# Patient Record
Sex: Female | Born: 1960
Health system: Southern US, Community
[De-identification: ages and names within clinical notes are randomized; demographics above are authoritative.]

## PROBLEM LIST (undated history)

## (undated) DIAGNOSIS — N200 Calculus of kidney: Secondary | ICD-10-CM

## (undated) DIAGNOSIS — B019 Varicella without complication: Secondary | ICD-10-CM

## (undated) DIAGNOSIS — E119 Type 2 diabetes mellitus without complications: Secondary | ICD-10-CM

## (undated) DIAGNOSIS — I999 Unspecified disorder of circulatory system: Secondary | ICD-10-CM

## (undated) DIAGNOSIS — D649 Anemia, unspecified: Secondary | ICD-10-CM

## (undated) DIAGNOSIS — N920 Excessive and frequent menstruation with regular cycle: Secondary | ICD-10-CM

## (undated) DIAGNOSIS — E7800 Pure hypercholesterolemia, unspecified: Secondary | ICD-10-CM

## (undated) DIAGNOSIS — Z22322 Carrier or suspected carrier of Methicillin resistant Staphylococcus aureus: Secondary | ICD-10-CM

## (undated) DIAGNOSIS — E611 Iron deficiency: Secondary | ICD-10-CM

## (undated) DIAGNOSIS — G4730 Sleep apnea, unspecified: Secondary | ICD-10-CM

## (undated) DIAGNOSIS — M549 Dorsalgia, unspecified: Secondary | ICD-10-CM

## (undated) DIAGNOSIS — I10 Essential (primary) hypertension: Secondary | ICD-10-CM

## (undated) DIAGNOSIS — IMO0002 Reserved for concepts with insufficient information to code with codable children: Secondary | ICD-10-CM

## (undated) DIAGNOSIS — N289 Disorder of kidney and ureter, unspecified: Secondary | ICD-10-CM

## (undated) DIAGNOSIS — R110 Nausea: Secondary | ICD-10-CM

## (undated) DIAGNOSIS — J40 Bronchitis, not specified as acute or chronic: Secondary | ICD-10-CM

## (undated) DIAGNOSIS — R1013 Epigastric pain: Secondary | ICD-10-CM

## (undated) DIAGNOSIS — M25569 Pain in unspecified knee: Secondary | ICD-10-CM

## (undated) DIAGNOSIS — R9431 Abnormal electrocardiogram [ECG] [EKG]: Secondary | ICD-10-CM

## (undated) DIAGNOSIS — G5600 Carpal tunnel syndrome, unspecified upper limb: Secondary | ICD-10-CM

## (undated) HISTORY — DX: Unspecified disorder of circulatory system: I99.9

## (undated) HISTORY — DX: Epigastric pain: R10.13

## (undated) HISTORY — DX: Sleep apnea, unspecified: G47.30

## (undated) HISTORY — DX: Nausea: R11.0

## (undated) HISTORY — PX: BREAST BIOPSY: SHX20

## (undated) HISTORY — DX: Reserved for concepts with insufficient information to code with codable children: IMO0002

## (undated) HISTORY — PX: ABDOMINAL HYSTERECTOMY: SHX81

## (undated) HISTORY — DX: Varicella without complication: B01.9

## (undated) HISTORY — DX: Disorder of kidney and ureter, unspecified: N28.9

## (undated) HISTORY — DX: Carrier or suspected carrier of methicillin resistant Staphylococcus aureus: Z22.322

## (undated) HISTORY — DX: Bronchitis, not specified as acute or chronic: J40

## (undated) HISTORY — PX: OTHER SURGICAL HISTORY: SHX169

## (undated) HISTORY — DX: Dorsalgia, unspecified: M54.9

## (undated) HISTORY — DX: Excessive and frequent menstruation with regular cycle: N92.0

## (undated) HISTORY — DX: Abnormal electrocardiogram (ECG) (EKG): R94.31

## (undated) HISTORY — DX: Carpal tunnel syndrome, unspecified upper limb: G56.00

## (undated) HISTORY — DX: Pain in unspecified knee: M25.569

## (undated) HISTORY — DX: Iron deficiency: E61.1

## (undated) HISTORY — DX: Type 2 diabetes mellitus without complications: E11.9

## (undated) HISTORY — DX: Essential (primary) hypertension: I10

## (undated) HISTORY — DX: Calculus of kidney: N20.0

## (undated) HISTORY — DX: Anemia, unspecified: D64.9

## (undated) HISTORY — DX: Pure hypercholesterolemia, unspecified: E78.00

---

## 1995-05-14 HISTORY — PX: LITHOTRIPSY: SUR834

## 1995-05-14 HISTORY — PX: COLONOSCOPY: SHX174

## 2004-04-27 ENCOUNTER — Emergency Department: Payer: Self-pay | Admitting: Emergency Medicine

## 2004-08-14 ENCOUNTER — Emergency Department: Payer: Self-pay | Admitting: Emergency Medicine

## 2004-10-22 ENCOUNTER — Emergency Department: Payer: Self-pay | Admitting: Emergency Medicine

## 2004-10-24 ENCOUNTER — Emergency Department: Payer: Self-pay | Admitting: Emergency Medicine

## 2005-11-02 ENCOUNTER — Emergency Department: Payer: Self-pay | Admitting: General Practice

## 2006-06-22 ENCOUNTER — Emergency Department: Payer: Self-pay | Admitting: Emergency Medicine

## 2007-01-30 ENCOUNTER — Other Ambulatory Visit: Payer: Self-pay

## 2007-01-30 ENCOUNTER — Emergency Department: Payer: Self-pay | Admitting: Emergency Medicine

## 2007-05-14 HISTORY — PX: PARTIAL HYSTERECTOMY: SHX80

## 2007-05-26 DIAGNOSIS — N200 Calculus of kidney: Secondary | ICD-10-CM | POA: Insufficient documentation

## 2007-06-17 DIAGNOSIS — D509 Iron deficiency anemia, unspecified: Secondary | ICD-10-CM | POA: Insufficient documentation

## 2007-12-29 ENCOUNTER — Emergency Department: Payer: Self-pay | Admitting: Emergency Medicine

## 2008-01-15 ENCOUNTER — Ambulatory Visit: Payer: Self-pay | Admitting: Family Medicine

## 2008-03-28 ENCOUNTER — Ambulatory Visit: Payer: Self-pay | Admitting: Family Medicine

## 2008-03-28 DIAGNOSIS — M25569 Pain in unspecified knee: Secondary | ICD-10-CM | POA: Insufficient documentation

## 2008-07-12 DIAGNOSIS — I10 Essential (primary) hypertension: Secondary | ICD-10-CM | POA: Insufficient documentation

## 2008-08-02 ENCOUNTER — Ambulatory Visit: Payer: Self-pay | Admitting: Obstetrics and Gynecology

## 2008-08-10 DIAGNOSIS — N399 Disorder of urinary system, unspecified: Secondary | ICD-10-CM | POA: Insufficient documentation

## 2008-08-16 ENCOUNTER — Ambulatory Visit: Payer: Self-pay | Admitting: Obstetrics and Gynecology

## 2008-08-21 ENCOUNTER — Inpatient Hospital Stay: Payer: Self-pay | Admitting: Surgery

## 2008-09-15 DIAGNOSIS — T8149XA Infection following a procedure, other surgical site, initial encounter: Secondary | ICD-10-CM | POA: Insufficient documentation

## 2008-09-15 HISTORY — DX: Infection following a procedure, other surgical site, initial encounter: T81.49XA

## 2008-09-23 ENCOUNTER — Ambulatory Visit: Payer: Self-pay | Admitting: Urology

## 2008-11-12 ENCOUNTER — Inpatient Hospital Stay: Payer: Self-pay | Admitting: Internal Medicine

## 2008-11-30 ENCOUNTER — Ambulatory Visit: Payer: Self-pay | Admitting: Urology

## 2008-12-01 ENCOUNTER — Inpatient Hospital Stay: Payer: Self-pay | Admitting: Surgery

## 2010-05-13 HISTORY — PX: HERNIA REPAIR: SHX51

## 2010-05-13 HISTORY — PX: COLOSTOMY TAKEDOWN: SHX5783

## 2010-11-11 ENCOUNTER — Emergency Department (HOSPITAL_COMMUNITY)
Admission: EM | Admit: 2010-11-11 | Discharge: 2010-11-11 | Disposition: A | Discharge status: Home / Self Care | Payer: BC Managed Care – PPO | Attending: Emergency Medicine | Admitting: Emergency Medicine

## 2010-11-11 ENCOUNTER — Emergency Department (HOSPITAL_COMMUNITY): Payer: BC Managed Care – PPO

## 2010-11-11 DIAGNOSIS — M25469 Effusion, unspecified knee: Secondary | ICD-10-CM | POA: Insufficient documentation

## 2010-11-11 DIAGNOSIS — M25569 Pain in unspecified knee: Secondary | ICD-10-CM | POA: Insufficient documentation

## 2010-11-11 DIAGNOSIS — M1710 Unilateral primary osteoarthritis, unspecified knee: Secondary | ICD-10-CM | POA: Insufficient documentation

## 2011-03-14 ENCOUNTER — Ambulatory Visit (INDEPENDENT_AMBULATORY_CARE_PROVIDER_SITE_OTHER): Payer: BC Managed Care – PPO | Admitting: Cardiovascular Disease

## 2011-03-14 VITALS — BP 152/94 | HR 83 | Ht 69.0 in | Wt 234.0 lb

## 2011-03-14 DIAGNOSIS — I10 Essential (primary) hypertension: Secondary | ICD-10-CM | POA: Insufficient documentation

## 2011-03-14 DIAGNOSIS — R9431 Abnormal electrocardiogram [ECG] [EKG]: Secondary | ICD-10-CM

## 2011-03-14 DIAGNOSIS — E669 Obesity, unspecified: Secondary | ICD-10-CM | POA: Insufficient documentation

## 2011-03-14 DIAGNOSIS — R002 Palpitations: Secondary | ICD-10-CM | POA: Insufficient documentation

## 2011-03-14 MED ORDER — AMLODIPINE BESYLATE 10 MG PO TABS: 10.0000 mg | ORAL_TABLET | Freq: Every day | ORAL | Status: DC

## 2011-03-14 NOTE — Assessment & Plan Note (Signed)
Heart rate is mildly elevated during today's visit on clinical exam. I suggested she start the metoprolol that has already been called in. This can be titrated upwards as needed for palpitations. I suspect she is having ectopy. If she continues to have palpitations despite being on beta blockers, I suggested we could do a Holter monitor.   She denies any significant shortness of breath or chest pain with exertion, we will hold off on any stress testing.

## 2011-03-14 NOTE — Progress Notes (Signed)
Patient ID: Emily Stokes, female    DOB: 15-Apr-1961, 50 y.o.   MRN: [id number]  HPI Comments: [name] is a 50 year old woman with history of mild obesity, hypertension who presents by referral from Dr. Oswaldo Done for symptoms of hypertension, palpitations.  She reports that over the past several weeks, she has had symptoms of palpitations and fluttering that is more common with exertion. The symptoms started prior to her developing bronchitis. It seems to happen more when she is eating and after she has eaten it happens on a daily basis. She also reports having a sensation that food is sticking in her throat though this is not all the time. Periodically, the food will stick and she has to wash it down with something to drink. This seems to make her palpitations worse.  She also reports having pain in her legs bilaterally in the thigh region during the daytime as well as night. She does have tightness in the left side of her neck into her shoulder that seems to happen at night time only.  She denies any significant chest pain, no edema, no lightheadedness. She reports that her blood pressure is typically very elevated, commonly in the 150-160 range.  EKG shows normal sinus rhythm with rate 83 beats per minute with no significant ST or T wave changes Previous EKG done at [facility] yesterday does show T wave abnormality in leads V4 through V6   Outpatient Encounter Prescriptions as of 03/14/2011  Medication Sig Dispense Refill  . metoprolol tartrate (LOPRESSOR) 25 MG tablet Take 25 mg by mouth 2 (two) times daily.    HAS NOT STARTED         Review of Systems  Constitutional: Negative.   HENT: Negative.   Eyes: Negative.   Respiratory: Negative.   Cardiovascular: Positive for palpitations.  Gastrointestinal: Negative.   Musculoskeletal: Negative.        Bilateral leg pain and left posterior shoulder discomfort at night  Skin: Negative.   Neurological: Negative.     Hematological: Negative.   Psychiatric/Behavioral: Negative.   All other systems reviewed and are negative.    BP 152/94  Pulse 83  Ht 5\' 9"  (1.753 m)  Wt 234 lb (106.142 kg)  BMI 34.56 kg/m2  Physical Exam  Nursing note and vitals reviewed. Constitutional: She is oriented to person, place, and time. She appears well-developed and well-nourished.  HENT:  Head: Normocephalic.  Nose: Nose normal.  Mouth/Throat: Oropharynx is clear and moist.  Eyes: Conjunctivae are normal. Pupils are equal, round, and reactive to light.  Neck: Normal range of motion. Neck supple. No JVD present.  Cardiovascular: Normal rate, regular rhythm, S1 normal, S2 normal, normal heart sounds and intact distal pulses.  Exam reveals no gallop and no friction rub.   No murmur heard. Pulmonary/Chest: Effort normal and breath sounds normal. No respiratory distress. She has no wheezes. She has no rales. She exhibits no tenderness.  Abdominal: Soft. Bowel sounds are normal. She exhibits no distension. There is no tenderness.  Musculoskeletal: Normal range of motion. She exhibits no edema and no tenderness.  Lymphadenopathy:    She has no cervical adenopathy.  Neurological: She is alert and oriented to person, place, and time. Coordination normal.  Skin: Skin is warm and dry. No rash noted. No erythema.  Psychiatric: She has a normal mood and affect. Her behavior is normal. Judgment and thought content normal.         Assessment and Plan

## 2011-03-14 NOTE — Assessment & Plan Note (Signed)
She has not picked up her metoprolol tartrate 25 mg b.i.d.. We have suggested she start this for blood pressure as well as palpitations. This was called in by Dr. Katrinka Blazing.  I suspect her blood pressure will continue to be very elevated and we will start her on amlodipine 5 mg for the first week, titrating up to 10 mg in week #2 if her systolic pressure remains greater than 140  Her high blood pressure could be contributing to some of her symptoms of palpitations.

## 2011-03-14 NOTE — Assessment & Plan Note (Signed)
It is very interesting that her lateral T wave abnormality has resolved on today's EKG. This could be secondary to lead placement. I suggest we monitor her closely and have a low threshold for a treadmill study.

## 2011-03-14 NOTE — Patient Instructions (Signed)
You are doing well.  Please start metoprolol tartrate twice a day Please start amlodipine 1/2 pill once a day Monitor your blood pressure If the top blood pressure number is more than 140 next week, Go to a full pill of amlodipine (10 mg daily)  Please call us if you have new issues that need to be addressed before your next appt.  The office will contact you for a follow up Appt. In 2 months

## 2011-03-14 NOTE — Assessment & Plan Note (Signed)
We have encouraged continued exercise, careful diet management in an effort to lose weight. She does have a family history of diabetes.

## 2011-04-11 ENCOUNTER — Ambulatory Visit: Payer: Self-pay | Admitting: Family Medicine

## 2011-05-15 ENCOUNTER — Telehealth: Payer: Self-pay | Admitting: *Deleted

## 2011-05-15 ENCOUNTER — Ambulatory Visit: Payer: BC Managed Care – PPO | Admitting: Cardiovascular Disease

## 2011-05-15 MED ORDER — METOPROLOL TARTRATE 25 MG PO TABS: 25.0000 mg | ORAL_TABLET | Freq: Two times a day (BID) | ORAL | Status: DC

## 2011-05-15 NOTE — Telephone Encounter (Signed)
Refill sent for metoprolol tartrate.

## 2011-07-03 ENCOUNTER — Encounter: Payer: Self-pay | Admitting: *Deleted

## 2011-10-30 DIAGNOSIS — IMO0002 Reserved for concepts with insufficient information to code with codable children: Secondary | ICD-10-CM | POA: Insufficient documentation

## 2011-10-30 DIAGNOSIS — N9489 Other specified conditions associated with female genital organs and menstrual cycle: Secondary | ICD-10-CM | POA: Insufficient documentation

## 2011-12-05 DIAGNOSIS — M25519 Pain in unspecified shoulder: Secondary | ICD-10-CM | POA: Insufficient documentation

## 2011-12-05 HISTORY — DX: Pain in unspecified shoulder: M25.519

## 2012-02-24 DIAGNOSIS — K439 Ventral hernia without obstruction or gangrene: Secondary | ICD-10-CM | POA: Insufficient documentation

## 2012-03-14 ENCOUNTER — Other Ambulatory Visit: Payer: Self-pay | Admitting: Cardiovascular Disease

## 2012-03-16 ENCOUNTER — Other Ambulatory Visit: Payer: Self-pay | Admitting: *Deleted

## 2012-03-16 MED ORDER — AMLODIPINE BESYLATE 10 MG PO TABS: 10.0000 mg | ORAL_TABLET | Freq: Every day | ORAL | Status: DC

## 2012-03-16 NOTE — Telephone Encounter (Signed)
Lmtcb pt is due for 1 yr follow up needs to schedule appointment. Refill sent in for Amlodipine R-1.

## 2012-04-25 ENCOUNTER — Other Ambulatory Visit: Payer: Self-pay | Admitting: Cardiovascular Disease

## 2012-04-27 ENCOUNTER — Telehealth: Payer: Self-pay | Admitting: *Deleted

## 2012-04-27 ENCOUNTER — Other Ambulatory Visit: Payer: Self-pay | Admitting: *Deleted

## 2012-04-27 ENCOUNTER — Encounter: Payer: Self-pay | Admitting: *Deleted

## 2012-04-27 MED ORDER — METOPROLOL TARTRATE 25 MG PO TABS: 25.0000 mg | ORAL_TABLET | Freq: Two times a day (BID) | ORAL | Status: DC

## 2012-04-27 NOTE — Telephone Encounter (Signed)
Called pt to inform pt that she is overdue for a 2 month follow up. Patient has not been seen since 2012 it has been over a year. Number listed is no longer patient's number. Sent pt letter to contact office to schedule appointment in order to continue to receive refills.

## 2012-07-27 ENCOUNTER — Telehealth: Payer: Self-pay | Admitting: *Deleted

## 2012-07-27 NOTE — Telephone Encounter (Signed)
Pharmacy requesting refill for metoprolol tartrate pt has not been seen since 2012 pt is overdue for f/u appointment. Pt number listed is disconnected to schedule appointment unable to reach pt. I contacted pt husband his number is disconnected as well. Pt needs to schedule appointment.

## 2012-10-29 ENCOUNTER — Ambulatory Visit: Payer: Self-pay | Admitting: Family Medicine

## 2013-06-30 ENCOUNTER — Encounter (HOSPITAL_COMMUNITY): Payer: Self-pay | Admitting: Emergency Medicine

## 2013-06-30 DIAGNOSIS — Z79899 Other long term (current) drug therapy: Secondary | ICD-10-CM | POA: Insufficient documentation

## 2013-06-30 DIAGNOSIS — Z87448 Personal history of other diseases of urinary system: Secondary | ICD-10-CM | POA: Insufficient documentation

## 2013-06-30 DIAGNOSIS — Z87442 Personal history of urinary calculi: Secondary | ICD-10-CM | POA: Insufficient documentation

## 2013-06-30 DIAGNOSIS — M79609 Pain in unspecified limb: Secondary | ICD-10-CM | POA: Insufficient documentation

## 2013-06-30 DIAGNOSIS — Z862 Personal history of diseases of the blood and blood-forming organs and certain disorders involving the immune mechanism: Secondary | ICD-10-CM | POA: Insufficient documentation

## 2013-06-30 DIAGNOSIS — Z8742 Personal history of other diseases of the female genital tract: Secondary | ICD-10-CM | POA: Insufficient documentation

## 2013-06-30 DIAGNOSIS — Z8669 Personal history of other diseases of the nervous system and sense organs: Secondary | ICD-10-CM | POA: Insufficient documentation

## 2013-06-30 DIAGNOSIS — Z8739 Personal history of other diseases of the musculoskeletal system and connective tissue: Secondary | ICD-10-CM | POA: Insufficient documentation

## 2013-06-30 DIAGNOSIS — Z8619 Personal history of other infectious and parasitic diseases: Secondary | ICD-10-CM | POA: Insufficient documentation

## 2013-06-30 DIAGNOSIS — Z7982 Long term (current) use of aspirin: Secondary | ICD-10-CM | POA: Insufficient documentation

## 2013-06-30 DIAGNOSIS — Z8709 Personal history of other diseases of the respiratory system: Secondary | ICD-10-CM | POA: Insufficient documentation

## 2013-06-30 DIAGNOSIS — Z8639 Personal history of other endocrine, nutritional and metabolic disease: Secondary | ICD-10-CM | POA: Insufficient documentation

## 2013-06-30 DIAGNOSIS — I10 Essential (primary) hypertension: Secondary | ICD-10-CM | POA: Insufficient documentation

## 2013-06-30 NOTE — ED Notes (Signed)
Patient presents stating she has abd surgery and ever since then her legs have been giving her trouble, told she would have trouble with them feeling painful.  Now her arms are stating to bother her.

## 2013-07-01 ENCOUNTER — Emergency Department (HOSPITAL_COMMUNITY)
Admission: EM | Admit: 2013-07-01 | Discharge: 2013-07-01 | Disposition: A | Discharge status: Home / Self Care | Payer: BC Managed Care – PPO | Attending: Emergency Medicine | Admitting: Emergency Medicine

## 2013-07-01 DIAGNOSIS — R52 Pain, unspecified: Secondary | ICD-10-CM

## 2013-07-01 MED ORDER — KETOROLAC TROMETHAMINE 60 MG/2ML IM SOLN
60.0000 mg | Freq: Once | INTRAMUSCULAR | Status: AC
  Administered 2013-07-01: 60 mg via INTRAMUSCULAR
  Filled 2013-07-01: qty 2

## 2013-07-01 MED ORDER — MELOXICAM 7.5 MG PO TABS: 7.5000 mg | ORAL_TABLET | Freq: Every day | ORAL | Status: DC

## 2013-07-01 MED ORDER — LIDOCAINE 5 % EX PTCH: 1.0000 | MEDICATED_PATCH | CUTANEOUS | Status: DC

## 2013-07-01 MED ORDER — TRAMADOL HCL 50 MG PO TABS
100.0000 mg | ORAL_TABLET | Freq: Once | ORAL | Status: AC
  Administered 2013-07-01: 100 mg via ORAL
  Filled 2013-07-01: qty 2

## 2013-07-01 NOTE — ED Provider Notes (Addendum)
CSN: [id number]     Arrival date & time 06/30/13  1927 History   First MD Initiated Contact with Patient 07/01/13 0035     Chief Complaint  Patient presents with  . Leg Pain  . Arm Pain     (Consider location/radiation/quality/duration/timing/severity/associated sxs/prior Treatment) Patient is a 53 y.o. female presenting with leg pain and arm pain. The history is provided by the patient.  Leg Pain Lower extremity pain location: all 4 extremities. Injury: no   Pain details:    Quality:  Aching   Radiates to:  Does not radiate   Severity:  Moderate   Onset quality:  Gradual   Timing:  Constant   Progression:  Unchanged Chronicity:  Chronic Dislocation: no   Worsened by:  Nothing tried Ineffective treatments:  None tried Associated symptoms: no back pain, no muscle weakness and no swelling   Risk factors: no concern for non-accidental trauma   Arm Pain  Chronic pain and sees pain management.    Past Medical History  Diagnosis Date  . Bronchitis   . Hypercholesteremia   . Chicken pox   . Measles   . Excess, menstruation   . Chest pain   . Calculus, kidney   . Anemia   . Iron deficiency   . Abdominal pain, epigastric   . Circulatory disease   . Backache   . Pain in joint, lower leg   . Carpal tunnel syndrome   . Hypertension, essential, benign   . Nausea alone   . Malaise and fatigue   . Disorder of kidney and ureter   . Infected postoperative seroma   . Nonspecific abnormal electrocardiogram (ECG) (EKG)    Past Surgical History  Procedure Laterality Date  . Hernia repair    . Colonoscopy  1997  . Partial hysterectomy  2009     vaginal hysterectomy, has both ovaries  . Cesarean section    . Lithotripsy  2993    with complications, hospitalized due to these complications for 3 months   Family History  Problem Relation Age of Onset  . Hypertension Father   . Stroke Father   . Diabetes Mother   . Hypertension Mother   . Diabetes Other     sibling  .  Diabetes Other     sibling  . Diabetes Other     sibling  . Hypertension Other     sibling  . Hypertension Other     sibling  . Hypertension Other     sibling   History  Substance Use Topics  . Smoking status: Never Smoker   . Smokeless tobacco: Never Used  . Alcohol Use: No   OB History   Grav Para Term Preterm Abortions TAB SAB Ect Mult Living   5    1  1         Review of Systems  Musculoskeletal: Negative for back pain.  All other systems reviewed and are negative.      Allergies  Review of patient's allergies indicates no known allergies.  Home Medications   Current Outpatient Rx  Name  Route  Sig  Dispense  Refill  . amLODipine (NORVASC) 10 MG tablet   Oral   Take 10 mg by mouth daily.         Marland Kitchen aspirin 81 MG tablet   Oral   Take 81 mg by mouth daily.         . metoprolol tartrate (LOPRESSOR) 25 MG tablet   Oral  Take 1 tablet (25 mg total) by mouth 2 (two) times daily.   60 tablet   1   . nitroGLYCERIN (NITROSTAT) 0.4 MG SL tablet   Sublingual   Place 0.4 mg under the tongue every 5 (five) minutes as needed.            BP 160/101  Pulse 70  Temp(Src) 98.3 F (36.8 C) (Oral)  Resp 18  Ht 5\' 9"  (1.753 m)  Wt 210 lb (95.255 kg)  BMI 31.00 kg/m2  SpO2 91% Physical Exam  Constitutional: She is oriented to person, place, and time. She appears well-developed and well-nourished.  HENT:  Head: Normocephalic and atraumatic.  Mouth/Throat: Oropharynx is clear and moist.  Eyes: Conjunctivae are normal. Pupils are equal, round, and reactive to light.  Neck: Neck supple.  Cardiovascular: Normal rate, regular rhythm and intact distal pulses.   Pulmonary/Chest: Effort normal and breath sounds normal. She has no wheezes. She has no rales.  Abdominal: Soft. Bowel sounds are normal. There is no tenderness. There is no rebound and no guarding.  Musculoskeletal: Normal range of motion. She exhibits no edema and no tenderness.  Neurological: She is  alert and oriented to person, place, and time. She has normal reflexes.  Skin: Skin is warm and dry.  Psychiatric: She has a normal mood and affect.    ED Course  Procedures (including critical care time) Labs Review Labs Reviewed - No data to display Imaging Review No results found.  EKG Interpretation   None       MDM   Final diagnoses:  None   All pain is chronic and she is a pain management patient she will need to follow up for ongoing care   Jessejames Steelman K Caspar Favila-Rasch, MD 07/01/13 0307  Carlisle Beers, MD 07/01/13 3646

## 2013-12-01 ENCOUNTER — Emergency Department (HOSPITAL_COMMUNITY)
Admission: EM | Admit: 2013-12-01 | Discharge: 2013-12-01 | Disposition: A | Discharge status: Home / Self Care | Payer: BC Managed Care – PPO | Attending: Emergency Medicine | Admitting: Emergency Medicine

## 2013-12-01 ENCOUNTER — Encounter (HOSPITAL_COMMUNITY): Payer: Self-pay | Admitting: Emergency Medicine

## 2013-12-01 DIAGNOSIS — M25512 Pain in left shoulder: Secondary | ICD-10-CM

## 2013-12-01 DIAGNOSIS — Z87442 Personal history of urinary calculi: Secondary | ICD-10-CM | POA: Insufficient documentation

## 2013-12-01 DIAGNOSIS — I10 Essential (primary) hypertension: Secondary | ICD-10-CM | POA: Insufficient documentation

## 2013-12-01 DIAGNOSIS — M542 Cervicalgia: Secondary | ICD-10-CM | POA: Insufficient documentation

## 2013-12-01 DIAGNOSIS — Z8669 Personal history of other diseases of the nervous system and sense organs: Secondary | ICD-10-CM | POA: Insufficient documentation

## 2013-12-01 DIAGNOSIS — M79609 Pain in unspecified limb: Secondary | ICD-10-CM | POA: Insufficient documentation

## 2013-12-01 DIAGNOSIS — Z8709 Personal history of other diseases of the respiratory system: Secondary | ICD-10-CM | POA: Insufficient documentation

## 2013-12-01 DIAGNOSIS — M79605 Pain in left leg: Secondary | ICD-10-CM

## 2013-12-01 DIAGNOSIS — M25559 Pain in unspecified hip: Secondary | ICD-10-CM | POA: Insufficient documentation

## 2013-12-01 DIAGNOSIS — Z8619 Personal history of other infectious and parasitic diseases: Secondary | ICD-10-CM | POA: Insufficient documentation

## 2013-12-01 DIAGNOSIS — Z7982 Long term (current) use of aspirin: Secondary | ICD-10-CM | POA: Insufficient documentation

## 2013-12-01 DIAGNOSIS — Z8742 Personal history of other diseases of the female genital tract: Secondary | ICD-10-CM | POA: Insufficient documentation

## 2013-12-01 DIAGNOSIS — Z79899 Other long term (current) drug therapy: Secondary | ICD-10-CM | POA: Insufficient documentation

## 2013-12-01 DIAGNOSIS — G8929 Other chronic pain: Secondary | ICD-10-CM | POA: Insufficient documentation

## 2013-12-01 DIAGNOSIS — Z791 Long term (current) use of non-steroidal anti-inflammatories (NSAID): Secondary | ICD-10-CM | POA: Insufficient documentation

## 2013-12-01 DIAGNOSIS — M79602 Pain in left arm: Secondary | ICD-10-CM

## 2013-12-01 DIAGNOSIS — Z862 Personal history of diseases of the blood and blood-forming organs and certain disorders involving the immune mechanism: Secondary | ICD-10-CM | POA: Insufficient documentation

## 2013-12-01 DIAGNOSIS — M25519 Pain in unspecified shoulder: Secondary | ICD-10-CM | POA: Insufficient documentation

## 2013-12-01 MED ORDER — PREDNISONE 10 MG PO TABS: ORAL_TABLET | ORAL | Status: DC

## 2013-12-01 MED ORDER — OXYCODONE-ACETAMINOPHEN 5-325 MG PO TABS
1.0000 | ORAL_TABLET | Freq: Once | ORAL | Status: AC
  Administered 2013-12-01: 1 via ORAL
  Filled 2013-12-01: qty 1

## 2013-12-01 NOTE — ED Notes (Signed)
Courtney, PA-C, at the bedside. 

## 2013-12-01 NOTE — ED Notes (Signed)
The patient has been having stabbing pains to both legs and her left arm.  She describes the pain in her left leg as stabbing and her left arm as a toothache.  She also say her right leg hurts but it is not as bad as her left.  She said she has been in and out of the hospital since she had hysterectomy.  Ever since the surgery she has had multiple problems including bilateral leg pain.  This episode started in June and it has gotten worse.  She is here to be evaluated.

## 2013-12-01 NOTE — ED Provider Notes (Signed)
CSN: [id number]     Arrival date & time 12/01/13  1834 History   This chart was scribed for Starlyn Skeans, PA-C working with Orlie Dakin, MD by Randa Evens, ED Scribe. This patient was seen in room TR10C/TR10C and the patient's care was started at 8:54 PM.    Chief Complaint  Patient presents with  . Leg Pain    The patient has been having stabbing pains to both legs and her left arm.  She describes the pain in her left leg as stabbing and her left arm as a toothache.  She also say her right leg hurts but it is not as bad as her left.  . Arm Pain   Patient is a 53 y.o. female presenting with leg pain and arm pain. The history is provided by the patient. No language interpreter was used.  Leg Pain Associated symptoms: neck pain   Associated symptoms: no fever   Arm Pain Pertinent negatives include no chest pain and no shortness of breath.  Arm Pain Associated symptoms include arthralgias and neck pain. Pertinent negatives include no chest pain, chills, fever, joint swelling, nausea or vomiting.   HPI Comments: Emily Stokes is a 53 y.o. female who presents to the Emergency Department complaining of chronic left sided leg pain that recently worsened over the past 2 weeks. She state that she normally goes to the pain clinic at [facility]. She states that has been taking ibuprofen with no relief. She states that nothing improves the pain. She states she is also having arm pain and neck pain for "a while". She states that she sometimes feels numbness in her fingers. She describes her pain as aching in her arm.  She denies fever, chills, nausea, vomiting, chest pain, leg swelling, changes in bowel/bladder habits.   Past Medical History  Diagnosis Date  . Bronchitis   . Hypercholesteremia   . Chicken pox   . Measles   . Excess, menstruation   . Chest pain   . Calculus, kidney   . Anemia   . Iron deficiency   . Abdominal pain, epigastric   . Circulatory disease   . Backache   . Pain  in joint, lower leg   . Carpal tunnel syndrome   . Hypertension, essential, benign   . Nausea alone   . Malaise and fatigue   . Disorder of kidney and ureter   . Infected postoperative seroma   . Nonspecific abnormal electrocardiogram (ECG) (EKG)    Past Surgical History  Procedure Laterality Date  . Hernia repair    . Colonoscopy  1997  . Partial hysterectomy  2009     vaginal hysterectomy, has both ovaries  . Cesarean section    . Lithotripsy  5329    with complications, hospitalized due to these complications for 3 months   Family History  Problem Relation Age of Onset  . Hypertension Father   . Stroke Father   . Diabetes Mother   . Hypertension Mother   . Diabetes Other     sibling  . Diabetes Other     sibling  . Diabetes Other     sibling  . Hypertension Other     sibling  . Hypertension Other     sibling  . Hypertension Other     sibling   History  Substance Use Topics  . Smoking status: Never Smoker   . Smokeless tobacco: Never Used  . Alcohol Use: No   OB History   Grav  Para Term Preterm Abortions TAB SAB Ect Mult Living   5    1  1         Review of Systems  Constitutional: Negative for fever and chills.  Respiratory: Negative for chest tightness and shortness of breath.   Cardiovascular: Negative for chest pain and leg swelling.  Gastrointestinal: Negative for nausea and vomiting.  Genitourinary: Negative for dysuria, urgency, frequency, hematuria and difficulty urinating.  Musculoskeletal: Positive for arthralgias, gait problem and neck pain. Negative for joint swelling.  Skin: Negative for color change.  All other systems reviewed and are negative.  Allergies  Review of patient's allergies indicates no known allergies.  Home Medications   Prior to Admission medications   Medication Sig Start Date End Date Taking? Authorizing Provider  amLODipine (NORVASC) 10 MG tablet Take 10 mg by mouth daily.    Historical Provider, MD  aspirin 81 MG  tablet Take 81 mg by mouth daily.    Historical Provider, MD  lidocaine (LIDODERM) 5 % Place 1 patch onto the skin daily. Remove & Discard patch within 12 hours or as directed by MD 07/01/13   April K Palumbo-Rasch, MD  meloxicam (MOBIC) 7.5 MG tablet Take 1 tablet (7.5 mg total) by mouth daily. 07/01/13   April K Palumbo-Rasch, MD  metoprolol tartrate (LOPRESSOR) 25 MG tablet Take 1 tablet (25 mg total) by mouth 2 (two) times daily. 04/27/12   Minna Merritts, MD  nitroGLYCERIN (NITROSTAT) 0.4 MG SL tablet Place 0.4 mg under the tongue every 5 (five) minutes as needed.      Historical Provider, MD  predniSONE (DELTASONE) 10 MG tablet Day 1 take 6 pills Day 2 take 5 pills Day 3 take 4 pills Day 4 take 3 pills Day 5 take 2 pills Day 6 take 1 pill 12/01/13   Jhamal Plucinski A Forcucci, PA-C   Triage Vitals: BP 174/100  Pulse 85  Temp(Src) 98.1 F (36.7 C) (Oral)  Resp 17  Ht 5\' 9"  (1.753 m)  Wt 233 lb (105.688 kg)  BMI 34.39 kg/m2  SpO2 96%  Physical Exam  Nursing note and vitals reviewed. Constitutional: She is oriented to person, place, and time. She appears well-developed and well-nourished. No distress.  HENT:  Head: Normocephalic and atraumatic.  Eyes: Conjunctivae and EOM are normal. Pupils are equal, round, and reactive to light.  Neck: Normal range of motion. Neck supple. No tracheal deviation present. No thyromegaly present.  Cardiovascular: Normal rate, regular rhythm, normal heart sounds and intact distal pulses.  Exam reveals no gallop and no friction rub.   No murmur heard. Pulmonary/Chest: Effort normal and breath sounds normal. No respiratory distress. She has no wheezes. She has no rales.  Abdominal: Soft. Bowel sounds are normal.  Musculoskeletal:       Left shoulder: She exhibits decreased range of motion, tenderness and pain. She exhibits no bony tenderness, no swelling, no effusion, no crepitus, no deformity, no laceration, no spasm, normal pulse and normal strength.        Left hip: She exhibits decreased range of motion, decreased strength, tenderness and bony tenderness. She exhibits no swelling, no crepitus, no deformity and no laceration.  Lymphadenopathy:    She has no cervical adenopathy.  Neurological: She is alert and oriented to person, place, and time. She has normal strength. No cranial nerve deficit or sensory deficit. Coordination normal.  Skin: Skin is warm and dry.  Psychiatric: She has a normal mood and affect. Her behavior is normal.  ED Course  Procedures (including critical care time) DIAGNOSTIC STUDIES: Oxygen Saturation is 96% on RA, normal by my interpretation.    COORDINATION OF CARE:      Labs Review Labs Reviewed - No data to display  Imaging Review No results found.   EKG Interpretation None      MDM   Final diagnoses:  Left shoulder pain  Chronic leg pain, left   Patient is a 53 y.o. Chronic pain patient who presents with left hip pain which is chronic and new onset neck and shoulder pain.  There is no concern for cauda equina at this time.  Patient does have some rotator cuff weakness and given tingling and numbness think that this may be rotator cuff arthropathy vs. Cervical rediculopathy.  Will treat the patient here with percocet and told her given that she is a chronic pain patient that she cannot have pain medication to go home with.  Patient will be given 6 day prednisone taper for shoulder and neck pain.  Patient will be given referral to pain clinic at this time.  Patient is to follow-up with her PCP.    I personally performed the services described in this documentation, which was scribed in my presence. The recorded information has been reviewed and is accurate.      Cherylann Parr, PA-C 12/01/13 2123

## 2013-12-01 NOTE — Discharge Instructions (Signed)
Rotator Cuff Tendinitis  Rotator cuff tendinitis is inflammation of the tough, cord-like bands that connect muscle to bone (tendons) in your rotator cuff. Your rotator cuff is the collection of all the muscles and tendons that connect your arm to your shoulder. Your rotator cuff holds the head of your upper arm bone (humerus) in the cup (fossa) of your shoulder blade (scapula). CAUSES Rotator cuff tendinitis is usually caused by overusing the joint involved.  SIGNS AND SYMPTOMS  Deep ache in the shoulder also felt on the outside upper arm over the shoulder muscle.  Point tenderness over the area that is injured.  Pain comes on gradually and becomes worse with lifting the arm to the side (abduction) or turning it inward (internal rotation).  May lead to a chronic tear: When a rotator cuff tendon becomes inflamed, it runs the risk of losing its blood supply, causing some tendon fibers to die. This increases the risk that the tendon can fray and partially or completely tear. DIAGNOSIS Rotator cuff tendinitis is diagnosed by taking a medical history, performing a physical exam, and reviewing results of imaging exams. The medical history is useful to help determine the type of rotator cuff injury. The physical exam will include looking at the injured shoulder, feeling the injured area, and watching you do range-of-motion exercises. X-ray exams are typically done to rule out other causes of shoulder pain, such as fractures. MRI is the imaging exam usually used for significant shoulder injuries. Sometimes a dye study called CT arthrogram is done, but it is not as widely used as MRI. In some institutions, special ultrasound tests may also be used to aid in the diagnosis. TREATMENT  Less Severe Cases  Use of a sling to rest the shoulder for a short period of time. Prolonged use of the sling can cause stiffness, weakness, and loss of motion of the shoulder joint.  Anti-inflammatory medicines, such as  ibuprofen or naproxen sodium, may be prescribed. More Severe Cases  Physical therapy.  Use of steroid injections into the shoulder joint.  Surgery. HOME CARE INSTRUCTIONS   Use a sling or splint until the pain decreases. Prolonged use of the sling can cause stiffness, weakness, and loss of motion of the shoulder joint.  Apply ice to the injured area:  Put ice in a plastic bag.  Place a towel between your skin and the bag.  Leave the ice on for 20 minutes, 2-3 times a day.  Try to avoid use other than gentle range of motion while your shoulder is painful. Use the shoulder and exercise only as directed by your health care provider. Stop exercises or range of motion if pain or discomfort increases, unless directed otherwise by your health care provider.  Only take over-the-counter or prescription medicines for pain, discomfort, or fever as directed by your health care provider.  If you were given a shoulder sling and straps (immobilizer), do not remove it except as directed, or until you see a health care provider for a follow-up exam. If you need to remove it, move your arm as little as possible or as directed.  You may want to sleep on several pillows at night to lessen swelling and pain. SEEK IMMEDIATE MEDICAL CARE IF:   Your shoulder pain increases or new pain develops in your arm, hand, or fingers and is not relieved with medicines.  You have new, unexplained symptoms, especially increased numbness in the hands or loss of strength.  You develop any worsening of the problems  that brought you in for care.  Your arm, hand, or fingers are numb or tingling.  Your arm, hand, or fingers are swollen, painful, or turn white or blue. MAKE SURE YOU:  Understand these instructions.  Will watch your condition.  Will get help right away if you are not doing well or get worse. Document Released: 07/20/2003 Document Revised: 02/17/2013 Document Reviewed: 12/09/2012 [employer] Patient  Information 2015 [employer]. This information is not intended to replace advice given to you by your health care provider. Make sure you discuss any questions you have with your health care provider.  

## 2013-12-02 NOTE — ED Provider Notes (Signed)
Medical screening examination/treatment/procedure(s) were performed by non-physician practitioner and as supervising physician I was immediately available for consultation/collaboration.   EKG Interpretation None       Orlie Dakin, MD 12/02/13 [phone]

## 2014-03-14 ENCOUNTER — Encounter (HOSPITAL_COMMUNITY): Payer: Self-pay | Admitting: Emergency Medicine

## 2014-03-25 ENCOUNTER — Emergency Department: Payer: Self-pay | Admitting: Emergency Medicine

## 2014-03-25 LAB — URINALYSIS, COMPLETE
Bilirubin,UR: NEGATIVE
Glucose,UR: NEGATIVE mg/dL (ref 0–75)
Ketone: NEGATIVE
Leukocyte Esterase: NEGATIVE
Nitrite: NEGATIVE
Ph: 5 (ref 4.5–8.0)
Protein: NEGATIVE
Specific Gravity: 1.015 (ref 1.003–1.030)

## 2014-03-25 LAB — COMPREHENSIVE METABOLIC PANEL
Albumin: 4 g/dL (ref 3.4–5.0)
Alkaline Phosphatase: 142 U/L — ABNORMAL HIGH
Anion Gap: 7 (ref 7–16)
BUN: 16 mg/dL (ref 7–18)
Bilirubin,Total: 0.5 mg/dL (ref 0.2–1.0)
Calcium, Total: 9 mg/dL (ref 8.5–10.1)
Chloride: 107 mmol/L (ref 98–107)
Co2: 29 mmol/L (ref 21–32)
Creatinine: 1.16 mg/dL (ref 0.60–1.30)
EGFR (African American): >60
EGFR (Non-African Amer.): 52 — ABNORMAL LOW
Glucose: 112 mg/dL — ABNORMAL HIGH (ref 65–99)
Osmolality: 287 (ref 275–301)
Potassium: 3.8 mmol/L (ref 3.5–5.1)
SGOT(AST): 23 U/L (ref 15–37)
SGPT (ALT): 26 U/L
Sodium: 143 mmol/L (ref 136–145)
Total Protein: 8.7 g/dL — ABNORMAL HIGH (ref 6.4–8.2)

## 2014-03-25 LAB — CBC
HCT: 44.1 % (ref 35.0–47.0)
HGB: 14.5 g/dL (ref 12.0–16.0)
MCH: 32.1 pg (ref 26.0–34.0)
MCHC: 32.8 g/dL (ref 32.0–36.0)
MCV: 98 fL (ref 80–100)
Platelet: 290 10*3/uL (ref 150–440)
RBC: 4.5 10*6/uL (ref 3.80–5.20)
RDW: 13.3 % (ref 11.5–14.5)
WBC: 6.5 10*3/uL (ref 3.6–11.0)

## 2014-08-02 ENCOUNTER — Emergency Department: Payer: Self-pay | Admitting: Emergency Medicine

## 2015-02-02 ENCOUNTER — Other Ambulatory Visit: Payer: Self-pay

## 2015-02-02 ENCOUNTER — Encounter: Payer: Self-pay | Admitting: Physician Assistant

## 2015-02-02 ENCOUNTER — Telehealth: Payer: Self-pay

## 2015-02-02 ENCOUNTER — Ambulatory Visit (INDEPENDENT_AMBULATORY_CARE_PROVIDER_SITE_OTHER): Payer: Self-pay | Admitting: Physician Assistant

## 2015-02-02 VITALS — BP 150/90 | HR 80 | Temp 98.4°F | Resp 16 | Wt 252.6 lb

## 2015-02-02 DIAGNOSIS — M79602 Pain in left arm: Secondary | ICD-10-CM

## 2015-02-02 DIAGNOSIS — E7800 Pure hypercholesterolemia, unspecified: Secondary | ICD-10-CM | POA: Insufficient documentation

## 2015-02-02 DIAGNOSIS — Z8619 Personal history of other infectious and parasitic diseases: Secondary | ICD-10-CM | POA: Insufficient documentation

## 2015-02-02 DIAGNOSIS — M79606 Pain in leg, unspecified: Secondary | ICD-10-CM

## 2015-02-02 DIAGNOSIS — M79609 Pain in unspecified limb: Secondary | ICD-10-CM | POA: Insufficient documentation

## 2015-02-02 DIAGNOSIS — R9431 Abnormal electrocardiogram [ECG] [EKG]: Secondary | ICD-10-CM | POA: Insufficient documentation

## 2015-02-02 DIAGNOSIS — R0683 Snoring: Secondary | ICD-10-CM | POA: Insufficient documentation

## 2015-02-02 DIAGNOSIS — I10 Essential (primary) hypertension: Secondary | ICD-10-CM

## 2015-02-02 DIAGNOSIS — R002 Palpitations: Secondary | ICD-10-CM | POA: Insufficient documentation

## 2015-02-02 DIAGNOSIS — R739 Hyperglycemia, unspecified: Secondary | ICD-10-CM | POA: Insufficient documentation

## 2015-02-02 HISTORY — DX: Pain in leg, unspecified: M79.606

## 2015-02-02 MED ORDER — METOPROLOL TARTRATE 25 MG PO TABS: 25.0000 mg | ORAL_TABLET | Freq: Two times a day (BID) | ORAL | Status: DC

## 2015-02-02 MED ORDER — AMLODIPINE BESYLATE 10 MG PO TABS: 10.0000 mg | ORAL_TABLET | Freq: Every day | ORAL | Status: DC

## 2015-02-02 MED ORDER — LIDOCAINE 5 % EX PTCH: 1.0000 | MEDICATED_PATCH | CUTANEOUS | Status: DC

## 2015-02-02 NOTE — Progress Notes (Signed)
Patient: Emily Stokes Female    DOB: Nov 16, 1960   54 y.o.   MRN: [id number] Visit Date: 02/02/2015  Today's Provider: Mar Daring, PA-C   Chief Complaint  Patient presents with  . Hypertension  . Leg Pain  . Arm Pain   Subjective:    Hypertension This is a chronic problem. The current episode started more than 1 year ago. The problem has been gradually worsening since onset. The problem is uncontrolled (Doesn't have no more medicine). Associated symptoms include malaise/fatigue. Pertinent negatives include no anxiety, blurred vision, chest pain, headaches, neck pain, orthopnea, palpitations, peripheral edema, PND, shortness of breath or sweats.  Leg Pain  The incident occurred more than 1 week ago. Incident location: since surgery 2009. The pain is present in the left leg. The quality of the pain is described as aching, burning, cramping, shooting and stabbing. The pain is at a severity of 9/10 (Even with the pain medicine). The pain is moderate (moderate to severe). The pain has been constant since onset. Associated symptoms include an inability to bear weight, muscle weakness and numbness (left arm; not new). The symptoms are aggravated by movement and weight bearing. The treatment provided no relief (Patient used to get pain mangement through [facility], but got transfer here a few months ago but has not schedule any appointment. ).  Arm Pain  The pain is present in the left forearm. The quality of the pain is described as aching, burning, cramping, shooting and stabbing. The pain radiates to the back. The pain is at a severity of 9/10 (only at night when laying down). Associated symptoms include muscle weakness and numbness (left arm; not new). Pertinent negatives include no chest pain.  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  She has since been told she is also developing arthritis in her left hip but she has not  seen any specialty for this.  She also states she is starting to develop lumbar back pain as well secondary to compensation for the left leg as well as sedentary life.  The arm pain is not new either.  It has been present for approximately 2-3 years per patient.  She also has suprapubic, lower abdominal pain due to having a few hernias.  She states she has seen a Psychologist, sport and exercise but he did not want to perform surgery to repair them unless they become strangulated due to her being high risk surgical candidate.  She has previously been seen by pain clinics at [facility] and most recently [place].  She states she has not had success with them.  She states she was released by [facility] to come to a pain clinic in [facility], but she cannot recall the name of the clinic.  She never set up an appointment with the new clinic.  She states she frequently visits ERs for pain control.  She most recently went to [facility] ER and was given oxycodone 5mg  IR.  She states she still has some of these remaining.  She is out of her lidoderm patches.  These do not relief the pain, but allow her to be able to sleep.       No Known Allergies Previous Medications   AMLODIPINE (NORVASC) 10 MG TABLET    Take 10 mg by mouth daily.   ASPIRIN 81 MG TABLET    Take 81 mg by mouth daily.   GABAPENTIN (NEURONTIN) 300 MG  CAPSULE    Take by mouth.   IBUPROFEN (ADVIL,MOTRIN) 200 MG TABLET    Take by mouth.   LIDOCAINE (LIDODERM) 5 %    Place 1 patch onto the skin daily. Remove & Discard patch within 12 hours or as directed by MD   METOPROLOL TARTRATE (LOPRESSOR) 25 MG TABLET    Take 1 tablet (25 mg total) by mouth 2 (two) times daily.   OXYCODONE (OXY-IR) 5 MG CAPSULE    0.5-1 tablet by mouth every 6 hours as needed for pain   TAPENTADOL (NUCYNTA) 50 MG TABS TABLET    Take by mouth.    Review of Systems  Constitutional: Positive for malaise/fatigue.  HENT: Negative.   Eyes: Negative.  Negative for blurred vision.  Respiratory: Negative.   Negative for chest tightness and shortness of breath.   Cardiovascular: Positive for leg swelling (left leg; chronic from surgery in 2009). Negative for chest pain, palpitations, orthopnea and PND.  Gastrointestinal: Negative.   Endocrine: Negative.   Genitourinary: Negative.   Musculoskeletal: Positive for arthralgias (chronic). Negative for neck pain.  Skin: Negative.   Allergic/Immunologic: Negative.   Neurological: Positive for numbness (left arm; not new). Negative for headaches.  Hematological: Negative.   Psychiatric/Behavioral: Negative.     Social History  Substance Use Topics  . Smoking status: Never Smoker   . Smokeless tobacco: Never Used  . Alcohol Use: No   Objective:   BP 150/90 mmHg  Pulse 80  Temp(Src) 98.4 F (36.9 C) (Oral)  Resp 16  Wt 252 lb 9.6 oz (114.579 kg)  Physical Exam  Constitutional: She is oriented to person, place, and time. She appears well-developed and well-nourished. No distress.  Cardiovascular: Normal rate, regular rhythm and normal heart sounds.  Exam reveals no gallop and no friction rub.   No murmur heard. Pulmonary/Chest: Effort normal and breath sounds normal. No respiratory distress. She has no wheezes. She has no rales.  Musculoskeletal: She exhibits edema (L LE edema is no worse than her baseline per patient today).  Neurological: She is alert and oriented to person, place, and time.  Skin: She is not diaphoretic.  Psychiatric: She has a normal mood and affect. Her behavior is normal. Judgment and thought content normal.  Vitals reviewed.       Assessment & Plan:     1. Pain of lower extremity, unspecified laterality Chronic.  Mostly in left hip and LLQ.  I will refill her lidoderm patches once.  I did advise her to schedule a f/u in 2-4 weeks with [name], PA-C, her PCP for further evaluation.  I also advised her to bring the papers from [place] about the pain clinic they were wanting her to start going to.  I did advise  her of a recent pain clinic closure and wanted to make sure that was not where they were trying to send her, and if so we could refer her to another clinic.  I do feel she would most benefit from pain management. - lidocaine (LIDODERM) 5 %; Place 1 patch onto the skin daily. Remove & Discard patch within 12 hours or as directed by MD  Dispense: 30 patch; Refill: 0  2. Essential hypertension She has been out of her BP medications for a while now.  Upon looking at ER notes her BP has been as high as high160s over 100s.  The ER always felt this was most likely elevated secondary to her chronic pain.  Today her BP is still elevated  but not to those levels.  She is having no adverse effects due to her high BP.  I will refill her BP medications she was previously on as below.  She is to f/u with [name], PA-C in 4 weeks to check her BP. - metoprolol tartrate (LOPRESSOR) 25 MG tablet; Take 1 tablet (25 mg total) by mouth 2 (two) times daily.  Dispense: 60 tablet; Refill: 6 - amLODipine (NORVASC) 10 MG tablet; Take 1 tablet (10 mg total) by mouth daily.  Dispense: 30 tablet; Refill: 6  3. Pain of left upper extremity See above medical treatment plan for pain in lower extremity.       Mar Daring, PA-C  [facility]

## 2015-02-02 NOTE — Patient Instructions (Signed)
Hypertension Hypertension, commonly called high blood pressure, is when the force of blood pumping through your arteries is too strong. Your arteries are the blood vessels that carry blood from your heart throughout your body. A blood pressure reading consists of a higher number over a lower number, such as 110/72. The higher number (systolic) is the pressure inside your arteries when your heart pumps. The lower number (diastolic) is the pressure inside your arteries when your heart relaxes. Ideally you want your blood pressure below 120/80. Hypertension forces your heart to work harder to pump blood. Your arteries may become narrow or stiff. Having hypertension puts you at risk for heart disease, stroke, and other problems.  RISK FACTORS Some risk factors for high blood pressure are controllable. Others are not.  Risk factors you cannot control include:   Race. You may be at higher risk if you are African American.  Age. Risk increases with age.  Gender. Men are at higher risk than women before age 45 years. After age 65, women are at higher risk than men. Risk factors you can control include:  Not getting enough exercise or physical activity.  Being overweight.  Getting too much fat, sugar, calories, or salt in your diet.  Drinking too much alcohol. SIGNS AND SYMPTOMS Hypertension does not usually cause signs or symptoms. Extremely high blood pressure (hypertensive crisis) may cause headache, anxiety, shortness of breath, and nosebleed. DIAGNOSIS  To check if you have hypertension, your health care provider will measure your blood pressure while you are seated, with your arm held at the level of your heart. It should be measured at least twice using the same arm. Certain conditions can cause a difference in blood pressure between your right and left arms. A blood pressure reading that is higher than normal on one occasion does not mean that you need treatment. If one blood pressure reading  is high, ask your health care provider about having it checked again. TREATMENT  Treating high blood pressure includes making lifestyle changes and possibly taking medicine. Living a healthy lifestyle can help lower high blood pressure. You may need to change some of your habits. Lifestyle changes may include:  Following the DASH diet. This diet is high in fruits, vegetables, and whole grains. It is low in salt, red meat, and added sugars.  Getting at least 2 hours of brisk physical activity every week.  Losing weight if necessary.  Not smoking.  Limiting alcoholic beverages.  Learning ways to reduce stress. If lifestyle changes are not enough to get your blood pressure under control, your health care provider may prescribe medicine. You may need to take more than one. Work closely with your health care provider to understand the risks and benefits. HOME CARE INSTRUCTIONS  Have your blood pressure rechecked as directed by your health care provider.   Take medicines only as directed by your health care provider. Follow the directions carefully. Blood pressure medicines must be taken as prescribed. The medicine does not work as well when you skip doses. Skipping doses also puts you at risk for problems.   Do not smoke.   Monitor your blood pressure at home as directed by your health care provider. SEEK MEDICAL CARE IF:   You think you are having a reaction to medicines taken.  You have recurrent headaches or feel dizzy.  You have swelling in your ankles.  You have trouble with your vision. SEEK IMMEDIATE MEDICAL CARE IF:  You develop a severe headache or confusion.    You have unusual weakness, numbness, or feel faint.  You have severe chest or abdominal pain.  You vomit repeatedly.  You have trouble breathing. MAKE SURE YOU:   Understand these instructions.  Will watch your condition.  Will get help right away if you are not doing well or get worse. Document  Released: 04/29/2005 Document Revised: 09/13/2013 Document Reviewed: 02/19/2013 [facility] Patient Information 2015 [place], [place]. This information is not intended to replace advice given to you by your health care provider. Make sure you discuss any questions you have with your health care provider. Ventral Hernia A ventral hernia (also called an incisional hernia) is a hernia that occurs at the site of a previous surgical cut (incision) in the abdomen. The abdominal wall spans from your lower chest down to your pelvis. If the abdominal wall is weakened from a surgical incision, a hernia can occur. A hernia is a bulge of bowel or muscle tissue pushing out on the weakened part of the abdominal wall. Ventral hernias can get bigger from straining or lifting. Obese and older people are at higher risk for a ventral hernia. People who develop infections after surgery or require repeat incisions at the same site on the abdomen are also at increased risk. CAUSES  A ventral hernia occurs because of weakness in the abdominal wall at an incision site.  SYMPTOMS  Common symptoms include:  A visible bulge or lump on the abdominal wall.  Pain or tenderness around the lump.  Increased discomfort if you cough or make a sudden movement. If the hernia has blocked part of the intestine, a serious complication can occur (incarcerated or strangulated hernia). This can become a problem that requires emergency surgery because the blood flow to the blocked intestine may be cut off. Symptoms may include:  Feeling sick to your stomach (nauseous).  Throwing up (vomiting).  Stomach swelling (distention) or bloating.  Fever.  Rapid heartbeat. DIAGNOSIS  Your health care provider will take a medical history and perform a physical exam. Various tests may be ordered, such as:  Blood tests.  Urine tests.  Ultrasonography.  X-rays.  Computed tomography (CT). TREATMENT  Watchful waiting may be all that is  needed for a smaller hernia that does not cause symptoms. Your health care provider may recommend the use of a supportive belt (truss) that helps to keep the abdominal wall intact. For larger hernias or those that cause pain, surgery to repair the hernia is usually recommended. If a hernia becomes strangulated, emergency surgery needs to be done right away. HOME CARE INSTRUCTIONS  Avoid putting pressure or strain on the abdominal area.  Avoid heavy lifting.  Use good body positioning for physical tasks. Ask your health care provider about proper body positioning.  Use a supportive belt as directed by your health care provider.  Maintain a healthy weight.  Eat foods that are high in fiber, such as whole grains, fruits, and vegetables. Fiber helps prevent difficult bowel movements (constipation).  Drink enough fluids to keep your urine clear or pale yellow.  Follow up with your health care provider as directed. SEEK MEDICAL CARE IF:   Your hernia seems to be getting larger or more painful. SEEK IMMEDIATE MEDICAL CARE IF:   You have abdominal pain that is sudden and sharp.  Your pain becomes severe.  You have repeated vomiting.  You are sweating a lot.  You notice a rapid heartbeat.  You develop a fever. MAKE SURE YOU:   Understand these instructions.  Will  watch your condition.  Will get help right away if you are not doing well or get worse. Document Released: 04/15/2012 Document Revised: 09/13/2013 Document Reviewed: 04/15/2012 [facility] Patient Information 2015 [place], [place]. This information is not intended to replace advice given to you by your health care provider. Make sure you discuss any questions you have with your health care provider. Chronic Pain Chronic pain can be defined as pain that is off and on and lasts for 3-6 months or longer. Many things cause chronic pain, which can make it difficult to make a diagnosis. There are many treatment options available for  chronic pain. However, finding a treatment that works well for you may require trying various approaches until the right one is found. Many people benefit from a combination of two or more types of treatment to control their pain. SYMPTOMS  Chronic pain can occur anywhere in the body and can range from mild to very severe. Some types of chronic pain include:  Headache.  Low back pain.  Cancer pain.  Arthritis pain.  Neurogenic pain. This is pain resulting from damage to nerves. People with chronic pain may also have other symptoms such as:  Depression.  Anger.  Insomnia.  Anxiety. DIAGNOSIS  Your health care provider will help diagnose your condition over time. In many cases, the initial focus will be on excluding possible conditions that could be causing the pain. Depending on your symptoms, your health care provider may order tests to diagnose your condition. Some of these tests may include:   Blood tests.   CT scan.   MRI.   X-rays.   Ultrasounds.   Nerve conduction studies.  You may need to see a specialist.  TREATMENT  Finding treatment that works well may take time. You may be referred to a pain specialist. He or she may prescribe medicine or therapies, such as:   Mindful meditation or yoga.  Shots (injections) of numbing or pain-relieving medicines into the spine or area of pain.  Local electrical stimulation.  Acupuncture.   Massage therapy.   Aroma, color, light, or sound therapy.   Biofeedback.   Working with a physical therapist to keep from getting stiff.   Regular, gentle exercise.   Cognitive or behavioral therapy.   Group support.  Sometimes, surgery may be recommended.  HOME CARE INSTRUCTIONS   Take all medicines as directed by your health care provider.   Lessen stress in your life by relaxing and doing things such as listening to calming music.   Exercise or be active as directed by your health care provider.    Eat a healthy diet and include things such as vegetables, fruits, fish, and lean meats in your diet.   Keep all follow-up appointments with your health care provider.   Attend a support group with others suffering from chronic pain. SEEK MEDICAL CARE IF:   Your pain gets worse.   You develop a new pain that was not there before.   You cannot tolerate medicines given to you by your health care provider.   You have new symptoms since your last visit with your health care provider.  SEEK IMMEDIATE MEDICAL CARE IF:   You feel weak.   You have decreased sensation or numbness.   You lose control of bowel or bladder function.   Your pain suddenly gets much worse.   You develop shaking.  You develop chills.  You develop confusion.  You develop chest pain.  You develop shortness of breath.  MAKE SURE YOU:  Understand these instructions.  Will watch your condition.  Will get help right away if you are not doing well or get worse. Document Released: 01/19/2002 Document Revised: 12/30/2012 Document Reviewed: 10/23/2012 [facility] Patient Information 2015 [place], [place]. This information is not intended to replace advice given to you by your health care provider. Make sure you discuss any questions you have with your health care provider.

## 2015-02-02 NOTE — Telephone Encounter (Signed)
Try to get her worked into someone's schedule this afternoon if leg worsening and BP elevated. Ask if she is still seeing the pain management doctor at [facility] for the leg pain caused by pelvic surgery.

## 2015-02-02 NOTE — Telephone Encounter (Signed)
Patient Emily Stokes is calling that went to the ED in [facility] due to leg pain and swelling. Patient is calling to schedule a follow up appointment and also states that leg is getting worst. Doesn't have blood pressure medicine and feels that her blood pressure is high, has not check. Patient is scheduled to see Simona Huh tomorrow at 8:30. Can patient wait until tomorrow? Informed patient that if needed to be seen today depending on the advise I get we can work her in with another PA.   Please Advise.  Thanks,  -Joseline

## 2015-02-02 NOTE — Telephone Encounter (Signed)
Patient is going to be worked in with [employer] this afternoon. Per patient she is still getting managed by the pain management doctor at [facility] for the leg pain. States the pain medicines are not helping.  Thanks,  -Joseline

## 2015-02-03 ENCOUNTER — Encounter: Payer: Self-pay | Admitting: Physician Assistant

## 2015-02-03 ENCOUNTER — Inpatient Hospital Stay: Payer: Self-pay | Admitting: Family Medicine

## 2015-02-03 ENCOUNTER — Telehealth: Payer: Self-pay | Admitting: Physician Assistant

## 2015-02-03 NOTE — Telephone Encounter (Signed)
Patient advised as directed below.  Thanks,  -Joseline 

## 2015-02-03 NOTE — Telephone Encounter (Signed)
Patient states letter that was written yesterday says for her to return to work immediately. Per patient cannot return to work. If please can the letter state that and with the restrictions. Per patient cannot stand for a long time.  Thanks,  -Emily Stokes

## 2015-02-03 NOTE — Telephone Encounter (Signed)
Pt stated she needs to speak to a nurse about the note that was written yesterday. Thanks TNP

## 2015-02-03 NOTE — Telephone Encounter (Signed)
New letter printed and placed up front for pick up unless patient wants it mailed to her.  Thanks.

## 2015-02-21 ENCOUNTER — Telehealth: Payer: Self-pay | Admitting: Family Medicine

## 2015-02-21 NOTE — Telephone Encounter (Signed)
Please advise 

## 2015-02-21 NOTE — Telephone Encounter (Signed)
Advise patient the application for handicapped placard ready for pick up and she will need to remember to complete the applicant section when taking it to [facility] tag office.

## 2015-02-21 NOTE — Telephone Encounter (Signed)
Pt states she is having problems with her legs.  Pt is requesting a handicap tag form completed.  LT#[phone]/[id number]

## 2015-03-01 NOTE — Telephone Encounter (Signed)
Pt called regarding handicap tag.  I told her she could come by and pick up the application.  Thanks, Con Memos

## 2015-06-23 ENCOUNTER — Ambulatory Visit (INDEPENDENT_AMBULATORY_CARE_PROVIDER_SITE_OTHER): Payer: Medicare Other | Admitting: Family Medicine

## 2015-06-23 ENCOUNTER — Encounter: Payer: Self-pay | Admitting: Family Medicine

## 2015-06-23 VITALS — BP 138/84 | HR 76 | Temp 98.5°F | Resp 16 | Wt 259.8 lb

## 2015-06-23 DIAGNOSIS — M5126 Other intervertebral disc displacement, lumbar region: Secondary | ICD-10-CM | POA: Diagnosis not present

## 2015-06-23 DIAGNOSIS — G894 Chronic pain syndrome: Secondary | ICD-10-CM

## 2015-06-23 DIAGNOSIS — I10 Essential (primary) hypertension: Secondary | ICD-10-CM | POA: Diagnosis not present

## 2015-06-23 DIAGNOSIS — M79606 Pain in leg, unspecified: Secondary | ICD-10-CM | POA: Diagnosis not present

## 2015-06-23 DIAGNOSIS — M5442 Lumbago with sciatica, left side: Secondary | ICD-10-CM | POA: Diagnosis not present

## 2015-06-23 DIAGNOSIS — M5136 Other intervertebral disc degeneration, lumbar region: Secondary | ICD-10-CM

## 2015-06-23 MED ORDER — LIDOCAINE 5 % EX PTCH: 1.0000 | MEDICATED_PATCH | CUTANEOUS | Status: DC

## 2015-06-23 NOTE — Progress Notes (Signed)
Patient ID: Emily Stokes, female   DOB: 1960/09/24, 55 y.o.   MRN: [id number]   Patient: Emily Stokes Female    DOB: 08/15/1960   55 y.o.   MRN: [id number] Visit Date: 06/23/2015  Today's Provider: [name], PA   Chief Complaint  Patient presents with  . Referral   Subjective:    HPI Patient is requesting a referral for pain management due to leg pain, arm pain, and back pain. Patient reports she was referred to pain management previously but due to insurance she could not proceed with appointment. Per patient pain management told patient she would need a new referral to be seen now.   Patient reports she has had thigh/abdomen/ leg pain for a few years and it has gotten worse with time. Patient reports nothing has helped with it thus far. Has a history of right knee cartilage damage requiring arthroscopic surgery in 2013. No association with thigh pains. Has had hernia repair in 2012 and hysterectomy [id number] with complications of right ureter transection and colonperforation found on exploratory laparotomy on 08/21/08 resulting in colostomy for a year (reversed in 2011) . Surgeon at [facility] (Dr. Sharen Counter) felt the chronic pain in her legs/thighs was due to possible nerve damage from the multiple abdominal surgeries she has had. States she had been evaluated at a [facility] and was given Gabapentin. She is due for follow up now that insurance issues have changed and feels she is having pain in her lower back radiating down the left leg to the posterior calf.  Patient Active Problem List   Diagnosis Date Noted  . History of chicken pox 02/02/2015  . Leg pain 02/02/2015  . Hypercholesteremia 02/02/2015  . Blood glucose elevated 02/02/2015  . Pain in soft tissues of limb 02/02/2015  . Nonspecific ST-T changes 02/02/2015  . Awareness of heartbeats 02/02/2015  . Pain in extremity at multiple sites 02/02/2015  . Snores 02/02/2015  . Hernia of anterior abdominal wall 02/24/2012  . Pain in  shoulder 12/05/2011  . Adnexal mass 10/30/2011  . Palpitations 03/14/2011  . HTN (hypertension) 03/14/2011  . Abnormal finding on EKG 03/14/2011  . Obesity 03/14/2011  . Infected surgical wound 09/15/2008  . Urinary system disease 08/10/2008  . Benign essential HTN 07/12/2008  . Arthralgia of lower leg 03/28/2008  . Anemia, iron deficiency 06/17/2007  . Calculus of kidney 05/26/2007    Previous Medications   AMLODIPINE (NORVASC) 10 MG TABLET    Take 1 tablet (10 mg total) by mouth daily.   ASPIRIN 81 MG TABLET    Take 81 mg by mouth daily.   GABAPENTIN (NEURONTIN) 300 MG CAPSULE    Take by mouth.   IBUPROFEN (ADVIL,MOTRIN) 200 MG TABLET    Take by mouth.   LIDOCAINE (LIDODERM) 5 %    Place 1 patch onto the skin daily. Remove & Discard patch within 12 hours or as directed by MD   METOPROLOL TARTRATE (LOPRESSOR) 25 MG TABLET    Take 1 tablet (25 mg total) by mouth 2 (two) times daily.   TAPENTADOL (NUCYNTA) 50 MG TABS TABLET    Take by mouth. Reported on 06/23/2015   No Known Allergies  Review of Systems  HENT: Negative.   Eyes: Negative.   Respiratory: Negative.   Cardiovascular: Negative.   Gastrointestinal: Negative.   Endocrine: Negative.   Genitourinary: Positive for urgency and frequency.  Musculoskeletal: Positive for back pain and arthralgias.  Skin: Negative.   Allergic/Immunologic: Negative.  Neurological: Positive for weakness and numbness.       Left thigh/leg.  Hematological: Negative.   Psychiatric/Behavioral: Negative.     Social History  Substance Use Topics  . Smoking status: Never Smoker   . Smokeless tobacco: Never Used  . Alcohol Use: No   Objective:   BP 138/84 mmHg  Pulse 76  Temp(Src) 98.5 F (36.9 C) (Oral)  Resp 16  Wt 259 lb 12.8 oz (117.845 kg)  Physical Exam  Constitutional: She is oriented to person, place, and time. She appears well-developed and well-nourished. No distress.  HENT:  Head: Normocephalic and atraumatic.  Right Ear:  Hearing normal.  Left Ear: Hearing normal.  Nose: Nose normal.  Eyes: Conjunctivae, EOM and lids are normal. Right eye exhibits no discharge. Left eye exhibits no discharge. No scleral icterus.  Neck: Normal range of motion. Neck supple.  Cardiovascular: Normal rate, regular rhythm and normal heart sounds.   Pulmonary/Chest: Effort normal and breath sounds normal. No respiratory distress.  Abdominal: Bowel sounds are normal. There is tenderness.  Across lower abdomen.  Musculoskeletal: She exhibits tenderness.  Left lower back pain to palpate with radiation to the left thigh. Weakness to test left thigh and foot strength (Poor knee extension and flexion. Poor dorsiflexion and plantar flexion). Good strength in right leg. Pain worse to try to test SLR to 70 degrees on the left.   Neurological: She is alert and oriented to person, place, and time.  DTR's 2+ both biceps, 1+ right knee and 0 left knee.   Skin: Skin is intact. No lesion and no rash noted.  Psychiatric: She has a normal mood and affect. Her speech is normal and behavior is normal. Thought content normal.      Assessment & Plan:     1. Chronic pain syndrome Onset after total hysterectomy [id number] with complications of right ureter transection and colon perforation requiring colostomy for a year (reversed in 2011). Lower abdominal pain with radiation to both anterior thighs has persisted and was being treated at [facility]. Requests referral back to pain clinic now that insurance issues have been corrected.  2. Pain of lower extremity, unspecified laterality Chronic pain in thighs has responded some to use of Lidoderm. Tramadol and Nucynta has cause GI upset. Will refill patches and work on pain management referral. - lidocaine (LIDODERM) 5 %; Place 1 patch onto the skin daily. Remove & Discard patch within 12 hours or as directed by MD  Dispense: 30 patch; Refill: 0  3. Benign essential HTN Well controlled with  use of Metoprolol 25 mg BID and Amlodipine 10 mg qd.   4. Left-sided low back pain with left-sided sciatica Having sharp pain from the left lower back to the left lateral thigh down to the calf with paresthesia of the left anterior thigh. L-S spine films at [facility] ER on 12-13-14 showed severe posterior facet arthropathy at L4-S1 area. Exam today shows weakness in the left leg muscles. Recommend MRI scan L-S spine.

## 2015-07-02 ENCOUNTER — Telehealth: Payer: Self-pay | Admitting: Physician Assistant

## 2015-07-02 DIAGNOSIS — M545 Low back pain, unspecified: Secondary | ICD-10-CM

## 2015-07-03 NOTE — Telephone Encounter (Signed)
Called in Rx into pharmacy 

## 2015-07-03 NOTE — Telephone Encounter (Signed)
Can we please call in her lidoderm 5% patches as refilled above. Thanks.

## 2015-07-13 ENCOUNTER — Ambulatory Visit
Admission: RE | Admit: 2015-07-13 | Discharge: 2015-07-13 | Disposition: A | Discharge status: Home / Self Care | Payer: Medicare Other | Source: Ambulatory Visit | Attending: Family Medicine | Admitting: Family Medicine

## 2015-07-13 DIAGNOSIS — M6281 Muscle weakness (generalized): Secondary | ICD-10-CM | POA: Diagnosis not present

## 2015-07-13 DIAGNOSIS — M5127 Other intervertebral disc displacement, lumbosacral region: Secondary | ICD-10-CM | POA: Diagnosis not present

## 2015-07-13 DIAGNOSIS — M79605 Pain in left leg: Secondary | ICD-10-CM | POA: Insufficient documentation

## 2015-07-13 DIAGNOSIS — M545 Low back pain: Secondary | ICD-10-CM | POA: Diagnosis present

## 2015-07-13 DIAGNOSIS — M4690 Unspecified inflammatory spondylopathy, site unspecified: Secondary | ICD-10-CM | POA: Diagnosis not present

## 2015-07-13 DIAGNOSIS — M5126 Other intervertebral disc displacement, lumbar region: Secondary | ICD-10-CM | POA: Diagnosis not present

## 2015-07-14 NOTE — Addendum Note (Signed)
Addended by: Arnette Norris on: 07/14/2015 09:06 AM   Modules accepted: Orders

## 2015-08-08 DIAGNOSIS — M5416 Radiculopathy, lumbar region: Secondary | ICD-10-CM | POA: Diagnosis not present

## 2015-08-08 DIAGNOSIS — M545 Low back pain: Secondary | ICD-10-CM | POA: Diagnosis not present

## 2015-08-22 ENCOUNTER — Ambulatory Visit: Payer: Medicare Other | Attending: Neurosurgery

## 2015-08-22 DIAGNOSIS — M6281 Muscle weakness (generalized): Secondary | ICD-10-CM

## 2015-08-22 DIAGNOSIS — R262 Difficulty in walking, not elsewhere classified: Secondary | ICD-10-CM | POA: Insufficient documentation

## 2015-08-22 DIAGNOSIS — M545 Low back pain: Secondary | ICD-10-CM | POA: Diagnosis not present

## 2015-08-22 DIAGNOSIS — M79602 Pain in left arm: Secondary | ICD-10-CM | POA: Diagnosis not present

## 2015-08-22 NOTE — Therapy (Signed)
[facility] [address] [place], [address] Phone: [phone]   Fax:  [phone]  Physical Therapy Evaluation  Patient Details  Name: Emily Stokes MRN: [id number] Date of Birth: [birth year] Referring Provider: Newman Pies, MD  Encounter Date: 08/22/2015      PT End of Session - 08/22/15 1023    Visit Number 1   Number of Visits 13   Date for PT Re-Evaluation 10/05/15   Authorization Type 1   Authorization Time Period of 10   PT Start Time 1023  pt filling out paperwork   PT Stop Time 1116   PT Time Calculation (min) 53 min   Activity Tolerance Patient tolerated treatment well   Behavior During Therapy [facility] for tasks assessed/performed      Past Medical History  Diagnosis Date  . Bronchitis   . Hypercholesteremia   . Chicken pox   . Measles   . Excess, menstruation   . Chest pain   . Calculus, kidney   . Anemia   . Iron deficiency   . Abdominal pain, epigastric   . Circulatory disease   . Backache   . Pain in joint, lower leg   . Carpal tunnel syndrome   . Hypertension, essential, benign   . Nausea alone   . Malaise and fatigue   . Disorder of kidney and ureter   . Infected postoperative seroma   . Nonspecific abnormal electrocardiogram (ECG) (EKG)     Past Surgical History  Procedure Laterality Date  . Hernia repair    . Colonoscopy  1997  . Partial hysterectomy  2009     vaginal hysterectomy, has both ovaries  . Cesarean section    . Lithotripsy  0000000    with complications, hospitalized due to these complications for 3 months    There were no vitals filed for this visit.       Subjective Assessment - 08/22/15 1029    Subjective low back pain: 8/10 currently, 6/10 at best (usually sitting), 10/10 at worst (by the end of the day); L hip 8/10 currently (pt sitting), 6/10 at best (usually when pt is sitting), 10/10 at worst. L arm 8/10 currently (pt sitting), 10/10 at  worst (at night when pt is laying down on her R side).   Pertinent History Chronic low back pain. L LE pain started initially, followed by her L hip, then her low back since her hysterectomy in 2010. Her ureter and intestines were cut in half. Had to wear a cholostomy bag for about a year on her L side. Pain has worsened since 2010 and pain is constant. Had and MRI for her low back which revealed arthritis, and a pinched nerve (sciatic nerve ?). MD also told pt that she had degenerative arthritis in her L hip, and arthritis in L leg with damaged nerves. Took gabapentin which did not help. Has an appointment with a pain doctor 08/30/2015.   L arm pain began since her surgery in 2010.  Has not had imaging for L arm  or neck.    Patient Stated Goals "I wish I could get all of it back to normal." No pain.    Currently in Pain? Yes   Pain Score 8    Pain Location Back   Pain Orientation Lower   Pain Descriptors / Indicators Burning;Stabbing   Pain Type Chronic pain   Pain Radiating Towards L hip   Pain Onset More than a  month ago   Pain Frequency Constant   Aggravating Factors  standing and washing dishes, walking, standing for a couple of minutes, beding over. getting ready to get up from sitting; L arm gets worse at night when she lays down on her R side.    Pain Relieving Factors sitting   Multiple Pain Sites Yes  low back, L hip, L thigh, L arm      Objectives:  There-ex  Directed patient with standing R lateral shift correction 5x5 second holds for 3 sets Seated naval ins 10x5 second Reviewed HEP   Improved exercise technique, movement at target joints, use of target muscles after mod verbal, visual, tactile cues.    Decreased L LE symptoms after R lateral shift correction.      [facility] PT Assessment - 08/22/15 1026    Assessment   Medical Diagnosis Chronic low back pain   Referring Provider Newman Pies, MD   Onset Date/Surgical Date 08/08/15  Date referral signed. Pain is  chronic   Prior Therapy No known physical therapy for current condition.    Precautions   Precaution Comments No known precautions   Restrictions   Other Position/Activity Restrictions No known restrictions   Balance Screen   Has the patient fallen in the past 6 months No   Has the patient had a decrease in activity level because of a fear of falling?  No   Is the patient reluctant to leave their home because of a fear of falling?  No   Prior Function   Vocation On disability   Vocation Requirements PLOF: better able to stand, wash dishes, walk, bend over   Observation/Other Assessments   Observations SLUMP test (-) for R LE, increased symptoms with cervical extension for L LE.    Posture/Postural Control   Posture Comments R hip in ER bilateral foot pronation, slight R lateral shift, R iliac crest slightly higher, movement preference thoracolumbar junction,slight R lumbar side bend   AROM   Lumbar Flexion WFL with L lateral hip and thigh pain (R weight shifting)   Lumbar Extension limited with central low back pain, slight L lateral hip and thigh discomfort   Lumbar - Right Side Bend limited with most reproduction of L lateral hip and thigh pain   Lumbar - Left Side Bend limited with central low back pain   Lumbar - Right Rotation WFL with L lateral leg pain   Lumbar - Left Rotation WFL, no pain   Strength   Right Hip Flexion 4-/5   Right Hip Extension 4-/5   Left Hip Flexion 3-/5  with L lateral thigh pain   Left Hip Extension 3-/5   Left Hip ABduction 3-/5  with pain   Right Knee Flexion 4+/5   Right Knee Extension 5/5   Left Knee Flexion 4-/5  with L lateral thigh pain   Left Knee Extension 5/5   Ambulation/Gait   Gait Comments Antalgic, decreased stance L LE                           PT Education - 08/22/15 1947    Education provided Yes   Education Details ther-ex, HEP, plan of care   Person(s) Educated Patient   Methods  Explanation;Demonstration;Tactile cues;Verbal cues;Handout   Comprehension Verbalized understanding;Returned demonstration             PT Long Term Goals - 08/22/15 1956    PT LONG TERM GOAL #1  Title Patient will have a decrease in low back pain to 7/10 or less at worst to promote ability to stand and wash dishes and walk.   Baseline 10/10 low back pain at worst   Time 6   Period Weeks   Status New   PT LONG TERM GOAL #2   Title Patient will have a decrease in L hip pain to 7/10 or less at worst to promote ability to stand and wash dishes and walk.   Baseline 10/10 L hip pain at worst   Time 6   Period Weeks   Status New   PT LONG TERM GOAL #3   Title Patient will have a decrease in L arm pain to 5/10 or less at worst to promote ability to lay on her side at night   Baseline 10/10 L arm pain at worst   Time 6   Period Weeks   Status New   PT LONG TERM GOAL #4   Title Patient will improve bilateral hip strength by at least 1/2 MMT grade to promote ability to perform standing tasks.    Time 6   Period Weeks   Status New               Plan - 08/22/15 1948    Clinical Impression Statement Patient is a 55 year old female who came to physical therapy secondary to low back, L hip, L thigh, and L arm pain. She also presents with reproduction of symptoms with lumbar AROM, bilateral LE weakness, altered gait pattern and posture, and difficulty performing functional tasks such as standing to wash dishes, and walking. Patient will benefit from skilled physical therapy services to address the aforementioned deficits.    Rehab Potential Fair   Clinical Impairments Affecting Rehab Potential Chronicity of condition, pain   PT Frequency 2x / week   PT Duration 6 weeks   PT Treatment/Interventions Therapeutic exercise;Therapeutic activities;Manual techniques;Aquatic Therapy;Electrical Stimulation;Iontophoresis 4mg /ml Dexamethasone;Neuromuscular re-education;Patient/family education    PT Next Visit Plan posture, strengthening, lumbopelvic stability    Consulted and Agree with Plan of Care Patient      Patient will benefit from skilled therapeutic intervention in order to improve the following deficits and impairments:  Pain, Decreased strength, Improper body mechanics, Difficulty walking, Postural dysfunction  Visit Diagnosis: Low back pain, unspecified back pain laterality, with sciatica presence unspecified - Plan: PT plan of care cert/re-cert  Muscle weakness (generalized) - Plan: PT plan of care cert/re-cert  Difficulty in walking, not elsewhere classified - Plan: PT plan of care cert/re-cert  Pain In Left Arm - Plan: PT plan of care cert/re-cert      G-Codes - [id number] 2006    Functional Assessment Tool Used clinical presentation, patient interview   Functional Limitation Mobility: Walking and moving around   Mobility: Walking and Moving Around Current Status [phone]) At least 60 percent but less than 80 percent impaired, limited or restricted   Mobility: Walking and Moving Around Goal Status [phone]) At least 40 percent but less than 60 percent impaired, limited or restricted       Problem List Patient Active Problem List   Diagnosis Date Noted  . History of chicken pox 02/02/2015  . Leg pain 02/02/2015  . Hypercholesteremia 02/02/2015  . Blood glucose elevated 02/02/2015  . Pain in soft tissues of limb 02/02/2015  . Nonspecific ST-T changes 02/02/2015  . Awareness of heartbeats 02/02/2015  . Pain in extremity at multiple sites 02/02/2015  . Snores 02/02/2015  . Hernia  of anterior abdominal wall 02/24/2012  . Pain in shoulder 12/05/2011  . Adnexal mass 10/30/2011  . Palpitations 03/14/2011  . HTN (hypertension) 03/14/2011  . Abnormal finding on EKG 03/14/2011  . Obesity 03/14/2011  . Infected surgical wound 09/15/2008  . Urinary system disease 08/10/2008  . Benign essential HTN 07/12/2008  . Arthralgia of lower leg 03/28/2008  . Anemia,  iron deficiency 06/17/2007  . Calculus of kidney 05/26/2007    Thank you for your referral.  Joneen Boers PT, DPT   08/22/2015, 8:15 PM  [facility] [address] [place], [address] Phone: [phone]   Fax:  [phone]  Name: Emily Stokes MRN: [id number] Date of Birth: 05/23/1960

## 2015-08-22 NOTE — Patient Instructions (Signed)
Standing with your right side against the wall, gently shift your right pelvis towards the wall.  Hold for 5 seconds, repeat 5 times. Perform 3 sets daily.    Gently pull your belly button in.  Hold for 5 seconds Perform throughout the day.

## 2015-08-24 ENCOUNTER — Ambulatory Visit: Payer: Medicare Other

## 2015-08-28 ENCOUNTER — Other Ambulatory Visit: Payer: Self-pay | Admitting: Pain Medicine

## 2015-08-29 ENCOUNTER — Ambulatory Visit: Payer: Medicare Other | Admitting: Pain Medicine

## 2015-08-29 ENCOUNTER — Telehealth: Payer: Self-pay | Admitting: Pain Medicine

## 2015-08-29 ENCOUNTER — Ambulatory Visit: Payer: Medicare Other

## 2015-08-29 NOTE — Telephone Encounter (Signed)
Patient left msg on 08-29-15 stating she had to be at hospital with daughter and granddaughter who is sick/ she will call to resched her appt

## 2015-08-29 NOTE — Telephone Encounter (Signed)
Thank you's

## 2015-09-05 ENCOUNTER — Telehealth: Payer: Self-pay | Admitting: Family Medicine

## 2015-09-05 NOTE — Telephone Encounter (Signed)
Left message to call back  

## 2015-09-05 NOTE — Telephone Encounter (Signed)
Patient advised as directed below. Patient verbalized understanding.  

## 2015-09-05 NOTE — Telephone Encounter (Signed)
If truly constipation, may use Citrate of Magnesia (liquid) and a Glycerine suppository. Should schedule appointment if any bleeding from the bowels or no relief.

## 2015-09-05 NOTE — Telephone Encounter (Signed)
Pt would like to speak with a nurse. Pt stated she has been having stomach pain and lower back pain for 2 days. Pt stated that she feels like she needs to have a BM but when she trys she is unable to go. Pt wanted to see what she should try. Please advise. Thanks TNP

## 2015-09-05 NOTE — Telephone Encounter (Signed)
Emily Stokes, before I call patient, should she come in to be seen? Or is there anything that you can recommend for symptoms? Please advise. Thanks!

## 2015-09-14 ENCOUNTER — Encounter: Payer: Self-pay | Admitting: Pain Medicine

## 2015-09-14 ENCOUNTER — Ambulatory Visit: Payer: Medicare Other | Attending: Pain Medicine | Admitting: Pain Medicine

## 2015-09-14 ENCOUNTER — Encounter (INDEPENDENT_AMBULATORY_CARE_PROVIDER_SITE_OTHER): Payer: Self-pay

## 2015-09-14 VITALS — BP 143/83 | HR 87 | Temp 98.4°F | Resp 18 | Ht 69.0 in | Wt 235.0 lb

## 2015-09-14 DIAGNOSIS — R200 Anesthesia of skin: Secondary | ICD-10-CM | POA: Insufficient documentation

## 2015-09-14 DIAGNOSIS — M5116 Intervertebral disc disorders with radiculopathy, lumbar region: Secondary | ICD-10-CM | POA: Insufficient documentation

## 2015-09-14 DIAGNOSIS — M5126 Other intervertebral disc displacement, lumbar region: Secondary | ICD-10-CM | POA: Insufficient documentation

## 2015-09-14 DIAGNOSIS — M791 Myalgia: Secondary | ICD-10-CM | POA: Diagnosis not present

## 2015-09-14 DIAGNOSIS — M533 Sacrococcygeal disorders, not elsewhere classified: Secondary | ICD-10-CM | POA: Insufficient documentation

## 2015-09-14 DIAGNOSIS — M5416 Radiculopathy, lumbar region: Secondary | ICD-10-CM | POA: Insufficient documentation

## 2015-09-14 DIAGNOSIS — Z87442 Personal history of urinary calculi: Secondary | ICD-10-CM | POA: Diagnosis not present

## 2015-09-14 DIAGNOSIS — M1288 Other specific arthropathies, not elsewhere classified, other specified site: Secondary | ICD-10-CM | POA: Insufficient documentation

## 2015-09-14 DIAGNOSIS — M5136 Other intervertebral disc degeneration, lumbar region: Secondary | ICD-10-CM | POA: Insufficient documentation

## 2015-09-14 DIAGNOSIS — M1990 Unspecified osteoarthritis, unspecified site: Secondary | ICD-10-CM | POA: Insufficient documentation

## 2015-09-14 DIAGNOSIS — K449 Diaphragmatic hernia without obstruction or gangrene: Secondary | ICD-10-CM | POA: Diagnosis not present

## 2015-09-14 DIAGNOSIS — M179 Osteoarthritis of knee, unspecified: Secondary | ICD-10-CM | POA: Diagnosis not present

## 2015-09-14 DIAGNOSIS — I10 Essential (primary) hypertension: Secondary | ICD-10-CM | POA: Diagnosis not present

## 2015-09-14 DIAGNOSIS — M47817 Spondylosis without myelopathy or radiculopathy, lumbosacral region: Secondary | ICD-10-CM | POA: Diagnosis not present

## 2015-09-14 DIAGNOSIS — M4726 Other spondylosis with radiculopathy, lumbar region: Secondary | ICD-10-CM | POA: Insufficient documentation

## 2015-09-14 DIAGNOSIS — R600 Localized edema: Secondary | ICD-10-CM | POA: Insufficient documentation

## 2015-09-14 DIAGNOSIS — M545 Low back pain: Secondary | ICD-10-CM | POA: Diagnosis present

## 2015-09-14 DIAGNOSIS — M47816 Spondylosis without myelopathy or radiculopathy, lumbar region: Secondary | ICD-10-CM | POA: Insufficient documentation

## 2015-09-14 NOTE — Progress Notes (Signed)
Subjective:    Patient ID: Emily Stokes, female    DOB: [birth year], 55 y.o.   MRN: [id number]  HPI  The patient is a 55 year old female who comes to pain management for further evaluation at the request of Simona Huh Chrismon . The patient states that she has had significant pain for significant period of time and states that the pain involves the lower back region and radiates to the lower extremity. The patient also admits to some pain involving the upper extremity to a lesser degree and stated that her predominant pain involves the lower back and lower extremity with pain radiating to the left lower extremity. The patient denied prior interventional treatment or surgery for treatment of her pain of the lower back and lower extremity region. The patient described her pain as aching nagging sharp stabbing pain that awakened patient from sleep and was associated with tingling and numbness as well. The patient stated the pain increased with bending climbing kneeling lifting sitting standing squatting stooping walking. The patient was without identified factors that alleviate the pain. We discussed patient's condition and informed patient that her lumbar MRI was with evidence of significant facet arthritis and there was some concern regarding nerve impingement especially the S1 nerve root. We informed patient that we would schedule patient for interventional treatment at time return appointment since consisting of lumbar facet, medial branch nerve blocks. We also discussed further evaluation including neurosurgical evaluation and PNCV EMG studies. At the present time decision was made to proceed with neurosurgical evaluation and to schedule patient for lumbar facet, medial branch nerve blocks to be performed at time return appointment. All agreed to suggested treatment plan.  Review of Systems    Cardiovascular: High blood  pressure  Pulmonary: Unremarkable  Neurological: Unremarkable  Psychological: Unremarkable  Gastrointestinal: Hiatal hernia  Genitourinary: Kidney stones  Hematologic: Unremarkable  Endocrine: Unremarkable  Rheumatological: Osteoarthritis  Musculoskeletal: Unremarkable  Other significant: Unremarkable      Objective:   Physical Exam  There was tenderness to palpation of the splenius capitis and occipitalis region a mild to moderate degree with tenderness of the cervical facet cervical paraspinal musculature region and thoracic facet thoracic paraspinal musculature regions a moderate degree. Palpation of the acromioclavicular and glenohumeral joint regions reproduced pain of mild to moderate degree and patient was with unremarkable Spurling's maneuver. Patient appeared to be with bilaterally equal grip strength and Tinel and Phalen's maneuver were without increase of pain of significant degree. Palpation of the lumbar paraspinal musculatures and lumbar facet region as well as the lower thoracic paraspinal musculature regions reproduce moderate discomfort to pain. There was moderate muscle spasms noted in the lower thoracic paraspinal musculature region in the lumbar paraspinal musculature region. Extension and palpation of the lumbar facets reproduced rather severe discomfort. Tenderness to palpation was noted in the region of the PSIS and PII S regions a moderate degree. Mild tenderness of the greater trochanteric region iliotibial band region was noted there was tends to palpation of the gluteal and piriformis muscles of mild to moderate degree. The patient was with mild difficulty attending to stand on tiptoes and heels. EHL strength appeared to be decreased. No definite sensory deficit or dermatomal distribution detected. Predominant portion of patient's pain reproduced with lateral bending rotation extension and palpation of the lumbar facets. There was negative clonus  negative Homans. Abdomen nontender with no costovertebral tenderness noted      Assessment & Plan:    Degenerative disc disease lumbar spine  T11-12:  Negative.  T12-L1: Minimal disc bulge. The central canal and foramina are widely patent.  L1-2: Mild facet degenerative disease. Otherwise negative.  L2-3: Mild facet degenerative change. Otherwise negative.  L3-4: Moderate facet arthropathy is present and there is a shallow disc bulge. There is mild marrow edema in the L3 and L4 pedicles consistent with stress change related to facet arthropathy. The central spinal canal and neural foramina remain widely patent.  L4-5: Bilateral facet degenerative change is present. The disc is uncovered without bulging. The central canal and foramina remain widely patent.  L5-S1: The patient has a disc bulge eccentric to the left and there is some facet degenerative disease. There is subarticular zone narrowing on the left which could impact the descending left S1 root. The thecal sac and foramina are widely patent.  IMPRESSION: Disc bulge to the left and facet arthropathy at L5-S1 result in subarticular narrowing which could impact the descending left S1 root. Mild disc bulging at additional levels of the lumbar spine as described above without central canal stenosis.  Facet arthropathy appears worst at L3-4 where there is some marrow edema in the pedicles bilaterally at each level consistent with stress change related to facet degeneration   Lumbar facet syndrome  Lumbar radiculopathy  Sacroiliac joint dysfunction       PLAN  Continue present medication  Lumbar epidural steroid injection to be performed at time return appointment  F/U PCP Simona Huh Chrissmon for evaliation of  BP and general medical  condition  F/U surgical evaluation. Please schedule neurosurgical evaluation of lower back and lower extremity pain and paresthesias Asked the secretary and nurses  the date of your neurosurgical evaluation  F/U neurological evaluation. May consider pending follow-up evaluations. We may consider obtaining PNCV/EMG studies to evaluate pain going down the left lower extremity as discussed. We will proceed with neurosurgical evaluation at this time  May consider radiofrequency rhizolysis or intraspinal procedures pending response to present treatment and F/U evaluation   Patient to call Pain Management Center should patient have concerns prior to scheduled return appointment.

## 2015-09-14 NOTE — Patient Instructions (Addendum)
PLAN  Continue present medication  Lumbar epidural steroid injection to be performed at time return appointment  F/U PCP Gordy Councilman for evaliation of  BP and general medical  condition  F/U surgical evaluation. Please schedule neurosurgical evaluation of lower back and lower extremity pain and paresthesias Asked the secretary and nurses the date of your neurosurgical evaluation  F/U neurological evaluation. May consider pending follow-up evaluations. We may consider obtaining PNCV/EMG studies to evaluate pain going down the left lower extremity as discussed. We will proceed with neurosurgical evaluation at this time  May consider radiofrequency rhizolysis or intraspinal procedures pending response to present treatment and F/U evaluation   Patient to call Pain Management Center should patient have concerns prior to scheduled return appointment. Pain Management Discharge Instructions  General Discharge Instructions :  If you need to reach your doctor call: Monday-Friday 8:00 am - 4:00 pm at [phone] or toll free [phone].  After clinic hours [phone] to have operator reach doctor.  Bring all of your medication bottles to all your appointments in the pain clinic.  To cancel or reschedule your appointment with Pain Management please remember to call 24 hours in advance to avoid a fee.  Refer to the educational materials which you have been given on: General Risks, I had my Procedure. Discharge Instructions, Post Sedation.  Post Procedure Instructions:  The drugs you were given will stay in your system until tomorrow, so for the next 24 hours you should not drive, make any legal decisions or drink any alcoholic beverages.  You may eat anything you prefer, but it is better to start with liquids then soups and crackers, and gradually work up to solid foods.  Please notify your doctor immediately if you have any unusual bleeding, trouble breathing or pain that is not  related to your normal pain.  Depending on the type of procedure that was done, some parts of your body may feel week and/or numb.  This usually clears up by tonight or the next day.  Walk with the use of an assistive device or accompanied by an adult for the 24 hours.  You may use ice on the affected area for the first 24 hours.  Put ice in a Ziploc bag and cover with a towel and place against area 15 minutes on 15 minutes off.  You may switch to heat after 24 hours.GENERAL RISKS AND COMPLICATIONS  What are the risk, side effects and possible complications? Generally speaking, most procedures are safe.  However, with any procedure there are risks, side effects, and the possibility of complications.  The risks and complications are dependent upon the sites that are lesioned, or the type of nerve block to be performed.  The closer the procedure is to the spine, the more serious the risks are.  Great care is taken when placing the radio frequency needles, block needles or lesioning probes, but sometimes complications can occur. 1. Infection: Any time there is an injection through the skin, there is a risk of infection.  This is why sterile conditions are used for these blocks.  There are four possible types of infection. 1. Localized skin infection. 2. Central Nervous System Infection-This can be in the form of Meningitis, which can be deadly. 3. Epidural Infections-This can be in the form of an epidural abscess, which can cause pressure inside of the spine, causing compression of the spinal cord with subsequent paralysis. This would require an emergency surgery to decompress, and there are no guarantees that the  patient would recover from the paralysis. 4. Discitis-This is an infection of the intervertebral discs.  It occurs in about 1% of discography procedures.  It is difficult to treat and it may lead to surgery.        2. Pain: the needles have to go through skin and soft tissues, will cause  soreness.       3. Damage to internal structures:  The nerves to be lesioned may be near blood vessels or    other nerves which can be potentially damaged.       4. Bleeding: Bleeding is more common if the patient is taking blood thinners such as  aspirin, Coumadin, Ticiid, Plavix, etc., or if he/she have some genetic predisposition  such as hemophilia. Bleeding into the spinal canal can cause compression of the spinal  cord with subsequent paralysis.  This would require an emergency surgery to  decompress and there are no guarantees that the patient would recover from the  paralysis.       5. Pneumothorax:  Puncturing of a lung is a possibility, every time a needle is introduced in  the area of the chest or upper back.  Pneumothorax refers to free air around the  collapsed lung(s), inside of the thoracic cavity (chest cavity).  Another two possible  complications related to a similar event would include: Hemothorax and Chylothorax.   These are variations of the Pneumothorax, where instead of air around the collapsed  lung(s), you may have blood or chyle, respectively.       6. Spinal headaches: They may occur with any procedures in the area of the spine.       7. Persistent CSF (Cerebro-Spinal Fluid) leakage: This is a rare problem, but may occur  with prolonged intrathecal or epidural catheters either due to the formation of a fistulous  track or a dural tear.       8. Nerve damage: By working so close to the spinal cord, there is always a possibility of  nerve damage, which could be as serious as a permanent spinal cord injury with  paralysis.       9. Death:  Although rare, severe deadly allergic reactions known as "Anaphylactic  reaction" can occur to any of the medications used.      10. Worsening of the symptoms:  We can always make thing worse.  What are the chances of something like this happening? Chances of any of this occuring are extremely low.  By statistics, you have more of a chance of  getting killed in a motor vehicle accident: while driving to the hospital than any of the above occurring .  Nevertheless, you should be aware that they are possibilities.  In general, it is similar to taking a shower.  Everybody knows that you can slip, hit your head and get killed.  Does that mean that you should not shower again?  Nevertheless always keep in mind that statistics do not mean anything if you happen to be on the wrong side of them.  Even if a procedure has a 1 (one) in a 1,000,000 (million) chance of going wrong, it you happen to be that one..Also, keep in mind that by statistics, you have more of a chance of having something go wrong when taking medications.  Who should not have this procedure? If you are on a blood thinning medication (e.g. Coumadin, Plavix, see list of "Blood Thinners"), or if you have an active infection going on, you should  not have the procedure.  If you are taking any blood thinners, please inform your physician.  How should I prepare for this procedure?  Do not eat or drink anything at least six hours prior to the procedure.  Bring a driver with you .  It cannot be a taxi.  Come accompanied by an adult that can drive you back, and that is strong enough to help you if your legs get weak or numb from the local anesthetic.  Take all of your medicines the morning of the procedure with just enough water to swallow them.  If you have diabetes, make sure that you are scheduled to have your procedure done first thing in the morning, whenever possible.  If you have diabetes, take only half of your insulin dose and notify our nurse that you have done so as soon as you arrive at the clinic.  If you are diabetic, but only take blood sugar pills (oral hypoglycemic), then do not take them on the morning of your procedure.  You may take them after you have had the procedure.  Do not take aspirin or any aspirin-containing medications, at least eleven (11) days prior to  the procedure.  They may prolong bleeding.  Wear loose fitting clothing that may be easy to take off and that you would not mind if it got stained with Betadine or blood.  Do not wear any jewelry or perfume  Remove any nail coloring.  It will interfere with some of our monitoring equipment.  NOTE: Remember that this is not meant to be interpreted as a complete list of all possible complications.  Unforeseen problems may occur.  BLOOD THINNERS The following drugs contain aspirin or other products, which can cause increased bleeding during surgery and should not be taken for 2 weeks prior to and 1 week after surgery.  If you should need take something for relief of minor pain, you may take acetaminophen which is found in Tylenol,m Datril, Anacin-3 and Panadol. It is not blood thinner. The products listed below are.  Do not take any of the products listed below in addition to any listed on your instruction sheet.  A.P.C or A.P.C with Codeine Codeine Phosphate Capsules #3 Ibuprofen Ridaura  ABC compound Congesprin Imuran rimadil  Advil Cope Indocin Robaxisal  Alka-Seltzer Effervescent Pain Reliever and Antacid Coricidin or Coricidin-D  Indomethacin Rufen  Alka-Seltzer plus Cold Medicine Cosprin Ketoprofen S-A-C Tablets  Anacin Analgesic Tablets or Capsules Coumadin Korlgesic Salflex  Anacin Extra Strength Analgesic tablets or capsules CP-2 Tablets Lanoril Salicylate  Anaprox Cuprimine Capsules Levenox Salocol  Anexsia-D Dalteparin Magan Salsalate  Anodynos Darvon compound Magnesium Salicylate Sine-off  Ansaid Dasin Capsules Magsal Sodium Salicylate  Anturane Depen Capsules Marnal Soma  APF Arthritis pain formula Dewitt's Pills Measurin Stanback  Argesic Dia-Gesic Meclofenamic Sulfinpyrazone  Arthritis Bayer Timed Release Aspirin Diclofenac Meclomen Sulindac  Arthritis pain formula Anacin Dicumarol Medipren Supac  Analgesic (Safety coated) Arthralgen Diffunasal Mefanamic Suprofen  Arthritis  Strength Bufferin Dihydrocodeine Mepro Compound Suprol  Arthropan liquid Dopirydamole Methcarbomol with Aspirin Synalgos  ASA tablets/Enseals Disalcid Micrainin Tagament  Ascriptin Doan's Midol Talwin  Ascriptin A/D Dolene Mobidin Tanderil  Ascriptin Extra Strength Dolobid Moblgesic Ticlid  Ascriptin with Codeine Doloprin or Doloprin with Codeine Momentum Tolectin  Asperbuf Duoprin Mono-gesic Trendar  Aspergum Duradyne Motrin or Motrin IB Triminicin  Aspirin plain, buffered or enteric coated Durasal Myochrisine Trigesic  Aspirin Suppositories Easprin Nalfon Trillsate  Aspirin with Codeine Ecotrin Regular or Extra Strength Naprosyn Uracel  Atromid-S Efficin Naproxen Ursinus  Auranofin Capsules Elmiron Neocylate Vanquish  Axotal Emagrin Norgesic Verin  Azathioprine Empirin or Empirin with Codeine Normiflo Vitamin E  Azolid Emprazil Nuprin Voltaren  Bayer Aspirin plain, buffered or children's or timed BC Tablets or powders Encaprin Orgaran Warfarin Sodium  Buff-a-Comp Enoxaparin Orudis Zorpin  Buff-a-Comp with Codeine Equegesic Os-Cal-Gesic   Buffaprin Excedrin plain, buffered or Extra Strength Oxalid   Bufferin Arthritis Strength Feldene Oxphenbutazone   Bufferin plain or Extra Strength Feldene Capsules Oxycodone with Aspirin   Bufferin with Codeine Fenoprofen Fenoprofen Pabalate or Pabalate-SF   Buffets II Flogesic Panagesic   Buffinol plain or Extra Strength Florinal or Florinal with Codeine Panwarfarin   Buf-Tabs Flurbiprofen Penicillamine   Butalbital Compound Four-way cold tablets Penicillin   Butazolidin Fragmin Pepto-Bismol   Carbenicillin Geminisyn Percodan   Carna Arthritis Reliever Geopen Persantine   Carprofen Gold's salt Persistin   Chloramphenicol Goody's Phenylbutazone   Chloromycetin Haltrain Piroxlcam   Clmetidine heparin Plaquenil   Cllnoril Hyco-pap Ponstel   Clofibrate Hydroxy chloroquine Propoxyphen         Before stopping any of these medications, be sure to  consult the physician who ordered them.  Some, such as Coumadin (Warfarin) are ordered to prevent or treat serious conditions such as "deep thrombosis", "pumonary embolisms", and other heart problems.  The amount of time that you may need off of the medication may also vary with the medication and the reason for which you were taking it.  If you are taking any of these medications, please make sure you notify your pain physician before you undergo any procedures.         Epidural Steroid Injection An epidural steroid injection is given to relieve pain in your neck, back, or legs that is caused by the irritation or swelling of a nerve root. This procedure involves injecting a steroid and numbing medicine (anesthetic) into the epidural space. The epidural space is the space between the outer covering of your spinal cord and the bones that form your backbone (vertebra).  LET [facility] CARE PROVIDER KNOW ABOUT:  2. Any allergies you have. 3. All medicines you are taking, including vitamins, herbs, eye drops, creams, and over-the-counter medicines such as aspirin. 4. Previous problems you or members of your family have had with the use of anesthetics. 5. Any blood disorders or blood clotting disorders you have. 6. Previous surgeries you have had. 7. Medical conditions you have. RISKS AND COMPLICATIONS Generally, this is a safe procedure. However, as with any procedure, complications can occur. Possible complications of epidural steroid injection include:  Headache.  Bleeding.  Infection.  Allergic reaction to the medicines.  Damage to your nerves. The response to this procedure depends on the underlying cause of the pain and its duration. People who have long-term (chronic) pain are less likely to benefit from epidural steroids than are those people whose pain comes on strong and suddenly. BEFORE THE PROCEDURE   Ask your health care provider about changing or stopping your regular  medicines. You may be advised to stop taking blood-thinning medicines a few days before the procedure.  You may be given medicines to reduce anxiety.  Arrange for someone to take you home after the procedure. PROCEDURE   You will remain awake during the procedure. You may receive medicine to make you relaxed.  You will be asked to lie on your stomach.  The injection site will be cleaned.  The injection site will be numbed with a medicine (local  anesthetic).  A needle will be injected through your skin into the epidural space.  Your health care provider will use an X-ray machine to ensure that the steroid is delivered closest to the affected nerve. You may have minimal discomfort at this time.  Once the needle is in the right position, the local anesthetic and the steroid will be injected into the epidural space.  The needle will then be removed and a bandage will be applied to the injection site. AFTER THE PROCEDURE  12. You may be monitored for a short time before you go home. 13. You may feel weakness or numbness in your arm or leg, which disappears within hours. 14. You may be allowed to eat, drink, and take your regular medicine. 15. You may have soreness at the site of the injection.   This information is not intended to replace advice given to you by your health care provider. Make sure you discuss any questions you have with your health care provider.   Document Released: 08/06/2007 Document Revised: 12/30/2012 Document Reviewed: 10/16/2012 Elsevier Interactive Patient Education [employer]. . At the present time decision was made to schedule neurosurgical evaluation of patient and to proceed with lumbar facet, medial branch nerve blocks at time return appointment. The patient was with understanding and agreement suggested treatment plan

## 2015-09-19 ENCOUNTER — Ambulatory Visit: Payer: Medicare Other | Attending: Neurosurgery

## 2015-09-19 DIAGNOSIS — M6281 Muscle weakness (generalized): Secondary | ICD-10-CM | POA: Insufficient documentation

## 2015-09-19 DIAGNOSIS — R262 Difficulty in walking, not elsewhere classified: Secondary | ICD-10-CM

## 2015-09-19 DIAGNOSIS — M545 Low back pain: Secondary | ICD-10-CM | POA: Diagnosis not present

## 2015-09-19 NOTE — Patient Instructions (Addendum)
Gave supine transversus abdominis and pelvic floor contraction 10x3 with 5 second holds 3x/day 5 days/week as part of her HEP. Pt demonstrated and verbalized understanding. Handout provided.

## 2015-09-19 NOTE — Therapy (Signed)
[facility] [address] [place], [address] Phone: [phone]   Fax:  [phone]  Physical Therapy Treatment  Patient Details  Name: Emily Stokes MRN: [id number] Date of Birth: 02-16-1961 Referring Provider: Newman Pies, MD  Encounter Date: 09/19/2015      PT End of Session - 09/19/15 1035    Visit Number 2   Number of Visits 13   Date for PT Re-Evaluation 10/05/15   Authorization Type 2   Authorization Time Period of 10   PT Start Time 1035   PT Stop Time 1119   PT Time Calculation (min) 44 min   Activity Tolerance Patient tolerated treatment well   Behavior During Therapy [facility] for tasks assessed/performed      Past Medical History  Diagnosis Date  . Bronchitis   . Hypercholesteremia   . Chicken pox   . Measles   . Excess, menstruation   . Chest pain   . Calculus, kidney   . Anemia   . Iron deficiency   . Abdominal pain, epigastric   . Circulatory disease   . Backache   . Pain in joint, lower leg   . Carpal tunnel syndrome   . Hypertension, essential, benign   . Nausea alone   . Malaise and fatigue   . Disorder of kidney and ureter   . Infected postoperative seroma   . Nonspecific abnormal electrocardiogram (ECG) (EKG)     Past Surgical History  Procedure Laterality Date  . Hernia repair    . Colonoscopy  1997  . Partial hysterectomy  2009     vaginal hysterectomy, has both ovaries  . Cesarean section    . Lithotripsy  0000000    with complications, hospitalized due to these complications for 3 months    There were no vitals filed for this visit.      Subjective Assessment - 09/19/15 1037    Subjective Still having problems with her low back and L LE. 7/10 low back pain currently, 8/10 L LE currently (lateral thigh). Pt adds that she has a procedure to be done on her back 09/25/15 from her MD which is right before her next PT session.    Pertinent History Chronic low back  pain. L LE pain started initially, followed by her L hip, then her low back since her hysterectomy in 2010. Her ureter and intestines were cut in half. Had to wear a cholostomy bag for about a year on her L side. Pain has worsened since 2010 and pain is constant. Had and MRI for her low back which revealed arthritis, and a pinched nerve (sciatic nerve ?). MD also told pt that she had degenerative arthritis in her L hip, and arthritis in L leg with damaged nerves. Took gabapentin which did not help. Has an appointment with a pain doctor 08/30/2015.   L arm pain began since her surgery in 2010.  Has not had imaging for L arm  or neck.    Patient Stated Goals "I wish I could get all of it back to normal." No pain.    Currently in Pain? Yes   Pain Score 8    Pain Onset More than a month ago    Pt also adds having an MRI for her low back last week which revealed arthritis and that the sciatic nerve on L LE is affected. Being sent to a neurologist.      Objectives:  There-ex  Directed  patient with standing L shoulder adduction resisting red band 10x3 with 5 second holds  Standing bilateral shoulder extension resisting red band 10x2 with 5 second holds Seated L hip extension isometrics (secondary to L anterior pelvic tilt in standing palpated) 10x3 with 5 second holds  Supine transversus abdominis contraction 10x5 seconds  Then with pelvic floor contractions 10x5 seconds for 2 sets  Then with hip fall outs 5x3 each LE Supine bilateral shoulder extension isometrics in hook lying position with glute max squeeze 10x5 seconds for 3 sets Log rolling to go from supine to sit 1x   Improved exercise technique, movement at target joints, use of target muscles after mod verbal, visual, tactile cues.     Some difficulty and discomfort with hip fall outs L LE which improved after increased repetition. Difficulty with lumbopelvic control and use of transversus abdominis muscle. Pt states back and L LE feels  better after session. Pt will benefit from continued skilled physical therapy services to continue promoting lumbopelvic stability, control, strength, bilateral LE strength, decrease back and LE pain and improve function.                              PT Education - 09/19/15 1041    Education provided Yes   Education Details ther-ex   [employer]) Educated Patient   Methods Explanation;Demonstration;Tactile cues;Verbal cues;Handout   Comprehension Verbalized understanding;Returned demonstration             PT Long Term Goals - 08/22/15 1956    PT LONG TERM GOAL #1   Title Patient will have a decrease in low back pain to 7/10 or less at worst to promote ability to stand and wash dishes and walk.   Baseline 10/10 low back pain at worst   Time 6   Period Weeks   Status New   PT LONG TERM GOAL #2   Title Patient will have a decrease in L hip pain to 7/10 or less at worst to promote ability to stand and wash dishes and walk.   Baseline 10/10 L hip pain at worst   Time 6   Period Weeks   Status New   PT LONG TERM GOAL #3   Title Patient will have a decrease in L arm pain to 5/10 or less at worst to promote ability to lay on her side at night   Baseline 10/10 L arm pain at worst   Time 6   Period Weeks   Status New   PT LONG TERM GOAL #4   Title Patient will improve bilateral hip strength by at least 1/2 MMT grade to promote ability to perform standing tasks.    Time 6   Period Weeks   Status New               Plan - 09/19/15 1042    Clinical Impression Statement Some difficulty and discomfort with hip fall outs L LE which improved after increased repetition. Difficulty with lumbopelvic control and use of transversus abdominis muscle. Pt states back and L LE feels better after session. Pt will benefit from continued skilled physical therapy services to continue promoting lumbopelvic stability, control, strength, bilateral LE strength, decrease back and  LE pain and improve function.     Rehab Potential Fair   Clinical Impairments Affecting Rehab Potential Chronicity of condition, pain   PT Frequency 2x / week   PT Duration 6 weeks   PT Treatment/Interventions  Therapeutic exercise;Therapeutic activities;Manual techniques;Aquatic Therapy;Electrical Stimulation;Iontophoresis 4mg /ml Dexamethasone;Neuromuscular re-education;Patient/family education   PT Next Visit Plan posture, strengthening, lumbopelvic stability    Consulted and Agree with Plan of Care Patient      Patient will benefit from skilled therapeutic intervention in order to improve the following deficits and impairments:  Pain, Decreased strength, Improper body mechanics, Difficulty walking, Postural dysfunction  Visit Diagnosis: Low back pain, unspecified back pain laterality, with sciatica presence unspecified  Muscle weakness (generalized)  Difficulty in walking, not elsewhere classified     Problem List Patient Active Problem List   Diagnosis Date Noted  . DDD (degenerative disc disease), lumbar 09/14/2015  . Facet syndrome, lumbar 09/14/2015  . Lumbar radiculopathy 09/14/2015  . Sacroiliac joint dysfunction 09/14/2015  . History of chicken pox 02/02/2015  . Leg pain 02/02/2015  . Hypercholesteremia 02/02/2015  . Blood glucose elevated 02/02/2015  . Pain in soft tissues of limb 02/02/2015  . Nonspecific ST-T changes 02/02/2015  . Awareness of heartbeats 02/02/2015  . Pain in extremity at multiple sites 02/02/2015  . Snores 02/02/2015  . Hernia of anterior abdominal wall 02/24/2012  . Pain in shoulder 12/05/2011  . Adnexal mass 10/30/2011  . Palpitations 03/14/2011  . HTN (hypertension) 03/14/2011  . Abnormal finding on EKG 03/14/2011  . Obesity 03/14/2011  . Infected surgical wound 09/15/2008  . Urinary system disease 08/10/2008  . Benign essential HTN 07/12/2008  . Arthralgia of lower leg 03/28/2008  . Anemia, iron deficiency 06/17/2007  . Calculus of  kidney 05/26/2007    Joneen Boers PT, DPT   09/19/2015, 12:43 PM  [facility] [address] [place], [address] Phone: [phone]   Fax:  [phone]  Name: Emily Stokes MRN: [id number] Date of Birth: 02/22/1961

## 2015-09-21 LAB — TOXASSURE SELECT 13 (MW), URINE

## 2015-09-21 NOTE — Progress Notes (Signed)
Quick Note:  Reviewed. ______ 

## 2015-09-25 ENCOUNTER — Ambulatory Visit: Payer: Medicare Other

## 2015-09-25 ENCOUNTER — Encounter: Payer: Self-pay | Admitting: Pain Medicine

## 2015-09-25 ENCOUNTER — Ambulatory Visit: Payer: Medicare Other | Attending: Pain Medicine | Admitting: Pain Medicine

## 2015-09-25 VITALS — BP 137/91 | HR 76 | Temp 96.7°F | Resp 14 | Ht 69.0 in | Wt 238.0 lb

## 2015-09-25 DIAGNOSIS — M5127 Other intervertebral disc displacement, lumbosacral region: Secondary | ICD-10-CM | POA: Insufficient documentation

## 2015-09-25 DIAGNOSIS — M5124 Other intervertebral disc displacement, thoracic region: Secondary | ICD-10-CM | POA: Diagnosis not present

## 2015-09-25 DIAGNOSIS — M5126 Other intervertebral disc displacement, lumbar region: Secondary | ICD-10-CM | POA: Insufficient documentation

## 2015-09-25 DIAGNOSIS — M1288 Other specific arthropathies, not elsewhere classified, other specified site: Secondary | ICD-10-CM | POA: Diagnosis not present

## 2015-09-25 DIAGNOSIS — M5136 Other intervertebral disc degeneration, lumbar region: Secondary | ICD-10-CM

## 2015-09-25 DIAGNOSIS — M545 Low back pain: Secondary | ICD-10-CM | POA: Diagnosis not present

## 2015-09-25 DIAGNOSIS — M5416 Radiculopathy, lumbar region: Secondary | ICD-10-CM

## 2015-09-25 DIAGNOSIS — M533 Sacrococcygeal disorders, not elsewhere classified: Secondary | ICD-10-CM

## 2015-09-25 DIAGNOSIS — M47816 Spondylosis without myelopathy or radiculopathy, lumbar region: Secondary | ICD-10-CM

## 2015-09-25 DIAGNOSIS — M79606 Pain in leg, unspecified: Secondary | ICD-10-CM | POA: Diagnosis present

## 2015-09-25 MED ORDER — SODIUM CHLORIDE 0.9% FLUSH: 20.0000 mL | Freq: Once | INTRAVENOUS | Status: DC

## 2015-09-25 MED ORDER — LACTATED RINGERS IV SOLN: 1000.0000 mL | INTRAVENOUS | Status: DC

## 2015-09-25 MED ORDER — MIDAZOLAM HCL 5 MG/5ML IJ SOLN
5.0000 mg | Freq: Once | INTRAMUSCULAR | Status: AC
  Administered 2015-09-25: 4 mg via INTRAVENOUS
  Filled 2015-09-25: qty 5

## 2015-09-25 MED ORDER — LIDOCAINE HCL (PF) 1 % IJ SOLN
10.0000 mL | Freq: Once | INTRAMUSCULAR | Status: DC
Start: 2015-09-25 — End: 2017-12-23
  Filled 2015-09-25: qty 10

## 2015-09-25 MED ORDER — TRIAMCINOLONE ACETONIDE 40 MG/ML IJ SUSP
40.0000 mg | Freq: Once | INTRAMUSCULAR | Status: DC
  Filled 2015-09-25: qty 1

## 2015-09-25 MED ORDER — ORPHENADRINE CITRATE 30 MG/ML IJ SOLN
60.0000 mg | Freq: Once | INTRAMUSCULAR | Status: AC
  Administered 2015-09-25: 60 mg via INTRAMUSCULAR
  Filled 2015-09-25: qty 2

## 2015-09-25 MED ORDER — BUPIVACAINE HCL (PF) 0.25 % IJ SOLN
30.0000 mL | Freq: Once | INTRAMUSCULAR | Status: DC
  Filled 2015-09-25: qty 30

## 2015-09-25 MED ORDER — CEFUROXIME AXETIL 250 MG PO TABS: 250.0000 mg | ORAL_TABLET | Freq: Two times a day (BID) | ORAL | Status: DC

## 2015-09-25 MED ORDER — SODIUM CHLORIDE 0.9 % IJ SOLN
INTRAMUSCULAR | Status: AC
  Administered 2015-09-25: 12:00:00
  Filled 2015-09-25: qty 20

## 2015-09-25 MED ORDER — FENTANYL CITRATE (PF) 100 MCG/2ML IJ SOLN
100.0000 ug | Freq: Once | INTRAMUSCULAR | Status: AC
  Administered 2015-09-25: 50 ug via INTRAVENOUS
  Filled 2015-09-25: qty 2

## 2015-09-25 MED ORDER — CEFAZOLIN SODIUM 1-5 GM-% IV SOLN
1.0000 g | Freq: Once | INTRAVENOUS | Status: AC
  Administered 2015-09-25: 1 g via INTRAVENOUS

## 2015-09-25 NOTE — Patient Instructions (Addendum)
PLAN  Continue present medication area please obtain Ceftin antibiotic and begin taking Ceftin antibiotic today as prescribed  F/U PCP Emily Stokes for evaliation of  BP and general medical  condition  F/U surgical evaluation. Please schedule neurosurgical evaluation of lower back and lower extremity pain and paresthesias Asked the secretary and nurses the date of your neurosurgical evaluation  F/U neurological evaluation. May consider pending follow-up evaluations. We may consider obtaining PNCV/EMG studies to evaluate pain going down the left lower extremity as discussed. We will proceed with neurosurgical evaluation at this time  May consider radiofrequency rhizolysis or intraspinal procedures pending response to present treatment and F/U evaluation   Patient to call [facility] Management Center should patient have concerns prior to scheduled return appointment.GENERAL RISKS AND COMPLICATIONS  What are the risk, side effects and possible complications? Generally speaking, most procedures are safe.  However, with any procedure there are risks, side effects, and the possibility of complications.  The risks and complications are dependent upon the sites that are lesioned, or the type of nerve block to be performed.  The closer the procedure is to the spine, the more serious the risks are.  Great care is taken when placing the radio frequency needles, block needles or lesioning probes, but sometimes complications can occur. 1. Infection: Any time there is an injection through the skin, there is a risk of infection.  This is why sterile conditions are used for these blocks.  There are four possible types of infection. 1. Localized skin infection. 2. Central Nervous System Infection-This can be in the form of Meningitis, which can be deadly. 3. Epidural Infections-This can be in the form of an epidural abscess, which can cause pressure inside of the spine, causing compression of the spinal cord with  subsequent paralysis. This would require an emergency surgery to decompress, and there are no guarantees that the patient would recover from the paralysis. 4. Discitis-This is an infection of the intervertebral discs.  It occurs in about 1% of discography procedures.  It is difficult to treat and it may lead to surgery.        2. Pain: the needles have to go through skin and soft tissues, will cause soreness.       3. Damage to internal structures:  The nerves to be lesioned may be near blood vessels or    other nerves which can be potentially damaged.       4. Bleeding: Bleeding is more common if the patient is taking blood thinners such as  aspirin, Coumadin, Ticiid, Plavix, etc., or if he/she have some genetic predisposition  such as hemophilia. Bleeding into the spinal canal can cause compression of the spinal  cord with subsequent paralysis.  This would require an emergency surgery to  decompress and there are no guarantees that the patient would recover from the  paralysis.       5. Pneumothorax:  Puncturing of a lung is a possibility, every time a needle is introduced in  the area of the chest or upper back.  Pneumothorax refers to free air around the  collapsed lung(s), inside of the thoracic cavity (chest cavity).  Another two possible  complications related to a similar event would include: Hemothorax and Chylothorax.   These are variations of the Pneumothorax, where instead of air around the collapsed  lung(s), you may have blood or chyle, respectively.       6. Spinal headaches: They may occur with any procedures in the area of  the spine.       7. Persistent CSF (Cerebro-Spinal Fluid) leakage: This is a rare problem, but may occur  with prolonged intrathecal or epidural catheters either due to the formation of a fistulous  track or a dural tear.       8. Nerve damage: By working so close to the spinal cord, there is always a possibility of  nerve damage, which could be as serious as a permanent  spinal cord injury with  paralysis.       9. Death:  Although rare, severe deadly allergic reactions known as "Anaphylactic  reaction" can occur to any of the medications used.      10. Worsening of the symptoms:  We can always make thing worse.  What are the chances of something like this happening? Chances of any of this occuring are extremely low.  By statistics, you have more of a chance of getting killed in a motor vehicle accident: while driving to the hospital than any of the above occurring .  Nevertheless, you should be aware that they are possibilities.  In general, it is similar to taking a shower.  Everybody knows that you can slip, hit your head and get killed.  Does that mean that you should not shower again?  Nevertheless always keep in mind that statistics do not mean anything if you happen to be on the wrong side of them.  Even if a procedure has a 1 (one) in a 1,000,000 (million) chance of going wrong, it you happen to be that one..Also, keep in mind that by statistics, you have more of a chance of having something go wrong when taking medications.  Who should not have this procedure? If you are on a blood thinning medication (e.g. Coumadin, Plavix, see list of "Blood Thinners"), or if you have an active infection going on, you should not have the procedure.  If you are taking any blood thinners, please inform your physician.  How should I prepare for this procedure?  Do not eat or drink anything at least six hours prior to the procedure.  Bring a driver with you .  It cannot be a taxi.  Come accompanied by an adult that can drive you back, and that is strong enough to help you if your legs get weak or numb from the local anesthetic.  Take all of your medicines the morning of the procedure with just enough water to swallow them.  If you have diabetes, make sure that you are scheduled to have your procedure done first thing in the morning, whenever possible.  If you have  diabetes, take only half of your insulin dose and notify our nurse that you have done so as soon as you arrive at the clinic.  If you are diabetic, but only take blood sugar pills (oral hypoglycemic), then do not take them on the morning of your procedure.  You may take them after you have had the procedure.  Do not take aspirin or any aspirin-containing medications, at least eleven (11) days prior to the procedure.  They may prolong bleeding.  Wear loose fitting clothing that may be easy to take off and that you would not mind if it got stained with Betadine or blood.  Do not wear any jewelry or perfume  Remove any nail coloring.  It will interfere with some of our monitoring equipment.  NOTE: Remember that this is not meant to be interpreted as a complete list of all possible complications.  Unforeseen problems may occur.  BLOOD THINNERS The following drugs contain aspirin or other products, which can cause increased bleeding during surgery and should not be taken for 2 weeks prior to and 1 week after surgery.  If you should need take something for relief of minor pain, you may take acetaminophen which is found in Tylenol,m Datril, Anacin-3 and Panadol. It is not blood thinner. The products listed below are.  Do not take any of the products listed below in addition to any listed on your instruction sheet.  A.P.C or A.P.C with Codeine Codeine Phosphate Capsules #3 Ibuprofen Ridaura  ABC compound Congesprin Imuran rimadil  Advil Cope Indocin Robaxisal  Alka-Seltzer Effervescent Pain Reliever and Antacid Coricidin or Coricidin-D  Indomethacin Rufen  Alka-Seltzer plus Cold Medicine Cosprin Ketoprofen S-A-C Tablets  Anacin Analgesic Tablets or Capsules Coumadin Korlgesic Salflex  Anacin Extra Strength Analgesic tablets or capsules CP-2 Tablets Lanoril Salicylate  Anaprox Cuprimine Capsules Levenox Salocol  Anexsia-D Dalteparin Magan Salsalate  Anodynos Darvon compound Magnesium Salicylate  Sine-off  Ansaid Dasin Capsules Magsal Sodium Salicylate  Anturane Depen Capsules Marnal Soma  APF Arthritis pain formula Dewitt's Pills Measurin Stanback  Argesic Dia-Gesic Meclofenamic Sulfinpyrazone  Arthritis Bayer Timed Release Aspirin Diclofenac Meclomen Sulindac  Arthritis pain formula Anacin Dicumarol Medipren Supac  Analgesic (Safety coated) Arthralgen Diffunasal Mefanamic Suprofen  Arthritis Strength Bufferin Dihydrocodeine Mepro Compound Suprol  Arthropan liquid Dopirydamole Methcarbomol with Aspirin Synalgos  ASA tablets/Enseals Disalcid Micrainin Tagament  Ascriptin Doan's Midol Talwin  Ascriptin A/D Dolene Mobidin Tanderil  Ascriptin Extra Strength Dolobid Moblgesic Ticlid  Ascriptin with Codeine Doloprin or Doloprin with Codeine Momentum Tolectin  Asperbuf Duoprin Mono-gesic Trendar  Aspergum Duradyne Motrin or Motrin IB Triminicin  Aspirin plain, buffered or enteric coated Durasal Myochrisine Trigesic  Aspirin Suppositories Easprin Nalfon Trillsate  Aspirin with Codeine Ecotrin Regular or Extra Strength Naprosyn Uracel  Atromid-S Efficin Naproxen Ursinus  Auranofin Capsules Elmiron Neocylate Vanquish  Axotal Emagrin Norgesic Verin  Azathioprine Empirin or Empirin with Codeine Normiflo Vitamin E  Azolid Emprazil Nuprin Voltaren  Bayer Aspirin plain, buffered or children's or timed BC Tablets or powders Encaprin Orgaran Warfarin Sodium  Buff-a-Comp Enoxaparin Orudis Zorpin  Buff-a-Comp with Codeine Equegesic Os-Cal-Gesic   Buffaprin Excedrin plain, buffered or Extra Strength Oxalid   Bufferin Arthritis Strength Feldene Oxphenbutazone   Bufferin plain or Extra Strength Feldene Capsules Oxycodone with Aspirin   Bufferin with Codeine Fenoprofen Fenoprofen Pabalate or Pabalate-SF   Buffets II Flogesic Panagesic   Buffinol plain or Extra Strength Florinal or Florinal with Codeine Panwarfarin   Buf-Tabs Flurbiprofen Penicillamine   Butalbital Compound Four-way cold tablets  Penicillin   Butazolidin Fragmin Pepto-Bismol   Carbenicillin Geminisyn Percodan   Carna Arthritis Reliever Geopen Persantine   Carprofen Gold's salt Persistin   Chloramphenicol Goody's Phenylbutazone   Chloromycetin Haltrain Piroxlcam   Clmetidine heparin Plaquenil   Cllnoril Hyco-pap Ponstel   Clofibrate Hydroxy chloroquine Propoxyphen         Before stopping any of these medications, be sure to consult the physician who ordered them.  Some, such as Coumadin (Warfarin) are ordered to prevent or treat serious conditions such as "deep thrombosis", "pumonary embolisms", and other heart problems.  The amount of time that you may need off of the medication may also vary with the medication and the reason for which you were taking it.  If you are taking any of these medications, please make sure you notify your pain physician before you undergo any procedures.

## 2015-09-25 NOTE — Progress Notes (Signed)
Subjective:    Patient ID: Emily Stokes, female    DOB: 1960/11/07, 55 y.o.   MRN: [id number]  HPI  PROCEDURE PERFORMED: Lumbar epidural steroid injection   NOTE: The patient is a 55 y.o. female who returns to [facility] for further evaluation and treatment of pain involving the lumbar and lower extremity region. MRI revealed the patient to be with Degenerative disc disease lumbar spine  T11-12: Negative.  T12-L1: Minimal disc bulge. The central canal and foramina are widely patent.  L1-2: Mild facet degenerative disease. Otherwise negative.  L2-3: Mild facet degenerative change. Otherwise negative.  L3-4: Moderate facet arthropathy is present and there is a shallow disc bulge. There is mild marrow edema in the L3 and L4 pedicles consistent with stress change related to facet arthropathy. The central spinal canal and neural foramina remain widely patent.  L4-5: Bilateral facet degenerative change is present. The disc is uncovered without bulging. The central canal and foramina remain widely patent.  L5-S1: The patient has a disc bulge eccentric to the left and there is some facet degenerative disease. There is subarticular zone narrowing on the left which could impact the descending left S1 root. The thecal sac and foramina are widely patent.  IMPRESSION: Disc bulge to the left and facet arthropathy at L5-S1 result in subarticular narrowing which could impact the descending left S1 root. Mild disc bulging at additional levels of the lumbar spine as described above without central canal stenosis.  Facet arthropathy appears worst at L3-4 where there is some marrow edema in the pedicles bilaterally at each level consistent with stress change related to facet degeneration. There is concern regarding intraspinal abnormality continue to patient's symptomatology with concern regarding component of pain due to lumbar stenosis with neurogenic claudication  in addition to lumbar facet syndrome as well as concern regarding component of lumbar radiculopathy The risks, benefits, and expectations of the procedure have been discussed and explained to the patient who was understanding and in agreement with suggested treatment plan. We will proceed with lumbar epidural steroid injection as discussed and as explained to the patient who is willing to proceed with procedure as planned.   DESCRIPTION OF PROCEDURE: Lumbar epidural steroid injection with IV Versed, IV fentanyl conscious sedation, EKG, blood pressure, pulse, capnography, and pulse oximetry monitoring. The procedure was performed with the patient in the prone position under fluoroscopic guidance. A local anesthetic skin wheal of 1.5% plain lidocaine was accomplished at proposed entry site. An 18-gauge Tuohy epidural needle was inserted at the L 4 vertebral body level right of the midline via loss-of-resistance technique with negative heme and negative CSF return. A total of 4 mL of Preservative-Free normal saline with 40 mg of Kenalog injected incrementally via epidurally placed needle. Needle was removed.  Myoneural block injections of the gluteal musculature region Following Betadine prep of proposed entry site a 22-gauge needle was inserted into the gluteal musculature region and following negative aspiration 2 cc of 0.25% bupivacaine with Norflex was injected for myoneural block injection of the gluteal musculature region times four.    A total of 40 mg of Kenalog was utilized for the procedure.   The patient tolerated the injection well.    PLAN:   1. Medications: We will continue presently prescribed medications. 2. Will consider modification of treatment regimen pending response to treatment rendered on today's visit and follow-up evaluation. 3. The patient is to follow-up with primary care physician  this  [place] regarding blood pressure and  general medical condition status post lumbar  epidural steroid injection performed on today's visit. 4. Surgical evaluation.Marland Kitchen Has been addressed 5. Neurological evaluation.Marland Kitchen Has been addressed 6. The patient may be a candidate for radiofrequency procedures, implantation device, and other treatment pending response to treatment and follow-up evaluation. 7. The patient has been advised to adhere to proper body mechanics and avoid activities which appear to aggravate condition. 8. The patient has been advised to call the Pain Management Center prior to scheduled return appointment should there be significant change in condition or should there be sign  The patient is understanding and agrees with the suggested  treatment plan   Review of Systems     Objective:   Physical Exam        Assessment & Plan:

## 2015-09-25 NOTE — Progress Notes (Signed)
Safety precautions to be maintained throughout the outpatient stay will include: orient to surroundings, keep bed in low position, maintain call bell within reach at all times, provide assistance with transfer out of bed and ambulation.  

## 2015-09-26 ENCOUNTER — Telehealth: Payer: Self-pay

## 2015-09-26 NOTE — Telephone Encounter (Signed)
Pt not home- I didn't leave my name or what  I was calling about, but told the gentleman that i was calling to see how she was doing today. She seems to be doing OK.

## 2015-09-27 ENCOUNTER — Ambulatory Visit: Payer: Medicare Other

## 2015-10-06 ENCOUNTER — Telehealth: Payer: Self-pay

## 2015-10-06 NOTE — Telephone Encounter (Signed)
Patient states her handicap sticker runs out this month and wanted to see if she can get a new one-aa

## 2015-10-06 NOTE — Telephone Encounter (Signed)
Advise patient form left at the front desk for pick up.

## 2015-10-10 NOTE — Telephone Encounter (Signed)
Advised patient as below.  

## 2015-10-19 ENCOUNTER — Ambulatory Visit: Payer: Medicare Other | Attending: Pain Medicine | Admitting: Pain Medicine

## 2015-10-19 ENCOUNTER — Ambulatory Visit: Payer: Medicare Other | Attending: Neurosurgery

## 2015-10-19 ENCOUNTER — Encounter: Payer: Self-pay | Admitting: Pain Medicine

## 2015-10-19 VITALS — BP 162/101 | HR 84 | Temp 98.2°F | Resp 16 | Ht 69.0 in | Wt 238.0 lb

## 2015-10-19 VITALS — BP 131/92 | HR 90

## 2015-10-19 DIAGNOSIS — M47816 Spondylosis without myelopathy or radiculopathy, lumbar region: Secondary | ICD-10-CM

## 2015-10-19 DIAGNOSIS — M6281 Muscle weakness (generalized): Secondary | ICD-10-CM | POA: Diagnosis not present

## 2015-10-19 DIAGNOSIS — M5136 Other intervertebral disc degeneration, lumbar region: Secondary | ICD-10-CM

## 2015-10-19 DIAGNOSIS — M5416 Radiculopathy, lumbar region: Secondary | ICD-10-CM

## 2015-10-19 DIAGNOSIS — M791 Myalgia: Secondary | ICD-10-CM | POA: Diagnosis not present

## 2015-10-19 DIAGNOSIS — M1288 Other specific arthropathies, not elsewhere classified, other specified site: Secondary | ICD-10-CM | POA: Insufficient documentation

## 2015-10-19 DIAGNOSIS — M5116 Intervertebral disc disorders with radiculopathy, lumbar region: Secondary | ICD-10-CM | POA: Insufficient documentation

## 2015-10-19 DIAGNOSIS — R262 Difficulty in walking, not elsewhere classified: Secondary | ICD-10-CM

## 2015-10-19 DIAGNOSIS — M47817 Spondylosis without myelopathy or radiculopathy, lumbosacral region: Secondary | ICD-10-CM | POA: Diagnosis not present

## 2015-10-19 DIAGNOSIS — M79602 Pain in left arm: Secondary | ICD-10-CM | POA: Diagnosis not present

## 2015-10-19 DIAGNOSIS — M5124 Other intervertebral disc displacement, thoracic region: Secondary | ICD-10-CM | POA: Insufficient documentation

## 2015-10-19 DIAGNOSIS — M5127 Other intervertebral disc displacement, lumbosacral region: Secondary | ICD-10-CM | POA: Insufficient documentation

## 2015-10-19 DIAGNOSIS — M545 Low back pain: Secondary | ICD-10-CM | POA: Diagnosis not present

## 2015-10-19 DIAGNOSIS — M5126 Other intervertebral disc displacement, lumbar region: Secondary | ICD-10-CM | POA: Insufficient documentation

## 2015-10-19 DIAGNOSIS — M533 Sacrococcygeal disorders, not elsewhere classified: Secondary | ICD-10-CM | POA: Diagnosis not present

## 2015-10-19 DIAGNOSIS — M47896 Other spondylosis, lumbar region: Secondary | ICD-10-CM | POA: Insufficient documentation

## 2015-10-19 NOTE — Progress Notes (Signed)
Patient here today for f/up. States pain is better in her back but Left leg continues to have pain that begins in hip and radiates around to groin area and down the leg.  Safety precautions to be maintained throughout the outpatient stay will include: orient to surroundings, keep bed in low position, maintain call bell within reach at all times, provide assistance with transfer out of bed and ambulation.

## 2015-10-19 NOTE — Progress Notes (Signed)
Subjective:    Patient ID: Emily Stokes, female    DOB: January 13, 1961, 55 y.o.   MRN: [id number]  HPI  The patient is a 55 year old female who returns to pain management for further evaluation and treatment of pain involving the lower back and lower extremity regions. The patient is status post lumbar epidural steroid injection with some improvement of her pain. The patient states that the pain of the lower back region improved significantly since that she has some persistent pain radiating from the lower back to the lower extremity especially on the left. The patient states that the pain radiates to the foot. We reviewed MRI findings and discussed patient's symptoms in relation to the MRI findings. Decision was made to schedule patient for lumbosacral selective nerve root block to be performed at time of return appointment. We also advised patient to follow-up with primary care physician Emily Stokes for evaluation and treatment of blood pressure. We will proceed with procedure pending medical clearance. All were understanding and agreement suggested treatment plan  Review of Systems     Objective:   Physical Exam  There was tenderness over the splenius capitis and occipitalis region palpation which reproduces mild discomfort with mild tenderness over the cervical facet cervical paraspinal musculature region. Palpation over the region of the thoracic facet thoracic paraspinal musculature region was attends to palpation of mild to moderate degree without crepitus of the thoracic region noted. Palpation of the acromioclavicular and glenohumeral joint regions reproduced pain of mild degree and patient was with unremarkable Spurling's maneuver. The patient appeared to be with bilaterally equal grip strength and Tinel and Phalen's maneuver reproduces minimal discomfort. Palpation over the thoracic region was attends to palpation without crepitus of the thoracic region noted. There was evidence of mild  to moderate muscle spasm involving the lower thoracic paraspinal musculature region. Palpation over the lumbar paraspinal musculatures and lumbar facet region was attends to palpation of moderate degree with lateral bending and extension and palpation of the lumbar facets reproducing moderate discomfort. Straight leg raising was tolerates approximately 20 on the right and 30 on the left. EHL strength was questionably decreased. No definite sensory deficit or dermatomal distribution was detected. DTRs were difficult to elicit. Knees were with tends to palpation with crepitus of the knees with negative anterior and posterior drawer signs without ballottement of the patella. Palpation over the PSIS and PII S regions reproduce moderate discomfort with mild tenderness along the greater trochanteric region iliotibial band region. The abdomen was nontender and no costovertebral tenderness was noted.      Assessment & Plan:     Degenerative disc disease lumbar spine  T11-12: Negative.  T12-L1: Minimal disc bulge. The central canal and foramina are widely patent.  L1-2: Mild facet degenerative disease. Otherwise negative.  L2-3: Mild facet degenerative change. Otherwise negative.  L3-4: Moderate facet arthropathy is present and there is a shallow disc bulge. There is mild marrow edema in the L3 and L4 pedicles consistent with stress change related to facet arthropathy. The central spinal canal and neural foramina remain widely patent.  L4-5: Bilateral facet degenerative change is present. The disc is uncovered without bulging. The central canal and foramina remain widely patent.  L5-S1: The patient has a disc bulge eccentric to the left and there is some facet degenerative disease. There is subarticular zone narrowing on the left which could impact the descending left S1 root. The thecal sac and foramina are widely patent.  IMPRESSION: Disc bulge to the  left and facet  arthropathy at L5-S1 result in subarticular narrowing which could impact the descending left S1 root. Mild disc bulging at additional levels of the lumbar spine as described above without central canal stenosis.  Facet arthropathy appears worst at L3-4 where there is some marrow edema in the pedicles bilaterally at each level consistent with stress change related to facet degeneration   Lumbar facet syndrome  Lumbar radiculopathy  Sacroiliac joint dysfunction     PLAN  Continue present medications  Lumbosacral selective nerve block to be performed at time of return appointment pending medical clearance by primary care physician  F/U PCP Emily Stokes for evaliation of  BP and general medical  condition. Blood pressure is elevated today. Please follow-up with primary care physician as discussed   F/U surgical evaluation. Neurosurgical evaluation of lower back and lower extremity pain and paresthesias Asked the secretary and nurses the date of your neurosurgical evaluation  F/U neurological evaluation. May consider pending follow-up evaluations. We may consider obtaining PNCV/EMG studies to evaluate pain going down the left lower extremity as discussed. We will proceed with neurosurgical evaluation at this time  May consider radiofrequency rhizolysis or intraspinal procedures pending response to present treatment and F/U evaluation   Patient to call Pain Management Center should patient have concerns prior to scheduled return appointment.

## 2015-10-19 NOTE — Therapy (Signed)
[facility] [address], [place], [address] Phone: [phone]   Fax:  [phone]  Physical Therapy Treatment   Patient Details  Name: Emily Stokes MRN: [id number] Date of Birth: [birth year] Referring Provider: Newman Pies, MD  Encounter Date: 10/19/2015      PT End of Session - 10/19/15 1441    Visit Number 3   Number of Visits 13   Date for PT Re-Evaluation 11/30/15   Authorization Type 3   Authorization Time Period of 10   PT Start Time 1441  patient arrived late   PT Stop Time 1530   PT Time Calculation (min) 49 min   Activity Tolerance Patient tolerated treatment well   Behavior During Therapy [facility] for tasks assessed/performed      Past Medical History  Diagnosis Date  . Bronchitis   . Hypercholesteremia   . Chicken pox   . Measles   . Excess, menstruation   . Chest pain   . Calculus, kidney   . Anemia   . Iron deficiency   . Abdominal pain, epigastric   . Circulatory disease   . Backache   . Pain in joint, lower leg   . Carpal tunnel syndrome   . Hypertension, essential, benign   . Nausea alone   . Malaise and fatigue   . Disorder of kidney and ureter   . Infected postoperative seroma   . Nonspecific abnormal electrocardiogram (ECG) (EKG)     Past Surgical History  Procedure Laterality Date  . Hernia repair    . Colonoscopy  1997  . Partial hysterectomy  2009     vaginal hysterectomy, has both ovaries  . Cesarean section    . Lithotripsy  0174    with complications, hospitalized due to these complications for 3 months    Filed Vitals:   10/19/15 1443  BP: 131/92  Pulse: 90        Subjective Assessment - 10/19/15 1443    Subjective Back does not feel as bad as my leg. Back is feeling a little better. 4/10 low back pain currently. 8/10 L LE pain currently. Pt also adds going to her pain doctor earlier and her blood pressure was high (bottom number). Her pain  doctor told her to see her regular doctor pertaining to her blood pressure. 8/10 back pain at worst, 10/10 L LE pain, and 8/10 L arm pain at worst for the past 2 weeks.   Pertinent History Chronic low back pain. L LE pain started initially, followed by her L hip, then her low back since her hysterectomy in 2010. Her ureter and intestines were cut in half. Had to wear a cholostomy bag for about a year on her L side. Pain has worsened since 2010 and pain is constant. Had and MRI for her low back which revealed arthritis, and a pinched nerve (sciatic nerve ?). MD also told pt that she had degenerative arthritis in her L hip, and arthritis in L leg with damaged nerves. Took gabapentin which did not help. Has an appointment with a pain doctor 08/30/2015.   L arm pain began since her surgery in 2010.  Has not had imaging for L arm  or neck.    Patient Stated Goals "I wish I could get all of it back to normal." No pain.    Currently in Pain? Yes   Pain Score 8   4/10 low back, 8/10 L LE   Pain  Onset More than a month ago           [facility] PT Assessment - 10/19/15 1501    Strength   Right Hip Flexion 4-/5   Left Hip Flexion 3+/5  but with L lateral thigh pain            Objectives:  There-ex  Vitals obtained.  Directed patient with standing L shoulder adduction resisting red band 10x3 with 5 second holds   Standing bilateral shoulder extension resisting red band 10x2 with 5 second holds with transversus abdominis, pelvic floor, and glute muscle contraction  Decreased L LE symptoms.  Seated manually resisted hip flexion 1x each LE to measure strength  L lateral thigh discomfort which decreased after activation of L glute max (seated L hip extension isometrics)    Seated L hip extension isometrics 10x3 with 5 second holds (decreased L lateral thigh discomfort)  Seated bilateral ankle DF/PF 10x3 to promote neural mobility/flossing  Seated hip adductor ball squeeze 10x5 seconds for 3  sets  Reviewed HEP  Standing R and L weight shifting to activate glute med muscles 10x each side     Improved exercise technique, movement at target joints, use of target muscles after mod verbal, visual, tactile cues.     Patient has been only able to attend physical therapy 3 times total. Back pain and L LE symptoms seem to improve when utilizing muscles to decrease R lateral shift, improve lumbopelvic stability, as well as activating her glute max, transversus abdominis and pelvic floor muscles, and decreasing L piriformis muscle use. Patient still demonstrates low back, L UE, and L LE pain, weakness, and difficulty walking and performing standing tasks and would benefit from skilled physical therapy to address the aforementioned deficits.               PT Education - 10/19/15 1944    Education provided Yes   Education Details ther-ex, HEP, plan of care   Person(s) Educated Patient   Methods Explanation;Demonstration;Tactile cues;Verbal cues;Handout   Comprehension Verbalized understanding;Returned demonstration             PT Long Term Goals - 10/19/15 1945    PT LONG TERM GOAL #1   Title Patient will have a decrease in low back pain to 7/10 or less at worst to promote ability to stand and wash dishes and walk.   Baseline 10/10 low back pain at worst; 8/10 at worst for the past 2 weeks (10/19/2015)   Time 6   Period Weeks   Status On-going   PT LONG TERM GOAL #2   Title Patient will have a decrease in L hip pain to 7/10 or less at worst to promote ability to stand and wash dishes and walk.   Baseline 10/10 L hip pain at worst   Time 6   Period Weeks   Status On-going   PT LONG TERM GOAL #3   Title Patient will have a decrease in L arm pain to 5/10 or less at worst to promote ability to lay on her side at night   Baseline 10/10 L arm pain at worst;  8/10 at worst for the past 2 weeks (10/19/2015)   Time 6   Period Weeks   Status On-going   PT LONG TERM GOAL #4    Title Patient will improve bilateral hip strength by at least 1/2 MMT grade to promote ability to perform standing tasks.    Time 6   Period Weeks  Status Partially Met               Plan - 10/19/15 1944    Clinical Impression Statement Patient has been only able to attend physical therapy 3 times total. Back pain and L LE symptoms seem to improve when utilizing muscles to decrease R lateral shift, improve lumbopelvic stability, as well as activating her glute max, transversus abdominis and pelvic floor muscles, and decreasing L piriformis muscle use. Patient still demonstrates low back, L UE, and L LE pain, weakness, and difficulty walking and performing standing tasks and would benefit from skilled physical therapy to address the aforementioned deficits.    Rehab Potential Fair   Clinical Impairments Affecting Rehab Potential Chronicity of condition, pain   PT Frequency 2x / week   PT Duration 6 weeks   PT Treatment/Interventions Therapeutic exercise;Therapeutic activities;Manual techniques;Aquatic Therapy;Electrical Stimulation;Iontophoresis 64m/ml Dexamethasone;Neuromuscular re-education;Patient/family education   PT Next Visit Plan posture, strengthening, lumbopelvic stability    Consulted and Agree with Plan of Care Patient      Patient will benefit from skilled therapeutic intervention in order to improve the following deficits and impairments:  Pain, Decreased strength, Improper body mechanics, Difficulty walking, Postural dysfunction  Visit Diagnosis: Low back pain, unspecified back pain laterality, with sciatica presence unspecified - Plan: PT plan of care cert/re-cert  Muscle weakness (generalized) - Plan: PT plan of care cert/re-cert  Difficulty in walking, not elsewhere classified - Plan: PT plan of care cert/re-cert  Pain In Left Arm - Plan: PT plan of care cert/re-cert     Problem List Patient Active Problem List   Diagnosis Date Noted  . DDD (degenerative  disc disease), lumbar 09/14/2015  . Facet syndrome, lumbar 09/14/2015  . Lumbar radiculopathy 09/14/2015  . Sacroiliac joint dysfunction 09/14/2015  . History of chicken pox 02/02/2015  . Leg pain 02/02/2015  . Hypercholesteremia 02/02/2015  . Blood glucose elevated 02/02/2015  . Pain in soft tissues of limb 02/02/2015  . Nonspecific ST-T changes 02/02/2015  . Awareness of heartbeats 02/02/2015  . Pain in extremity at multiple sites 02/02/2015  . Snores 02/02/2015  . Hernia of anterior abdominal wall 02/24/2012  . Pain in shoulder 12/05/2011  . Adnexal mass 10/30/2011  . Palpitations 03/14/2011  . HTN (hypertension) 03/14/2011  . Abnormal finding on EKG 03/14/2011  . Obesity 03/14/2011  . Infected surgical wound 09/15/2008  . Urinary system disease 08/10/2008  . Benign essential HTN 07/12/2008  . Arthralgia of lower leg 03/28/2008  . Anemia, iron deficiency 06/17/2007  . Calculus of kidney 05/26/2007       MJoneen BoersPT, DPT   10/19/2015, 8:03 PM  [facility] [address] [place] [address]Phone: [phone]  Fax:  [phone] Name: DTIONNA GIGANTEMRN: [id number] of Birth: 709/06/1960

## 2015-10-19 NOTE — Patient Instructions (Signed)
For all exercises, tighten your abdominal muscles, and pelvic floor muscles.      Adduction: Hip - Knees Together (Sitting)   Sit with towel roll between knees. Push knees together. Hold for _5__ seconds. Repeat _10__ times. Do __3_ times a day.  Copyright  VHI. All rights reserved.      Hold tubing in left hand, arm out. Pull arm to your side. Do not twist or rotate trunk.  Hold for 5 seconds. Repeat ___10_ times per set. Do 2-3____ sets per session. Do _1___ sessions per day.  http://orth.exer.us/834   Copyright  VHI. All rights reserved.      Sitting on a chair, squeeze your rear end muscle and push your L heel onto the floor. Hold for 5 seconds. Repeat 10 times. Perform 3 sets daily.

## 2015-10-19 NOTE — Patient Instructions (Addendum)
Continue present medications  Lumbosacral selective nerve block to be performed at time of return appointment pending medical clearance by primary care physician  F/U PCP Gordy Councilman for evaliation of  BP and general medical  condition. Blood pressure is elevated today. Please follow-up with primary care physician as discussed   F/U surgical evaluation. Neurosurgical evaluation of lower back and lower extremity pain and paresthesias Asked the secretary and nurses the date of your neurosurgical evaluation  F/U neurological evaluation. May consider pending follow-up evaluations. We may consider obtaining PNCV/EMG studies to evaluate pain going down the left lower extremity as discussed. We will proceed with neurosurgical evaluation at this time  May consider radiofrequency rhizolysis or intraspinal procedures pending response to present treatment and F/U evaluation   Patient to call Pain Management Center should patient have concerns prior to scheduled return appointment.  Pt given preprocedure instructions.  Npo after midnight.  Take B/p med morning of procedure.  Bring a driver.  Information given on Selective nerve root block.  Medical clearance sent to [facility].

## 2015-10-23 DIAGNOSIS — R319 Hematuria, unspecified: Secondary | ICD-10-CM | POA: Diagnosis not present

## 2015-10-23 DIAGNOSIS — R300 Dysuria: Secondary | ICD-10-CM | POA: Diagnosis not present

## 2015-10-23 DIAGNOSIS — I10 Essential (primary) hypertension: Secondary | ICD-10-CM | POA: Diagnosis not present

## 2015-10-23 DIAGNOSIS — N390 Urinary tract infection, site not specified: Secondary | ICD-10-CM | POA: Diagnosis not present

## 2015-10-23 DIAGNOSIS — R3911 Hesitancy of micturition: Secondary | ICD-10-CM | POA: Diagnosis not present

## 2015-10-24 ENCOUNTER — Ambulatory Visit: Payer: Medicare Other

## 2015-10-26 ENCOUNTER — Ambulatory Visit: Payer: Medicare Other

## 2015-10-31 ENCOUNTER — Ambulatory Visit: Payer: Medicare Other

## 2015-11-06 ENCOUNTER — Ambulatory Visit: Payer: Medicare Other | Attending: Pain Medicine | Admitting: Pain Medicine

## 2015-11-06 ENCOUNTER — Encounter: Payer: Self-pay | Admitting: Pain Medicine

## 2015-11-06 DIAGNOSIS — M5416 Radiculopathy, lumbar region: Secondary | ICD-10-CM

## 2015-11-06 DIAGNOSIS — M545 Low back pain: Secondary | ICD-10-CM | POA: Insufficient documentation

## 2015-11-06 DIAGNOSIS — M5126 Other intervertebral disc displacement, lumbar region: Secondary | ICD-10-CM | POA: Insufficient documentation

## 2015-11-06 DIAGNOSIS — M5136 Other intervertebral disc degeneration, lumbar region: Secondary | ICD-10-CM

## 2015-11-06 DIAGNOSIS — M47896 Other spondylosis, lumbar region: Secondary | ICD-10-CM | POA: Insufficient documentation

## 2015-11-06 DIAGNOSIS — M47816 Spondylosis without myelopathy or radiculopathy, lumbar region: Secondary | ICD-10-CM

## 2015-11-06 DIAGNOSIS — M79605 Pain in left leg: Secondary | ICD-10-CM | POA: Diagnosis present

## 2015-11-06 DIAGNOSIS — M5124 Other intervertebral disc displacement, thoracic region: Secondary | ICD-10-CM | POA: Insufficient documentation

## 2015-11-06 DIAGNOSIS — M1288 Other specific arthropathies, not elsewhere classified, other specified site: Secondary | ICD-10-CM | POA: Insufficient documentation

## 2015-11-06 DIAGNOSIS — M79604 Pain in right leg: Secondary | ICD-10-CM | POA: Diagnosis present

## 2015-11-06 DIAGNOSIS — M533 Sacrococcygeal disorders, not elsewhere classified: Secondary | ICD-10-CM

## 2015-11-06 MED ORDER — TRIAMCINOLONE ACETONIDE 40 MG/ML IJ SUSP
INTRAMUSCULAR | Status: AC
  Administered 2015-11-06: 13:00:00
  Filled 2015-11-06: qty 1

## 2015-11-06 MED ORDER — BUPIVACAINE HCL (PF) 0.25 % IJ SOLN
INTRAMUSCULAR | Status: AC
  Administered 2015-11-06: 13:00:00
  Filled 2015-11-06: qty 30

## 2015-11-06 MED ORDER — MIDAZOLAM HCL 5 MG/5ML IJ SOLN
5.0000 mg | Freq: Once | INTRAMUSCULAR | Status: AC
  Administered 2015-11-06: 3 mg via INTRAVENOUS
  Filled 2015-11-06: qty 5

## 2015-11-06 MED ORDER — CEFAZOLIN IN D5W 1 GM/50ML IV SOLN
1.0000 g | Freq: Once | INTRAVENOUS | Status: AC
  Administered 2015-11-06: 1 g via INTRAVENOUS

## 2015-11-06 MED ORDER — LACTATED RINGERS IV SOLN
1000.0000 mL | INTRAVENOUS | Status: DC
  Administered 2015-11-06: 1000 mL via INTRAVENOUS

## 2015-11-06 MED ORDER — CEFUROXIME AXETIL 250 MG PO TABS: 250.0000 mg | ORAL_TABLET | Freq: Two times a day (BID) | ORAL | Status: DC

## 2015-11-06 MED ORDER — CEFAZOLIN SODIUM 1 G IJ SOLR
INTRAMUSCULAR | Status: AC
  Administered 2015-11-06: 12:00:00
  Filled 2015-11-06: qty 10

## 2015-11-06 MED ORDER — FENTANYL CITRATE (PF) 100 MCG/2ML IJ SOLN
100.0000 ug | Freq: Once | INTRAMUSCULAR | Status: AC
  Administered 2015-11-06: 50 ug via INTRAVENOUS
  Filled 2015-11-06: qty 2

## 2015-11-06 MED ORDER — BUPIVACAINE HCL (PF) 0.25 % IJ SOLN
30.0000 mL | Freq: Once | INTRAMUSCULAR | Status: AC
Start: 2015-11-06 — End: 2015-11-06
  Administered 2015-11-06: 30 mL
  Filled 2015-11-06: qty 30

## 2015-11-06 MED ORDER — TRIAMCINOLONE ACETONIDE 40 MG/ML IJ SUSP
40.0000 mg | Freq: Once | INTRAMUSCULAR | Status: AC
  Administered 2015-11-06: 40 mg
  Filled 2015-11-06: qty 1

## 2015-11-06 NOTE — Progress Notes (Signed)
Patient here today for procedure d/t left leg pain;. Safety precautions to be maintained throughout the outpatient stay will include: orient to surroundings, keep bed in low position, maintain call bell within reach at all times, provide assistance with transfer out of bed and ambulation.

## 2015-11-06 NOTE — Progress Notes (Signed)
Subjective:    Patient ID: Emily Stokes, female    DOB: [birth year], 55 y.o.   MRN: [id number]  HPI  PROCEDURE PERFORMED: Lumbosacral selective nerve root block   NOTE: The patient is a 55 y.o. female who returns to [facility] for further evaluation and treatment of pain involving the lumbar and lower extremity regions. . Studies consisting of MRI has revealed the patient to be with evidence of T12-L1: Minimal disc bulge. The central canal and foramina are widely patent.  L1-2: Mild facet degenerative disease. Otherwise negative.  L2-3: Mild facet degenerative change. Otherwise negative.  L3-4: Moderate facet arthropathy is present and there is a shallow disc bulge. There is mild marrow edema in the L3 and L4 pedicles consistent with stress change related to facet arthropathy. The central spinal canal and neural foramina remain widely patent.  L4-5: Bilateral facet degenerative change is present. The disc is uncovered without bulging. The central canal and foramina remain widely patent.  L5-S1: The patient has a disc bulge eccentric to the left and there is some facet degenerative disease. There is subarticular zone narrowing on the left which could impact the descending left S1 root. The thecal sac and foramina are widely patent.  IMPRESSION: Disc bulge to the left and facet arthropathy at L5-S1 result in subarticular narrowing which could impact the descending left S1 root. Mild disc bulging at additional levels of the lumbar spine as described above without central canal stenosis.  Facet arthropathy appears worst at L3-4 where there is some marrow edema in the pedicles bilaterally at each level consistent with stress change related to facet degeneration. The patient states the pain radiates to the left lower extremity more than the right lower extremity There is concern regarding intraspinal abnormalities contributing to the patient's symptomatology.  There is concern regarding significant component of patient's pain being due to lumbar radiculopathy The risks, benefits, and expectations of the procedure have been explained to the patient who was understanding and in agreement with suggested treatment plan. We will proceed with interventional treatment as discussed and as explained to the patient. The patient is understanding and in agreement with suggested treatment plan.   DESCRIPTION OF PROCEDURE: Lumbosacral selective nerve root block with IV Versed, IV fentanyl conscious sedation, EKG, blood pressure, pulse, capnography, and pulse oximetry monitoring. The procedure was performed with the patient in the prone position under fluoroscopic guidance. With the patient in the prone position, Betadine prep of proposed entry site was performed. Local anesthetic skin wheal of proposed needle entry site was prepared with 1.5% plain lidocaine with AP view of the lumbosacral spine.   PROCEDURE #1: Needle placement at the left L 2 vertebral body: A 22 -gauge needle was inserted at the inferior border of the transverse process of the vertebral body with needle placed medial to the midline of the transverse process on AP view of the lumbosacral spine.   NEEDLE PLACEMENT AT  L3, L4, and L5  VERTEBRAL BODY LEVELS  Needle  placement was accomplished at L3, L4, and L5  vertebral body levels on the left side exactly as was accomplished at the L2  vertebral body level  and utilizing the same technique and under fluoroscopic guidance.   Needle placement was then verified on lateral view at all levels with needle tip documented to be in the posterior superior quadrant of the intervertebral foramen of  L 2, L3, L4, and L5. Following negative aspiration for heme and CSF at each level, each  level was injected with 3 mL of 0.25% bupivacaine with Kenalog. Needles were placed on the right side and were removed since decision was to perform the lumbosacral selective nerve root  block on the left side only on today's visit   The patient tolerated the procedure well.    A total of 10 mg of Kenalog was utilized for the procedure.   PLAN:  1. Medications: Will continue presently prescribed medications. 2. The patient is to undergo follow-up evaluation with PCP Chrissmon for evaluation of blood pressure and general medical condition status post procedure performed on today's visit. The patient is to follow-up with PCP Chrissmon this week for evaluation of blood pressure and general medical condition following steroid injection as discussed 3.  Surgical follow-up evaluation as discussed 4. Neurological evaluation. Has been addressed 5. May consider radiofrequency procedures, implantation type procedures and other treatment pending response to treatment and follow-up evaluation. 6. The patient has been advise do adhere to proper body mechanics and avoid activities which may aggravate condition. 7. The patient has been advised to call the Pain Management Center prior to scheduled return appointment should there be significant change in the patient's condition or should the patient have other concerns regarding condition prior to scheduled return appointment.   Review of Systems     Objective:   Physical Exam        Assessment & Plan:

## 2015-11-06 NOTE — Patient Instructions (Signed)
PLAN  Continue present medication area please obtain Ceftin antibiotic and begin taking Ceftin antibiotic today as prescribed  F/U PCP Simona Huh Chrissmon for evaliation of  BP and general medical  condition  F/U surgical evaluation. Please schedule neurosurgical evaluation of lower back and lower extremity pain and paresthesias  F/U neurological evaluation. May consider pending follow-up evaluations. We may consider obtaining PNCV/EMG studies to evaluate pain going down the left lower extremity as discussed. We will proceed with neurosurgical evaluation at this time  May consider radiofrequency rhizolysis or intraspinal procedures pending response to present treatment and F/U evaluation   Patient to call Pain Management Center should patient have concerns prior to scheduled return appointment.

## 2015-11-07 ENCOUNTER — Telehealth: Payer: Self-pay | Admitting: *Deleted

## 2015-11-07 NOTE — Telephone Encounter (Signed)
Spoke with patient re; procedure on yesterday. Denies any questions or concerns.  

## 2015-11-27 ENCOUNTER — Encounter: Payer: Self-pay | Admitting: Pain Medicine

## 2015-11-27 ENCOUNTER — Ambulatory Visit: Payer: Medicare Other | Attending: Pain Medicine | Admitting: Pain Medicine

## 2015-11-27 VITALS — BP 154/87 | HR 80 | Temp 98.0°F | Resp 16 | Ht 69.0 in | Wt 238.0 lb

## 2015-11-27 DIAGNOSIS — M5116 Intervertebral disc disorders with radiculopathy, lumbar region: Secondary | ICD-10-CM | POA: Insufficient documentation

## 2015-11-27 DIAGNOSIS — M1288 Other specific arthropathies, not elsewhere classified, other specified site: Secondary | ICD-10-CM | POA: Insufficient documentation

## 2015-11-27 DIAGNOSIS — M47816 Spondylosis without myelopathy or radiculopathy, lumbar region: Secondary | ICD-10-CM | POA: Insufficient documentation

## 2015-11-27 DIAGNOSIS — M5127 Other intervertebral disc displacement, lumbosacral region: Secondary | ICD-10-CM | POA: Insufficient documentation

## 2015-11-27 DIAGNOSIS — M545 Low back pain: Secondary | ICD-10-CM | POA: Diagnosis present

## 2015-11-27 DIAGNOSIS — M5416 Radiculopathy, lumbar region: Secondary | ICD-10-CM

## 2015-11-27 DIAGNOSIS — M47817 Spondylosis without myelopathy or radiculopathy, lumbosacral region: Secondary | ICD-10-CM | POA: Diagnosis not present

## 2015-11-27 DIAGNOSIS — M79606 Pain in leg, unspecified: Secondary | ICD-10-CM | POA: Diagnosis present

## 2015-11-27 DIAGNOSIS — M5136 Other intervertebral disc degeneration, lumbar region: Secondary | ICD-10-CM

## 2015-11-27 DIAGNOSIS — M5124 Other intervertebral disc displacement, thoracic region: Secondary | ICD-10-CM | POA: Diagnosis not present

## 2015-11-27 DIAGNOSIS — M791 Myalgia: Secondary | ICD-10-CM | POA: Diagnosis not present

## 2015-11-27 DIAGNOSIS — M533 Sacrococcygeal disorders, not elsewhere classified: Secondary | ICD-10-CM | POA: Insufficient documentation

## 2015-11-27 NOTE — Progress Notes (Signed)
Subjective:    Patient ID: Emily Stokes, female    DOB: 04-10-1961, 55 y.o.   MRN: [id number]  HPI  The patient is a 55 year old female who returns to pain management for further evaluation and treatment of pain involving the lumbar lower extremity region especially the left lower extremity region. The patient is status post lumbosacral selective nerve root block with transient improvement of pain. We reviewed MRI findings again on today's visit and will proceed with lumbar epidural steroid injection to be performed at time return appointment. We also discussed the need for patient to undergo neurosurgical evaluation of neurosurgical evaluation as well. The patient was with understanding and agreement suggested treatment plan. The patient denied any trauma change in events of daily living the call significant change in symptomatology. We will observe response to lumbar epidural steroid injection and proceed with neurosurgical evaluation as discussed. All agreed to suggested treatment plan  Review of Systems     Objective:   Physical Exam  There was tenderness over the paraspinal musculatures and the cervical region cervical facet region with tenderness of the splenius capitis and occipitalis regions. The patient was with unremarkable Spurling's maneuver. Palpation of the acromioclavicular and glenohumeral joint regions reproduced pain of moderate degree there was tenderness over the region of the cervical and thoracic facet region a moderate degree. The patient appeared to be with slightly decreased grip strength and Tinel and Phalen's maneuver were without increase of pain of significant degree. There was no crepitus of the thoracic region noted. Palpation over the lumbar region was associated with increased pain with lateral bending rotation extension and palpation of the lumbar facets reproducing severe discomfort. Palpation over the PSIS and PII S region reproduces moderate discomfort as well  there was mild tenderness of the greater trochanteric region iliotibial band region. Straight leg raising was tolerates approximately 20 without increased pain with dorsiflexion noted. There was negative clonus negative Homans. There was question decreased sensation along the L5-S1 dermatomal distribution. Abdomen was nontender with no costovertebral tenderness noted          Assessment & Plan:      Degenerative disc disease lumbar spine  T11-12: Negative.  T12-L1: Minimal disc bulge. The central canal and foramina are widely patent.  L1-2: Mild facet degenerative disease. Otherwise negative.  L2-3: Mild facet degenerative change. Otherwise negative.  L3-4: Moderate facet arthropathy is present and there is a shallow disc bulge. There is mild marrow edema in the L3 and L4 pedicles consistent with stress change related to facet arthropathy. The central spinal canal and neural foramina remain widely patent.  L4-5: Bilateral facet degenerative change is present. The disc is uncovered without bulging. The central canal and foramina remain widely patent.  L5-S1: The patient has a disc bulge eccentric to the left and there is some facet degenerative disease. There is subarticular zone narrowing on the left which could impact the descending left S1 root. The thecal sac and foramina are widely patent.  IMPRESSION: Disc bulge to the left and facet arthropathy at L5-S1 result in subarticular narrowing which could impact the descending left S1 root. Mild disc bulging at additional levels of the lumbar spine as described above without central canal stenosis.  Facet arthropathy appears worst at L3-4 where there is some marrow edema in the pedicles bilaterally at each level consistent with stress change related to facet degeneration   Lumbar facet syndrome  Lumbar radiculopathy  Sacroiliac joint dysfunction       PLAN  Continue present  medications  Lumbar epidural steroid injection to be performed at time return appointment  F/U PCP Gordy Councilman for evaliation of  BP and general medical  condition.   F/U surgical evaluation. Neurosurgical evaluation of lower back and lower extremity pain and paresthesias as discussed Asked the secretary and nurses the date of your neurosurgical evaluation  F/U neurological evaluation. May consider pending follow-up evaluations. We may consider obtaining PNCV/EMG studies to evaluate pain going down the left lower extremity as discussed. We will proceed with neurosurgical evaluation at this time  May consider radiofrequency rhizolysis or intraspinal procedures pending response to present treatment and F/U evaluation   Patient to call Pain Management Center should patient have concerns prior to scheduled return appointment.

## 2015-11-27 NOTE — Progress Notes (Signed)
Patient here for post procedure evaluation. Safety precautions to be maintained throughout the outpatient stay will include: orient to surroundings, keep bed in low position, maintain call bell within reach at all times, provide assistance with transfer out of bed and ambulation.

## 2015-11-27 NOTE — Patient Instructions (Addendum)
PLAN   Continue present medications  Lumbar epidural steroid injection to be performed at time return appointment  F/U PCP Gordy Councilman for evaliation of  BP and general medical  condition.   F/U surgical evaluation. Neurosurgical evaluation of lower back and lower extremity pain and paresthesias as discussed Asked the secretary and nurses the date of your neurosurgical evaluation  F/U neurological evaluation. May consider pending follow-up evaluations. We may consider obtaining PNCV/EMG studies to evaluate pain going down the left lower extremity as discussed. We will proceed with neurosurgical evaluation at this time  May consider radiofrequency rhizolysis or intraspinal procedures pending response to present treatment and F/U evaluation   Patient to call Pain Management Center should patient have concerns prior to scheduled return appointment. Epidural Steroid Injection Patient Information  Description: The epidural space surrounds the nerves as they exit the spinal cord.  In some patients, the nerves can be compressed and inflamed by a bulging disc or a tight spinal canal (spinal stenosis).  By injecting steroids into the epidural space, we can bring irritated nerves into direct contact with a potentially helpful medication.  These steroids act directly on the irritated nerves and can reduce swelling and inflammation which often leads to decreased pain.  Epidural steroids may be injected anywhere along the spine and from the neck to the low back depending upon the location of your pain.   After numbing the skin with local anesthetic (like Novocaine), a small needle is passed into the epidural space slowly.  You may experience a sensation of pressure while this is being done.  The entire block usually last less than 10 minutes.  Conditions which may be treated by epidural steroids:   Low back and leg pain  Neck and arm pain  Spinal stenosis  Post-laminectomy syndrome  Herpes  zoster (shingles) pain  Pain from compression fractures  Preparation for the injection:  1. Do not eat any solid food or dairy products within 8 hours of your appointment.  2. You may drink clear liquids up to 3 hours before appointment.  Clear liquids include water, black coffee, juice or soda.  No milk or cream please. 3. You may take your regular medication, including pain medications, with a sip of water before your appointment  Diabetics should hold regular insulin (if taken separately) and take 1/2 normal NPH dos the morning of the procedure.  Carry some sugar containing items with you to your appointment. 4. A driver must accompany you and be prepared to drive you home after your procedure.  5. Bring all your current medications with your. 6. An IV may be inserted and sedation may be given at the discretion of the physician.   7. A blood pressure cuff, EKG and other monitors will often be applied during the procedure.  Some patients may need to have extra oxygen administered for a short period. 8. You will be asked to provide medical information, including your allergies, prior to the procedure.  We must know immediately if you are taking blood thinners (like Coumadin/Warfarin)  Or if you are allergic to IV iodine contrast (dye). We must know if you could possible be pregnant.  Possible side-effects:  Bleeding from needle site  Infection (rare, may require surgery)  Nerve injury (rare)  Numbness & tingling (temporary)  Difficulty urinating (rare, temporary)  Spinal headache ( a headache worse with upright posture)  Light -headedness (temporary)  Pain at injection site (several days)  Decreased blood pressure (temporary)  Weakness in  arm/leg (temporary)  Pressure sensation in back/neck (temporary)  Call if you experience:  Fever/chills associated with headache or increased back/neck pain.  Headache worsened by an upright position.  New onset weakness or numbness of  an extremity below the injection site  Hives or difficulty breathing (go to the emergency room)  Inflammation or drainage at the infection site  Severe back/neck pain  Any new symptoms which are concerning to you  Please note:  Although the local anesthetic injected can often make your back or neck feel good for several hours after the injection, the pain will likely return.  It takes 3-7 days for steroids to work in the epidural space.  You may not notice any pain relief for at least that one week.  If effective, we will often do a series of three injections spaced 3-6 weeks apart to maximally decrease your pain.  After the initial series, we generally will wait several months before considering a repeat injection of the same type.  If you have any questions, please call [phone] [place]  What are the risk, side effects and possible complications? Generally speaking, most procedures are safe.  However, with any procedure there are risks, side effects, and the possibility of complications.  The risks and complications are dependent upon the sites that are lesioned, or the type of nerve block to be performed.  The closer the procedure is to the spine, the more serious the risks are.  Great care is taken when placing the radio frequency needles, block needles or lesioning probes, but sometimes complications can occur. 1. Infection: Any time there is an injection through the skin, there is a risk of infection.  This is why sterile conditions are used for these blocks.  There are four possible types of infection. 1. Localized skin infection. 2. Central Nervous System Infection-This can be in the form of Meningitis, which can be deadly. 3. Epidural Infections-This can be in the form of an epidural abscess, which can cause pressure inside of the spine, causing compression of the spinal cord with subsequent paralysis. This  would require an emergency surgery to decompress, and there are no guarantees that the patient would recover from the paralysis. 4. Discitis-This is an infection of the intervertebral discs.  It occurs in about 1% of discography procedures.  It is difficult to treat and it may lead to surgery.        2. Pain: the needles have to go through skin and soft tissues, will cause soreness.       3. Damage to internal structures:  The nerves to be lesioned may be near blood vessels or    other nerves which can be potentially damaged.       4. Bleeding: Bleeding is more common if the patient is taking blood thinners such as  aspirin, Coumadin, Ticiid, Plavix, etc., or if he/she have some genetic predisposition  such as hemophilia. Bleeding into the spinal canal can cause compression of the spinal  cord with subsequent paralysis.  This would require an emergency surgery to  decompress and there are no guarantees that the patient would recover from the  paralysis.       5. Pneumothorax:  Puncturing of a lung is a possibility, every time a needle is introduced in  the area of the chest or upper back.  Pneumothorax refers to free air around the  collapsed lung(s), inside of the thoracic cavity (chest cavity).  Another two possible  complications related to a similar event would include: Hemothorax and Chylothorax.   These are variations of the Pneumothorax, where instead of air around the collapsed  lung(s), you may have blood or chyle, respectively.       6. Spinal headaches: They may occur with any procedures in the area of the spine.       7. Persistent CSF (Cerebro-Spinal Fluid) leakage: This is a rare problem, but may occur  with prolonged intrathecal or epidural catheters either due to the formation of a fistulous  track or a dural tear.       8. Nerve damage: By working so close to the spinal cord, there is always a possibility of  nerve damage, which could be as serious as a permanent spinal cord injury with   paralysis.       9. Death:  Although rare, severe deadly allergic reactions known as "Anaphylactic  reaction" can occur to any of the medications used.      10. Worsening of the symptoms:  We can always make thing worse.  What are the chances of something like this happening? Chances of any of this occuring are extremely low.  By statistics, you have more of a chance of getting killed in a motor vehicle accident: while driving to the hospital than any of the above occurring .  Nevertheless, you should be aware that they are possibilities.  In general, it is similar to taking a shower.  Everybody knows that you can slip, hit your head and get killed.  Does that mean that you should not shower again?  Nevertheless always keep in mind that statistics do not mean anything if you happen to be on the wrong side of them.  Even if a procedure has a 1 (one) in a 1,000,000 (million) chance of going wrong, it you happen to be that one..Also, keep in mind that by statistics, you have more of a chance of having something go wrong when taking medications.  Who should not have this procedure? If you are on a blood thinning medication (e.g. Coumadin, Plavix, see list of "Blood Thinners"), or if you have an active infection going on, you should not have the procedure.  If you are taking any blood thinners, please inform your physician.  How should I prepare for this procedure?  Do not eat or drink anything at least six hours prior to the procedure.  Bring a driver with you .  It cannot be a taxi.  Come accompanied by an adult that can drive you back, and that is strong enough to help you if your legs get weak or numb from the local anesthetic.  Take all of your medicines the morning of the procedure with just enough water to swallow them.  If you have diabetes, make sure that you are scheduled to have your procedure done first thing in the morning, whenever possible.  If you have diabetes, take only half of  your insulin dose and notify our nurse that you have done so as soon as you arrive at the clinic.  If you are diabetic, but only take blood sugar pills (oral hypoglycemic), then do not take them on the morning of your procedure.  You may take them after you have had the procedure.  Do not take aspirin or any aspirin-containing medications, at least eleven (11) days prior to the procedure.  They may prolong bleeding.  Wear loose fitting clothing that may be easy to  take off and that you would not mind if it got stained with Betadine or blood.  Do not wear any jewelry or perfume  Remove any nail coloring.  It will interfere with some of our monitoring equipment.  NOTE: Remember that this is not meant to be interpreted as a complete list of all possible complications.  Unforeseen problems may occur.  BLOOD THINNERS The following drugs contain aspirin or other products, which can cause increased bleeding during surgery and should not be taken for 2 weeks prior to and 1 week after surgery.  If you should need take something for relief of minor pain, you may take acetaminophen which is found in Tylenol,m Datril, Anacin-3 and Panadol. It is not blood thinner. The products listed below are.  Do not take any of the products listed below in addition to any listed on your instruction sheet.  A.P.C or A.P.C with Codeine Codeine Phosphate Capsules #3 Ibuprofen Ridaura  ABC compound Congesprin Imuran rimadil  Advil Cope Indocin Robaxisal  Alka-Seltzer Effervescent Pain Reliever and Antacid Coricidin or Coricidin-D  Indomethacin Rufen  Alka-Seltzer plus Cold Medicine Cosprin Ketoprofen S-A-C Tablets  Anacin Analgesic Tablets or Capsules Coumadin Korlgesic Salflex  Anacin Extra Strength Analgesic tablets or capsules CP-2 Tablets Lanoril Salicylate  Anaprox Cuprimine Capsules Levenox Salocol  Anexsia-D Dalteparin Magan Salsalate  Anodynos Darvon compound Magnesium Salicylate Sine-off  Ansaid Dasin  Capsules Magsal Sodium Salicylate  Anturane Depen Capsules Marnal Soma  APF Arthritis pain formula Dewitt's Pills Measurin Stanback  Argesic Dia-Gesic Meclofenamic Sulfinpyrazone  Arthritis Bayer Timed Release Aspirin Diclofenac Meclomen Sulindac  Arthritis pain formula Anacin Dicumarol Medipren Supac  Analgesic (Safety coated) Arthralgen Diffunasal Mefanamic Suprofen  Arthritis Strength Bufferin Dihydrocodeine Mepro Compound Suprol  Arthropan liquid Dopirydamole Methcarbomol with Aspirin Synalgos  ASA tablets/Enseals Disalcid Micrainin Tagament  Ascriptin Doan's Midol Talwin  Ascriptin A/D Dolene Mobidin Tanderil  Ascriptin Extra Strength Dolobid Moblgesic Ticlid  Ascriptin with Codeine Doloprin or Doloprin with Codeine Momentum Tolectin  Asperbuf Duoprin Mono-gesic Trendar  Aspergum Duradyne Motrin or Motrin IB Triminicin  Aspirin plain, buffered or enteric coated Durasal Myochrisine Trigesic  Aspirin Suppositories Easprin Nalfon Trillsate  Aspirin with Codeine Ecotrin Regular or Extra Strength Naprosyn Uracel  Atromid-S Efficin Naproxen Ursinus  Auranofin Capsules Elmiron Neocylate Vanquish  Axotal Emagrin Norgesic Verin  Azathioprine Empirin or Empirin with Codeine Normiflo Vitamin E  Azolid Emprazil Nuprin Voltaren  Bayer Aspirin plain, buffered or children's or timed BC Tablets or powders Encaprin Orgaran Warfarin Sodium  Buff-a-Comp Enoxaparin Orudis Zorpin  Buff-a-Comp with Codeine Equegesic Os-Cal-Gesic   Buffaprin Excedrin plain, buffered or Extra Strength Oxalid   Bufferin Arthritis Strength Feldene Oxphenbutazone   Bufferin plain or Extra Strength Feldene Capsules Oxycodone with Aspirin   Bufferin with Codeine Fenoprofen Fenoprofen Pabalate or Pabalate-SF   Buffets II Flogesic Panagesic   Buffinol plain or Extra Strength Florinal or Florinal with Codeine Panwarfarin   Buf-Tabs Flurbiprofen Penicillamine   Butalbital Compound Four-way cold tablets Penicillin    Butazolidin Fragmin Pepto-Bismol   Carbenicillin Geminisyn Percodan   Carna Arthritis Reliever Geopen Persantine   Carprofen Gold's salt Persistin   Chloramphenicol Goody's Phenylbutazone   Chloromycetin Haltrain Piroxlcam   Clmetidine heparin Plaquenil   Cllnoril Hyco-pap Ponstel   Clofibrate Hydroxy chloroquine Propoxyphen         Before stopping any of these medications, be sure to consult the physician who ordered them.  Some, such as Coumadin (Warfarin) are ordered to prevent or treat serious conditions such as "deep thrombosis", "pumonary  embolisms", and other heart problems.  The amount of time that you may need off of the medication may also vary with the medication and the reason for which you were taking it.  If you are taking any of these medications, please make sure you notify your pain physician before you undergo any procedures.

## 2015-12-04 ENCOUNTER — Encounter: Payer: Self-pay | Admitting: Pain Medicine

## 2015-12-04 ENCOUNTER — Ambulatory Visit: Payer: Medicare Other | Attending: Pain Medicine | Admitting: Pain Medicine

## 2015-12-04 VITALS — BP 156/103 | HR 68 | Temp 98.2°F | Resp 14 | Ht 69.0 in | Wt 238.0 lb

## 2015-12-04 DIAGNOSIS — M545 Low back pain: Secondary | ICD-10-CM | POA: Diagnosis present

## 2015-12-04 DIAGNOSIS — M79606 Pain in leg, unspecified: Secondary | ICD-10-CM | POA: Diagnosis present

## 2015-12-04 DIAGNOSIS — M5136 Other intervertebral disc degeneration, lumbar region: Secondary | ICD-10-CM

## 2015-12-04 DIAGNOSIS — M5124 Other intervertebral disc displacement, thoracic region: Secondary | ICD-10-CM | POA: Diagnosis not present

## 2015-12-04 DIAGNOSIS — M5127 Other intervertebral disc displacement, lumbosacral region: Secondary | ICD-10-CM | POA: Insufficient documentation

## 2015-12-04 DIAGNOSIS — M47814 Spondylosis without myelopathy or radiculopathy, thoracic region: Secondary | ICD-10-CM | POA: Insufficient documentation

## 2015-12-04 DIAGNOSIS — M5416 Radiculopathy, lumbar region: Secondary | ICD-10-CM | POA: Diagnosis not present

## 2015-12-04 DIAGNOSIS — M47816 Spondylosis without myelopathy or radiculopathy, lumbar region: Secondary | ICD-10-CM

## 2015-12-04 DIAGNOSIS — M533 Sacrococcygeal disorders, not elsewhere classified: Secondary | ICD-10-CM

## 2015-12-04 MED ORDER — ORPHENADRINE CITRATE 30 MG/ML IJ SOLN
60.0000 mg | Freq: Once | INTRAMUSCULAR | Status: AC
  Administered 2015-12-04: 60 mg via INTRAMUSCULAR

## 2015-12-04 MED ORDER — LIDOCAINE HCL (PF) 1 % IJ SOLN
10.0000 mL | Freq: Once | INTRAMUSCULAR | Status: AC
  Administered 2015-12-04: 10 mL via SUBCUTANEOUS

## 2015-12-04 MED ORDER — FENTANYL CITRATE (PF) 100 MCG/2ML IJ SOLN
100.0000 ug | Freq: Once | INTRAMUSCULAR | Status: AC
  Administered 2015-12-04: 100 ug via INTRAVENOUS

## 2015-12-04 MED ORDER — MIDAZOLAM HCL 5 MG/5ML IJ SOLN
5.0000 mg | Freq: Once | INTRAMUSCULAR | Status: AC
  Administered 2015-12-04: 2 mg via INTRAVENOUS

## 2015-12-04 MED ORDER — LACTATED RINGERS IV SOLN
1000.0000 mL | INTRAVENOUS | Status: DC
  Administered 2015-12-04: 1000 mL via INTRAVENOUS

## 2015-12-04 MED ORDER — CEFAZOLIN IN D5W 1 GM/50ML IV SOLN
1.0000 g | Freq: Once | INTRAVENOUS | Status: AC
  Administered 2015-12-04: 1 g via INTRAVENOUS

## 2015-12-04 MED ORDER — BUPIVACAINE HCL (PF) 0.25 % IJ SOLN
30.0000 mL | Freq: Once | INTRAMUSCULAR | Status: AC
  Administered 2015-12-04: 30 mL

## 2015-12-04 MED ORDER — SODIUM CHLORIDE 0.9% FLUSH
20.0000 mL | Freq: Once | INTRAVENOUS | Status: AC
  Administered 2015-12-04: 20 mL

## 2015-12-04 MED ORDER — CEFUROXIME AXETIL 250 MG PO TABS: 250.0000 mg | ORAL_TABLET | Freq: Two times a day (BID) | ORAL | 0 refills | Status: DC

## 2015-12-04 MED ORDER — TRIAMCINOLONE ACETONIDE 40 MG/ML IJ SUSP
40.0000 mg | Freq: Once | INTRAMUSCULAR | Status: AC
  Administered 2015-12-04: 40 mg

## 2015-12-04 NOTE — Patient Instructions (Addendum)
PLAN  Continue present medication area please obtain Ceftin antibiotic and begin taking Ceftin antibiotic today as prescribed  F/U PCP Simona Huh Chrissmon for evaliation of  BP and general medical condition Please see primary care physician this week for evaluation of elevated blood pressure as discussed  F/U surgical evaluation. Please schedule neurosurgical evaluation of lower back and lower extremity pain and paresthesias  F/U neurological evaluation. May consider pending follow-up evaluations. We may consider obtaining PNCV/EMG studies to evaluate pain going down the left lower extremity as discussed. We will proceed with neurosurgical evaluation at this time  May consider radiofrequency rhizolysis or intraspinal procedures pending response to present treatment and F/U evaluation   Patient to call Pain Management Center should patient have concerns prior to scheduled return appointment.Pain Management Discharge Instructions  General Discharge Instructions :  If you need to reach your doctor call: Monday-Friday 8:00 am - 4:00 pm at [phone] or toll free [phone].  After clinic hours [phone] to have operator reach doctor.  Bring all of your medication bottles to all your appointments in the pain clinic.  To cancel or reschedule your appointment with Pain Management please remember to call 24 hours in advance to avoid a fee.  Refer to the educational materials which you have been given on: General Risks, I had my Procedure. Discharge Instructions, Post Sedation.  Post Procedure Instructions:  The drugs you were given will stay in your system until tomorrow, so for the next 24 hours you should not drive, make any legal decisions or drink any alcoholic beverages.  You may eat anything you prefer, but it is better to start with liquids then soups and crackers, and gradually work up to solid foods.  Please notify your doctor immediately if you have any unusual bleeding, trouble  breathing or pain that is not related to your normal pain.  Depending on the type of procedure that was done, some parts of your body may feel week and/or numb.  This usually clears up by tonight or the next day.  Walk with the use of an assistive device or accompanied by an adult for the 24 hours.  You may use ice on the affected area for the first 24 hours.  Put ice in a Ziploc bag and cover with a towel and place against area 15 minutes on 15 minutes off.  You may switch to heat after 24 hours.

## 2015-12-04 NOTE — Progress Notes (Signed)
PROCEDURE PERFORMED: Lumbar epidural steroid injection   NOTE: The patient is a 55 y.o. female who returns to [place] for further evaluation and treatment of pain involving the lumbar and lower extremity region. MRI revealed the patient to be with    Degenerative disc disease lumbar spine  T11-12: Negative.  T12-L1: Minimal disc bulge. The central canal and foramina are widely patent.  L1-2: Mild facet degenerative disease. Otherwise negative.  L2-3: Mild facet degenerative change. Otherwise negative.  L3-4: Moderate facet arthropathy is present and there is a shallow disc bulge. There is mild marrow edema in the L3 and L4 pedicles consistent with stress change related to facet arthropathy. The central spinal canal and neural foramina remain widely patent.  L4-5: Bilateral facet degenerative change is present. The disc is uncovered without bulging. The central canal and foramina remain widely patent.  L5-S1: The patient has a disc bulge eccentric to the left and there is some facet degenerative disease. There is subarticular zone narrowing on the left which could impact the descending left S1 root. The thecal sac and foramina are widely patent.  IMPRESSION: Disc bulge to the left and facet arthropathy at L5-S1 result in subarticular narrowing which could impact the descending left S1 root. Mild disc bulging at additional levels of the lumbar spine as described above without central canal stenosis.  Facet arthropathy appears worst at L3-4 where there is some marrow edema in the pedicles bilaterally at each level consistent with stress change related to facet degeneration. . There is concern regarding component of patient's pain being due to lumbar radiculopathy and stenosis with neurogenic claudication The risks, benefits, and expectations of the procedure have been discussed and explained to the patient who was understanding and in agreement with  suggested treatment plan. We will proceed with lumbar epidural steroid injection as discussed and as explained to the patient who is willing to proceed with procedure as planned.   DESCRIPTION OF PROCEDURE: Lumbar epidural steroid injection with IV Versed, IV fentanyl conscious sedation, EKG, blood pressure, pulse, capnography, and pulse oximetry monitoring. The procedure was performed with the patient in the prone position under fluoroscopic guidance. A local anesthetic skin wheal of 1.5% plain lidocaine was accomplished at proposed entry site. An 18-gauge Tuohy epidural needle was inserted at the L 4 vertebral body level left of the midline via loss-of-resistance technique with negative heme and negative CSF return. A total of 4 mL of Preservative-Free normal saline with 40 mg of Kenalog injected incrementally via epidurally placed needle. Needle was removed.  Myoneural block injections of the gluteal musculature region Following Betadine prep of proposed entry site a 22-gauge needle was inserted into the gluteal musculature region and following negative aspiration 2 cc of 0.25% bupivacaine with Norflex was injected for myoneural block injection of the gluteal musculature region times two.   A total of 40 mg of Kenalog was utilized for the procedure.   The patient tolerated the injection well.    PLAN:   1. Medications: We will continue presently prescribed medications. 2. Will consider modification of treatment regimen pending response to treatment rendered on today's visit and follow-up evaluation. 3. The patient is to follow-up with primary care physician Gordy Councilman  regarding blood pressure and general medical condition status post lumbar epidural steroid injection performed on today's visit.. The patient was repeatedly reminded to follow-up with her primary care physician this week for evaluation of blood pressure which was elevated. The patient stated that she had  not taken her  antihypertensive medication. The patient was encouraged to take her antihypertensive medication and to follow-up with her primary care physician immediately this week. The patient agreed with recommendations and plan  4. Surgical evaluation.Has been addressed  5. Neurological evaluation.May consider PNCV EMG studies and other studies  6. The patient may be a candidate for radiofrequency procedures, implantation device, and other treatment pending response to treatment and follow-up evaluation. 7. The patient has been advised to adhere to proper body mechanics and avoid activities which appear to aggravate condition. 8. The patient has been advised to call the Pain Management Center prior to scheduled return appointment should there be significant change in condition or should there be sign  The patient is understanding and agrees with the suggested  treatment plan

## 2015-12-05 ENCOUNTER — Telehealth: Payer: Self-pay | Admitting: *Deleted

## 2015-12-05 NOTE — Telephone Encounter (Signed)
Voicemail left with patient to call our office if there are questions or concerns re; procedure on yesterday.  

## 2016-01-02 ENCOUNTER — Ambulatory Visit: Payer: Medicare Other | Admitting: Pain Medicine

## 2016-01-08 ENCOUNTER — Encounter: Payer: Self-pay | Admitting: Pain Medicine

## 2016-01-08 ENCOUNTER — Ambulatory Visit: Payer: Medicare Other | Attending: Pain Medicine | Admitting: Pain Medicine

## 2016-01-08 VITALS — BP 122/82 | HR 71 | Temp 98.4°F | Resp 16 | Ht 69.0 in | Wt 235.0 lb

## 2016-01-08 DIAGNOSIS — M5136 Other intervertebral disc degeneration, lumbar region: Secondary | ICD-10-CM

## 2016-01-08 DIAGNOSIS — M5416 Radiculopathy, lumbar region: Secondary | ICD-10-CM

## 2016-01-08 DIAGNOSIS — M4726 Other spondylosis with radiculopathy, lumbar region: Secondary | ICD-10-CM | POA: Insufficient documentation

## 2016-01-08 DIAGNOSIS — M533 Sacrococcygeal disorders, not elsewhere classified: Secondary | ICD-10-CM | POA: Insufficient documentation

## 2016-01-08 DIAGNOSIS — M47817 Spondylosis without myelopathy or radiculopathy, lumbosacral region: Secondary | ICD-10-CM | POA: Diagnosis not present

## 2016-01-08 DIAGNOSIS — M47816 Spondylosis without myelopathy or radiculopathy, lumbar region: Secondary | ICD-10-CM

## 2016-01-08 DIAGNOSIS — M5116 Intervertebral disc disorders with radiculopathy, lumbar region: Secondary | ICD-10-CM | POA: Diagnosis not present

## 2016-01-08 DIAGNOSIS — M1288 Other specific arthropathies, not elsewhere classified, other specified site: Secondary | ICD-10-CM | POA: Diagnosis not present

## 2016-01-08 DIAGNOSIS — M791 Myalgia: Secondary | ICD-10-CM | POA: Diagnosis not present

## 2016-01-08 DIAGNOSIS — M545 Low back pain: Secondary | ICD-10-CM | POA: Diagnosis present

## 2016-01-08 NOTE — Progress Notes (Signed)
The patient is a 55 year old female who returns to pain management for further evaluation and treatment of pain involving the lower back lower extremity region with pain involving the left lower extremity region predominantly. The patient's pain is aggravated by standing walking and becomes more intense as patient spends time on the feet. The patient states that the pain radiates from the lower back region continuing to the foot. Patient denies any trauma change in events of daily living because change in symptomatology. We've discussed neurosurgical evaluation and schedule patient for neurosurgical evaluation. We will proceed with lumbosacral selective nerve root block to be performed at time return appointment and will consider additional modifications of treatment regimen pending follow-up evaluation. The patient preferred to avoid receiving medications for treatment of her pain. We will proceed with lumbosacral selective nerve root block to be performed at time of return appointment. All agreed to suggested treatment plan.      Physical examination  Palpation of the splenius capitis and occipitalis region reproduced mild discomfort. There was mild tenderness of the acromioclavicular and glenohumeral joint region and patient was at unremarkable drop test and unremarkable Spurling's maneuver. Palpation over the region of the cervical facet and thoracic facet regions reproduced pain of minimal degree. There was minimal tenderness of the region of the trapezius levator scapula and rhomboid musculature regions. There was no crepitus of the thoracic region noted. Palpation over the lumbar paraspinal musculatures and lumbar facet region was of increased pain with lateral bending rotation extension and palpation over the lumbar facets palpation which reproduces moderate discomfort left greater than the right. There was decreased straight leg raising on the left tolerates approximately 20 without with  questionably increased pain with dorsiflexion noted. EHL strength appeared to be decreased. There was negative clonus negative Homans. Palpation over the PSIS and PII S region reproduced moderate discomfort with mild tenderness of the greater trochanteric region iliotibial band region. There was no definite sensory deficit of dermatomal distribution detected. There was negative clonus negative Homans. Abdomen was nontender with no costovertebral angle tenderness noted.     Assessment     Degenerative disc disease lumbar spine  T11-12: Negative.  T12-L1: Minimal disc bulge. The central canal and foramina are widely patent.  L1-2: Mild facet degenerative disease. Otherwise negative.  L2-3: Mild facet degenerative change. Otherwise negative.  L3-4: Moderate facet arthropathy is present and there is a shallow disc bulge. There is mild marrow edema in the L3 and L4 pedicles consistent with stress change related to facet arthropathy. The central spinal canal and neural foramina remain widely patent.  L4-5: Bilateral facet degenerative change is present. The disc is uncovered without bulging. The central canal and foramina remain widely patent.  L5-S1: The patient has a disc bulge eccentric to the left and there is some facet degenerative disease. There is subarticular zone narrowing on the left which could impact the descending left S1 root. The thecal sac and foramina are widely patent.  IMPRESSION: Disc bulge to the left and facet arthropathy at L5-S1 result in subarticular narrowing which could impact the descending left S1 root. Mild disc bulging at additional levels of the lumbar spine as described above without central canal stenosis.  Facet arthropathy appears worst at L3-4 where there is some marrow edema in the pedicles bilaterally at each level consistent with stress change related to facet degeneration  Lumbar radiculopathy  Lumbar facet  syndrome  Sacroiliac joint dysfunction       PLAN   Continue  present medications  Lumbosacral selective nerve root block to be performed at time of return appointment  F/U PCP Simona Huh Chrissmon for evaliation of  BP and general medical  condition.   F/U surgical evaluation. Neurosurgical evaluation of lower back and lower extremity pain and paresthesias as discussed Please ask the nurses and secretary the date of your neurosurgical evaluation  F/U neurological evaluation. May consider pending follow-up evaluations. We may consider obtaining PNCV/EMG studies to evaluate pain going down the left lower extremity as discussed. We will proceed with neurosurgical evaluation at this time  May consider radiofrequency rhizolysis or intraspinal procedures pending response to present treatment and F/U evaluation   Patient to call Pain Management Center should patient have concerns prior to scheduled return appointment

## 2016-01-08 NOTE — Patient Instructions (Addendum)
PLAN   Continue present medications  Lumbosacral selective nerve root block to be performed at time of return appointment  F/U PCP Gordy Councilman for evaliation of  BP and general medical  condition.   F/U surgical evaluation. Neurosurgical evaluation of lower back and lower extremity pain and paresthesias as discussed Please ask the nurses and secretary the date of your neurosurgical evaluation  F/U neurological evaluation. May consider pending follow-up evaluations. We may consider obtaining PNCV/EMG studies to evaluate pain going down the left lower extremity as discussed. We will proceed with neurosurgical evaluation at this time  May consider radiofrequency rhizolysis or intraspinal procedures pending response to present treatment and F/U evaluation   Patient to call Pain Management Center should patient have concerns prior to scheduled return appointment.selecSelective Nerve Root Block Patient Information  Description: Specific nerve roots exit the spinal canal and these nerves can be compressed and inflamed by a bulging disc and bone spurs.  By injecting steroids on the nerve root, we can potentially decrease the inflammation surrounding these nerves, which often leads to decreased pain.  Also, by injecting local anesthesia on the nerve root, this can provide [place] helpful information to give to your referring doctor if it decreases your pain.  Selective nerve root blocks can be done along the spine from the neck to the low back depending on the location of your pain.   After numbing the skin with local anesthesia, a small needle is passed to the nerve root and the position of the needle is verified using x-ray pictures.  After the needle is in correct position, we then deposit the medication.  You may experience a pressure sensation while this is being done.  The entire block usually lasts less than 15 minutes.  Conditions that may be treated with selective nerve root blocks:  Low back  and leg pain  Spinal stenosis  Diagnostic block prior to potential surgery  Neck and arm pain  Post laminectomy syndrome  Preparation for the injection:  1. Do not eat any solid food or dairy products within 8 hours of your appointment. 2. You may drink clear liquids up to 3 hours before an appointment.  Clear liquids include water, black coffee, juice or soda.  No milk or cream please. 3. You may take your regular medications, including pain medications, with a sip of water before your appointment.  Diabetics should hold regular insulin (if taken separately) and take 1/2 normal NPH dose the morning of the procedure.  Carry some sugar containing items with you to your appointment. 4. A driver must accompany you and be prepared to drive you home after your procedure. 5. Bring all your current medications with you. 6. An IV may be inserted and sedation may be given at the discretion of the physician. 7. A blood pressure cuff, EKG, and other monitors will often be applied during the procedure.  Some patients may need to have extra oxygen administered for a short period. 8. You will be asked to provide medical information, including allergies, prior to the procedure.  We must know immediately if you are taking blood  Thinners (like Coumadin) or if you are allergic to IV iodine contrast (dye).  Possible side-effects: All are usually temporary  Bleeding from needle site  Light headedness  Numbness and tingling  Decreased blood pressure  Weakness in arms/legs  Pressure sensation in back/neck  Pain at injection site (several days)  Possible complications: All are extremely rare  Infection  Nerve injury  Spinal headache (a headache wore with upright position)  Call if you experience:  Fever/chills associated with headache or increased back/neck pain  Headache worsened by an upright position  New onset weakness or numbness of an extremity below the injection  site  Hives or difficulty breathing (go to the emergency room)  Inflammation or drainage at the injection site(s)  Severe back/neck pain greater than usual  New symptoms which are concerning to you  Please note:  Although the local anesthetic injected can often make your back or neck feel good for several hours after the injection the pain will likely return.  It takes 3-5 days for steroids to work on the nerve root. You may not notice any pain relief for at least one week.  If effective, we will often do a series of 3 injections spaced 3-6 weeks apart to maximally decrease your pain.    If you have any questions, please call [phone] [facility]

## 2016-01-12 ENCOUNTER — Telehealth: Payer: Self-pay | Admitting: Family Medicine

## 2016-01-12 NOTE — Telephone Encounter (Signed)
Pt is requesting a letter on letterhead stating that she has health issues and problems with her arms and legs.  Pt states she needs this information for her son's school so her daughter can take him back and forth to school.  [id number]

## 2016-01-16 ENCOUNTER — Encounter: Payer: Self-pay | Admitting: Family Medicine

## 2016-01-16 ENCOUNTER — Ambulatory Visit (INDEPENDENT_AMBULATORY_CARE_PROVIDER_SITE_OTHER): Payer: Medicare Other | Admitting: Family Medicine

## 2016-01-16 ENCOUNTER — Telehealth: Payer: Self-pay

## 2016-01-16 VITALS — BP 114/70 | HR 72 | Temp 98.1°F | Resp 16 | Wt 247.2 lb

## 2016-01-16 DIAGNOSIS — M7712 Lateral epicondylitis, left elbow: Secondary | ICD-10-CM

## 2016-01-16 NOTE — Patient Instructions (Signed)
Increase ibuprofen to two pills every 6 hours. Ice your elbow area for 20 minutes 3-4 x day.

## 2016-01-16 NOTE — Telephone Encounter (Signed)
Attempted to reach patient, call went to a mailbox that was full.

## 2016-01-16 NOTE — Progress Notes (Signed)
Subjective:     Patient ID: Emily Stokes, female   DOB: [birth year], 55 y.o.   MRN: [id number]  HPI  Chief Complaint  Patient presents with  . Arm Pain    Patient comes in office today with complaints of right arm pain that started 2 days ago. Patient reports that pain begins in her wrist that radiates up to elbow. Patient states when she does overhead activities such as combing hair she deels a sharp pain radiate up into her shoulder. Patient reports taking otc Tylenol for relief.   She does not remember any specific injury or overuse. Followed by the pain clinic for chronic low back and left upper extremity pain. Review of Systems     Objective:   Physical Exam  Constitutional: She appears well-developed and well-nourished. No distress.  Musculoskeletal:  Tender over her right lateral epicondyle which is exacerbated by wrist extension and resistance to elbow flexion.      Assessment:    1. Epicondylitis, lateral (tennis elbow), left - Ambulatory referral to Orthopedic Surgery    Plan:    Discussed increasing ibuprofen to 400 mg. Every 6 hours pending referral.

## 2016-01-16 NOTE — Telephone Encounter (Signed)
Patient needs a note stating that she is a patient at this pain clinic and she is unable to take care of her son at this time.  Fax to Ms. Christiane Ha   [phone] Student services The patient wants her daughter to be able to care for her, the patients, son at this time due to all she has going on she is unable to take care of him herself. Her daughter will be taking her son to her school and they need verification that the patient in unable to care for him at this time before they will give permission for him to change schools.

## 2016-01-17 ENCOUNTER — Encounter: Payer: Self-pay | Admitting: *Deleted

## 2016-01-17 ENCOUNTER — Telehealth: Payer: Self-pay

## 2016-01-17 NOTE — Telephone Encounter (Signed)
Emily Stokes, Patient would like letter stating that she is unable to care for her son due to her condition as discussed I think the primary care physician should address this issue. I will be glad to state that patient has lumbar lower extremity condition that prevents her from performing strenuous activity on regular basis and hopefully this will be helpful. Thank you

## 2016-01-17 NOTE — Telephone Encounter (Signed)
Patient called back inquiring about the letter for her sons school. She wants to know if it has been faxed. She needs it done asap

## 2016-01-17 NOTE — Telephone Encounter (Signed)
Letter written and faxed to the number given. Patient notified.

## 2016-01-17 NOTE — Telephone Encounter (Signed)
Dr. Primus Bravo, can we do this?

## 2016-01-17 NOTE — Telephone Encounter (Signed)
Nurses Please address the issue regarding patient requesting letter. The patient could have her primary care physician address this issue as well. Please discussed with me. We can write letter stating her condition and need to limit physical activity if this will be helpful to her area Thank you,

## 2016-01-22 ENCOUNTER — Encounter: Payer: Self-pay | Admitting: Pain Medicine

## 2016-01-22 ENCOUNTER — Ambulatory Visit: Payer: Medicare Other | Attending: Pain Medicine | Admitting: Pain Medicine

## 2016-01-22 VITALS — BP 140/79 | HR 70 | Temp 97.6°F | Resp 18 | Ht 69.0 in | Wt 240.0 lb

## 2016-01-22 DIAGNOSIS — M5416 Radiculopathy, lumbar region: Secondary | ICD-10-CM | POA: Diagnosis not present

## 2016-01-22 DIAGNOSIS — M1288 Other specific arthropathies, not elsewhere classified, other specified site: Secondary | ICD-10-CM | POA: Insufficient documentation

## 2016-01-22 DIAGNOSIS — M47816 Spondylosis without myelopathy or radiculopathy, lumbar region: Secondary | ICD-10-CM | POA: Diagnosis not present

## 2016-01-22 DIAGNOSIS — M5415 Radiculopathy, thoracolumbar region: Secondary | ICD-10-CM

## 2016-01-22 DIAGNOSIS — M533 Sacrococcygeal disorders, not elsewhere classified: Secondary | ICD-10-CM

## 2016-01-22 DIAGNOSIS — M545 Low back pain: Secondary | ICD-10-CM | POA: Diagnosis present

## 2016-01-22 DIAGNOSIS — M5136 Other intervertebral disc degeneration, lumbar region: Secondary | ICD-10-CM

## 2016-01-22 DIAGNOSIS — M5116 Intervertebral disc disorders with radiculopathy, lumbar region: Secondary | ICD-10-CM | POA: Insufficient documentation

## 2016-01-22 DIAGNOSIS — M79606 Pain in leg, unspecified: Secondary | ICD-10-CM | POA: Diagnosis present

## 2016-01-22 MED ORDER — LACTATED RINGERS IV SOLN: 1000.0000 mL | INTRAVENOUS | Status: DC

## 2016-01-22 MED ORDER — CEFAZOLIN SODIUM 1 G IJ SOLR
INTRAMUSCULAR | Status: AC
  Administered 2016-01-22: 13:00:00
  Filled 2016-01-22: qty 10

## 2016-01-22 MED ORDER — BUPIVACAINE HCL (PF) 0.25 % IJ SOLN
30.0000 mL | Freq: Once | INTRAMUSCULAR | Status: AC
  Administered 2016-01-22: 30 mL
  Filled 2016-01-22: qty 30

## 2016-01-22 MED ORDER — ORPHENADRINE CITRATE 30 MG/ML IJ SOLN
60.0000 mg | Freq: Once | INTRAMUSCULAR | Status: AC
  Administered 2016-01-22: 60 mg via INTRAMUSCULAR
  Filled 2016-01-22: qty 2

## 2016-01-22 MED ORDER — CEFUROXIME AXETIL 250 MG PO TABS: 250.0000 mg | ORAL_TABLET | Freq: Two times a day (BID) | ORAL | 0 refills | Status: DC

## 2016-01-22 MED ORDER — CEFAZOLIN IN D5W 1 GM/50ML IV SOLN: 1.0000 g | Freq: Once | INTRAVENOUS | Status: DC

## 2016-01-22 MED ORDER — LIDOCAINE HCL (PF) 1 % IJ SOLN
10.0000 mL | Freq: Once | INTRAMUSCULAR | Status: AC
  Administered 2016-01-22: 10 mL via SUBCUTANEOUS

## 2016-01-22 MED ORDER — MIDAZOLAM HCL 5 MG/5ML IJ SOLN
5.0000 mg | Freq: Once | INTRAMUSCULAR | Status: AC
  Administered 2016-01-22: 3 mg via INTRAVENOUS
  Filled 2016-01-22: qty 5

## 2016-01-22 MED ORDER — TRIAMCINOLONE ACETONIDE 40 MG/ML IJ SUSP
40.0000 mg | Freq: Once | INTRAMUSCULAR | Status: AC
  Administered 2016-01-22: 40 mg
  Filled 2016-01-22: qty 1

## 2016-01-22 MED ORDER — FENTANYL CITRATE (PF) 100 MCG/2ML IJ SOLN
100.0000 ug | Freq: Once | INTRAMUSCULAR | Status: AC
  Administered 2016-01-22: 100 ug via INTRAVENOUS
  Filled 2016-01-22: qty 2

## 2016-01-22 NOTE — Patient Instructions (Addendum)
PLAN   Continue present medication.  Please get Ceftin antibiotic today and begin taking Ceftin antibiotic today as prescribed  F/U PCP Simona Huh Chrissmon for evaliation of  BP and general medical  condition.   F/U surgical evaluation. Neurosurgical evaluation of lower back and lower extremity pain and paresthesias as discussed Please ask the nurses and secretary the date of your neurosurgical evaluation  F/U neurological evaluation. May consider pending follow-up evaluations. We may consider obtaining PNCV/EMG studies to evaluate pain going down the left lower extremity as discussed. We will proceed with neurosurgical evaluation at this time  May consider radiofrequency rhizolysis or intraspinal procedures pending response to present treatment and F/U evaluation   Patient to call Pain Management Center should patient have concerns prior to scheduled return appointment  Selective Nerve Root Block Patient Information  Description: Specific nerve roots exit the spinal canal and these nerves can be compressed and inflamed by a bulging disc and bone spurs.  By injecting steroids on the nerve root, we can potentially decrease the inflammation surrounding these nerves, which often leads to decreased pain.  Also, by injecting local anesthesia on the nerve root, this can provide [place] helpful information to give to your referring doctor if it decreases your pain.  Selective nerve root blocks can be done along the spine from the neck to the low back depending on the location of your pain.   After numbing the skin with local anesthesia, a small needle is passed to the nerve root and the position of the needle is verified using x-ray pictures.  After the needle is in correct position, we then deposit the medication.  You may experience a pressure sensation while this is being done.  The entire block usually lasts less than 15 minutes.  Conditions that may be treated with selective nerve root blocks:  Low back  and leg pain  Spinal stenosis  Diagnostic block prior to potential surgery  Neck and arm pain  Post laminectomy syndrome  Preparation for the injection:  1. Do not eat any solid food or dairy products within 8 hours of your appointment. 2. You may drink clear liquids up to 3 hours before an appointment.  Clear liquids include water, black coffee, juice or soda.  No milk or cream please. 3. You may take your regular medications, including pain medications, with a sip of water before your appointment.  Diabetics should hold regular insulin (if taken separately) and take 1/2 normal NPH dose the morning of the procedure.  Carry some sugar containing items with you to your appointment. 4. A driver must accompany you and be prepared to drive you home after your procedure. 5. Bring all your current medications with you. 6. An IV may be inserted and sedation may be given at the discretion of the physician. 7. A blood pressure cuff, EKG, and other monitors will often be applied during the procedure.  Some patients may need to have extra oxygen administered for a short period. 8. You will be asked to provide medical information, including allergies, prior to the procedure.  We must know immediately if you are taking blood  Thinners (like Coumadin) or if you are allergic to IV iodine contrast (dye).  Possible side-effects: All are usually temporary  Bleeding from needle site  Light headedness  Numbness and tingling  Decreased blood pressure  Weakness in arms/legs  Pressure sensation in back/neck  Pain at injection site (several days)  Possible complications: All are extremely rare  Infection  Nerve  injury  Spinal headache (a headache wore with upright position)  Call if you experience:  Fever/chills associated with headache or increased back/neck pain  Headache worsened by an upright position  New onset weakness or numbness of an extremity below the injection  site  Hives or difficulty breathing (go to the emergency room)  Inflammation or drainage at the injection site(s)  Severe back/neck pain greater than usual  New symptoms which are concerning to you  Please note:  Although the local anesthetic injected can often make your back or neck feel good for several hours after the injection the pain will likely return.  It takes 3-5 days for steroids to work on the nerve root. You may not notice any pain relief for at least one week.  If effective, we will often do a series of 3 injections spaced 3-6 weeks apart to maximally decrease your pain.    If you have any questions, please call [phone] [facility]  Pain Management Discharge Instructions  General Discharge Instructions :  If you need to reach your doctor call: Monday-Friday 8:00 am - 4:00 pm at [phone] or toll free [phone].  After clinic hours [phone] to have operator reach doctor.  Bring all of your medication bottles to all your appointments in the pain clinic.  To cancel or reschedule your appointment with Pain Management please remember to call 24 hours in advance to avoid a fee.  Refer to the educational materials which you have been given on: General Risks, I had my Procedure. Discharge Instructions, Post Sedation.  Post Procedure Instructions:  The drugs you were given will stay in your system until tomorrow, so for the next 24 hours you should not drive, make any legal decisions or drink any alcoholic beverages.  You may eat anything you prefer, but it is better to start with liquids then soups and crackers, and gradually work up to solid foods.  Please notify your doctor immediately if you have any unusual bleeding, trouble breathing or pain that is not related to your normal pain.  Depending on the type of procedure that was done, some parts of your body may feel week and/or numb.  This usually clears up by tonight  or the next day.  Walk with the use of an assistive device or accompanied by an adult for the 24 hours.  You may use ice on the affected area for the first 24 hours.  Put ice in a Ziploc bag and cover with a towel and place against area 15 minutes on 15 minutes off.  You may switch to heat after 24 hours.

## 2016-01-22 NOTE — Progress Notes (Signed)
Patient here for procedure visit d.t left leg pain.  Safety precautions to be maintained throughout the outpatient stay will include: orient to surroundings, keep bed in low position, maintain call bell within reach at all times, provide assistance with transfer out of bed and ambulation.

## 2016-01-23 NOTE — Progress Notes (Signed)
PROCEDURE PERFORMED: Left Lumbosacral selective nerve root block  2 NOTE: The patient is a 55 y.o. female who returns to [facility] for further evaluation and treatment of pain involving the lumbar and lower extremity region. Studies consisting of MRI has revealed the patient to be with evidence of   Degenerative disc disease lumbar spine  T11-12: Negative.  T12-L1: Minimal disc bulge. The central canal and foramina are widely patent.  L1-2: Mild facet degenerative disease. Otherwise negative.  L2-3: Mild facet degenerative change. Otherwise negative.  L3-4: Moderate facet arthropathy is present and there is a shallow disc bulge. There is mild marrow edema in the L3 and L4 pedicles consistent with stress change related to facet arthropathy. The central spinal canal and neural foramina remain widely patent.  L4-5: Bilateral facet degenerative change is present. The disc is uncovered without bulging. The central canal and foramina remain widely patent.  L5-S1: The patient has a disc bulge eccentric to the left and there is some facet degenerative disease. There is subarticular zone narrowing on the left which could impact the descending left S1 root. The thecal sac and foramina are widely patent.  IMPRESSION: Disc bulge to the left and facet arthropathy at L5-S1 result in subarticular narrowing which could impact the descending left S1 root. Mild disc bulging at additional levels of the lumbar spine as described above without central canal stenosis.  Facet arthropathy appears worst at L3-4 where there is some marrow edema in the pedicles bilaterally at each level consistent with stress change related to facet degeneration. There is concern regarding intraspinal abnormalities contributing to the patient's symptomatology with concern regarding component of pain due to lumbar radiculopathy. The risks, benefits, and expectations of the procedure have been  explained to the patient who was understanding and in agreement with suggested treatment plan. We will proceed with interventional treatment as discussed and as explained to the patient. The patient is understanding and in agreement with suggested treatment plan.   DESCRIPTION OF PROCEDURE: Lumbosacral selective nerve root block with IV Versed, IV fentanyl conscious sedation, EKG, blood pressure, pulse, capnography, and pulse oximetry monitoring. The procedure was performed with the patient in the prone position under fluoroscopic guidance. With the patient in the prone position, Betadine prep of proposed entry site was performed. Local anesthetic skin wheal of proposed needle entry site was prepared with 1.5% plain lidocaine with AP view of the lumbosacral spine.   PROCEDURE #1: Needle placement at the left L 2 vertebral body: A 22 -gauge needle was inserted at the inferior border of the transverse process of the vertebral body with needle placed medial to the midline of the transverse process on AP view of the lumbosacral spine.   NEEDLE PLACEMENT AT  L3, L4, and L5  VERTEBRAL BODY LEVELS  Needle  placement was accomplished at L3, L4, and L5  vertebral body levels on the left side exactly as was accomplished at the L2  vertebral body level  and utilizing the same technique and under fluoroscopic guidance.  PROCEDURE #4: Needle placement at the S1 foramen. With the patient in the prone position with Betadine prep of proposed entry site accomplished, the S1 foramen was visualized under fluoroscopic guidance with AP view of the lumbosacral spine with cephalad orientation of the fluoroscope with local anesthetic skin wheal of 1.5% lidocaine of proposed needle entry site prepared. A 22-gauge needle was inserted S1 foramen under fluoroscopic guidance eliciting paresthesias radiating from the buttocks to the lower extremity after which  needle was slightly withdrawn.   Needle placement was then verified on  lateral view at all levels with needle tip documented to be in the posterior superior quadrant of the intervertebral foramen of  L 2, L3, L4, and L5. Following negative aspiration for heme and CSF at each level, each level was injected with 3 mL of 0.25% bupivacaine with Kenalog.  Myoneural block injections of the gluteal musculature region Following Betadine prep of proposed entry site a 22-gauge needle was inserted into the gluteal musculature region and following negative aspiration 2 cc of 0.25% bupivacaine with Norflex was injected for myoneural block injection of the gluteal musculature region times two.   The patient tolerated the procedure well.   A total of 10 mg of Kenalog was utilized for the procedure.   PLAN:  1. Medications: Will continue presently prescribed medications. 2. The patient is to undergo follow-up evaluation with PCP Dr Keturah Barre Chrissmon for evaluation of blood pressure and general medical condition status post procedure performed on today's visit. 3. Surgical follow-up evaluation. Patient will undergo further surgical evaluation as discussed 4. Neurological evaluation. [facility] EMG studies have been addressed 5. May consider radiofrequency procedures, implantation type procedures and other treatment pending response to treatment and follow-up evaluation. 6. The patient has been advised do adhere to proper body mechanics and avoid activities which may aggravate condition. 7. The patient has been advised to call the [facility] prior to scheduled return appointment should there be significant change in the patient's condition or should the patient have other concerns regarding condition prior to scheduled return appointment.

## 2016-01-29 ENCOUNTER — Encounter: Payer: Self-pay | Admitting: Family Medicine

## 2016-01-29 NOTE — Telephone Encounter (Signed)
Left message advising pt the letter is at the front desk.   Thanks,   -Mickel Baas

## 2016-01-29 NOTE — Telephone Encounter (Signed)
Advise patient letter will be at the front desk for pick up at any time.

## 2016-02-05 DIAGNOSIS — M7711 Lateral epicondylitis, right elbow: Secondary | ICD-10-CM | POA: Diagnosis not present

## 2016-02-05 DIAGNOSIS — M25529 Pain in unspecified elbow: Secondary | ICD-10-CM | POA: Insufficient documentation

## 2016-02-05 HISTORY — DX: Pain in unspecified elbow: M25.529

## 2016-02-15 ENCOUNTER — Other Ambulatory Visit: Payer: Self-pay | Admitting: Physician Assistant

## 2016-02-15 DIAGNOSIS — I10 Essential (primary) hypertension: Secondary | ICD-10-CM

## 2016-02-27 ENCOUNTER — Other Ambulatory Visit: Payer: Self-pay | Admitting: Physician Assistant

## 2016-02-27 DIAGNOSIS — I10 Essential (primary) hypertension: Secondary | ICD-10-CM

## 2016-05-29 ENCOUNTER — Emergency Department
Admission: EM | Admit: 2016-05-29 | Discharge: 2016-05-29 | Disposition: A | Discharge status: Home / Self Care | Payer: Medicare Other | Attending: Emergency Medicine | Admitting: Emergency Medicine

## 2016-05-29 ENCOUNTER — Encounter: Payer: Self-pay | Admitting: Emergency Medicine

## 2016-05-29 DIAGNOSIS — R51 Headache: Secondary | ICD-10-CM | POA: Diagnosis present

## 2016-05-29 DIAGNOSIS — J0101 Acute recurrent maxillary sinusitis: Secondary | ICD-10-CM | POA: Diagnosis not present

## 2016-05-29 DIAGNOSIS — I10 Essential (primary) hypertension: Secondary | ICD-10-CM | POA: Insufficient documentation

## 2016-05-29 DIAGNOSIS — J0100 Acute maxillary sinusitis, unspecified: Secondary | ICD-10-CM | POA: Insufficient documentation

## 2016-05-29 MED ORDER — OSELTAMIVIR PHOSPHATE 75 MG PO CAPS: 75.0000 mg | ORAL_CAPSULE | Freq: Two times a day (BID) | ORAL | 0 refills | Status: AC

## 2016-05-29 MED ORDER — AMOXICILLIN 500 MG PO CAPS: 1000.0000 mg | ORAL_CAPSULE | Freq: Three times a day (TID) | ORAL | 0 refills | Status: DC

## 2016-05-29 NOTE — ED Triage Notes (Signed)
Patient presents to the ED with painful face and swelling and redness under her eyes and to the bridge of her nose.  Patient reports nasal congestion as well x 2 days.  Patient is also complaining of a headache.  Patient is unsure of whether or not she has had a fever.

## 2016-05-30 DIAGNOSIS — R0981 Nasal congestion: Secondary | ICD-10-CM | POA: Diagnosis not present

## 2016-05-30 DIAGNOSIS — G894 Chronic pain syndrome: Secondary | ICD-10-CM | POA: Diagnosis not present

## 2016-05-30 DIAGNOSIS — Z79891 Long term (current) use of opiate analgesic: Secondary | ICD-10-CM | POA: Diagnosis not present

## 2016-05-30 DIAGNOSIS — L03211 Cellulitis of face: Secondary | ICD-10-CM | POA: Insufficient documentation

## 2016-05-30 DIAGNOSIS — I10 Essential (primary) hypertension: Secondary | ICD-10-CM | POA: Diagnosis not present

## 2016-05-30 DIAGNOSIS — M1990 Unspecified osteoarthritis, unspecified site: Secondary | ICD-10-CM | POA: Diagnosis not present

## 2016-05-30 DIAGNOSIS — L0390 Cellulitis, unspecified: Secondary | ICD-10-CM | POA: Diagnosis not present

## 2016-05-30 DIAGNOSIS — J340 Abscess, furuncle and carbuncle of nose: Secondary | ICD-10-CM | POA: Insufficient documentation

## 2016-05-30 DIAGNOSIS — G8929 Other chronic pain: Secondary | ICD-10-CM | POA: Diagnosis not present

## 2016-05-30 DIAGNOSIS — B9562 Methicillin resistant Staphylococcus aureus infection as the cause of diseases classified elsewhere: Secondary | ICD-10-CM | POA: Diagnosis not present

## 2016-05-30 DIAGNOSIS — R220 Localized swelling, mass and lump, head: Secondary | ICD-10-CM | POA: Diagnosis not present

## 2016-05-30 DIAGNOSIS — M5416 Radiculopathy, lumbar region: Secondary | ICD-10-CM | POA: Diagnosis not present

## 2016-05-30 HISTORY — DX: Cellulitis of face: L03.211

## 2016-05-30 NOTE — ED Provider Notes (Signed)
[facility] Emergency Department Provider Note  ____________________________________________  Time seen: Approximately 10:52 AM  I have reviewed the triage vital signs and the nursing notes.   HISTORY  Chief Complaint Facial Pain; Nasal Congestion; and Headache    HPI Emily Stokes is a 56 y.o. female presents to the emergency department with maxillary and frontal sinus tenderness, headache, purulent rhinorrhea, fatigue and myalgias. Patient states that she had an upper respiratory tract infection with rhinorrhea, congestion,  diarrhea and fatigue for the past 2 weeks. However, her symptoms worsened acutely 2 days ago. Patient states that she has had chills but has not evaluated her temperature. She is staying hydrated. She has noticed some diminished appetite. No recent travel. She has taken ibuprofen, which has not relieved her symptoms.   Past Medical History:  Diagnosis Date  . Abdominal pain, epigastric   . Anemia   . Backache   . Bronchitis   . Calculus, kidney   . Carpal tunnel syndrome   . Chest pain   . Chicken pox   . Circulatory disease   . Disorder of kidney and ureter   . Excess, menstruation   . Hypercholesteremia   . Hypertension, essential, benign   . Infected postoperative seroma   . Iron deficiency   . Malaise and fatigue   . Measles   . Nausea alone   . Nonspecific abnormal electrocardiogram (ECG) (EKG)   . Pain in joint, lower leg     Patient Active Problem List   Diagnosis Date Noted  . DDD (degenerative disc disease), lumbar 09/14/2015  . Facet syndrome, lumbar 09/14/2015  . Lumbar radiculopathy 09/14/2015  . Sacroiliac joint dysfunction 09/14/2015  . History of chicken pox 02/02/2015  . Leg pain 02/02/2015  . Hypercholesteremia 02/02/2015  . Blood glucose elevated 02/02/2015  . Pain in soft tissues of limb 02/02/2015  . Nonspecific ST-T changes 02/02/2015  . Awareness of heartbeats 02/02/2015  . Pain in extremity  at multiple sites 02/02/2015  . Snores 02/02/2015  . Hernia of anterior abdominal wall 02/24/2012  . Pain in shoulder 12/05/2011  . Adnexal mass 10/30/2011  . Palpitations 03/14/2011  . HTN (hypertension) 03/14/2011  . Abnormal finding on EKG 03/14/2011  . Obesity 03/14/2011  . Infected surgical wound 09/15/2008  . Urinary system disease 08/10/2008  . Benign essential HTN 07/12/2008  . Arthralgia of lower leg 03/28/2008  . Anemia, iron deficiency 06/17/2007  . Calculus of kidney 05/26/2007    Past Surgical History:  Procedure Laterality Date  . CESAREAN SECTION    . COLONOSCOPY  1997  . HERNIA REPAIR    . LITHOTRIPSY  0000000   with complications, hospitalized due to these complications for 3 months  . PARTIAL HYSTERECTOMY  2009    vaginal hysterectomy, has both ovaries    Prior to Admission medications   Medication Sig Start Date End Date Taking? Authorizing Provider  amLODipine (NORVASC) 10 MG tablet TAKE 1 TABLET (10 MG TOTAL) BY MOUTH DAILY. 02/27/16   Mar Daring, PA-C  amoxicillin (AMOXIL) 500 MG capsule Take 2 capsules (1,000 mg total) by mouth 3 (three) times daily. 05/29/16   Lannie Fields, PA-C  aspirin 81 MG tablet Take 81 mg by mouth daily.    Historical Provider, MD  cefUROXime (CEFTIN) 250 MG tablet Take 1 tablet (250 mg total) by mouth 2 (two) times daily with a meal. 01/22/16   Mohammed Kindle, MD  gabapentin (NEURONTIN) 300 MG capsule Take by mouth. Reported on  11/27/2015    Historical Provider, MD  ibuprofen (ADVIL,MOTRIN) 200 MG tablet Take 200 mg by mouth every 6 (six) hours as needed (patient takes 600 mg at night.). Reported on 09/14/2015    Historical Provider, MD  lidocaine (LIDODERM) 5 % APPLY 1 PATCH 12 HOURS ON AND 12 HOURS OFF 07/03/15   Clearnce Sorrel Burnette, PA-C  metoprolol tartrate (LOPRESSOR) 25 MG tablet TAKE 1 TABLET (25 MG TOTAL) BY MOUTH 2 (TWO) TIMES DAILY. 02/15/16   Mar Daring, PA-C  oseltamivir (TAMIFLU) 75 MG capsule Take 1 capsule  (75 mg total) by mouth 2 (two) times daily. 05/29/16 06/03/16  Lannie Fields, PA-C  tapentadol (NUCYNTA) 50 MG TABS tablet Take 50 mg by mouth every 6 (six) hours as needed. Reported on 11/27/2015    Historical Provider, MD    Allergies Patient has no known allergies.  Family History  Problem Relation Age of Onset  . Hypertension Father   . Stroke Father   . Diabetes Mother   . Hypertension Mother   . Diabetes Other     sibling  . Diabetes Other     sibling  . Diabetes Other     sibling  . Hypertension Other     sibling  . Hypertension Other     sibling  . Hypertension Other     sibling    Social History Social History  Substance Use Topics  . Smoking status: Never Smoker  . Smokeless tobacco: Never Used  . Alcohol use No     Review of Systems  Constitutional: Patient has had chills and fatigue.  Head: Patient has sinus tenderness.  Eyes: No visual changes. No discharge ENT: Patient has congestion and purulent rhinorrhea Cardiovascular: no chest pain. Respiratory: no cough. No SOB. Musculoskeletal: Patient has myalgias. Skin: Negative for rash, abrasions, lacerations, ecchymosis. Neurological: Patient has headache.  10-point ROS otherwise negative.  ____________________________________________   PHYSICAL EXAM:  VITAL SIGNS: ED Triage Vitals  Enc Vitals Group     BP 05/29/16 1309 (!) 156/93     Pulse Rate 05/29/16 1309 93     Resp 05/29/16 1309 16     Temp 05/29/16 1309 98.7 F (37.1 C)     Temp Source 05/29/16 1309 Oral     SpO2 05/29/16 1309 96 %     Weight 05/29/16 1310 240 lb (108.9 kg)     Height 05/29/16 1310 5\' 9"  (1.753 m)     Head Circumference --      Peak Flow --      Pain Score 05/29/16 1309 10     Pain Loc --      Pain Edu? --      Excl. in [place]? --     Constitutional: Alert and oriented. She makes good eye contact. Eyes: Palpebral and bulbar conjunctiva are nonerythematous bilaterally. PERRL. EOMI. No scleral icterus  bilaterally. Head: Patient has maxillary and frontal sinus tenderness. ENT:      Ears: Tympanic membranes are pearly bilaterally without effusion, erythema or purulent exudate. Bony landmarks are visualized bilaterally.       Nose: Skin overlying nares is edematous without erythema. Nasal turbinates are erythematous. Nasal septum is midline.      Mouth/Throat: Mucous membranes are moist. Posterior pharynx is nonerythematous. No tonsillar exudate, hypertrophy or petechiae visualized. Uvula is midline. Neck: Full range of motion. No pain with neck flexion. Hematological/Lymphatic/Immunilogical: No cervical lymphadenopathy.  Cardiovascular: Normal rate, regular rhythm. Normal S1 and S2. No murmurs, gallops or rubs  auscultated.  Respiratory: Trachea is midline. No retractions or presence of deformity. On auscultation, adventitious sounds are absent.  Skin:  Skin is warm, dry and intact. No rash noted.  Psychiatric: Mood and affect are normal for age. Speech and behavior are normal.  ____________________________________________   LABS (all labs ordered are listed, but only abnormal results are displayed)  Labs Reviewed - No data to display ____________________________________________  EKG   ____________________________________________  RADIOLOGY   No results found.  ____________________________________________    PROCEDURES  Procedure(s) performed:    Procedures    Medications - No data to display   ____________________________________________   INITIAL IMPRESSION / ASSESSMENT AND PLAN / ED COURSE  Pertinent labs & imaging results that were available during my care of the patient were reviewed by me and considered in my medical decision making (see chart for details).  Review of the [place] CSRS was performed in accordance of the [place] prior to dispensing any controlled drugs.     Assessment and Plan:  Sinusitis:  Patient presents to the emergency department with  maxillary and frontal sinus tenderness, purulent rhinorrhea and fatigue. Patient has a history of upper respiratory tract infection that has occurred for 2 weeks. I suspect postviral sinusitis. Patient was discharged with Augmentin. Because patient has some symptoms associated with influenza including congestion, rhinorrhea and malaise, I have also discharged patient with Tamiflu. Vital signs are reassuring aside from hypertension. Patient was advised to follow-up with her primary care provider in one week. All patient questions were answered. ____________________________________________  FINAL CLINICAL IMPRESSION(S) / ED DIAGNOSES  Final diagnoses:  Acute non-recurrent maxillary sinusitis      NEW MEDICATIONS STARTED DURING THIS VISIT:  Discharge Medication List as of 05/29/2016  2:38 PM    START taking these medications   Details  amoxicillin (AMOXIL) 500 MG capsule Take 2 capsules (1,000 mg total) by mouth 3 (three) times daily., Starting Wed 05/29/2016, Print    oseltamivir (TAMIFLU) 75 MG capsule Take 1 capsule (75 mg total) by mouth 2 (two) times daily., Starting Wed 05/29/2016, Until Mon 06/03/2016, Print            This chart was dictated using voice recognition software/Dragon. Despite best efforts to proofread, errors can occur which can change the meaning. Any change was purely unintentional.    Lannie Fields, PA-C 05/30/16 1111    Earleen Newport, MD 05/30/16 [phone]

## 2016-06-20 ENCOUNTER — Encounter: Payer: Self-pay | Admitting: Family Medicine

## 2016-08-12 ENCOUNTER — Telehealth: Payer: Self-pay | Admitting: Family Medicine

## 2016-08-12 NOTE — Telephone Encounter (Signed)
Called Pt to schedule AWV with [facility] - knb °

## 2016-08-20 ENCOUNTER — Encounter: Payer: Self-pay | Admitting: Family Medicine

## 2016-08-20 ENCOUNTER — Ambulatory Visit (INDEPENDENT_AMBULATORY_CARE_PROVIDER_SITE_OTHER): Payer: Medicare Other | Admitting: Family Medicine

## 2016-08-20 VITALS — BP 114/68 | HR 82 | Temp 98.3°F | Wt 244.8 lb

## 2016-08-20 DIAGNOSIS — G8929 Other chronic pain: Secondary | ICD-10-CM

## 2016-08-20 DIAGNOSIS — R350 Frequency of micturition: Secondary | ICD-10-CM | POA: Diagnosis not present

## 2016-08-20 DIAGNOSIS — N3090 Cystitis, unspecified without hematuria: Secondary | ICD-10-CM

## 2016-08-20 DIAGNOSIS — M545 Low back pain: Secondary | ICD-10-CM

## 2016-08-20 LAB — POCT URINALYSIS DIPSTICK
Bilirubin, UA: NEGATIVE
Glucose, UA: NEGATIVE
Ketones, UA: NEGATIVE
Nitrite, UA: POSITIVE
Protein, UA: NEGATIVE
Spec Grav, UA: 1.02 (ref 1.030–1.035)
Urobilinogen, UA: 0.2 (ref ?–2.0)
pH, UA: 6 (ref 5.0–8.0)

## 2016-08-20 MED ORDER — SULFAMETHOXAZOLE-TRIMETHOPRIM 800-160 MG PO TABS: 1.0000 | ORAL_TABLET | Freq: Two times a day (BID) | ORAL | 0 refills | Status: DC

## 2016-08-20 NOTE — Patient Instructions (Signed)

## 2016-08-20 NOTE — Progress Notes (Signed)
Patient: Emily Stokes Female    DOB: Nov 14, 1960   56 y.o.   MRN: [id number] Visit Date: 08/20/2016  Today's Provider: [name], PA   Chief Complaint  Patient presents with  . Back Pain  . Urinary Frequency   Subjective:    Back Pain  This is a chronic problem. The problem occurs constantly. The problem has been gradually worsening since onset. The pain is present in the lumbar spine. The quality of the pain is described as aching. The pain radiates to the left thigh. The pain is at a severity of 8/10. The pain is worse during the night. Exacerbated by: lifting leg. Associated symptoms include bladder incontinence and pelvic pain. (Urinary frequency ) Risk factors include obesity. She has tried NSAIDs for the symptoms. The treatment provided mild relief.   Past Medical History:  Diagnosis Date  . Abdominal pain, epigastric   . Anemia   . Backache   . Bronchitis   . Calculus, kidney   . Carpal tunnel syndrome   . Chest pain   . Chicken pox   . Circulatory disease   . Disorder of kidney and ureter   . Excess, menstruation   . Hypercholesteremia   . Hypertension, essential, benign   . Infected postoperative seroma   . Iron deficiency   . Malaise and fatigue   . Measles   . Nausea alone   . Nonspecific abnormal electrocardiogram (ECG) (EKG)   . Pain in joint, lower leg    Past Surgical History:  Procedure Laterality Date  . CESAREAN SECTION    . COLONOSCOPY  1997  . HERNIA REPAIR    . LITHOTRIPSY  5427   with complications, hospitalized due to these complications for 3 months  . PARTIAL HYSTERECTOMY  2009    vaginal hysterectomy, has both ovaries   Family History  Problem Relation Age of Onset  . Hypertension Father   . Stroke Father   . Diabetes Mother   . Hypertension Mother   . Diabetes Other     sibling  . Diabetes Other     sibling  . Diabetes Other     sibling  . Hypertension Other     sibling  . Hypertension Other     sibling  . Hypertension  Other     sibling   No Known Allergies   Previous Medications   AMLODIPINE (NORVASC) 10 MG TABLET    TAKE 1 TABLET (10 MG TOTAL) BY MOUTH DAILY.   ASPIRIN 81 MG TABLET    Take 81 mg by mouth daily.   GABAPENTIN (NEURONTIN) 300 MG CAPSULE    Take by mouth. Reported on 11/27/2015   IBUPROFEN (ADVIL,MOTRIN) 200 MG TABLET    Take 200 mg by mouth every 6 (six) hours as needed (patient takes 600 mg at night.). Reported on 09/14/2015   LIDOCAINE (LIDODERM) 5 %    APPLY 1 PATCH 12 HOURS ON AND 12 HOURS OFF   METOPROLOL TARTRATE (LOPRESSOR) 25 MG TABLET    TAKE 1 TABLET (25 MG TOTAL) BY MOUTH 2 (TWO) TIMES DAILY.   TAPENTADOL (NUCYNTA) 50 MG TABS TABLET    Take 50 mg by mouth every 6 (six) hours as needed. Reported on 11/27/2015    Review of Systems  Constitutional: Negative.   Respiratory: Negative.   Cardiovascular: Negative.   Genitourinary: Positive for bladder incontinence and pelvic pain.  Musculoskeletal: Positive for back pain.    Social History  Substance Use Topics  . Smoking  status: Never Smoker  . Smokeless tobacco: Never Used  . Alcohol use No   Objective:   BP 114/68 (BP Location: Right Arm, Patient Position: Sitting, Cuff Size: Normal)   Pulse 82   Temp 98.3 F (36.8 C) (Oral)   Wt 244 lb 12.8 oz (111 kg)   SpO2 95%   BMI 36.15 kg/m   Physical Exam  Constitutional: She is oriented to person, place, and time. She appears well-developed and well-nourished. No distress.  HENT:  Head: Normocephalic and atraumatic.  Right Ear: Hearing normal.  Left Ear: Hearing normal.  Nose: Nose normal.  Eyes: Conjunctivae and lids are normal. Right eye exhibits no discharge. Left eye exhibits no discharge. No scleral icterus.  Cardiovascular: Normal rate and regular rhythm.   Pulmonary/Chest: Effort normal and breath sounds normal. No respiratory distress.  Abdominal: Soft. Bowel sounds are normal. There is tenderness.  Suprapubic discomfort to palpate. Questionable CVA tenderness to  percussion posteriorly with chronic back pain (followed by Dr. Primus Bravo).  Musculoskeletal: She exhibits tenderness.  Lower lumbar and sacral area to palpation or test ROM.  Neurological: She is alert and oriented to person, place, and time.  Skin: Skin is intact. No lesion and no rash noted.  Psychiatric: She has a normal mood and affect. Her speech is normal and behavior is normal. Thought content normal.      Assessment & Plan:     1. Cystitis Onset the past few days with frequency and lower abdominal pressure discomfort. No fever. Urinalysis showed positive nitrites and TNTC WBC's on microscopic exam. Treat with Bactrim-DS and AZO-Standard while awaiting C&S report. Recheck pending culture report. - sulfamethoxazole-trimethoprim (BACTRIM DS,SEPTRA DS) 800-160 MG tablet; Take 1 tablet by mouth 2 (two) times daily.  Dispense: 20 tablet; Refill: 0  2. Urinary frequency Onset over the few days with bladder pressure discomfort. Urinalysis show pyuria. Increase fluid intake and may use AZO-Standard. - POCT Urinalysis Dipstick - CULTURE, URINE COMPREHENSIVE  3. Chronic low back pain, unspecified back pain laterality, with sciatica presence unspecified History of chronic low back pain followed by Dr. Primus Bravo. Using some NSAID's and needs a handicap placard. Some recent flare of low back discomfort. May use OTC Salonpas Patch with Lidocaine and follow up with Dr. Primus Bravo (pain management). - POCT Urinalysis Dipstick - CULTURE, URINE COMPREHENSIVE

## 2016-08-22 LAB — CULTURE, URINE COMPREHENSIVE

## 2016-08-22 LAB — PLEASE NOTE

## 2016-08-23 ENCOUNTER — Other Ambulatory Visit: Payer: Self-pay | Admitting: Physician Assistant

## 2016-08-23 ENCOUNTER — Telehealth: Payer: Self-pay | Admitting: Family Medicine

## 2016-08-23 DIAGNOSIS — I10 Essential (primary) hypertension: Secondary | ICD-10-CM

## 2016-08-23 NOTE — Progress Notes (Signed)
Advised  ED 

## 2016-08-23 NOTE — Telephone Encounter (Signed)
Called Pt to schedule AWV with [facility] - knb °

## 2016-08-28 ENCOUNTER — Other Ambulatory Visit: Payer: Self-pay | Admitting: Family Medicine

## 2016-08-28 DIAGNOSIS — Z1231 Encounter for screening mammogram for malignant neoplasm of breast: Secondary | ICD-10-CM

## 2016-09-14 ENCOUNTER — Other Ambulatory Visit: Payer: Self-pay | Admitting: Physician Assistant

## 2016-09-14 DIAGNOSIS — I10 Essential (primary) hypertension: Secondary | ICD-10-CM

## 2016-09-23 ENCOUNTER — Telehealth: Payer: Self-pay | Admitting: Family Medicine

## 2016-09-23 NOTE — Telephone Encounter (Signed)
Have no notes on heel pain problems. Has she been seen by a podiatrist or had follow up with pain management? They may be able to get the note quicker than we can since we have not evaluated this situation. May use crutches or a cane to help with walking, which would allow her to walk without causing pressure or pain to the heel.

## 2016-09-23 NOTE — Telephone Encounter (Signed)
Please review. Thanks!  

## 2016-09-23 NOTE — Telephone Encounter (Signed)
Pt is requesting a note to be excused from jury duty due to heal reason.  Pt states she is having trouble walking.  Pt states she will need this today.  Pt states she started jury duty today and she is not able to continue.  LP#[phone]

## 2016-09-23 NOTE — Telephone Encounter (Signed)
Advised patient as below.  

## 2016-09-26 ENCOUNTER — Telehealth: Payer: Self-pay | Admitting: Family Medicine

## 2016-09-26 NOTE — Telephone Encounter (Signed)
Patient contacted to schedule AWV

## 2016-10-24 ENCOUNTER — Ambulatory Visit (INDEPENDENT_AMBULATORY_CARE_PROVIDER_SITE_OTHER): Payer: Medicare Other

## 2016-10-24 VITALS — BP 114/72 | HR 80 | Temp 98.7°F | Ht 69.0 in | Wt 239.4 lb

## 2016-10-24 DIAGNOSIS — Z114 Encounter for screening for human immunodeficiency virus [HIV]: Secondary | ICD-10-CM | POA: Diagnosis not present

## 2016-10-24 DIAGNOSIS — Z0000 Encounter for general adult medical examination without abnormal findings: Secondary | ICD-10-CM

## 2016-10-24 DIAGNOSIS — Z1211 Encounter for screening for malignant neoplasm of colon: Secondary | ICD-10-CM | POA: Diagnosis not present

## 2016-10-24 DIAGNOSIS — Z1159 Encounter for screening for other viral diseases: Secondary | ICD-10-CM | POA: Diagnosis not present

## 2016-10-24 NOTE — Progress Notes (Signed)
Subjective:   Emily Stokes is a 56 y.o. female who presents for an Initial Medicare Annual Wellness Visit.  Review of Systems    N/A   Cardiac Risk Factors include: hypertension;dyslipidemia;obesity (BMI >30kg/m2)     Objective:    Today's Vitals   10/24/16 1439  BP: 114/72  Pulse: 80  Temp: 98.7 F (37.1 C)  TempSrc: Oral  Weight: 239 lb 6.4 oz (108.6 kg)  Height: 5\' 9"  (1.753 m)  PainSc: 0-No pain   Body mass index is 35.35 kg/m.   Current Medications (verified) Outpatient Encounter Prescriptions as of 10/24/2016  Medication Sig  . amLODipine (NORVASC) 10 MG tablet TAKE 1 TABLET (10 MG TOTAL) BY MOUTH DAILY.  Marland Kitchen aspirin 81 MG tablet Take 81 mg by mouth daily.  Marland Kitchen gabapentin (NEURONTIN) 300 MG capsule Take 300 mg by mouth 3 (three) times daily. Reported on 11/27/2015  . ibuprofen (ADVIL,MOTRIN) 200 MG tablet Take 200 mg by mouth every 6 (six) hours as needed (patient takes 600 mg at night.). Reported on 09/14/2015  . lidocaine (LIDODERM) 5 % APPLY 1 PATCH 12 HOURS ON AND 12 HOURS OFF  . metoprolol tartrate (LOPRESSOR) 25 MG tablet TAKE 1 TABLET (25 MG TOTAL) BY MOUTH 2 (TWO) TIMES DAILY.  . tapentadol (NUCYNTA) 50 MG TABS tablet Take 50 mg by mouth every 6 (six) hours as needed. Reported on 11/27/2015  . [DISCONTINUED] sulfamethoxazole-trimethoprim (BACTRIM DS,SEPTRA DS) 800-160 MG tablet Take 1 tablet by mouth 2 (two) times daily.   Facility-Administered Encounter Medications as of 10/24/2016  Medication  . bupivacaine (PF) (MARCAINE) 0.25 % injection 30 mL  . ceFAZolin (ANCEF) IVPB 1 g/50 mL premix  . lactated ringers infusion 1,000 mL  . lactated ringers infusion 1,000 mL  . lactated ringers infusion 1,000 mL  . lactated ringers infusion 1,000 mL  . lidocaine (PF) (XYLOCAINE) 1 % injection 10 mL  . sodium chloride flush (NS) 0.9 % injection 20 mL  . triamcinolone acetonide (KENALOG-40) injection 40 mg    Allergies (verified) Patient has no known allergies.    History: Past Medical History:  Diagnosis Date  . Abdominal pain, epigastric   . Anemia   . Backache   . Bronchitis   . Calculus, kidney   . Carpal tunnel syndrome   . Chest pain   . Chicken pox   . Circulatory disease   . Disorder of kidney and ureter   . Excess, menstruation   . Hypercholesteremia   . Hypertension, essential, benign   . Infected postoperative seroma   . Iron deficiency   . Malaise and fatigue   . Measles   . MRSA (methicillin resistant staph aureus) culture positive   . Nausea alone   . Nonspecific abnormal electrocardiogram (ECG) (EKG)   . Pain in joint, lower leg    Past Surgical History:  Procedure Laterality Date  . CESAREAN SECTION    . COLONOSCOPY  1997  . HERNIA REPAIR    . LITHOTRIPSY  9357   with complications, hospitalized due to these complications for 3 months  . mrsa     removal on nose  . PARTIAL HYSTERECTOMY  2009    vaginal hysterectomy, has both ovaries   Family History  Problem Relation Age of Onset  . Hypertension Father   . Stroke Father   . Diabetes Mother   . Hypertension Mother   . Diabetes Other        sibling  . Diabetes Other  sibling  . Diabetes Other        sibling  . Hypertension Other        sibling  . Hypertension Other        sibling  . Hypertension Other        sibling   Social History   Occupational History  . Teacher assistant    Social History Main Topics  . Smoking status: Never Smoker  . Smokeless tobacco: Never Used  . Alcohol use No  . Drug use: No  . Sexual activity: No    Tobacco Counseling Counseling given: Not Answered   Activities of Daily Living In your present state of health, do you have any difficulty performing the following activities: 10/24/2016  Hearing? N  Vision? N  Difficulty concentrating or making decisions? N  Walking or climbing stairs? Y  Dressing or bathing? N  Doing errands, shopping? N  Preparing Food and eating ? N  Using the Toilet? N  In the  past six months, have you accidently leaked urine? Y  Do you have problems with loss of bowel control? N  Managing your Medications? N  Managing your Finances? N  Housekeeping or managing your Housekeeping? N  Some recent data might be hidden    Immunizations and Health Maintenance Immunization History  Administered Date(s) Administered  . Influenza Split 05/26/2007  . Influenza,inj,Quad PF,36+ Mos 05/31/2016   Health Maintenance Due  Topic Date Due  . PAP SMEAR  11/23/1980    Patient Care Team: Chrismon, [name], PA as PCP - General (Physician Assistant) Mohammed Kindle, MD as Referring Physician (Pain Medicine)  Indicate any recent Medical Services you may have received from other than [facility] providers in the past year (date may be approximate).     Assessment:   This is a routine wellness examination for [name].   Hearing/Vision screen Vision Screening Comments: Pt does not follow up with an eye doctor. Last vision check was > 2 years ago.  Dietary issues and exercise activities discussed: Current Exercise Habits: The patient does not participate in regular exercise at present, Exercise limited by: orthopedic condition(s)  Goals    . Exercise 2x per week (45 min per time)          Recommend increasing exercise weekly. Pt to start physical therapy 2 times a week for 45 minutes.       Depression Screen PHQ 2/9 Scores 10/24/2016 01/08/2016 11/27/2015 09/25/2015 09/14/2015  PHQ - 2 Score 0 0 0 0 0    Fall Risk Fall Risk  10/24/2016 01/22/2016 01/08/2016 11/27/2015 11/06/2015  Falls in the past year? No No No No No    Cognitive Function:     6CIT Screen 10/24/2016  What Year? 0 points  What month? 0 points  What time? 0 points  Count back from 20 0 points  Months in reverse 0 points  Repeat phrase 6 points  Total Score 6    Screening Tests Health Maintenance  Topic Date Due  . PAP SMEAR  11/23/1980  . COLONOSCOPY  04/12/2017 (Originally 11/23/2009)  . MAMMOGRAM   10/11/2017 (Originally 11/23/2009)  . TETANUS/TDAP  10/11/2017 (Originally 11/24/1978)  . INFLUENZA VACCINE  12/11/2016  . Hepatitis C Screening  Completed  . HIV Screening  Completed      Plan:  I have personally reviewed and addressed the Medicare Annual Wellness questionnaire and have noted the following in the patient's chart:  A. Medical and social history B. Use of alcohol, tobacco or  illicit drugs  C. Current medications and supplements D. Functional ability and status E.  Nutritional status F.  Physical activity G. Advance directives H. List of other physicians I.  Hospitalizations, surgeries, and ER visits in previous 12 months J.  [place] such as hearing and vision if needed, cognitive and depression L. Referrals and appointments - none  In addition, I have reviewed and discussed with patient certain preventive protocols, quality metrics, and best practice recommendations. A written personalized care plan for preventive services as well as general preventive health recommendations were provided to patient.  See attached scanned questionnaire for additional information.   Signed,  Fabio Neighbors, LPN Nurse Health Advisor   MD Recommendations: Pt needs a pap smear at next OV with PCP. Pt declined tetanus vaccine today. Pt to set up mammogram within the next year. Referral for colonoscopy sent today.    Reviewed Nurse Health Advisor's documentation and plan. Agree with note and will schedule follow up.

## 2016-10-24 NOTE — Patient Instructions (Signed)
Emily Stokes , Thank you for taking time to come for your Medicare Wellness Visit. I appreciate your ongoing commitment to your health goals. Please review the following plan we discussed and let me know if I can assist you in the future.   Screening recommendations/referrals: Colonoscopy: referral sent today Mammogram: pt to set up within the next year Bone Density: N/A Recommended yearly ophthalmology/optometry visit for glaucoma screening and checkup Recommended yearly dental visit for hygiene and checkup  Vaccinations: Influenza vaccine: up to date, due 01/2017 Pneumococcal vaccine: N/A Tdap vaccine: declined Shingles vaccine: N/A  Advanced directives: Advance directive discussed with you today. Even though you declined this today please call our office should you change your mind and we can give you the proper paperwork for you to fill out.  Conditions/risks identified: Recommend increasing exercise weekly. Pt to start physical therapy 2 times a week for 45 minutes.   Next appointment: None, need to schedule follow up with PCP and 1 year AWV .   Preventive Care 57 Years and Older, Female Preventive care refers to lifestyle choices and visits with your health care provider that can promote health and wellness. What does preventive care include?  A yearly physical exam. This is also called an annual well check.  Dental exams once or twice a year.  Routine eye exams. Ask your health care provider how often you should have your eyes checked.  Personal lifestyle choices, including:  Daily care of your teeth and gums.  Regular physical activity.  Eating a healthy diet.  Avoiding tobacco and drug use.  Limiting alcohol use.  Practicing safe sex.  Taking low-dose aspirin every day.  Taking vitamin and mineral supplements as recommended by your health care provider. What happens during an annual well check? The services and screenings done by your health care provider  during your annual well check will depend on your age, overall health, lifestyle risk factors, and family history of disease. Counseling  Your health care provider may ask you questions about your:  Alcohol use.  Tobacco use.  Drug use.  Emotional well-being.  Home and relationship well-being.  Sexual activity.  Eating habits.  History of falls.  Memory and ability to understand (cognition).  Work and work Statistician.  Reproductive health. Screening  You may have the following tests or measurements:  Height, weight, and BMI.  Blood pressure.  Lipid and cholesterol levels. These may be checked every 5 years, or more frequently if you are over 16 years old.  Skin check.  Lung cancer screening. You may have this screening every year starting at age 39 if you have a 30-pack-year history of smoking and currently smoke or have quit within the past 15 years.  Fecal occult blood test (FOBT) of the stool. You may have this test every year starting at age 34.  Flexible sigmoidoscopy or colonoscopy. You may have a sigmoidoscopy every 5 years or a colonoscopy every 10 years starting at age 75.  Hepatitis C blood test.  Hepatitis B blood test.  Sexually transmitted disease (STD) testing.  Diabetes screening. This is done by checking your blood sugar (glucose) after you have not eaten for a while (fasting). You may have this done every 1-3 years.  Bone density scan. This is done to screen for osteoporosis. You may have this done starting at age 30.  Mammogram. This may be done every 1-2 years. Talk to your health care provider about how often you should have regular mammograms. Talk with your  health care provider about your test results, treatment options, and if necessary, the need for more tests. Vaccines  Your health care provider may recommend certain vaccines, such as:  Influenza vaccine. This is recommended every year.  Tetanus, diphtheria, and acellular pertussis  (Tdap, Td) vaccine. You may need a Td booster every 10 years.  Zoster vaccine. You may need this after age 18.  Pneumococcal 13-valent conjugate (PCV13) vaccine. One dose is recommended after age 9.  Pneumococcal polysaccharide (PPSV23) vaccine. One dose is recommended after age 70. Talk to your health care provider about which screenings and vaccines you need and how often you need them. This information is not intended to replace advice given to you by your health care provider. Make sure you discuss any questions you have with your health care provider. Document Released: 05/26/2015 Document Revised: 01/17/2016 Document Reviewed: 02/28/2015 Elsevier Interactive Patient Education  2017 [employer] Prevention in the Home Falls can cause injuries. They can happen to people of all ages. There are many things you can do to make your home safe and to help prevent falls. What can I do on the outside of my home?  Regularly fix the edges of walkways and driveways and fix any cracks.  Remove anything that might make you trip as you walk through a door, such as a raised step or threshold.  Trim any bushes or trees on the path to your home.  Use bright outdoor lighting.  Clear any walking paths of anything that might make someone trip, such as rocks or tools.  Regularly check to see if handrails are loose or broken. Make sure that both sides of any steps have handrails.  Any raised decks and porches should have guardrails on the edges.  Have any leaves, snow, or ice cleared regularly.  Use sand or salt on walking paths during winter.  Clean up any spills in your garage right away. This includes oil or grease spills. What can I do in the bathroom?  Use night lights.  Install grab bars by the toilet and in the tub and shower. Do not use towel bars as grab bars.  Use non-skid mats or decals in the tub or shower.  If you need to sit down in the shower, use a plastic, non-slip  stool.  Keep the floor dry. Clean up any water that spills on the floor as soon as it happens.  Remove soap buildup in the tub or shower regularly.  Attach bath mats securely with double-sided non-slip rug tape.  Do not have throw rugs and other things on the floor that can make you trip. What can I do in the bedroom?  Use night lights.  Make sure that you have a light by your bed that is easy to reach.  Do not use any sheets or blankets that are too big for your bed. They should not hang down onto the floor.  Have a firm chair that has side arms. You can use this for support while you get dressed.  Do not have throw rugs and other things on the floor that can make you trip. What can I do in the kitchen?  Clean up any spills right away.  Avoid walking on wet floors.  Keep items that you use a lot in easy-to-reach places.  If you need to reach something above you, use a strong step stool that has a grab bar.  Keep electrical cords out of the way.  Do not use  floor polish or wax that makes floors slippery. If you must use wax, use non-skid floor wax.  Do not have throw rugs and other things on the floor that can make you trip. What can I do with my stairs?  Do not leave any items on the stairs.  Make sure that there are handrails on both sides of the stairs and use them. Fix handrails that are broken or loose. Make sure that handrails are as long as the stairways.  Check any carpeting to make sure that it is firmly attached to the stairs. Fix any carpet that is loose or worn.  Avoid having throw rugs at the top or bottom of the stairs. If you do have throw rugs, attach them to the floor with carpet tape.  Make sure that you have a light switch at the top of the stairs and the bottom of the stairs. If you do not have them, ask someone to add them for you. What else can I do to help prevent falls?  Wear shoes that:  Do not have high heels.  Have rubber bottoms.  Are  comfortable and fit you well.  Are closed at the toe. Do not wear sandals.  If you use a stepladder:  Make sure that it is fully opened. Do not climb a closed stepladder.  Make sure that both sides of the stepladder are locked into place.  Ask someone to hold it for you, if possible.  Clearly mark and make sure that you can see:  Any grab bars or handrails.  First and last steps.  Where the edge of each step is.  Use tools that help you move around (mobility aids) if they are needed. These include:  Canes.  Walkers.  Scooters.  Crutches.  Turn on the lights when you go into a dark area. Replace any light bulbs as soon as they burn out.  Set up your furniture so you have a clear path. Avoid moving your furniture around.  If any of your floors are uneven, fix them.  If there are any pets around you, be aware of where they are.  Review your medicines with your doctor. Some medicines can make you feel dizzy. This can increase your chance of falling. Ask your doctor what other things that you can do to help prevent falls. This information is not intended to replace advice given to you by your health care provider. Make sure you discuss any questions you have with your health care provider. Document Released: 02/23/2009 Document Revised: 10/05/2015 Document Reviewed: 06/03/2014 Elsevier Interactive Patient Education  2017 [employer].

## 2016-10-25 LAB — HIV ANTIBODY (ROUTINE TESTING W REFLEX): HIV Screen 4th Generation wRfx: NONREACTIVE

## 2016-10-25 LAB — HEPATITIS C ANTIBODY: Hep C Virus Ab: <0.1 s/co ratio (ref 0.0–0.9)

## 2016-11-04 ENCOUNTER — Encounter: Payer: Medicare Other | Admitting: Family Medicine

## 2016-11-21 ENCOUNTER — Telehealth: Payer: Self-pay

## 2016-11-21 ENCOUNTER — Other Ambulatory Visit: Payer: Self-pay

## 2016-11-21 DIAGNOSIS — Z1211 Encounter for screening for malignant neoplasm of colon: Secondary | ICD-10-CM

## 2016-11-21 NOTE — Telephone Encounter (Signed)
Gastroenterology Pre-Procedure Review  Request Date: 01/09/17 Requesting Physician: Dr. Vicente Males  PATIENT REVIEW QUESTIONS: The patient responded to the following health history questions as indicated:    1. Are you having any GI issues? no 2. Do you have a personal history of Polyps? no 3. Do you have a family history of Colon Cancer or Polyps? no 4. Diabetes Mellitus? no 5. Joint replacements in the past 12 months?no 6. Major health problems in the past 3 months?no 7. Any artificial heart valves, MVP, or defibrillator?no    MEDICATIONS & ALLERGIES:    Patient reports the following regarding taking any anticoagulation/antiplatelet therapy:   Plavix, Coumadin, Eliquis, Xarelto, Lovenox, Pradaxa, Brilinta, or Effient? no Aspirin? no  Patient confirms/reports the following medications:  Current Outpatient Prescriptions  Medication Sig Dispense Refill  . amLODipine (NORVASC) 10 MG tablet TAKE 1 TABLET (10 MG TOTAL) BY MOUTH DAILY. 30 tablet 3  . aspirin 81 MG tablet Take 81 mg by mouth daily.    Marland Kitchen gabapentin (NEURONTIN) 300 MG capsule Take 300 mg by mouth 3 (three) times daily. Reported on 11/27/2015    . ibuprofen (ADVIL,MOTRIN) 200 MG tablet Take 200 mg by mouth every 6 (six) hours as needed (patient takes 600 mg at night.). Reported on 09/14/2015    . lidocaine (LIDODERM) 5 % APPLY 1 PATCH 12 HOURS ON AND 12 HOURS OFF 30 patch 0  . metoprolol tartrate (LOPRESSOR) 25 MG tablet TAKE 1 TABLET (25 MG TOTAL) BY MOUTH 2 (TWO) TIMES DAILY. 60 tablet 6  . tapentadol (NUCYNTA) 50 MG TABS tablet Take 50 mg by mouth every 6 (six) hours as needed. Reported on 11/27/2015     Current Facility-Administered Medications  Medication Dose Route Frequency Provider Last Rate Last Dose  . bupivacaine (PF) (MARCAINE) 0.25 % injection 30 mL  30 mL Other Once Mohammed Kindle, MD      . ceFAZolin (ANCEF) IVPB 1 g/50 mL premix  1 g Intravenous Once Mohammed Kindle, MD      . lactated ringers infusion 1,000 mL  1,000  mL Intravenous Continuous Mohammed Kindle, MD      . lactated ringers infusion 1,000 mL  1,000 mL Intravenous Continuous Mohammed Kindle, MD 125 mL/hr at 11/06/15 1200 1,000 mL at 11/06/15 1200  . lactated ringers infusion 1,000 mL  1,000 mL Intravenous Continuous Mohammed Kindle, MD 125 mL/hr at 12/04/15 1243 1,000 mL at 12/04/15 1243  . lactated ringers infusion 1,000 mL  1,000 mL Intravenous Continuous Mohammed Kindle, MD      . lidocaine (PF) (XYLOCAINE) 1 % injection 10 mL  10 mL Subcutaneous Once Mohammed Kindle, MD      . sodium chloride flush (NS) 0.9 % injection 20 mL  20 mL Other Once Mohammed Kindle, MD      . triamcinolone acetonide (KENALOG-40) injection 40 mg  40 mg Other Once Mohammed Kindle, MD        Patient confirms/reports the following allergies:  No Known Allergies  No orders of the defined types were placed in this encounter.   AUTHORIZATION INFORMATION Primary Insurance: 1D#: Group #:  Secondary Insurance: 1D#: Group #:  SCHEDULE INFORMATION: Date: 01/09/17 Time: Location:

## 2016-12-05 DIAGNOSIS — J310 Chronic rhinitis: Secondary | ICD-10-CM | POA: Diagnosis not present

## 2016-12-05 DIAGNOSIS — J340 Abscess, furuncle and carbuncle of nose: Secondary | ICD-10-CM | POA: Diagnosis not present

## 2016-12-27 ENCOUNTER — Other Ambulatory Visit: Payer: Self-pay

## 2016-12-27 DIAGNOSIS — Z1231 Encounter for screening mammogram for malignant neoplasm of breast: Secondary | ICD-10-CM

## 2017-01-08 ENCOUNTER — Telehealth: Payer: Self-pay

## 2017-01-08 NOTE — Telephone Encounter (Signed)
Changed procedure date and mailed prep information to address instead of MyChart.   Patient does not check her account.

## 2017-01-30 ENCOUNTER — Encounter: Payer: Self-pay | Admitting: Anesthesiology

## 2017-01-30 SURGERY — COLONOSCOPY WITH PROPOFOL
Anesthesia: General

## 2017-01-31 ENCOUNTER — Ambulatory Visit: Admission: RE | Admit: 2017-01-31 | Payer: Medicare Other | Source: Ambulatory Visit | Admitting: Gastroenterology

## 2017-04-25 ENCOUNTER — Other Ambulatory Visit: Payer: Self-pay | Admitting: Family Medicine

## 2017-04-25 DIAGNOSIS — I10 Essential (primary) hypertension: Secondary | ICD-10-CM

## 2017-05-18 DIAGNOSIS — I10 Essential (primary) hypertension: Secondary | ICD-10-CM | POA: Diagnosis not present

## 2017-05-18 DIAGNOSIS — M25552 Pain in left hip: Secondary | ICD-10-CM | POA: Diagnosis not present

## 2017-05-18 DIAGNOSIS — R202 Paresthesia of skin: Secondary | ICD-10-CM | POA: Diagnosis not present

## 2017-05-18 DIAGNOSIS — M1612 Unilateral primary osteoarthritis, left hip: Secondary | ICD-10-CM | POA: Diagnosis not present

## 2017-05-18 DIAGNOSIS — R262 Difficulty in walking, not elsewhere classified: Secondary | ICD-10-CM | POA: Diagnosis not present

## 2017-05-18 DIAGNOSIS — S79912A Unspecified injury of left hip, initial encounter: Secondary | ICD-10-CM | POA: Diagnosis not present

## 2017-05-18 DIAGNOSIS — M79605 Pain in left leg: Secondary | ICD-10-CM | POA: Diagnosis not present

## 2017-05-19 ENCOUNTER — Ambulatory Visit (INDEPENDENT_AMBULATORY_CARE_PROVIDER_SITE_OTHER): Payer: Medicare Other | Admitting: Family Medicine

## 2017-05-19 ENCOUNTER — Encounter: Payer: Self-pay | Admitting: Family Medicine

## 2017-05-19 VITALS — BP 124/78 | HR 86 | Temp 98.3°F | Wt 239.6 lb

## 2017-05-19 DIAGNOSIS — M47816 Spondylosis without myelopathy or radiculopathy, lumbar region: Secondary | ICD-10-CM | POA: Diagnosis not present

## 2017-05-19 DIAGNOSIS — I10 Essential (primary) hypertension: Secondary | ICD-10-CM | POA: Diagnosis not present

## 2017-05-19 DIAGNOSIS — Z23 Encounter for immunization: Secondary | ICD-10-CM

## 2017-05-19 DIAGNOSIS — M545 Low back pain: Secondary | ICD-10-CM

## 2017-05-19 DIAGNOSIS — N3001 Acute cystitis with hematuria: Secondary | ICD-10-CM

## 2017-05-19 LAB — POCT URINALYSIS DIPSTICK
Bilirubin, UA: NEGATIVE
Glucose, UA: NEGATIVE
Ketones, UA: NEGATIVE
Nitrite, UA: POSITIVE
Spec Grav, UA: >=1.03 — AB (ref 1.010–1.025)
Urobilinogen, UA: 0.2 E.U./dL
pH, UA: 6 (ref 5.0–8.0)

## 2017-05-19 MED ORDER — LIDOCAINE 5 % EX PTCH: MEDICATED_PATCH | CUTANEOUS | 0 refills | Status: DC

## 2017-05-19 MED ORDER — METOPROLOL TARTRATE 25 MG PO TABS: 25.0000 mg | ORAL_TABLET | Freq: Two times a day (BID) | ORAL | 3 refills | Status: DC

## 2017-05-19 MED ORDER — SULFAMETHOXAZOLE-TRIMETHOPRIM 800-160 MG PO TABS: 1.0000 | ORAL_TABLET | Freq: Two times a day (BID) | ORAL | 0 refills | Status: DC

## 2017-05-19 NOTE — Progress Notes (Signed)
Patient: Emily Stokes Female    DOB: 05-13-1961   57 y.o.   MRN: [id number] Visit Date: 05/19/2017  Today's Provider: [name], PA   Chief Complaint  Patient presents with  . Back Pain  . Leg Pain   Subjective:    Back Pain  This is a chronic problem. Episode onset: this episode started a few weeks ago. The problem has been gradually worsening since onset. The pain is present in the lumbar spine. The quality of the pain is described as aching. The pain radiates to the left thigh. The symptoms are aggravated by bending, position, lying down, standing and sitting. Associated symptoms include leg pain and numbness (left leg). She has tried NSAIDs (Tylenol) for the symptoms.  Patient was seen at ER yesterday for leg pain and a fall. They prescribed Naproxen.   Past Medical History:  Diagnosis Date  . Abdominal pain, epigastric   . Anemia   . Backache   . Bronchitis   . Calculus, kidney   . Carpal tunnel syndrome   . Chest pain   . Chicken pox   . Circulatory disease   . Disorder of kidney and ureter   . Excess, menstruation   . Hypercholesteremia   . Hypertension, essential, benign   . Infected postoperative seroma   . Iron deficiency   . Malaise and fatigue   . Measles   . MRSA (methicillin resistant staph aureus) culture positive   . Nausea alone   . Nonspecific abnormal electrocardiogram (ECG) (EKG)   . Pain in joint, lower leg    Past Surgical History:  Procedure Laterality Date  . CESAREAN SECTION    . COLONOSCOPY  1997  . HERNIA REPAIR    . LITHOTRIPSY  9323   with complications, hospitalized due to these complications for 3 months  . mrsa     removal on nose  . PARTIAL HYSTERECTOMY  2009    vaginal hysterectomy, has both ovaries   Family History  Problem Relation Age of Onset  . Hypertension Father   . Stroke Father   . Diabetes Mother   . Hypertension Mother   . Diabetes Other        sibling  . Diabetes Other        sibling  . Diabetes  Other        sibling  . Hypertension Other        sibling  . Hypertension Other        sibling  . Hypertension Other        sibling   No Known Allergies  Current Outpatient Medications:  .  amLODipine (NORVASC) 10 MG tablet, TAKE 1 TABLET BY MOUTH EVERY DAY, Disp: 30 tablet, Rfl: 3 .  aspirin 81 MG tablet, Take 81 mg by mouth daily., Disp: , Rfl:  .  gabapentin (NEURONTIN) 300 MG capsule, Take 300 mg by mouth 3 (three) times daily. Reported on 11/27/2015, Disp: , Rfl:  .  ibuprofen (ADVIL,MOTRIN) 200 MG tablet, Take 200 mg by mouth every 6 (six) hours as needed (patient takes 600 mg at night.). Reported on 09/14/2015, Disp: , Rfl:  .  lidocaine (LIDODERM) 5 %, APPLY 1 PATCH 12 HOURS ON AND 12 HOURS OFF, Disp: 30 patch, Rfl: 0 .  metoprolol tartrate (LOPRESSOR) 25 MG tablet, TAKE 1 TABLET (25 MG TOTAL) BY MOUTH 2 (TWO) TIMES DAILY., Disp: 60 tablet, Rfl: 6 .  tapentadol (NUCYNTA) 50 MG TABS tablet, Take 50 mg by  mouth every 6 (six) hours as needed. Reported on 11/27/2015, Disp: , Rfl:   Current Facility-Administered Medications:  .  bupivacaine (PF) (MARCAINE) 0.25 % injection 30 mL, 30 mL, Other, Once, Mohammed Kindle, MD .  ceFAZolin (ANCEF) IVPB 1 g/50 mL premix, 1 g, Intravenous, Once, Mohammed Kindle, MD .  lactated ringers infusion 1,000 mL, 1,000 mL, Intravenous, Continuous, Mohammed Kindle, MD .  lactated ringers infusion 1,000 mL, 1,000 mL, Intravenous, Continuous, Mohammed Kindle, MD, Last Rate: 125 mL/hr at 11/06/15 1200, 1,000 mL at 11/06/15 1200 .  lactated ringers infusion 1,000 mL, 1,000 mL, Intravenous, Continuous, Mohammed Kindle, MD, Last Rate: 125 mL/hr at 12/04/15 1243, 1,000 mL at 12/04/15 1243 .  lactated ringers infusion 1,000 mL, 1,000 mL, Intravenous, Continuous, Mohammed Kindle, MD .  lidocaine (PF) (XYLOCAINE) 1 % injection 10 mL, 10 mL, Subcutaneous, Once, Mohammed Kindle, MD .  sodium chloride flush (NS) 0.9 % injection 20 mL, 20 mL, Other, Once, Mohammed Kindle, MD .   triamcinolone acetonide (KENALOG-40) injection 40 mg, 40 mg, Other, Once, Mohammed Kindle, MD  Review of Systems  Constitutional: Negative.   Respiratory: Negative.   Cardiovascular: Negative.   Musculoskeletal: Positive for back pain.       Left leg pain   Neurological: Positive for numbness (left leg).   Social History   Tobacco Use  . Smoking status: Never Smoker  . Smokeless tobacco: Never Used  Substance Use Topics  . Alcohol use: No    Alcohol/week: 0.0 oz   Objective:   BP 124/78 (BP Location: Right Arm, Patient Position: Sitting, Cuff Size: Normal)   Pulse 86   Temp 98.3 F (36.8 C) (Oral)   Wt 239 lb 9.6 oz (108.7 kg)   SpO2 96%   BMI 35.38 kg/m    Physical Exam  Constitutional: She is oriented to person, place, and time. She appears well-developed and well-nourished. No distress.  HENT:  Head: Normocephalic and atraumatic.  Right Ear: Hearing normal.  Left Ear: Hearing normal.  Nose: Nose normal.  Eyes: Conjunctivae and lids are normal. Right eye exhibits no discharge. Left eye exhibits no discharge. No scleral icterus.  Neck: Neck supple.  Cardiovascular: Normal rate and regular rhythm.  Pulmonary/Chest: Effort normal. No respiratory distress.  Abdominal: Soft. Bowel sounds are normal.  Musculoskeletal:  Low back ache with history of chronic pain into the left thigh. No tenderness to palpate lower back or test CVA tenderness by percussion. Increased pain in the right hip and back to test hip ROM or SLR's to 80 degrees on the left.   Neurological: She is alert and oriented to person, place, and time.  Skin: Skin is intact. No lesion and no rash noted.  Psychiatric: She has a normal mood and affect. Her speech is normal and behavior is normal. Thought content normal.      Assessment & Plan:     1. Acute low back pain, unspecified back pain laterality, with sciatica presence unspecified Fell on 05-18-17 when the chair she had been sitting in was pulled away  and she sat down hard. This was evaluated at the [facility] ED and x-rays showed only severe left hip osteoarthrosis. Still having low back and upper thigh discomfort. Has been seen by Dr. Primus Bravo (pain management) in the past but ran out of the Lidocaine patches. Has had thigh/abdominal pain after hysterectomy 0-2-72 with complications of right ureter transection and colon perforation in April of 2010. Abdominal surgeon and the [facility] felt some of her  pain was due to nerve damage following multiple abdominal surgeries. Will treat UTI and refill Lidocaine patches. May need follow up with Dr. Primus Bravo. - POCT Urinalysis Dipstick - lidocaine (LIDODERM) 5 %; APPLY 1 PATCH 12 HOURS ON AND 12 HOURS OFF  Dispense: 30 patch; Refill: 0  2. Acute cystitis with hematuria Feels some of the back pain is the same as past UTI's. Urinalysis was positive for nitrites and leukocytes. Will treat with Bactrim-DS BID #14 and get urine culture. Drink extra fluids and may use AZO-Standard prn dysuria and frequency. Recheck pending C&S report. - Urine Culture  3. Facet syndrome, lumbar Multilevel degenerative facet disease on MRI of 07-13-15. Chronic back pain since complication of hysterectomy, also. Refill Lidocaine for back pain and continue NSAID. - lidocaine (LIDODERM) 5 %; APPLY 1 PATCH 12 HOURS ON AND 12 HOURS OFF  Dispense: 30 patch; Refill: 0  4. Essential hypertension BP well controlled today. Tolerating Metoprolol without palpitations or chest pains. Refill Metoprolol. Recheck prn. - metoprolol tartrate (LOPRESSOR) 25 MG tablet; Take 1 tablet (25 mg total) by mouth 2 (two) times daily.  Dispense: 180 tablet; Refill: 3  5. Need for influenza vaccination - Flu Vaccine QUAD 6+ mos PF IM (Fluarix Quad PF)       [name], PA  [facility]

## 2017-05-20 ENCOUNTER — Telehealth: Payer: Self-pay

## 2017-05-20 NOTE — Telephone Encounter (Signed)
Left patient a VM to return call. PA for Lidocaine patches was denied. Per Emily Stokes she can try OTC Salonpas Lidocaine patches for back pain.

## 2017-05-21 LAB — URINE CULTURE

## 2017-05-22 NOTE — Telephone Encounter (Signed)
-----   Message from [place], [place] sent at 05/22/2017  3:40 PM EST ----- Urine culture isolated E.coli bacteria causing the UTI. It is sensitive to the Trimethoprim/Sulfamethoxazole (Bactrim-DS) given. Finish all the antibiotic and drink extra water to flush out system. Recheck if any symptoms remain after finishing the antibiotic.

## 2017-05-22 NOTE — Telephone Encounter (Signed)
LMTCB on mobile number. Home number there was no answer and voicemail is full

## 2017-05-23 NOTE — Telephone Encounter (Signed)
Letter mailed to address in chart advising patient that we have attempted to contact her regarding PA denial and alternate treatment plan suggestions. If she still needed treatment to contact us.

## 2017-05-23 NOTE — Telephone Encounter (Signed)
lmtcb

## 2017-06-28 DIAGNOSIS — R918 Other nonspecific abnormal finding of lung field: Secondary | ICD-10-CM | POA: Diagnosis not present

## 2017-06-28 DIAGNOSIS — Z7982 Long term (current) use of aspirin: Secondary | ICD-10-CM | POA: Diagnosis not present

## 2017-06-28 DIAGNOSIS — M545 Low back pain: Secondary | ICD-10-CM | POA: Diagnosis not present

## 2017-06-28 DIAGNOSIS — M47817 Spondylosis without myelopathy or radiculopathy, lumbosacral region: Secondary | ICD-10-CM | POA: Diagnosis not present

## 2017-06-28 DIAGNOSIS — Z791 Long term (current) use of non-steroidal anti-inflammatories (NSAID): Secondary | ICD-10-CM | POA: Diagnosis not present

## 2017-06-28 DIAGNOSIS — M1612 Unilateral primary osteoarthritis, left hip: Secondary | ICD-10-CM | POA: Diagnosis not present

## 2017-06-28 DIAGNOSIS — Z792 Long term (current) use of antibiotics: Secondary | ICD-10-CM | POA: Diagnosis not present

## 2017-06-28 DIAGNOSIS — Z79899 Other long term (current) drug therapy: Secondary | ICD-10-CM | POA: Diagnosis not present

## 2017-06-28 DIAGNOSIS — R1084 Generalized abdominal pain: Secondary | ICD-10-CM | POA: Diagnosis not present

## 2017-06-28 DIAGNOSIS — Z980 Intestinal bypass and anastomosis status: Secondary | ICD-10-CM | POA: Diagnosis not present

## 2017-06-28 DIAGNOSIS — M79605 Pain in left leg: Secondary | ICD-10-CM | POA: Diagnosis not present

## 2017-06-28 DIAGNOSIS — R109 Unspecified abdominal pain: Secondary | ICD-10-CM | POA: Diagnosis not present

## 2017-06-28 DIAGNOSIS — K439 Ventral hernia without obstruction or gangrene: Secondary | ICD-10-CM | POA: Diagnosis not present

## 2017-06-28 DIAGNOSIS — I10 Essential (primary) hypertension: Secondary | ICD-10-CM | POA: Diagnosis not present

## 2017-06-28 DIAGNOSIS — N281 Cyst of kidney, acquired: Secondary | ICD-10-CM | POA: Diagnosis not present

## 2017-06-28 DIAGNOSIS — R1032 Left lower quadrant pain: Secondary | ICD-10-CM | POA: Diagnosis not present

## 2017-06-28 DIAGNOSIS — R590 Localized enlarged lymph nodes: Secondary | ICD-10-CM | POA: Diagnosis not present

## 2017-07-04 ENCOUNTER — Ambulatory Visit (INDEPENDENT_AMBULATORY_CARE_PROVIDER_SITE_OTHER): Payer: Medicare Other | Admitting: Family Medicine

## 2017-07-04 ENCOUNTER — Encounter: Payer: Self-pay | Admitting: Family Medicine

## 2017-07-04 VITALS — BP 120/68 | HR 78 | Temp 98.4°F | Wt 251.8 lb

## 2017-07-04 DIAGNOSIS — G894 Chronic pain syndrome: Secondary | ICD-10-CM

## 2017-07-04 DIAGNOSIS — R1032 Left lower quadrant pain: Secondary | ICD-10-CM

## 2017-07-04 DIAGNOSIS — M1612 Unilateral primary osteoarthritis, left hip: Secondary | ICD-10-CM | POA: Diagnosis not present

## 2017-07-04 DIAGNOSIS — R631 Polydipsia: Secondary | ICD-10-CM

## 2017-07-04 NOTE — Progress Notes (Signed)
Patient: Emily Stokes Female    DOB: 1960/08/02   57 y.o.   MRN: [id number] Visit Date: 07/04/2017  Today's Provider: [name], PA   Chief Complaint  Patient presents with  . Request a referral   Subjective:    HPI Patient is requesting a surgeon referral for ventral hernia. She states [facility] ER recommended the Referrals. She was seen there on 06/28/17 for abdominal pain, and leg/hip pain. She is also requesting a new referral to pain management for left hip pain. She was seeing Dr. Primus Bravo at [facility] before he left. She states she attempted to contact him at his new practice without success.    Past Medical History:  Diagnosis Date  . Abdominal pain, epigastric   . Anemia   . Backache   . Bronchitis   . Calculus, kidney   . Carpal tunnel syndrome   . Chest pain   . Chicken pox   . Circulatory disease   . Disorder of kidney and ureter   . Excess, menstruation   . Hypercholesteremia   . Hypertension, essential, benign   . Infected postoperative seroma   . Iron deficiency   . Malaise and fatigue   . Measles   . MRSA (methicillin resistant staph aureus) culture positive   . Nausea alone   . Nonspecific abnormal electrocardiogram (ECG) (EKG)   . Pain in joint, lower leg    Past Surgical History:  Procedure Laterality Date  . CESAREAN SECTION    . COLONOSCOPY  1997  . HERNIA REPAIR    . LITHOTRIPSY  6546   with complications, hospitalized due to these complications for 3 months  . mrsa     removal on nose  . PARTIAL HYSTERECTOMY  2009    vaginal hysterectomy, has both ovaries   Family History  Problem Relation Age of Onset  . Hypertension Father   . Stroke Father   . Diabetes Mother   . Hypertension Mother   . Diabetes Other        sibling  . Diabetes Other        sibling  . Diabetes Other        sibling  . Hypertension Other        sibling  . Hypertension Other        sibling  . Hypertension Other        sibling   No Known  Allergies  Current Outpatient Medications:  .  amLODipine (NORVASC) 10 MG tablet, TAKE 1 TABLET BY MOUTH EVERY DAY, Disp: 30 tablet, Rfl: 3 .  aspirin 81 MG tablet, Take 81 mg by mouth daily., Disp: , Rfl:  .  gabapentin (NEURONTIN) 300 MG capsule, Take 300 mg by mouth 3 (three) times daily. Reported on 11/27/2015, Disp: , Rfl:  .  ibuprofen (ADVIL,MOTRIN) 200 MG tablet, Take 200 mg by mouth every 6 (six) hours as needed (patient takes 600 mg at night.). Reported on 09/14/2015, Disp: , Rfl:  .  lidocaine (LIDODERM) 5 %, APPLY 1 PATCH 12 HOURS ON AND 12 HOURS OFF, Disp: 30 patch, Rfl: 0 .  metoprolol tartrate (LOPRESSOR) 25 MG tablet, Take 1 tablet (25 mg total) by mouth 2 (two) times daily., Disp: 180 tablet, Rfl: 3 .  sulfamethoxazole-trimethoprim (BACTRIM DS,SEPTRA DS) 800-160 MG tablet, Take 1 tablet by mouth 2 (two) times daily., Disp: 14 tablet, Rfl: 0 .  tapentadol (NUCYNTA) 50 MG TABS tablet, Take 50 mg by mouth every 6 (six) hours  as needed. Reported on 11/27/2015, Disp: , Rfl:   Current Facility-Administered Medications:  .  bupivacaine (PF) (MARCAINE) 0.25 % injection 30 mL, 30 mL, Other, Once, Mohammed Kindle, MD .  ceFAZolin (ANCEF) IVPB 1 g/50 mL premix, 1 g, Intravenous, Once, Mohammed Kindle, MD .  lactated ringers infusion 1,000 mL, 1,000 mL, Intravenous, Continuous, Mohammed Kindle, MD .  lactated ringers infusion 1,000 mL, 1,000 mL, Intravenous, Continuous, Mohammed Kindle, MD, Last Rate: 125 mL/hr at 11/06/15 1200, 1,000 mL at 11/06/15 1200 .  lactated ringers infusion 1,000 mL, 1,000 mL, Intravenous, Continuous, Mohammed Kindle, MD, Last Rate: 125 mL/hr at 12/04/15 1243, 1,000 mL at 12/04/15 1243 .  lactated ringers infusion 1,000 mL, 1,000 mL, Intravenous, Continuous, Mohammed Kindle, MD .  lidocaine (PF) (XYLOCAINE) 1 % injection 10 mL, 10 mL, Subcutaneous, Once, Mohammed Kindle, MD .  sodium chloride flush (NS) 0.9 % injection 20 mL, 20 mL, Other, Once, Mohammed Kindle, MD .   triamcinolone acetonide (KENALOG-40) injection 40 mg, 40 mg, Other, Once, Mohammed Kindle, MD  Review of Systems  Constitutional: Negative.   Gastrointestinal: Positive for abdominal pain.  Musculoskeletal:       Leg and hip pain    Social History   Tobacco Use  . Smoking status: Never Smoker  . Smokeless tobacco: Never Used  Substance Use Topics  . Alcohol use: No    Alcohol/week: 0.0 oz   Objective:   BP 120/68 (BP Location: Right Arm, Patient Position: Sitting, Cuff Size: Normal)   Pulse 78   Temp 98.4 F (36.9 C) (Oral)   Wt 251 lb 12.8 oz (114.2 kg)   SpO2 96%   BMI 37.18 kg/m   Physical Exam  Constitutional: She is oriented to person, place, and time. She appears well-developed and well-nourished. No distress.  HENT:  Head: Normocephalic and atraumatic.  Right Ear: Hearing normal.  Left Ear: Hearing normal.  Nose: Nose normal.  Eyes: Conjunctivae and lids are normal. Right eye exhibits no discharge. Left eye exhibits no discharge. No scleral icterus.  Pulmonary/Chest: Effort normal. No respiratory distress.  Abdominal: Soft.  Some tenderness in lower abdomen and slight swelling. Easily reduces when supine.  Musculoskeletal:  Pain in the left hip when attempting to flex or rotate. Unable to get the left ankle up to the right knee due to pain and stiffness.  Neurological: She is alert and oriented to person, place, and time.  Skin: Skin is intact. No lesion and no rash noted.  Psychiatric: She has a normal mood and affect. Her speech is normal and behavior is normal. Thought content normal.      Assessment & Plan:     1. Abdominal pain, LLQ (left lower quadrant) Has had chronic left abdominal pain since hysterectomy in 2009. Felt there was some increase in discomfort and swelling 2-3 weeks ago. Went to the [facility] ER and exam showed an abdominal hernia in the suprapubic region that is easily reducible and soft. UA and blood tests were essentially normal except  elevation of glucose. CT scan of abdomen and pelvis was negative for kidney stones or masses. No sign of diverticulitis. Severe left hip osteoarthritis and degenerative changes at L5-S1 were noted. Also, confirmed multiple ventral wal hernias without incarceration. Will schedule general surgery referral and get follow up labs. - Ambulatory referral to General Surgery - Comprehensive metabolic panel - CBC with Differential/Platelet  2. Chronic pain syndrome History of degenerative disease in lumbar spine causing chronic pain on the left and history of  pain developing after hysterectomy in 2009. Was followed by Dr. Primus Bravo but he is no longer available and patient would like a referral to Dr. Dossie Arbour, if available. - Ambulatory referral to Pain Clinic  3. Primary osteoarthritis of left hip Past x-rays and CT of abdomen (in [facility] ER on 06-28-17) shows severe and progressive osteoarthritis of the left hip. Recommend orthopedic referral to evaluate for need of joint injection or replacement. - Ambulatory referral to Orthopedic Surgery  4. Polydipsia Blood sugar was 196 in the ER on 06-28-17. Admits to an increase in thirst and drinking extra water. Family history positive for diabetes. Will the check CMP, CBC and Hgb A1C to rule out diabetes. - Hemoglobin A1c - Comprehensive metabolic panel - CBC with Differential/Platelet       [name], PA  [facility]

## 2017-07-07 ENCOUNTER — Other Ambulatory Visit: Payer: Self-pay

## 2017-07-07 ENCOUNTER — Encounter: Payer: Self-pay | Admitting: General Surgery

## 2017-07-07 ENCOUNTER — Emergency Department (HOSPITAL_COMMUNITY)
Admission: EM | Admit: 2017-07-07 | Discharge: 2017-07-07 | Disposition: A | Discharge status: Home / Self Care | Payer: Medicare Other | Attending: Emergency Medicine | Admitting: Emergency Medicine

## 2017-07-07 ENCOUNTER — Encounter (HOSPITAL_COMMUNITY): Payer: Self-pay

## 2017-07-07 DIAGNOSIS — R42 Dizziness and giddiness: Secondary | ICD-10-CM | POA: Diagnosis not present

## 2017-07-07 DIAGNOSIS — Z79899 Other long term (current) drug therapy: Secondary | ICD-10-CM | POA: Diagnosis not present

## 2017-07-07 DIAGNOSIS — N12 Tubulo-interstitial nephritis, not specified as acute or chronic: Secondary | ICD-10-CM | POA: Diagnosis not present

## 2017-07-07 DIAGNOSIS — I10 Essential (primary) hypertension: Secondary | ICD-10-CM | POA: Diagnosis not present

## 2017-07-07 DIAGNOSIS — R531 Weakness: Secondary | ICD-10-CM | POA: Diagnosis present

## 2017-07-07 DIAGNOSIS — R404 Transient alteration of awareness: Secondary | ICD-10-CM | POA: Diagnosis not present

## 2017-07-07 DIAGNOSIS — Z7982 Long term (current) use of aspirin: Secondary | ICD-10-CM | POA: Diagnosis not present

## 2017-07-07 LAB — CBC WITH DIFFERENTIAL/PLATELET
Basophils Absolute: 0 10*3/uL (ref 0.0–0.1)
Basophils Relative: 0 %
Eosinophils Absolute: 0 10*3/uL (ref 0.0–0.7)
Eosinophils Relative: 0 %
HCT: 38.6 % (ref 36.0–46.0)
Hemoglobin: 12.8 g/dL (ref 12.0–15.0)
Lymphocytes Relative: 9 %
Lymphs Abs: 1.1 10*3/uL (ref 0.7–4.0)
MCH: 32.1 pg (ref 26.0–34.0)
MCHC: 33.2 g/dL (ref 30.0–36.0)
MCV: 96.7 fL (ref 78.0–100.0)
Monocytes Absolute: 0.8 10*3/uL (ref 0.1–1.0)
Monocytes Relative: 6 %
Neutro Abs: 11 10*3/uL — ABNORMAL HIGH (ref 1.7–7.7)
Neutrophils Relative %: 85 %
Platelets: 274 10*3/uL (ref 150–400)
RBC: 3.99 MIL/uL (ref 3.87–5.11)
RDW: 13.6 % (ref 11.5–15.5)
WBC: 13 10*3/uL — ABNORMAL HIGH (ref 4.0–10.5)

## 2017-07-07 LAB — URINALYSIS, ROUTINE W REFLEX MICROSCOPIC
Bilirubin Urine: NEGATIVE
Glucose, UA: 50 mg/dL — AB
Ketones, ur: NEGATIVE mg/dL
Nitrite: POSITIVE — AB
Protein, ur: NEGATIVE mg/dL
Specific Gravity, Urine: 1.009 (ref 1.005–1.030)
pH: 5 (ref 5.0–8.0)

## 2017-07-07 LAB — COMPREHENSIVE METABOLIC PANEL
ALT: 24 U/L (ref 14–54)
AST: 29 U/L (ref 15–41)
Albumin: 3.7 g/dL (ref 3.5–5.0)
Alkaline Phosphatase: 125 U/L (ref 38–126)
Anion gap: 10 (ref 5–15)
BUN: 14 mg/dL (ref 6–20)
CO2: 22 mmol/L (ref 22–32)
Calcium: 9.3 mg/dL (ref 8.9–10.3)
Chloride: 105 mmol/L (ref 101–111)
Creatinine, Ser: 1.09 mg/dL — ABNORMAL HIGH (ref 0.44–1.00)
GFR calc Af Amer: >60 mL/min (ref >60)
GFR calc non Af Amer: 55 mL/min — ABNORMAL LOW (ref >60)
Glucose, Bld: 167 mg/dL — ABNORMAL HIGH (ref 65–99)
Potassium: 4 mmol/L (ref 3.5–5.1)
Sodium: 137 mmol/L (ref 135–145)
Total Bilirubin: 1.1 mg/dL (ref 0.3–1.2)
Total Protein: 7.8 g/dL (ref 6.5–8.1)

## 2017-07-07 LAB — LIPASE, BLOOD: Lipase: 26 U/L (ref 11–51)

## 2017-07-07 MED ORDER — ONDANSETRON HCL 4 MG/2ML IJ SOLN: 4.0000 mg | Freq: Once | INTRAMUSCULAR | Status: DC

## 2017-07-07 MED ORDER — CEPHALEXIN 500 MG PO CAPS: 500.0000 mg | ORAL_CAPSULE | Freq: Four times a day (QID) | ORAL | 0 refills | Status: AC

## 2017-07-07 MED ORDER — SODIUM CHLORIDE 0.9 % IV SOLN
INTRAVENOUS | Status: DC
  Administered 2017-07-07: 18:00:00 via INTRAVENOUS

## 2017-07-07 MED ORDER — MORPHINE SULFATE (PF) 4 MG/ML IV SOLN
4.0000 mg | Freq: Once | INTRAVENOUS | Status: AC
  Administered 2017-07-07: 4 mg via INTRAVENOUS
  Filled 2017-07-07: qty 1

## 2017-07-07 MED ORDER — SODIUM CHLORIDE 0.9 % IV BOLUS (SEPSIS)
1000.0000 mL | Freq: Once | INTRAVENOUS | Status: AC
  Administered 2017-07-07: 1000 mL via INTRAVENOUS

## 2017-07-07 MED ORDER — ONDANSETRON 8 MG PO TBDP: 8.0000 mg | ORAL_TABLET | Freq: Three times a day (TID) | ORAL | 0 refills | Status: DC | PRN

## 2017-07-07 MED ORDER — SODIUM CHLORIDE 0.9 % IV SOLN
1.0000 g | Freq: Once | INTRAVENOUS | Status: AC
  Administered 2017-07-07: 1 g via INTRAVENOUS
  Filled 2017-07-07: qty 10

## 2017-07-07 MED ORDER — ACETAMINOPHEN 325 MG PO TABS
650.0000 mg | ORAL_TABLET | Freq: Once | ORAL | Status: AC
  Administered 2017-07-07: 650 mg via ORAL
  Filled 2017-07-07: qty 2

## 2017-07-07 NOTE — Discharge Instructions (Signed)
Drink plenty of fluids, take Tylenol or ibuprofen as needed for fevers and aches, follow-up with your doctor next week to make sure the infection is resolving.  Return to the emergency room for worsening symptoms, vomiting, weakness

## 2017-07-07 NOTE — ED Provider Notes (Signed)
[facility] EMERGENCY DEPARTMENT Provider Note   CSN: [id number] Arrival date & time: 07/07/17  1554     History   Chief Complaint Chief Complaint  Patient presents with  . Weakness    HPI Emily Stokes is a 57 y.o. female.  HPI Patient states she started having trouble with weakness and abdominal pain issues about a week and a half ago.  She initially went to another hospital last Saturday on February 16 and was evaluated.  Patient states she had laboratory tests and a CT scan.  Medical records are available from that visit.  CT scan demonstrated multiple ventral abdominal wall hernias and stable retroperitoneal lymphadenopathy..  She was diagnosed with a hernia and her primary doctor referred her to general surgery.  Patient states since that time her symptoms have progressed.  She has had urinary frequency.  She continues to have discomfort in her lower abdomen.  He has had nausea without vomiting.  She denies any diarrhea.  She is having fevers now up to 103.  She denies any trouble with cough or sore throat. Past Medical History:  Diagnosis Date  . Abdominal pain, epigastric   . Anemia   . Backache   . Bronchitis   . Calculus, kidney   . Carpal tunnel syndrome   . Chest pain   . Chicken pox   . Circulatory disease   . Disorder of kidney and ureter   . Excess, menstruation   . Hypercholesteremia   . Hypertension, essential, benign   . Infected postoperative seroma   . Iron deficiency   . Malaise and fatigue   . Measles   . MRSA (methicillin resistant staph aureus) culture positive   . Nausea alone   . Nonspecific abnormal electrocardiogram (ECG) (EKG)   . Pain in joint, lower leg     Patient Active Problem List   Diagnosis Date Noted  . Cellulitis of face 05/30/2016  . Chronic pain syndrome 05/30/2016  . Nasal septal abscess 05/30/2016  . Pain in elbow 02/05/2016  . DDD (degenerative disc disease), lumbar 09/14/2015  . Facet syndrome, lumbar  09/14/2015  . Lumbar radiculopathy 09/14/2015  . Sacroiliac joint dysfunction 09/14/2015  . History of chicken pox 02/02/2015  . Leg pain 02/02/2015  . Hypercholesteremia 02/02/2015  . Blood glucose elevated 02/02/2015  . Pain in soft tissues of limb 02/02/2015  . Nonspecific ST-T changes 02/02/2015  . Awareness of heartbeats 02/02/2015  . Pain in extremity at multiple sites 02/02/2015  . Snores 02/02/2015  . Hernia of anterior abdominal wall 02/24/2012  . Pain in shoulder 12/05/2011  . Adnexal mass 10/30/2011  . Pelvic cyst 10/30/2011  . Palpitations 03/14/2011  . HTN (hypertension) 03/14/2011  . Abnormal finding on EKG 03/14/2011  . Obesity 03/14/2011  . Infected surgical wound 09/15/2008  . Urinary system disease 08/10/2008  . Benign essential HTN 07/12/2008  . Arthralgia of lower leg 03/28/2008  . Anemia, iron deficiency 06/17/2007  . Calculus of kidney 05/26/2007    Past Surgical History:  Procedure Laterality Date  . CESAREAN SECTION    . COLONOSCOPY  1997  . HERNIA REPAIR    . LITHOTRIPSY  7322   with complications, hospitalized due to these complications for 3 months  . mrsa     removal on nose  . PARTIAL HYSTERECTOMY  2009    vaginal hysterectomy, has both ovaries    OB History    Gravida Para Term Preterm AB Living   5  1     SAB TAB Ectopic Multiple Live Births   1               Home Medications    Prior to Admission medications   Medication Sig Start Date End Date Taking? Authorizing Provider  amLODipine (NORVASC) 10 MG tablet TAKE 1 TABLET BY MOUTH EVERY DAY 04/25/17   Chrismon, [name], PA  aspirin 81 MG tablet Take 81 mg by mouth daily.    [provider]  cephALEXin (KEFLEX) 500 MG capsule Take 1 capsule (500 mg total) by mouth 4 (four) times daily for 10 days. 07/07/17 07/17/17  Dorie Rank, MD  gabapentin (NEURONTIN) 300 MG capsule Take 300 mg by mouth 3 (three) times daily. Reported on 11/27/2015    [provider]    ibuprofen (ADVIL,MOTRIN) 200 MG tablet Take 200 mg by mouth every 6 (six) hours as needed (patient takes 600 mg at night.). Reported on 09/14/2015    [provider]  lidocaine (LIDODERM) 5 % APPLY 1 PATCH 12 HOURS ON AND 12 HOURS OFF 05/19/17   Chrismon, [name], PA  metoprolol tartrate (LOPRESSOR) 25 MG tablet Take 1 tablet (25 mg total) by mouth 2 (two) times daily. 05/19/17   Chrismon, [name], PA  ondansetron (ZOFRAN ODT) 8 MG disintegrating tablet Take 1 tablet (8 mg total) by mouth every 8 (eight) hours as needed for nausea or vomiting. 07/07/17   Dorie Rank, MD  sulfamethoxazole-trimethoprim (BACTRIM DS,SEPTRA DS) 800-160 MG tablet Take 1 tablet by mouth 2 (two) times daily. 05/19/17   Chrismon, [name], PA  tapentadol (NUCYNTA) 50 MG TABS tablet Take 50 mg by mouth every 6 (six) hours as needed. Reported on 11/27/2015    [provider]    Family History Family History  Problem Relation Age of Onset  . Hypertension Father   . Stroke Father   . Diabetes Mother   . Hypertension Mother   . Diabetes Other        sibling  . Diabetes Other        sibling  . Diabetes Other        sibling  . Hypertension Other        sibling  . Hypertension Other        sibling  . Hypertension Other        sibling    Social History Social History   Tobacco Use  . Smoking status: Never Smoker  . Smokeless tobacco: Never Used  Substance Use Topics  . Alcohol use: No    Alcohol/week: 0.0 oz  . Drug use: No     Allergies   Patient has no known allergies.   Review of Systems Review of Systems  All other systems reviewed and are negative.    Physical Exam Updated Vital Signs Temp (!) 101 F (38.3 C) (Oral)   Ht 1.753 m (5\' 9" )   Wt 106.6 kg (235 lb)   BMI 34.70 kg/m   Physical Exam  Constitutional: She appears well-developed and well-nourished. No distress.  HENT:  Head: Normocephalic and atraumatic.  Right Ear: External ear normal.  Left Ear: External ear  normal.  Eyes: Conjunctivae are normal. Right eye exhibits no discharge. Left eye exhibits no discharge. No scleral icterus.  Neck: Neck supple. No tracheal deviation present.  Cardiovascular: Normal rate, regular rhythm and intact distal pulses.  Pulmonary/Chest: Effort normal and breath sounds normal. No stridor. No respiratory distress. She has no wheezes. She has no  rales.  Abdominal: Soft. Bowel sounds are normal. She exhibits no distension. There is tenderness in the suprapubic area. There is no rebound and no guarding. A hernia is present. Hernia confirmed positive in the ventral area.  Ventral hernia appreciated on exam, soft and easily reduced, no signs of incarceration  Musculoskeletal: She exhibits no edema or tenderness.  Neurological: She is alert. She has normal strength. No cranial nerve deficit (no facial droop, extraocular movements intact, no slurred speech) or sensory deficit. She exhibits normal muscle tone. She displays no seizure activity. Coordination normal.  Skin: Skin is warm and dry. No rash noted.  Psychiatric: She has a normal mood and affect.  Nursing note and vitals reviewed.    ED Treatments / Results  Labs (all labs ordered are listed, but only abnormal results are displayed) Labs Reviewed  COMPREHENSIVE METABOLIC PANEL - Abnormal; Notable for the following components:      Result Value   Glucose, Bld 167 (*)    Creatinine, Ser 1.09 (*)    GFR calc non Af Amer 55 (*)    All other components within normal limits  CBC WITH DIFFERENTIAL/PLATELET - Abnormal; Notable for the following components:   WBC 13.0 (*)    Neutro Abs 11.0 (*)    All other components within normal limits  URINALYSIS, ROUTINE W REFLEX MICROSCOPIC - Abnormal; Notable for the following components:   APPearance HAZY (*)    Glucose, UA 50 (*)    Hgb urine dipstick MODERATE (*)    Nitrite POSITIVE (*)    Leukocytes, UA SMALL (*)    Bacteria, UA FEW (*)    Squamous Epithelial / LPF 0-5  (*)    All other components within normal limits  URINE CULTURE  LIPASE, BLOOD    EKG  EKG Interpretation None       Radiology No results found.  Procedures Procedures (including critical care time)  Medications Ordered in ED Medications  sodium chloride 0.9 % bolus 1,000 mL (1,000 mLs Intravenous New Bag/Given 07/07/17 1636)    And  0.9 %  sodium chloride infusion (not administered)  cefTRIAXone (ROCEPHIN) 1 g in sodium chloride 0.9 % 100 mL IVPB (not administered)  acetaminophen (TYLENOL) tablet 650 mg (650 mg Oral Given 07/07/17 1636)  morphine 4 MG/ML injection 4 mg (4 mg Intravenous Given 07/07/17 1636)     Initial Impression / Assessment and Plan / ED Course  I have reviewed the triage vital signs and the nursing notes.  Pertinent labs & imaging results that were available during my care of the patient were reviewed by me and considered in my medical decision making (see chart for details).  Clinical Course as of Jul 07 1813  Mon Jul 07, 2017  1812 Discussed findings with patient.  Sx consistent with uti/pyelo.  Pt is feeling better with fluids.  She would like to try to go home.  [JK]    Clinical Course User Index [JK] Dorie Rank, MD    Patient's laboratory tests are notable for a slight increase in her white blood cell count but otherwise stable CBC.  Comprehensive anabolic panel is unremarkable with exception of mild increase in her glucose.  Urinalysis is consistent with a urinary tract infection.  Patient symptoms do suggest pyelonephritis.  She has had urinary symptoms associated with fever and an abnormal urine specimen.  Patient has not had any vomiting.  Home.  I will give her a dose of IV antibiotics here in the emergency room and  discharged home on oral antibiotics with plans for outpatient follow-up.  Warning signs and precautions discussed  Final Clinical Impressions(s) / ED Diagnoses   Final diagnoses:  Pyelonephritis    ED Discharge Orders         Ordered    cephALEXin (KEFLEX) 500 MG capsule  4 times daily     07/07/17 1814    ondansetron (ZOFRAN ODT) 8 MG disintegrating tablet  Every 8 hours PRN     07/07/17 1814       Dorie Rank, MD 07/07/17 1816

## 2017-07-07 NOTE — ED Triage Notes (Signed)
Pt arrived via [facility]; per EMS patient from home with c/o weakness and dizziness since Sat; pt went to [facility] and was dx with dehydration; but has gotten increasingly worse since d/c; Pt c/o nausea and was given 521mL of NS; and 4mg  of zofran; CBG 188; 154/79; 112

## 2017-07-07 NOTE — ED Notes (Signed)
Pt discharged from ED; instructions provided and scripts given; Pt encouraged to return to ED if symptoms worsen and to f/u with PCP; Pt verbalized understanding of all instructions 

## 2017-07-10 LAB — URINE CULTURE: Culture: >=100000 — AB

## 2017-07-11 ENCOUNTER — Telehealth: Payer: Self-pay

## 2017-07-11 DIAGNOSIS — R7309 Other abnormal glucose: Secondary | ICD-10-CM | POA: Diagnosis not present

## 2017-07-11 DIAGNOSIS — E119 Type 2 diabetes mellitus without complications: Secondary | ICD-10-CM | POA: Diagnosis not present

## 2017-07-11 DIAGNOSIS — R631 Polydipsia: Secondary | ICD-10-CM | POA: Diagnosis not present

## 2017-07-11 DIAGNOSIS — R1032 Left lower quadrant pain: Secondary | ICD-10-CM | POA: Diagnosis not present

## 2017-07-11 NOTE — Telephone Encounter (Signed)
Post ED Visit - Positive Culture Follow-up  Culture report reviewed by antimicrobial stewardship pharmacist:  []  Elenor Quinones, Pharm.D. []  Heide Guile, Pharm.D., BCPS AQ-ID []  Parks Neptune, Pharm.D., BCPS []  Alycia Rossetti, Pharm.D., BCPS []  [place], Pharm.D., BCPS, AAHIVP [x]  Legrand Como, Pharm.D., BCPS, AAHIVP []  Salome Arnt, PharmD, BCPS []  Jalene Mullet, PharmD []  Vincenza Hews, PharmD, BCPS  Positive urine culture Treated with Cephalexin, organism sensitive to the same and no further patient follow-up is required at this time.  Genia Del 07/11/2017, 1:42 PM

## 2017-07-12 LAB — HEMOGLOBIN A1C
Est. average glucose Bld gHb Est-mCnc: 206 mg/dL
Hgb A1c MFr Bld: 8.8 % — ABNORMAL HIGH (ref 4.8–5.6)

## 2017-07-14 ENCOUNTER — Other Ambulatory Visit: Payer: Self-pay

## 2017-07-14 DIAGNOSIS — E119 Type 2 diabetes mellitus without complications: Secondary | ICD-10-CM

## 2017-07-14 NOTE — Telephone Encounter (Signed)
-----   Message from Margo Common, [place] sent at 07/14/2017 10:02 AM EST ----- Hgb A1C confirms diabetes mellitus. Need Metformin 500 mg BID #60 & 3RF. Schedule follow up appointment in 2-3 weeks to recheck diabetes and schedule [facility] diabetic education classes.

## 2017-07-14 NOTE — Telephone Encounter (Signed)
Attempted to contact patient. No answer and unable to leave a voicemail. Phone rang until a busy signal.

## 2017-07-15 MED ORDER — METFORMIN HCL 500 MG PO TABS
500.0000 mg | ORAL_TABLET | Freq: Two times a day (BID) | ORAL | 3 refills | Status: DC
Start: 2017-07-15 — End: 2017-08-19

## 2017-07-15 NOTE — Telephone Encounter (Signed)
Patient advised. RX sent to [facility]. Referral ordered. 3 week follow up scheduled.

## 2017-07-22 ENCOUNTER — Ambulatory Visit: Payer: Self-pay | Admitting: General Surgery

## 2017-07-23 ENCOUNTER — Ambulatory Visit: Payer: Medicare Other | Admitting: General Surgery

## 2017-07-23 ENCOUNTER — Encounter: Payer: Self-pay | Admitting: General Surgery

## 2017-07-23 VITALS — BP 124/78 | HR 78 | Resp 14 | Ht 68.0 in | Wt 245.0 lb

## 2017-07-23 DIAGNOSIS — K458 Other specified abdominal hernia without obstruction or gangrene: Secondary | ICD-10-CM | POA: Diagnosis not present

## 2017-07-23 NOTE — Progress Notes (Signed)
Patient ID: Emily Stokes, female   DOB: [birth year], 57 y.o.   MRN: [id number]  Chief Complaint  Patient presents with  . Abdominal Pain    HPI Emily Stokes is a 57 y.o. female here today for a evaluation of right lower abdominal pain.  Patient states the pain comes and goes and started about a month ago. Pain with standing. The pain can run down her left leg and feels dumb. She had a ct scan on 06/28/2017. Moves her bowels daily.  HPI  Past Medical History:  Diagnosis Date  . Abdominal pain, epigastric   . Anemia   . Backache   . Bronchitis   . Calculus, kidney   . Carpal tunnel syndrome   . Chest pain   . Chicken pox   . Circulatory disease   . Disorder of kidney and ureter   . Excess, menstruation   . Hypercholesteremia   . Hypertension, essential, benign   . Infected postoperative seroma   . Iron deficiency   . Malaise and fatigue   . Measles   . MRSA (methicillin resistant staph aureus) culture positive   . Nausea alone   . Nonspecific abnormal electrocardiogram (ECG) (EKG)   . Pain in joint, lower leg     Past Surgical History:  Procedure Laterality Date  . ABDOMINAL HYSTERECTOMY    . CESAREAN SECTION    . COLONOSCOPY  1997  . COLOSTOMY TAKEDOWN  2012  . Hartmann's procedure.    Marland Kitchen HERNIA REPAIR  1093   umbilical  . LITHOTRIPSY  2355   with complications, hospitalized due to these complications for 3 months  . mrsa     removal on nose  . PARTIAL HYSTERECTOMY  2009    vaginal hysterectomy, has both ovaries    Family History  Problem Relation Age of Onset  . Hypertension Father   . Stroke Father   . Diabetes Mother   . Hypertension Mother   . Diabetes Other        sibling  . Diabetes Other        sibling  . Diabetes Other        sibling  . Hypertension Other        sibling  . Hypertension Other        sibling  . Hypertension Other        sibling    Social History Social History   Tobacco Use  . Smoking status: Never Smoker  . Smokeless  tobacco: Never Used  Substance Use Topics  . Alcohol use: No    Alcohol/week: 0.0 oz  . Drug use: No    No Known Allergies  Current Outpatient Medications  Medication Sig Dispense Refill  . amLODipine (NORVASC) 10 MG tablet TAKE 1 TABLET BY MOUTH EVERY DAY 30 tablet 3  . aspirin 81 MG tablet Take 81 mg by mouth daily.    Marland Kitchen gabapentin (NEURONTIN) 300 MG capsule Take 300 mg by mouth 3 (three) times daily. Reported on 11/27/2015    . ibuprofen (ADVIL,MOTRIN) 200 MG tablet Take 200 mg by mouth every 6 (six) hours as needed (patient takes 600 mg at night.). Reported on 09/14/2015    . lidocaine (LIDODERM) 5 % APPLY 1 PATCH 12 HOURS ON AND 12 HOURS OFF 30 patch 0  . metFORMIN (GLUCOPHAGE) 500 MG tablet Take 1 tablet (500 mg total) by mouth 2 (two) times daily with a meal. 60 tablet 3  . metoprolol tartrate (LOPRESSOR) 25 MG tablet  Take 1 tablet (25 mg total) by mouth 2 (two) times daily. 180 tablet 3  . ondansetron (ZOFRAN ODT) 8 MG disintegrating tablet Take 1 tablet (8 mg total) by mouth every 8 (eight) hours as needed for nausea or vomiting. 20 tablet 0  . sulfamethoxazole-trimethoprim (BACTRIM DS,SEPTRA DS) 800-160 MG tablet Take 1 tablet by mouth 2 (two) times daily. 14 tablet 0  . tapentadol (NUCYNTA) 50 MG TABS tablet Take 50 mg by mouth every 6 (six) hours as needed. Reported on 11/27/2015     Current Facility-Administered Medications  Medication Dose Route Frequency Provider Last Rate Last Dose  . bupivacaine (PF) (MARCAINE) 0.25 % injection 30 mL  30 mL Other Once Mohammed Kindle, MD      . ceFAZolin (ANCEF) IVPB 1 g/50 mL premix  1 g Intravenous Once Mohammed Kindle, MD      . lactated ringers infusion 1,000 mL  1,000 mL Intravenous Continuous Mohammed Kindle, MD      . lactated ringers infusion 1,000 mL  1,000 mL Intravenous Continuous Mohammed Kindle, MD 125 mL/hr at 11/06/15 1200 1,000 mL at 11/06/15 1200  . lactated ringers infusion 1,000 mL  1,000 mL Intravenous Continuous Mohammed Kindle, MD 125 mL/hr at 12/04/15 1243 1,000 mL at 12/04/15 1243  . lactated ringers infusion 1,000 mL  1,000 mL Intravenous Continuous Mohammed Kindle, MD      . lidocaine (PF) (XYLOCAINE) 1 % injection 10 mL  10 mL Subcutaneous Once Mohammed Kindle, MD      . sodium chloride flush (NS) 0.9 % injection 20 mL  20 mL Other Once Mohammed Kindle, MD      . triamcinolone acetonide (KENALOG-40) injection 40 mg  40 mg Other Once Mohammed Kindle, MD        Review of Systems Review of Systems  Constitutional: Negative.   Respiratory: Negative.   Cardiovascular: Negative.     Blood pressure 124/78, pulse 78, resp. rate 14, height 5\' 8"  (1.727 m), weight 245 lb (111.1 kg).  Physical Exam Physical Exam  Constitutional: She is oriented to person, place, and time. She appears well-developed and well-nourished.  Cardiovascular: Normal rate, regular rhythm and normal heart sounds.  Pulmonary/Chest: Effort normal and breath sounds normal.  Abdominal: Soft. Normal appearance and bowel sounds are normal. There is tenderness. A hernia is present.    Neurological: She is alert and oriented to person, place, and time.  Skin: Skin is warm and dry.    Data Reviewed EXAM: CT abdomen and pelvis with contrast DATE: 06/28/2017 6:26 PM ACCESSION: [id number] UN DICTATED: 06/28/2017 7:37 PM INTERPRETATION LOCATION: [place]  CLINICAL INDICATION: 57 years old Female with ABDOMINAL PAIN, (specify site in comments)- LLQ-    COMPARISON: CT abdomen/pelvis 08/17/2014.  TECHNIQUE: A spiral CT scan was obtained with IV contrast from the lung bases to the pubic symphysis.  Images were reconstructed in the axial plane. Coronal and sagittal reformatted images were also provided for further evaluation.  FINDINGS: LOWER CHEST: Subcentimeter left lower lobe pulmonary nodules, measuring up to 6 mm, stable since 08/17/2014 and likely benign. Mild bibasilar dependent atelectasis.  HEPATOBILIARY: Subcentimeter left hepatic  dome hypodensity, too small to characterize (2:23). No biliary ductal dilatation.  GALLBLADDER: Unremarkable. SPLEEN: Unremarkable. PANCREAS: Unremarkable. ADRENAL GLANDS: Unremarkable. KIDNEYS/URETERS: Symmetric nephrograms. No hydronephrosis or nephrolithiasis. Lobulated kidneys with bilateral cortical scarring. Stable left renal cysts additional. Ill-defined 1 cm left renal hypodensity, stable since 2016. Bilateral subcentimeter renal hypodensities, similar to prior. BLADDER: Bladder is underdistended, limiting its evaluation. REPRODUCTIVE:  Uterus is surgically absent. No suspicious adnexal masses. Surgical clips in the right hemipelvis. BOWEL/PERITONEUM: Colocolonic anastomosis in the left lower quadrant. No bowel obstruction. No acute inflammatory process. Unremarkable appendix. No ascites. Multiple ventral abdominal hernias. The largest is in the lower abdominal wall just superior to the pubic symphysis containing nonobstructed small bowel loops. An additional smaller more superior Richter-type ventral abdominal wall hernia, involving a loop of small bowel (2:75). No evidence of obstruction or strangulation. VASCULATURE: Aorta and major branch vessels are patent. LYMPH NODES: Similar-appearing subcentimeter paraesophageal nodes. 1.2 cm retroperitoneal node (2:45) and additional prominent retroperitoneal nodes, stable since 2016.  BONES:No acute osseous abnormality. Degenerative changes at L5-S1. Severe left hip osteoarthrosis. SOFT TISSUES: Ventral abdominal wall hernias, as described above.  These films were reviewed.  Laboratory studies of July 11, 2017 showed an elevated hemoglobin A1c at 8.8, CBC showing a white blood cell count of 13,000 with 80% polys, hemoglobin of 12.8, platelet count 274,000.  Comprehensive metabolic panel showing a creatinine of 1.09 with an estimated GFR greater than 60.  Normal liver function studies.  Normal electrolytes.  Urine culture, greater than 100,000  colonies of E. coli.  Sensitive to Bactrim.   Assessment    Lower abdominal pain, likely related to recent UTI.  Chronic lower abdominal discomfort likely related to long-standing hernia and fascial stretch.  Multiple fascial defects for which component separation will be required.  Poorly controlled diabetes based on hemoglobin A1c.  Obesity.  Essential hypertension, well controlled.    Plan    I recommended University assessment for the scope of surgery the patient will require, in particular due to the low nature and the need for full coverage of the anterior abdominal wall below the pubis level.  Her most recent surgical experience was at [facility], and this would be a reasonable starting place.    HPI, Physical Exam, Assessment and Plan have been scribed under the direction and in the presence of Hervey Ard, MD.  Gaspar Cola, CMA  I have completed the exam and reviewed the above documentation for accuracy and completeness.  I agree with the above.  Haematologist has been used and any errors in dictation or transcription are unintentional.  Hervey Ard, M.D., F.A.C.S.  Emily Stokes Allayna Erlich 07/24/2017, 7:23 AM

## 2017-07-23 NOTE — Patient Instructions (Signed)
Return as needed

## 2017-07-24 DIAGNOSIS — K458 Other specified abdominal hernia without obstruction or gangrene: Secondary | ICD-10-CM | POA: Insufficient documentation

## 2017-07-25 DIAGNOSIS — M1612 Unilateral primary osteoarthritis, left hip: Secondary | ICD-10-CM | POA: Diagnosis not present

## 2017-07-25 DIAGNOSIS — M167 Other unilateral secondary osteoarthritis of hip: Secondary | ICD-10-CM | POA: Diagnosis not present

## 2017-07-31 DIAGNOSIS — E119 Type 2 diabetes mellitus without complications: Secondary | ICD-10-CM | POA: Insufficient documentation

## 2017-07-31 DIAGNOSIS — E1165 Type 2 diabetes mellitus with hyperglycemia: Secondary | ICD-10-CM | POA: Insufficient documentation

## 2017-08-01 ENCOUNTER — Ambulatory Visit (INDEPENDENT_AMBULATORY_CARE_PROVIDER_SITE_OTHER): Payer: Medicare Other | Admitting: Family Medicine

## 2017-08-01 ENCOUNTER — Encounter: Payer: Self-pay | Admitting: Family Medicine

## 2017-08-01 VITALS — BP 118/60 | HR 60 | Temp 98.1°F | Resp 16 | Ht 68.0 in | Wt 241.0 lb

## 2017-08-01 DIAGNOSIS — E119 Type 2 diabetes mellitus without complications: Secondary | ICD-10-CM

## 2017-08-01 LAB — POCT GLYCOSYLATED HEMOGLOBIN (HGB A1C)
Est. average glucose Bld gHb Est-mCnc: 189
Hemoglobin A1C: 8.2

## 2017-08-01 MED ORDER — GLUCOSE BLOOD VI STRP: ORAL_STRIP | 3 refills | Status: DC

## 2017-08-01 MED ORDER — ONETOUCH ULTRASOFT LANCETS MISC: 3 refills | Status: DC

## 2017-08-01 NOTE — Progress Notes (Signed)
p      Patient: Emily Stokes Female    DOB: 10-10-1960   57 y.o.   MRN: [id number] Visit Date: 08/01/2017  Today's Provider: [name], PA   Chief Complaint  Patient presents with  . Follow-up  . Diabetes   Subjective:    HPI   Diabetes Mellitus Type II, Follow-up:   Lab Results  Component Value Date   HGBA1C 8.8 (H) 07/11/2017   Last seen for diabetes 1 months ago.  Management since then includes; Hgb A1C confirmed diabetes mellitus. Started Metformin 500 mg BID. Advised to follow up in 2-3 weeks to recheck diabetes and schedule [facility] diabetic education classes. She reports good compliance with treatment. She is not having side effects. none Current symptoms include none and have been unchanged. Home blood sugar records: not checking  Episodes of hypoglycemia? no   Current Insulin Regimen: n/a Most Recent Eye Exam: n/a Weight trend: stable Prior visit with dietician: no Current diet: in general, an "unhealthy" diet Current exercise: none  ---------------------------------------------------------------- Past Medical History:  Diagnosis Date  . Abdominal pain, epigastric   . Anemia   . Backache   . Bronchitis   . Calculus, kidney   . Carpal tunnel syndrome   . Chest pain   . Chicken pox   . Circulatory disease   . Disorder of kidney and ureter   . Excess, menstruation   . Hypercholesteremia   . Hypertension, essential, benign   . Infected postoperative seroma   . Iron deficiency   . Malaise and fatigue   . Measles   . MRSA (methicillin resistant staph aureus) culture positive   . Nausea alone   . Nonspecific abnormal electrocardiogram (ECG) (EKG)   . Pain in joint, lower leg    Past Surgical History:  Procedure Laterality Date  . ABDOMINAL HYSTERECTOMY    . CESAREAN SECTION    . COLONOSCOPY  1997  . COLOSTOMY TAKEDOWN  2012  . Hartmann's procedure.    Marland Kitchen HERNIA REPAIR  2423   umbilical  . LITHOTRIPSY  5361   with  complications, hospitalized due to these complications for 3 months  . mrsa     removal on nose  . PARTIAL HYSTERECTOMY  2009    vaginal hysterectomy, has both ovaries   Family History  Problem Relation Age of Onset  . Hypertension Father   . Stroke Father   . Diabetes Mother   . Hypertension Mother   . Diabetes Other        sibling  . Diabetes Other        sibling  . Diabetes Other        sibling  . Hypertension Other        sibling  . Hypertension Other        sibling  . Hypertension Other        sibling   No Known Allergies  Current Outpatient Medications:  .  amLODipine (NORVASC) 10 MG tablet, TAKE 1 TABLET BY MOUTH EVERY DAY, Disp: 30 tablet, Rfl: 3 .  aspirin 81 MG tablet, Take 81 mg by mouth daily., Disp: , Rfl:  .  gabapentin (NEURONTIN) 300 MG capsule, Take 300 mg by mouth 3 (three) times daily. Reported on 11/27/2015, Disp: , Rfl:  .  ibuprofen (ADVIL,MOTRIN) 200 MG tablet, Take 200 mg by mouth every 6 (six) hours as needed (patient takes 600 mg at night.). Reported on 09/14/2015, Disp: , Rfl:  .  lidocaine (LIDODERM) 5 %,  APPLY 1 PATCH 12 HOURS ON AND 12 HOURS OFF, Disp: 30 patch, Rfl: 0 .  metFORMIN (GLUCOPHAGE) 500 MG tablet, Take 1 tablet (500 mg total) by mouth 2 (two) times daily with a meal., Disp: 60 tablet, Rfl: 3 .  metoprolol tartrate (LOPRESSOR) 25 MG tablet, Take 1 tablet (25 mg total) by mouth 2 (two) times daily., Disp: 180 tablet, Rfl: 3 .  ondansetron (ZOFRAN ODT) 8 MG disintegrating tablet, Take 1 tablet (8 mg total) by mouth every 8 (eight) hours as needed for nausea or vomiting., Disp: 20 tablet, Rfl: 0 .  tapentadol (NUCYNTA) 50 MG TABS tablet, Take 50 mg by mouth every 6 (six) hours as needed. Reported on 11/27/2015, Disp: , Rfl:  .  sulfamethoxazole-trimethoprim (BACTRIM DS,SEPTRA DS) 800-160 MG tablet, Take 1 tablet by mouth 2 (two) times daily. (Patient not taking: Reported on 08/01/2017), Disp: 14 tablet, Rfl: 0  Review of Systems    Constitutional: Negative for appetite change, chills, fatigue and fever.  Respiratory: Negative for chest tightness and shortness of breath.   Cardiovascular: Negative for chest pain and palpitations.  Gastrointestinal: Negative for abdominal pain, nausea and vomiting.  Neurological: Negative for dizziness and weakness.   Social History   Tobacco Use  . Smoking status: Never Smoker  . Smokeless tobacco: Never Used  Substance Use Topics  . Alcohol use: No    Alcohol/week: 0.0 oz   Objective:   BP 118/60 (BP Location: Right Arm, Patient Position: Sitting, Cuff Size: Large)   Pulse 60   Temp 98.1 F (36.7 C) (Oral)   Resp 16   Ht 5\' 8"  (1.727 m)   Wt 241 lb (109.3 kg)   SpO2 99%   BMI 36.64 kg/m  Vitals:   08/01/17 1557  BP: 118/60  Pulse: 60  Resp: 16  Temp: 98.1 F (36.7 C)  TempSrc: Oral  SpO2: 99%  Weight: 241 lb (109.3 kg)  Height: 5\' 8"  (1.727 m)     Physical Exam  Constitutional: She is oriented to person, place, and time. She appears well-developed and well-nourished. No distress.  HENT:  Head: Normocephalic and atraumatic.  Right Ear: Hearing normal.  Left Ear: Hearing normal.  Nose: Nose normal.  Eyes: Conjunctivae and lids are normal. Right eye exhibits no discharge. Left eye exhibits no discharge. No scleral icterus.  Pulmonary/Chest: Effort normal. No respiratory distress.  Musculoskeletal: Normal range of motion.  Neurological: She is alert and oriented to person, place, and time.  Skin: Skin is intact. No lesion and no rash noted.  Psychiatric: She has a normal mood and affect. Her speech is normal and behavior is normal. Thought content normal.   Diabetic Foot Form - Detailed   Diabetic Foot Exam - detailed Diabetic Foot exam was performed with the following findings:  Yes 08/01/2017  4:44 PM  Visual Foot Exam completed.:  Yes  Can the patient see the bottom of their feet?:  Yes Are the shoes appropriate in style and fit?:  Yes Is there  swelling or and abnormal foot shape?:  No Is there a claw toe deformity?:  No Is there elevated skin temparature?:  No Is there foot or ankle muscle weakness?:  No Normal Range of Motion:  Yes Pulse Foot Exam completed.:  Yes  Right posterior Tibialias:  Present Left posterior Tibialias:  Present  Right Dorsalis Pedis:  Present Left Dorsalis Pedis:  Present  Sensory Foot Exam Completed.:  Yes Semmes-Weinstein Monofilament Test R Site 1-Great Toe:  Pos L Site  1-Great Toe:  Pos          Assessment & Plan:     1. Diabetes mellitus without complication ([place]) Been on Metformin 500 mg BID for 2-3 weeks. Hgb A1C was 8.8 on 07-11-17 - now 8.2. Will continue present dosage, given counseling regarding symptoms of hypoglycemia, demonstrated proper use of glucometer, ordered lancets and test strips to check FBS daily or if having hypoglycemic symptoms and scheduled [facility] Diabetic Education. Recheck appointment in 2 months. Should bring glucometer to the next appointment to review glucose readings. Schedule annual ophthalmology exam. Normal foot exam today. - POCT glycosylated hemoglobin (Hb A1C) - Lancets (ONETOUCH ULTRASOFT) lancets; Test fasting blood sugar before breakfast daily. Retest blood sugar if having symptoms of hypoglycemia.  Dispense: 100 each; Refill: 3 - glucose blood test strip; Test fasting blood sugar before breakfast daily. Recheck blood sugar if any symptoms of hypoglycemia.  Dispense: 100 each; Refill: 3 - Referral to Nutrition and Diabetes Services       [name], [facility]

## 2017-08-05 ENCOUNTER — Telehealth: Payer: Self-pay

## 2017-08-05 ENCOUNTER — Other Ambulatory Visit: Payer: Self-pay | Admitting: Family Medicine

## 2017-08-05 DIAGNOSIS — E119 Type 2 diabetes mellitus without complications: Secondary | ICD-10-CM

## 2017-08-05 MED ORDER — GLUCOSE BLOOD VI STRP: ORAL_STRIP | 3 refills | Status: DC

## 2017-08-05 NOTE — Telephone Encounter (Signed)
Reviewed chart and stripes were sent in today (3/26).

## 2017-08-05 NOTE — Progress Notes (Signed)
Pharmacy needed name of test strips on the prescription.

## 2017-08-05 NOTE — Telephone Encounter (Signed)
Patient called requesting [facility] [address]

## 2017-08-14 ENCOUNTER — Other Ambulatory Visit: Payer: Self-pay | Admitting: Family Medicine

## 2017-08-14 DIAGNOSIS — E119 Type 2 diabetes mellitus without complications: Secondary | ICD-10-CM

## 2017-08-14 DIAGNOSIS — I10 Essential (primary) hypertension: Secondary | ICD-10-CM

## 2017-08-14 NOTE — Telephone Encounter (Signed)
[facility] faxed a refill request for the following medications. Thanks CC  amLODipine (NORVASC) 10 MG tablet   metFORMIN (GLUCOPHAGE) 500 MG tablet   metoprolol tartrate (LOPRESSOR) 25 MG tablet   glucose blood test strip

## 2017-08-15 MED ORDER — AMLODIPINE BESYLATE 10 MG PO TABS: 10.0000 mg | ORAL_TABLET | Freq: Every day | ORAL | 3 refills | Status: DC

## 2017-08-15 NOTE — Telephone Encounter (Signed)
Metformin refilled for 3 months on 07-15-17, Metoprolol refilled for 1 year on 05-19-17 and test strips refilled for 1 year on 08-05-17 at [place]. Will send refill of Amlodipine to the pharmacy. [facility] and remind patient she has an appointment for follow up in early May (cancel April appointment).

## 2017-08-19 ENCOUNTER — Other Ambulatory Visit: Payer: Self-pay | Admitting: Family Medicine

## 2017-08-19 DIAGNOSIS — E119 Type 2 diabetes mellitus without complications: Secondary | ICD-10-CM

## 2017-08-19 DIAGNOSIS — I10 Essential (primary) hypertension: Secondary | ICD-10-CM

## 2017-08-19 NOTE — Telephone Encounter (Signed)
Patient has a OV scheduled for 08/21/17

## 2017-08-19 NOTE — Telephone Encounter (Signed)
LOV 08/01/2017.

## 2017-08-19 NOTE — Telephone Encounter (Addendum)
[facility] faxed a refill request for the following medications. Thanks CC  amLODipine (NORVASC) 10 MG tablet   metFORMIN (GLUCOPHAGE) 500 MG tablet   metoprolol tartrate (LOPRESSOR) 25 MG tablet   glucose blood test strip

## 2017-08-21 MED ORDER — METOPROLOL TARTRATE 25 MG PO TABS: 25.0000 mg | ORAL_TABLET | Freq: Two times a day (BID) | ORAL | 3 refills | Status: DC

## 2017-08-21 MED ORDER — METFORMIN HCL 500 MG PO TABS: 500.0000 mg | ORAL_TABLET | Freq: Two times a day (BID) | ORAL | 3 refills | Status: DC

## 2017-08-21 MED ORDER — AMLODIPINE BESYLATE 10 MG PO TABS: 10.0000 mg | ORAL_TABLET | Freq: Every day | ORAL | 3 refills | Status: DC

## 2017-08-22 ENCOUNTER — Ambulatory Visit (INDEPENDENT_AMBULATORY_CARE_PROVIDER_SITE_OTHER): Payer: Medicare Other | Admitting: Family Medicine

## 2017-08-22 ENCOUNTER — Ambulatory Visit: Payer: Medicare Other | Admitting: Family Medicine

## 2017-08-22 ENCOUNTER — Encounter: Payer: Self-pay | Admitting: Family Medicine

## 2017-08-22 VITALS — BP 146/78 | HR 92 | Temp 102.5°F | Resp 18 | Wt 246.0 lb

## 2017-08-22 DIAGNOSIS — N12 Tubulo-interstitial nephritis, not specified as acute or chronic: Secondary | ICD-10-CM | POA: Diagnosis not present

## 2017-08-22 LAB — POCT URINALYSIS DIPSTICK
Bilirubin, UA: NEGATIVE
Glucose, UA: NEGATIVE
Ketones, UA: NEGATIVE
Nitrite, UA: NEGATIVE
Spec Grav, UA: 1.015 (ref 1.010–1.025)
Urobilinogen, UA: 0.2 E.U./dL
pH, UA: 6 (ref 5.0–8.0)

## 2017-08-22 MED ORDER — CEFTRIAXONE SODIUM 500 MG IJ SOLR
500.0000 mg | Freq: Once | INTRAMUSCULAR | Status: AC
Start: 2017-08-22 — End: 2017-08-22
  Administered 2017-08-22: 1 g via INTRAMUSCULAR

## 2017-08-22 MED ORDER — CEFTRIAXONE SODIUM 1 G IJ SOLR: 1.0000 g | Freq: Once | INTRAMUSCULAR | Status: DC

## 2017-08-22 MED ORDER — SULFAMETHOXAZOLE-TRIMETHOPRIM 800-160 MG PO TABS: 1.0000 | ORAL_TABLET | Freq: Two times a day (BID) | ORAL | 0 refills | Status: AC

## 2017-08-22 NOTE — Patient Instructions (Signed)
Pyelonephritis, Adult Pyelonephritis is a kidney infection. The kidneys are the organs that filter a person's blood and move waste out of the bloodstream and into the urine. Urine passes from the kidneys, through the ureters, and into the bladder. There are two main types of pyelonephritis:  Infections that come on quickly without any warning (acute pyelonephritis).  Infections that last for a long period of time (chronic pyelonephritis).  In most cases, the infection clears up with treatment and does not cause further problems. More severe infections or chronic infections can sometimes spread to the bloodstream or lead to other problems with the kidneys. What are the causes? This condition is usually caused by:  Bacteria traveling from the bladder to the kidney through infected urine. The urine in the bladder can become infected with bacteria from: ? Bladder infection (cystitis). ? Inflammation of the prostate gland (prostatitis). ? Sexual intercourse, in females.  Bacteria traveling from the bloodstream to the kidney.  What increases the risk? This condition is more likely to develop in:  Pregnant women.  Older people.  People who have diabetes.  People who have kidney stones or bladder stones.  People who have other abnormalities of the kidney or ureter.  People who have a catheter placed in the bladder.  People who have cancer.  People who are sexually active.  Women who use spermicides.  People who have had a prior urinary tract infection.  What are the signs or symptoms? Symptoms of this condition include:  Frequent urination.  Strong or persistent urge to urinate.  Burning or stinging when urinating.  Abdominal pain.  Back pain.  Pain in the side or flank area.  Fever.  Chills.  Blood in the urine, or dark urine.  Nausea.  Vomiting.  How is this diagnosed? This condition may be diagnosed based on:  Medical history and physical exam.  Urine  tests.  Blood tests.  You may also have imaging tests of the kidneys, such as an ultrasound or CT scan. How is this treated? Treatment for this condition may depend on the severity of the infection.  If the infection is mild and is found early, you may be treated with antibiotic medicines taken by mouth. You will need to drink fluids to remain hydrated.  If the infection is more severe, you may need to stay in the hospital and receive antibiotics given directly into a vein through an IV tube. You may also need to receive fluids through an IV tube if you are not able to remain hydrated. After your hospital stay, you may need to take oral antibiotics for a period of time.  Other treatments may be required, depending on the cause of the infection. Follow these instructions at home: Medicines  Take over-the-counter and prescription medicines only as told by your health care provider.  If you were prescribed an antibiotic medicine, take it as told by your health care provider. Do not stop taking the antibiotic even if you start to feel better. General instructions  Drink enough fluid to keep your urine clear or pale yellow.  Avoid caffeine, tea, and carbonated beverages. They tend to irritate the bladder.  Urinate often. Avoid holding in urine for long periods of time.  Urinate before and after sex.  After a bowel movement, women should cleanse from front to back. Use each tissue only once.  Keep all follow-up visits as told by your health care provider. This is important. Contact a health care provider if:  Your symptoms   do not get better after 2 days of treatment.  Your symptoms get worse.  You have a fever. Get help right away if:  You are unable to take your antibiotics or fluids.  You have shaking chills.  You vomit.  You have severe flank or back pain.  You have extreme weakness or fainting. This information is not intended to replace advice given to you by your  health care provider. Make sure you discuss any questions you have with your health care provider. Document Released: 04/29/2005 Document Revised: 10/05/2015 Document Reviewed: 08/22/2014 Elsevier Interactive Patient Education  2018 [employer]vier Inc[employer]  

## 2017-08-22 NOTE — Progress Notes (Deleted)
Patient: Emily Stokes Female    DOB: [birth year]   57 y.o.   MRN: [id number] Visit Date: 08/22/2017  Today's Provider: [name], PA   No chief complaint on file.  Subjective:    Urinary Frequency   Associated symptoms include frequency.       No Known Allergies   Current Outpatient Medications:  .  amLODipine (NORVASC) 10 MG tablet, Take 1 tablet (10 mg total) by mouth daily., Disp: 90 tablet, Rfl: 3 .  aspirin 81 MG tablet, Take 81 mg by mouth daily., Disp: , Rfl:  .  gabapentin (NEURONTIN) 300 MG capsule, Take 300 mg by mouth 3 (three) times daily. Reported on 11/27/2015, Disp: , Rfl:  .  glucose blood test strip, Test fasting blood sugar before breakfast daily. Recheck blood sugar if any symptoms of hypoglycemia., Disp: 100 each, Rfl: 3 .  ibuprofen (ADVIL,MOTRIN) 200 MG tablet, Take 200 mg by mouth every 6 (six) hours as needed (patient takes 600 mg at night.). Reported on 09/14/2015, Disp: , Rfl:  .  Lancets (ONETOUCH ULTRASOFT) lancets, Test fasting blood sugar before breakfast daily. Retest blood sugar if having symptoms of hypoglycemia., Disp: 100 each, Rfl: 3 .  lidocaine (LIDODERM) 5 %, APPLY 1 PATCH 12 HOURS ON AND 12 HOURS OFF, Disp: 30 patch, Rfl: 0 .  metFORMIN (GLUCOPHAGE) 500 MG tablet, Take 1 tablet (500 mg total) by mouth 2 (two) times daily with a meal., Disp: 180 tablet, Rfl: 3 .  metoprolol tartrate (LOPRESSOR) 25 MG tablet, Take 1 tablet (25 mg total) by mouth 2 (two) times daily., Disp: 180 tablet, Rfl: 3 .  ondansetron (ZOFRAN ODT) 8 MG disintegrating tablet, Take 1 tablet (8 mg total) by mouth every 8 (eight) hours as needed for nausea or vomiting., Disp: 20 tablet, Rfl: 0 .  sulfamethoxazole-trimethoprim (BACTRIM DS,SEPTRA DS) 800-160 MG tablet, Take 1 tablet by mouth 2 (two) times daily. (Patient not taking: Reported on 08/01/2017), Disp: 14 tablet, Rfl: 0 .  tapentadol (NUCYNTA) 50 MG TABS tablet, Take 50 mg by mouth every 6 (six) hours as  needed. Reported on 11/27/2015, Disp: , Rfl:   Current Facility-Administered Medications:  .  bupivacaine (PF) (MARCAINE) 0.25 % injection 30 mL, 30 mL, Other, Once, Mohammed Kindle, MD .  ceFAZolin (ANCEF) IVPB 1 g/50 mL premix, 1 g, Intravenous, Once, Mohammed Kindle, MD .  lactated ringers infusion 1,000 mL, 1,000 mL, Intravenous, Continuous, Mohammed Kindle, MD .  lactated ringers infusion 1,000 mL, 1,000 mL, Intravenous, Continuous, Mohammed Kindle, MD, Last Rate: 125 mL/hr at 11/06/15 1200, 1,000 mL at 11/06/15 1200 .  lactated ringers infusion 1,000 mL, 1,000 mL, Intravenous, Continuous, Mohammed Kindle, MD, Last Rate: 125 mL/hr at 12/04/15 1243, 1,000 mL at 12/04/15 1243 .  lactated ringers infusion 1,000 mL, 1,000 mL, Intravenous, Continuous, Mohammed Kindle, MD .  lidocaine (PF) (XYLOCAINE) 1 % injection 10 mL, 10 mL, Subcutaneous, Once, Mohammed Kindle, MD .  sodium chloride flush (NS) 0.9 % injection 20 mL, 20 mL, Other, Once, Mohammed Kindle, MD .  triamcinolone acetonide (KENALOG-40) injection 40 mg, 40 mg, Other, Once, Mohammed Kindle, MD  Review of Systems  Constitutional: Negative.   Respiratory: Negative.   Cardiovascular: Negative.   Genitourinary: Positive for frequency.    Social History   Tobacco Use  . Smoking status: Never Smoker  . Smokeless tobacco: Never Used  Substance Use Topics  . Alcohol use: No    Alcohol/week: 0.0 oz   Objective:  There were no vitals taken for this visit.   Physical Exam      Assessment & Plan:           [name], PA  [facility]

## 2017-08-22 NOTE — Progress Notes (Signed)
Patient: Emily Stokes Female    DOB: 02/11/1961   57 y.o.   MRN: [id number] Visit Date: 08/22/2017  Today's Provider: Lavon Paganini, MD   Chief Complaint  Patient presents with  . Urinary Frequency   Subjective:    HPI Pt is here today for urinary frequency. She reports that she has been having this for about a week. She has chills, back pain (L sided) and urgency. No dysuria or fever. She was trreated for UTI about a month ago. She went to ER and she had the same symptoms as she does today.     No Known Allergies   Current Outpatient Medications:  .  amLODipine (NORVASC) 10 MG tablet, Take 1 tablet (10 mg total) by mouth daily., Disp: 90 tablet, Rfl: 3 .  aspirin 81 MG tablet, Take 81 mg by mouth daily., Disp: , Rfl:  .  gabapentin (NEURONTIN) 300 MG capsule, Take 300 mg by mouth 3 (three) times daily. Reported on 11/27/2015, Disp: , Rfl:  .  glucose blood test strip, Test fasting blood sugar before breakfast daily. Recheck blood sugar if any symptoms of hypoglycemia., Disp: 100 each, Rfl: 3 .  ibuprofen (ADVIL,MOTRIN) 200 MG tablet, Take 200 mg by mouth every 6 (six) hours as needed (patient takes 600 mg at night.). Reported on 09/14/2015, Disp: , Rfl:  .  Lancets (ONETOUCH ULTRASOFT) lancets, Test fasting blood sugar before breakfast daily. Retest blood sugar if having symptoms of hypoglycemia., Disp: 100 each, Rfl: 3 .  lidocaine (LIDODERM) 5 %, APPLY 1 PATCH 12 HOURS ON AND 12 HOURS OFF, Disp: 30 patch, Rfl: 0 .  metFORMIN (GLUCOPHAGE) 500 MG tablet, Take 1 tablet (500 mg total) by mouth 2 (two) times daily with a meal., Disp: 180 tablet, Rfl: 3 .  metoprolol tartrate (LOPRESSOR) 25 MG tablet, Take 1 tablet (25 mg total) by mouth 2 (two) times daily., Disp: 180 tablet, Rfl: 3 .  ondansetron (ZOFRAN ODT) 8 MG disintegrating tablet, Take 1 tablet (8 mg total) by mouth every 8 (eight) hours as needed for nausea or vomiting., Disp: 20 tablet, Rfl: 0 .   sulfamethoxazole-trimethoprim (BACTRIM DS,SEPTRA DS) 800-160 MG tablet, Take 1 tablet by mouth 2 (two) times daily for 10 days., Disp: 20 tablet, Rfl: 0 .  tapentadol (NUCYNTA) 50 MG TABS tablet, Take 50 mg by mouth every 6 (six) hours as needed. Reported on 11/27/2015, Disp: , Rfl:   Current Facility-Administered Medications:  .  bupivacaine (PF) (MARCAINE) 0.25 % injection 30 mL, 30 mL, Other, Once, Mohammed Kindle, MD .  ceFAZolin (ANCEF) IVPB 1 g/50 mL premix, 1 g, Intravenous, Once, Mohammed Kindle, MD .  lactated ringers infusion 1,000 mL, 1,000 mL, Intravenous, Continuous, Mohammed Kindle, MD .  lactated ringers infusion 1,000 mL, 1,000 mL, Intravenous, Continuous, Mohammed Kindle, MD, Last Rate: 125 mL/hr at 11/06/15 1200, 1,000 mL at 11/06/15 1200 .  lactated ringers infusion 1,000 mL, 1,000 mL, Intravenous, Continuous, Mohammed Kindle, MD, Last Rate: 125 mL/hr at 12/04/15 1243, 1,000 mL at 12/04/15 1243 .  lactated ringers infusion 1,000 mL, 1,000 mL, Intravenous, Continuous, Mohammed Kindle, MD .  lidocaine (PF) (XYLOCAINE) 1 % injection 10 mL, 10 mL, Subcutaneous, Once, Mohammed Kindle, MD .  sodium chloride flush (NS) 0.9 % injection 20 mL, 20 mL, Other, Once, Mohammed Kindle, MD .  triamcinolone acetonide (KENALOG-40) injection 40 mg, 40 mg, Other, Once, Mohammed Kindle, MD  Review of Systems  Constitutional: Positive for chills and fatigue.  HENT:  Negative.   Eyes: Negative.   Respiratory: Negative.   Cardiovascular: Negative.   Gastrointestinal: Negative.   Endocrine: Negative.   Genitourinary: Positive for enuresis, flank pain, frequency and urgency.  Musculoskeletal: Positive for back pain.  Skin: Negative.   Allergic/Immunologic: Negative.   Neurological: Negative.   Hematological: Negative.   Psychiatric/Behavioral: Negative.     Social History   Tobacco Use  . Smoking status: Never Smoker  . Smokeless tobacco: Never Used  Substance Use Topics  . Alcohol use: No     Alcohol/week: 0.0 oz   Objective:   BP (!) 146/78 (BP Location: Left Arm, Patient Position: Sitting, Cuff Size: Large)   Pulse 92   Temp (!) 102.5 F (39.2 C) (Oral)   Resp 18   Wt 246 lb (111.6 kg)   SpO2 93%   BMI 37.40 kg/m  Vitals:   08/22/17 1453  BP: (!) 146/78  Pulse: 92  Resp: 18  Temp: (!) 102.5 F (39.2 C)  TempSrc: Oral  SpO2: 93%  Weight: 246 lb (111.6 kg)     Physical Exam  Constitutional: She is oriented to person, place, and time. She appears well-developed and well-nourished. No distress.  HENT:  Head: Normocephalic and atraumatic.  Eyes: Conjunctivae are normal. No scleral icterus.  Cardiovascular: Normal rate, regular rhythm, normal heart sounds and intact distal pulses.  No murmur heard. Pulmonary/Chest: Effort normal and breath sounds normal. No respiratory distress. She has no wheezes. She has no rales.  Abdominal: Soft. Bowel sounds are normal. She exhibits no distension and no mass. There is tenderness in the right lower quadrant, suprapubic area and left lower quadrant. There is CVA tenderness. There is no rigidity, no rebound and no guarding.  Musculoskeletal: She exhibits no edema or deformity.  Neurological: She is alert and oriented to person, place, and time.  Skin: Skin is warm and dry. Capillary refill takes less than 2 seconds. No rash noted. No pallor.  Psychiatric: She has a normal mood and affect. Her behavior is normal.  Vitals reviewed.   Results for orders placed or performed in visit on 08/22/17  POCT urinalysis dipstick  Result Value Ref Range   Color, UA light yellow    Clarity, UA cloudy    Glucose, UA neg    Bilirubin, UA neg    Ketones, UA neg    Spec Grav, UA 1.015 1.010 - 1.025   Blood, UA [place] trace    pH, UA 6.0 5.0 - 8.0   Protein, UA trace    Urobilinogen, UA 0.2 0.2 or 1.0 E.U./dL   Nitrite, UA neg    Leukocytes, UA Large (3+) (A) Negative   Appearance     Odor         Assessment & Plan:   1.  Pyelonephritis - symptoms concerning for pyelonephritis with fever, urinary symptoms, large leuks in urine, and CVA tenderness - will send urine culture and sensitivities - given IM Ceftriaxone 1g in clinic - start Bactrim 10 day course - recently treated with Keflex, so chose different antibiotic - strict return precautions discussed, but currently stable enoguh for d/c home - POCT urinalysis dipstick - cefTRIAXone (ROCEPHIN) injection 1 g   Meds ordered this encounter  Medications  . sulfamethoxazole-trimethoprim (BACTRIM DS,SEPTRA DS) 800-160 MG tablet    Sig: Take 1 tablet by mouth 2 (two) times daily for 10 days.    Dispense:  20 tablet    Refill:  0  . DISCONTD: cefTRIAXone (ROCEPHIN) injection 1 g  .  cefTRIAXone (ROCEPHIN) injection 500 mg     Return if symptoms worsen or fail to improve.   The entirety of the information documented in the History of Present Illness, Review of Systems and Physical Exam were personally obtained by me. Portions of this information were initially documented by [place] [place] and reviewed by me for thoroughness and accuracy.    Virginia Crews, MD, MPH [facility] 08/22/2017 4:09 PM

## 2017-08-25 ENCOUNTER — Telehealth: Payer: Self-pay

## 2017-08-25 ENCOUNTER — Other Ambulatory Visit: Payer: Self-pay

## 2017-08-25 DIAGNOSIS — E119 Type 2 diabetes mellitus without complications: Secondary | ICD-10-CM

## 2017-08-25 LAB — URINE CULTURE

## 2017-08-25 MED ORDER — GLUCOSE BLOOD VI STRP: ORAL_STRIP | 4 refills | Status: DC

## 2017-08-25 NOTE — Telephone Encounter (Signed)
[facility] is requesting a refill. [facility]. According to refill message [facility] was suppose [facility] patient's request.  LMTCB to see if patient wants the RX sent to [facility] or [facility]

## 2017-08-25 NOTE — Telephone Encounter (Signed)
-----   Message from Virginia Crews, MD sent at 08/25/2017  8:23 AM EDT ----- Urine culture shows E Coli UTI that is sensitive to the antibiotics.  Virginia Crews, MD, MPH [facility] 08/25/2017 8:22 AM

## 2017-08-25 NOTE — Telephone Encounter (Signed)
Patient wants it sent to [facility] because it is cheaper.

## 2017-08-25 NOTE — Telephone Encounter (Signed)
Tried calling; no answer.   Thanks,   -Anari Evitt  

## 2017-08-27 NOTE — Telephone Encounter (Signed)
Tried calling, NA.

## 2017-09-09 ENCOUNTER — Other Ambulatory Visit: Payer: Self-pay | Admitting: Family Medicine

## 2017-09-09 ENCOUNTER — Other Ambulatory Visit: Payer: Self-pay

## 2017-09-09 DIAGNOSIS — E119 Type 2 diabetes mellitus without complications: Secondary | ICD-10-CM

## 2017-09-09 NOTE — Telephone Encounter (Signed)
Advise her to take her lancing apparatus to the pharmacy to match it with the lancets. That way we will order the proper lancets.

## 2017-09-09 NOTE — Telephone Encounter (Signed)
Patient states the One touch Ultrasoft lancets are to big for her glucometer. Patient uses a One touch Verio flex glucometer. She would like a new RX for lancets be sent to [facility].

## 2017-09-09 NOTE — Telephone Encounter (Signed)
Patient advised. She verbalized understanding.  

## 2017-10-03 ENCOUNTER — Ambulatory Visit (INDEPENDENT_AMBULATORY_CARE_PROVIDER_SITE_OTHER): Payer: Medicare Other | Admitting: Family Medicine

## 2017-10-03 ENCOUNTER — Encounter: Payer: Self-pay | Admitting: Family Medicine

## 2017-10-03 VITALS — BP 126/80 | HR 74 | Temp 98.2°F | Resp 16 | Ht 68.0 in | Wt 232.0 lb

## 2017-10-03 DIAGNOSIS — E7800 Pure hypercholesterolemia, unspecified: Secondary | ICD-10-CM

## 2017-10-03 DIAGNOSIS — M545 Low back pain: Secondary | ICD-10-CM | POA: Diagnosis not present

## 2017-10-03 DIAGNOSIS — I10 Essential (primary) hypertension: Secondary | ICD-10-CM | POA: Diagnosis not present

## 2017-10-03 DIAGNOSIS — M47816 Spondylosis without myelopathy or radiculopathy, lumbar region: Secondary | ICD-10-CM

## 2017-10-03 DIAGNOSIS — E119 Type 2 diabetes mellitus without complications: Secondary | ICD-10-CM | POA: Diagnosis not present

## 2017-10-03 LAB — POCT GLYCOSYLATED HEMOGLOBIN (HGB A1C)
Est. average glucose Bld gHb Est-mCnc: 157
Hemoglobin A1C: 7.1 % — AB (ref 4.0–5.6)

## 2017-10-03 MED ORDER — ONETOUCH DELICA LANCETS FINE MISC: 4 refills | Status: DC

## 2017-10-03 MED ORDER — LIDOCAINE 5 % EX PTCH: MEDICATED_PATCH | CUTANEOUS | 0 refills | Status: DC

## 2017-10-03 NOTE — Progress Notes (Signed)
Patient: Emily Stokes Female    DOB: [birth year]   57 y.o.   MRN: [id number] Visit Date: 10/03/2017  Today's Provider: [name], PA   Chief Complaint  Patient presents with  . Diabetes   Subjective:    HPI  Diabetes Mellitus Type II, Follow-up:   Lab Results  Component Value Date   HGBA1C 7.1 (A) 10/03/2017   HGBA1C 8.2 08/01/2017   HGBA1C 8.8 (H) 07/11/2017    Last seen for diabetes 2 months ago.  Management since then includes adding Metformin 500mg  BID. She reports good compliance with treatment. She is not having side effects.  Current symptoms include none and have been stable. Home blood sugar records: trend: fluctuating a bit  Episodes of hypoglycemia? no   Current Insulin Regimen: none Most Recent Eye Exam: due Weight trend: decreasing steadily Prior visit with dietician: no Current diet: well balanced Current exercise: no regular exercise  Pertinent Labs:    Component Value Date/Time   CREATININE 1.09 (H) 07/07/2017 1622   CREATININE 1.16 03/25/2014 0925    Wt Readings from Last 3 Encounters:  10/03/17 232 lb (105.2 kg)  08/22/17 246 lb (111.6 kg)  08/01/17 241 lb (109.3 kg)     Patient reports that the lancets that she has at home is too big. She has not been able to check blood sugars often. She reports that her blood sugars fluctuate.   No Known Allergies   Current Outpatient Medications:  .  amLODipine (NORVASC) 10 MG tablet, Take 1 tablet (10 mg total) by mouth daily., Disp: 90 tablet, Rfl: 3 .  aspirin 81 MG tablet, Take 81 mg by mouth daily., Disp: , Rfl:  .  gabapentin (NEURONTIN) 300 MG capsule, Take 300 mg by mouth 3 (three) times daily. Reported on 11/27/2015, Disp: , Rfl:  .  glucose blood test strip, Test fasting blood sugar before breakfast daily. Recheck blood sugar if any symptoms of hypoglycemia., Disp: 100 each, Rfl: 4 .  ibuprofen (ADVIL,MOTRIN) 200 MG tablet, Take 200 mg by mouth every 6 (six) hours as needed  (patient takes 600 mg at night.). Reported on 09/14/2015, Disp: , Rfl:  .  Lancets (ONETOUCH ULTRASOFT) lancets, Test fasting blood sugar before breakfast daily. Retest blood sugar if having symptoms of hypoglycemia., Disp: 100 each, Rfl: 3 .  lidocaine (LIDODERM) 5 %, APPLY 1 PATCH 12 HOURS ON AND 12 HOURS OFF, Disp: 30 patch, Rfl: 0 .  metFORMIN (GLUCOPHAGE) 500 MG tablet, Take 1 tablet (500 mg total) by mouth 2 (two) times daily with a meal., Disp: 180 tablet, Rfl: 3 .  metoprolol tartrate (LOPRESSOR) 25 MG tablet, Take 1 tablet (25 mg total) by mouth 2 (two) times daily., Disp: 180 tablet, Rfl: 3 .  ondansetron (ZOFRAN ODT) 8 MG disintegrating tablet, Take 1 tablet (8 mg total) by mouth every 8 (eight) hours as needed for nausea or vomiting., Disp: 20 tablet, Rfl: 0 .  tapentadol (NUCYNTA) 50 MG TABS tablet, Take 50 mg by mouth every 6 (six) hours as needed. Reported on 11/27/2015, Disp: , Rfl:   Current Facility-Administered Medications:  .  bupivacaine (PF) (MARCAINE) 0.25 % injection 30 mL, 30 mL, Other, Once, Mohammed Kindle, MD .  ceFAZolin (ANCEF) IVPB 1 g/50 mL premix, 1 g, Intravenous, Once, Mohammed Kindle, MD .  lactated ringers infusion 1,000 mL, 1,000 mL, Intravenous, Continuous, Mohammed Kindle, MD .  lactated ringers infusion 1,000 mL, 1,000 mL, Intravenous, Continuous, Mohammed Kindle, MD, Last Rate:  125 mL/hr at 11/06/15 1200, 1,000 mL at 11/06/15 1200 .  lactated ringers infusion 1,000 mL, 1,000 mL, Intravenous, Continuous, Mohammed Kindle, MD, Last Rate: 125 mL/hr at 12/04/15 1243, 1,000 mL at 12/04/15 1243 .  lactated ringers infusion 1,000 mL, 1,000 mL, Intravenous, Continuous, Mohammed Kindle, MD .  lidocaine (PF) (XYLOCAINE) 1 % injection 10 mL, 10 mL, Subcutaneous, Once, Mohammed Kindle, MD .  sodium chloride flush (NS) 0.9 % injection 20 mL, 20 mL, Other, Once, Mohammed Kindle, MD .  triamcinolone acetonide (KENALOG-40) injection 40 mg, 40 mg, Other, Once, Mohammed Kindle,  MD  Review of Systems  Constitutional: Negative.   Respiratory: Negative.   Cardiovascular: Negative.   Endocrine: Negative for cold intolerance, heat intolerance, polydipsia, polyphagia and polyuria.  Musculoskeletal: Negative.   Neurological: Negative for dizziness and headaches.  Psychiatric/Behavioral: Negative.     Social History   Tobacco Use  . Smoking status: Never Smoker  . Smokeless tobacco: Never Used  Substance Use Topics  . Alcohol use: No    Alcohol/week: 0.0 oz   Objective:   BP 126/80 (BP Location: Right Arm, Patient Position: Sitting, Cuff Size: Large)   Pulse 74   Temp 98.2 F (36.8 C)   Resp 16   Ht 5\' 8"  (1.727 m)   Wt 232 lb (105.2 kg)   SpO2 94%   BMI 35.28 kg/m  Vitals:   10/03/17 1349  BP: 126/80  Pulse: 74  Resp: 16  Temp: 98.2 F (36.8 C)  SpO2: 94%  Weight: 232 lb (105.2 kg)  Height: 5\' 8"  (1.727 m)   Physical Exam  Constitutional: She is oriented to person, place, and time. She appears well-developed and well-nourished. No distress.  HENT:  Head: Normocephalic and atraumatic.  Right Ear: Hearing normal.  Left Ear: Hearing normal.  Nose: Nose normal.  Eyes: Conjunctivae and lids are normal. Right eye exhibits no discharge. Left eye exhibits no discharge. No scleral icterus.  Cardiovascular: Normal rate and regular rhythm.  Pulmonary/Chest: Effort normal. No respiratory distress.  Abdominal: Soft. Bowel sounds are normal.  Musculoskeletal: Normal range of motion.  Neurological: She is alert and oriented to person, place, and time.  Skin: Skin is intact. No lesion and no rash noted.  Psychiatric: She has a normal mood and affect. Her speech is normal and behavior is normal. Thought content normal.      Assessment & Plan:      1. Diabetes mellitus without complication (HCC) Tolerating Metformin 500 mg BID and trying to control diet. Has lost 14 lbs since 08-22-17. Hgb A1C down to 7.1% today. Denies vision changes, polydipsia or  polyuria. Requests change in lancet because the last ones did not fit her lancing device. Recheck lipid panel, CMP and CBC. Foot exam up to date without issues. Encouraged to get ophthalmology exam. Recheck pending reports. - POCT glycosylated hemoglobin (Hb C6C) - [place]; Test fasting blood sugar each morning.  Dispense: 100 each; Refill: 4 - Lipid panel - Comprehensive metabolic panel - CBC with Differential/Platelet  2. Essential hypertension Well controlled BP on the Amlodipine 10 mg qd without swelling side effects. No chest pains, palpitations or significant dyspnea. Recheck CMP and CBC. Continue present regimen. - Comprehensive metabolic panel - CBC with Differential/Platelet  3. Hypercholesteremia History of elevated lipids in 2012. Will recheck levels. - Lipid panel - Comprehensive metabolic panel  4. Acute low back pain, unspecified back pain laterality, with sciatica presence unspecified Pain in the left hip and leg  area better controlled by use of Lidoderm patch at night. Allows her to sleep better. Will refill through her mail order pharmacy. - lidocaine (LIDODERM) 5 %; APPLY 1 PATCH 12 HOURS ON AND 12 HOURS OFF  Dispense: 90 patch; Refill: 0  5. Facet syndrome, lumbar Last injection treatment by Dr. Primus B[facility] was 01-22-16. Feels the Lidoderm patches helps control pain well enough to allow her to sleep at night. Follow up at pain management as needed. - lidocaine (LIDODERM) 5 %; APPLY 1 PATCH 12 HOURS ON AND 12 HOURS OFF  Dispense: 90 patch; Refill: [place], PA  [facility]

## 2017-10-09 ENCOUNTER — Telehealth: Payer: Self-pay | Admitting: Family Medicine

## 2017-10-09 NOTE — Telephone Encounter (Signed)
Remind her, we have not been able to get the Lidoderm Patches. If the pain clinic tries to get them, they may have better luck being a specialty office.

## 2017-10-09 NOTE — Telephone Encounter (Signed)
You ordered a referral to pain clinic in Feb 2019.Pt has not returned calls to schedule.I also gave her their contact information.Just FYI

## 2017-10-10 ENCOUNTER — Telehealth: Payer: Self-pay | Admitting: Family Medicine

## 2017-10-10 DIAGNOSIS — G894 Chronic pain syndrome: Secondary | ICD-10-CM

## 2017-10-10 NOTE — Telephone Encounter (Signed)
New referral ordered

## 2017-10-10 NOTE — Telephone Encounter (Signed)
Patient advised. Patient also advised that pain clinic was trying to contact her to schedule appointment. Patient states she will call them today to schedule.

## 2017-10-10 NOTE — Telephone Encounter (Signed)
Pt tried calling pain clinic to set up appointment and they are requesting a new referral be put in

## 2017-10-17 DIAGNOSIS — I10 Essential (primary) hypertension: Secondary | ICD-10-CM | POA: Diagnosis not present

## 2017-10-17 DIAGNOSIS — E7800 Pure hypercholesterolemia, unspecified: Secondary | ICD-10-CM | POA: Diagnosis not present

## 2017-10-17 DIAGNOSIS — E119 Type 2 diabetes mellitus without complications: Secondary | ICD-10-CM | POA: Diagnosis not present

## 2017-10-18 LAB — LIPID PANEL
Chol/HDL Ratio: 5.1 ratio — ABNORMAL HIGH (ref 0.0–4.4)
Cholesterol, Total: 204 mg/dL — ABNORMAL HIGH (ref 100–199)
HDL: 40 mg/dL (ref >39)
LDL Calculated: 144 mg/dL — ABNORMAL HIGH (ref 0–99)
Triglycerides: 99 mg/dL (ref 0–149)
VLDL Cholesterol Cal: 20 mg/dL (ref 5–40)

## 2017-10-18 LAB — COMPREHENSIVE METABOLIC PANEL
ALT: 13 IU/L (ref 0–32)
AST: 13 IU/L (ref 0–40)
Albumin/Globulin Ratio: 1.3 (ref 1.2–2.2)
Albumin: 4.5 g/dL (ref 3.5–5.5)
Alkaline Phosphatase: 104 IU/L (ref 39–117)
BUN/Creatinine Ratio: 16 (ref 9–23)
BUN: 17 mg/dL (ref 6–24)
Bilirubin Total: 0.4 mg/dL (ref 0.0–1.2)
CO2: 21 mmol/L (ref 20–29)
Calcium: 9.6 mg/dL (ref 8.7–10.2)
Chloride: 107 mmol/L — ABNORMAL HIGH (ref 96–106)
Creatinine, Ser: 1.07 mg/dL — ABNORMAL HIGH (ref 0.57–1.00)
GFR calc Af Amer: 67 mL/min/{1.73_m2} (ref >59)
GFR calc non Af Amer: 58 mL/min/{1.73_m2} — ABNORMAL LOW (ref >59)
Globulin, Total: 3.4 g/dL (ref 1.5–4.5)
Glucose: 120 mg/dL — ABNORMAL HIGH (ref 65–99)
Potassium: 3.9 mmol/L (ref 3.5–5.2)
Sodium: 143 mmol/L (ref 134–144)
Total Protein: 7.9 g/dL (ref 6.0–8.5)

## 2017-10-18 LAB — CBC WITH DIFFERENTIAL/PLATELET
Basophils Absolute: 0 10*3/uL (ref 0.0–0.2)
Basos: 0 %
EOS (ABSOLUTE): 0.2 10*3/uL (ref 0.0–0.4)
Eos: 3 %
Hematocrit: 36.7 % (ref 34.0–46.6)
Hemoglobin: 12.7 g/dL (ref 11.1–15.9)
Immature Grans (Abs): 0 10*3/uL (ref 0.0–0.1)
Immature Granulocytes: 0 %
Lymphocytes Absolute: 2.6 10*3/uL (ref 0.7–3.1)
Lymphs: 49 %
MCH: 32.2 pg (ref 26.6–33.0)
MCHC: 34.6 g/dL (ref 31.5–35.7)
MCV: 93 fL (ref 79–97)
Monocytes Absolute: 0.4 10*3/uL (ref 0.1–0.9)
Monocytes: 7 %
Neutrophils Absolute: 2.2 10*3/uL (ref 1.4–7.0)
Neutrophils: 41 %
Platelets: 337 10*3/uL (ref 150–450)
RBC: 3.94 x10E6/uL (ref 3.77–5.28)
RDW: 15 % (ref 12.3–15.4)
WBC: 5.4 10*3/uL (ref 3.4–10.8)

## 2017-10-20 ENCOUNTER — Telehealth: Payer: Self-pay

## 2017-10-20 MED ORDER — ATORVASTATIN CALCIUM 20 MG PO TABS: 20.0000 mg | ORAL_TABLET | Freq: Every day | ORAL | 0 refills | Status: DC

## 2017-10-20 NOTE — Telephone Encounter (Signed)
-----   Message from Ola Spurr sent at 10/20/2017  3:50 PM EDT ----- Review incoming fax

## 2017-10-20 NOTE — Telephone Encounter (Signed)
Contacted [facility]. They needed to know supervising MD name and NPI # for [facility].

## 2017-10-20 NOTE — Telephone Encounter (Signed)
-----   Message from Margo Common, [place] sent at 10/20/2017  8:20 AM EDT ----- Diabetes in good control. Kidney function shows only slight strain. Recommend drinking extra water in diet. LDL cholesterol is high. American Diabetic Association recommends all diabetics have a statin in their medication regimen. Recommend Atorvastatin 20 mg qd #30 & 3 RF and recheck levels in 3 months.

## 2017-10-20 NOTE — Telephone Encounter (Signed)
Patient advised. Rx sent to [facility]. 3 month follow up scheduled.

## 2017-10-31 ENCOUNTER — Ambulatory Visit (INDEPENDENT_AMBULATORY_CARE_PROVIDER_SITE_OTHER): Payer: Medicare Other

## 2017-10-31 ENCOUNTER — Ambulatory Visit (INDEPENDENT_AMBULATORY_CARE_PROVIDER_SITE_OTHER): Payer: Medicare Other | Admitting: Family Medicine

## 2017-10-31 ENCOUNTER — Encounter: Payer: Self-pay | Admitting: Family Medicine

## 2017-10-31 VITALS — BP 116/72 | HR 80 | Temp 98.3°F | Ht 68.0 in | Wt 235.8 lb

## 2017-10-31 VITALS — BP 120/76 | HR 68 | Temp 98.1°F | Resp 16 | Wt 235.8 lb

## 2017-10-31 DIAGNOSIS — Z1211 Encounter for screening for malignant neoplasm of colon: Secondary | ICD-10-CM | POA: Diagnosis not present

## 2017-10-31 DIAGNOSIS — R350 Frequency of micturition: Secondary | ICD-10-CM | POA: Diagnosis not present

## 2017-10-31 DIAGNOSIS — N3090 Cystitis, unspecified without hematuria: Secondary | ICD-10-CM

## 2017-10-31 DIAGNOSIS — Z0000 Encounter for general adult medical examination without abnormal findings: Secondary | ICD-10-CM

## 2017-10-31 LAB — POCT URINALYSIS DIPSTICK
Glucose, UA: NEGATIVE
Ketones, UA: NEGATIVE
Nitrite, UA: NEGATIVE
Protein, UA: POSITIVE — AB
Spec Grav, UA: 1.015 (ref 1.010–1.025)
Urobilinogen, UA: 0.2 E.U./dL
pH, UA: 5 (ref 5.0–8.0)

## 2017-10-31 MED ORDER — SULFAMETHOXAZOLE-TRIMETHOPRIM 800-160 MG PO TABS: 1.0000 | ORAL_TABLET | Freq: Two times a day (BID) | ORAL | 0 refills | Status: DC

## 2017-10-31 NOTE — Progress Notes (Addendum)
Subjective:   Emily Stokes is a 57 y.o. female who presents for Medicare Annual (Subsequent) preventive examination.  Review of Systems:  N/A  Cardiac Risk Factors include: advanced age (>20men, >1 women);diabetes mellitus;hypertension;obesity (BMI >30kg/m2);dyslipidemia     Objective:     Vitals: BP 116/72 (BP Location: Right Arm)   Pulse 80   Temp 98.3 F (36.8 C) (Oral)   Ht 5\' 8"  (1.727 m)   Wt 235 lb 12.8 oz (107 kg)   BMI 35.85 kg/m   Body mass index is 35.85 kg/m.  Advanced Directives 10/31/2017 07/07/2017 10/24/2016 01/22/2016 01/08/2016 12/04/2015 11/27/2015  Does Patient Have a Medical Advance Directive? No No No No No No No  Would patient like information on creating a medical advance directive? No - Patient declined - No - Patient declined - No - patient declined information - -    Tobacco Social History   Tobacco Use  Smoking Status Never Smoker  Smokeless Tobacco Never Used     Counseling given: Not Answered   Clinical Intake:  Pre-visit preparation completed: Yes  Pain : 0-10 Pain Score: 9  Pain Type: Chronic pain Pain Location: Leg Pain Orientation: Left Pain Descriptors / Indicators: Stabbing Pain Frequency: Constant     Nutritional Status: BMI > 30  Obese Nutritional Risks: None Diabetes: Yes(type 2) CBG done?: No Did pt. bring in CBG monitor from home?: No  How often do you need to have someone help you when you read instructions, pamphlets, or other written materials from your doctor or pharmacy?: 3 - Sometimes(Gets blurred vision when reads small print. Does not wear glasses. )  Interpreter Needed?: No  Information entered by :: [facility], LPN  Past Medical History:  Diagnosis Date  . Abdominal pain, epigastric   . Anemia   . Backache   . Bronchitis   . Calculus, kidney   . Carpal tunnel syndrome   . Chest pain   . Chicken pox   . Circulatory disease   . Disorder of kidney and ureter   . Excess, menstruation   .  Hypercholesteremia   . Hypertension, essential, benign   . Infected postoperative seroma   . Iron deficiency   . Malaise and fatigue   . Measles   . MRSA (methicillin resistant staph aureus) culture positive   . Nausea alone   . Nonspecific abnormal electrocardiogram (ECG) (EKG)   . Pain in joint, lower leg    Past Surgical History:  Procedure Laterality Date  . ABDOMINAL HYSTERECTOMY    . CESAREAN SECTION    . COLONOSCOPY  1997  . COLOSTOMY TAKEDOWN  2012  . Hartmann's procedure.    Marland Kitchen HERNIA REPAIR  1937   umbilical  . LITHOTRIPSY  9024   with complications, hospitalized due to these complications for 3 months  . mrsa     removal on nose  . PARTIAL HYSTERECTOMY  2009    vaginal hysterectomy, has both ovaries   Family History  Problem Relation Age of Onset  . Hypertension Father   . Stroke Father   . Diabetes Mother   . Hypertension Mother   . Diabetes Other        sibling  . Diabetes Other        sibling  . Diabetes Other        sibling  . Hypertension Other        sibling  . Hypertension Other        sibling  . Hypertension Other  sibling   Social History   Socioeconomic History  . Marital status: Widowed    Spouse name: Not on file  . Number of children: 4  . Years of education: Not on file  . Highest education level: Bachelor's degree (e.g., BA, AB, BS)  Occupational History  . Occupation: Control and instrumentation engineer    Comment: disability  Social Needs  . Financial resource strain: Somewhat hard  . Food insecurity:    Worry: Never true    Inability: Never true  . Transportation needs:    Medical: No    Non-medical: No  Tobacco Use  . Smoking status: Never Smoker  . Smokeless tobacco: Never Used  Substance and Sexual Activity  . Alcohol use: No    Alcohol/week: 0.0 oz  . Drug use: No  . Sexual activity: Never  Lifestyle  . Physical activity:    Days per week: Not on file    Minutes per session: Not on file  . Stress: Very much    Relationships  . Social connections:    Talks on phone: Not on file    Gets together: Not on file    Attends religious service: Not on file    Active member of club or organization: Not on file    Attends meetings of clubs or organizations: Not on file    Relationship status: Not on file  Other Topics Concern  . Not on file  Social History Narrative  . Not on file    Outpatient Encounter Medications as of 10/31/2017  Medication Sig  . amLODipine (NORVASC) 10 MG tablet Take 1 tablet (10 mg total) by mouth daily.  Marland Kitchen aspirin 81 MG tablet Take 81 mg by mouth daily.  Marland Kitchen atorvastatin (LIPITOR) 20 MG tablet Take 1 tablet (20 mg total) by mouth daily.  Marland Kitchen gabapentin (NEURONTIN) 300 MG capsule Take 300 mg by mouth 3 (three) times daily. Reported on 11/27/2015  . glucose blood test strip Test fasting blood sugar before breakfast daily. Recheck blood sugar if any symptoms of hypoglycemia.  Marland Kitchen ibuprofen (ADVIL,MOTRIN) 200 MG tablet Take 200 mg by mouth every 6 (six) hours as needed (patient takes 600 mg at night.). Reported on 09/14/2015  . metFORMIN (GLUCOPHAGE) 500 MG tablet Take 1 tablet (500 mg total) by mouth 2 (two) times daily with a meal.  . metoprolol tartrate (LOPRESSOR) 25 MG tablet Take 1 tablet (25 mg total) by mouth 2 (two) times daily.  . ondansetron (ZOFRAN ODT) 8 MG disintegrating tablet Take 1 tablet (8 mg total) by mouth every 8 (eight) hours as needed for nausea or vomiting.  Glory Rosebush DELICA LANCETS FINE MISC Test fasting blood sugar each morning.  . tapentadol (NUCYNTA) 50 MG TABS tablet Take 50 mg by mouth every 6 (six) hours as needed. Reported on 11/27/2015  . lidocaine (LIDODERM) 5 % APPLY 1 PATCH 12 HOURS ON AND 12 HOURS OFF (Patient not taking: Reported on 10/31/2017)   Facility-Administered Encounter Medications as of 10/31/2017  Medication  . bupivacaine (PF) (MARCAINE) 0.25 % injection 30 mL  . ceFAZolin (ANCEF) IVPB 1 g/50 mL premix  . lactated ringers infusion 1,000 mL   . lactated ringers infusion 1,000 mL  . lactated ringers infusion 1,000 mL  . lactated ringers infusion 1,000 mL  . lidocaine (PF) (XYLOCAINE) 1 % injection 10 mL  . sodium chloride flush (NS) 0.9 % injection 20 mL  . triamcinolone acetonide (KENALOG-40) injection 40 mg    Activities of Daily Living In your present  state of health, do you have any difficulty performing the following activities: 10/31/2017  Hearing? N  Vision? Y  Comment Needs an eye exam and glasses.   Difficulty concentrating or making decisions? N  Walking or climbing stairs? Y  Comment Due left hip pain.   Dressing or bathing? N  Doing errands, shopping? N  Preparing Food and eating ? N  Using the Toilet? N  In the past six months, have you accidently leaked urine? Y  Comment Due to reoccuring UTIs.   Do you have problems with loss of bowel control? N  Managing your Medications? N  Managing your Finances? N  Housekeeping or managing your Housekeeping? N  Some recent data might be hidden    Patient Care Team: Chrismon, [name], PA as PCP - General (Physician Assistant)    Assessment:   This is a routine wellness examination for Ashiya.  Exercise Activities and Dietary recommendations Current Exercise Habits: The patient does not participate in regular exercise at present, Exercise limited by: orthopedic condition(s)  Goals    . Exercise 2x per week (45 min per time)     Recommend increasing exercise weekly. Pt to start physical therapy 2 times a week for 45 minutes.        Fall Risk Fall Risk  10/31/2017 10/24/2016 01/22/2016 01/08/2016 11/27/2015  Falls in the past year? Yes No No No No  Number falls in past yr: 1 - - - -  Comment hurt leg (was told bone has worn off the hip) - - - -  Injury with Fall? Yes - - - -  Comment Needs sx  - - - -  Follow up Falls prevention discussed - - - -   Is the patient's home free of loose throw rugs in walkways, pet beds, electrical cords, etc?   yes      Grab  bars in the bathroom? no      Handrails on the stairs?   yes      Adequate lighting?   yes  Timed Get Up and Go performed: N/A  Depression Screen PHQ 2/9 Scores 10/31/2017 10/24/2016 01/08/2016 11/27/2015  PHQ - 2 Score 3 0 0 0  PHQ- 9 Score 7 - - -     Cognitive Function: Pt declined screening today.      6CIT Screen 10/24/2016  What Year? 0 points  What month? 0 points  What time? 0 points  Count back from 20 0 points  Months in reverse 0 points  Repeat phrase 6 points  Total Score 6    Immunization History  Administered Date(s) Administered  . Influenza Split 05/26/2007  . Influenza,inj,Quad PF,6+ Mos 05/31/2016, 05/19/2017    Qualifies for Shingles Vaccine? Due for Shingles vaccine. Declined my offer to administer today. Education has been provided regarding the importance of this vaccine. Pt has been advised to call her insurance company to determine her out of pocket expense. Advised she may also receive this vaccine at her local pharmacy or Health Dept. Verbalized acceptance and understanding.  Screening Tests Health Maintenance  Topic Date Due  . OPHTHALMOLOGY EXAM  11/23/1969  . URINE MICROALBUMIN  11/23/1969  . TETANUS/TDAP  11/24/1978  . PAP SMEAR  11/23/1980  . MAMMOGRAM  11/23/2009  . COLONOSCOPY  11/23/2009  . PNEUMOCOCCAL POLYSACCHARIDE VACCINE (1) 11/23/2024 (Originally 11/23/1961)  . INFLUENZA VACCINE  12/11/2017  . HEMOGLOBIN A1C  04/05/2018  . FOOT EXAM  08/02/2018  . Hepatitis C Screening  Completed  .  HIV Screening  Completed    Cancer Screenings: Lung: Low Dose CT Chest recommended if Age 64-80 years, 30 pack-year currently smoking OR have quit w/in 15years. Patient does not qualify. Breast:  Up to date on Mammogram? No, order still on file. Pt to set up apt this year.  Up to date of Bone Density/Dexa? N/A Colorectal: Referral sent today.  Additional Screenings:  Hepatitis C Screening: Up to date     Plan:  I have personally reviewed and  addressed the Medicare Annual Wellness questionnaire and have noted the following in the patient's chart:  A. Medical and social history B. Use of alcohol, tobacco or illicit drugs  C. Current medications and supplements D. Functional ability and status E.  Nutritional status F.  Physical activity G. Advance directives H. List of other physicians I.  Hospitalizations, surgeries, and ER visits in previous 12 months J.  [place] such as hearing and vision if needed, cognitive and depression L. Referrals and appointments - none  In addition, I have reviewed and discussed with patient certain preventive protocols, quality metrics, and best practice recommendations. A written personalized care plan for preventive services as well as general preventive health recommendations were provided to patient.  See attached scanned questionnaire for additional information.   Signed,  Fabio Neighbors, LPN Nurse Health Advisor   Nurse Recommendations: Pt plans to set up an eye exam and mammogram apt this year. Pt needs a pap smear and her urine microalbumin checked at next physical apt. Pt declined the tetanus vaccine today. Colonoscopy referral sent.  Reviewed documentation of screening by Nurse Health Advisor. Agree with recommendations and was available for consultation during interview.

## 2017-10-31 NOTE — Patient Instructions (Addendum)
Emily Stokes , Thank you for taking time to come for your Medicare Wellness Visit. I appreciate your ongoing commitment to your health goals. Please review the following plan we discussed and let me know if I can assist you in the future.   Screening recommendations/referrals: Colonoscopy: Referral sent today. Mammogram: Pt to set up apt this year.  Bone Density: N/A Recommended yearly ophthalmology/optometry visit for glaucoma screening and checkup Recommended yearly dental visit for hygiene and checkup  Vaccinations: Influenza vaccine: Up to date Pneumococcal vaccine: N/A Tdap vaccine: Pt declines today.  Shingles vaccine: N/A    Advanced directives: Advance directive discussed with you today. Even though you declined this today please call our office should you change your mind and we can give you the proper paperwork for you to fill out.  Conditions/risks identified: Obesity- recommend to start eating 3 meals a day with 2 healthy snacks in between.  Next appointment: 01/30/18 @ 3:40 PM with Emily Stokes.  Preventive Care 40-64 Years, Female Preventive care refers to lifestyle choices and visits with your health care provider that can promote health and wellness. What does preventive care include?  A yearly physical exam. This is also called an annual well check.  Dental exams once or twice a year.  Routine eye exams. Ask your health care provider how often you should have your eyes checked.  Personal lifestyle choices, including:  Daily care of your teeth and gums.  Regular physical activity.  Eating a healthy diet.  Avoiding tobacco and drug use.  Limiting alcohol use.  Practicing safe sex.  Taking low-dose aspirin daily starting at age 35.  Taking vitamin and mineral supplements as recommended by your health care provider. What happens during an annual well check? The services and screenings done by your health care provider during your annual well check will  depend on your age, overall health, lifestyle risk factors, and family history of disease. Counseling  Your health care provider may ask you questions about your:  Alcohol use.  Tobacco use.  Drug use.  Emotional well-being.  Home and relationship well-being.  Sexual activity.  Eating habits.  Work and work Statistician.  Method of birth control.  Menstrual cycle.  Pregnancy history. Screening  You may have the following tests or measurements:  Height, weight, and BMI.  Blood pressure.  Lipid and cholesterol levels. These may be checked every 5 years, or more frequently if you are over 8 years old.  Skin check.  Lung cancer screening. You may have this screening every year starting at age 73 if you have a 30-pack-year history of smoking and currently smoke or have quit within the past 15 years.  Fecal occult blood test (FOBT) of the stool. You may have this test every year starting at age 1.  Flexible sigmoidoscopy or colonoscopy. You may have a sigmoidoscopy every 5 years or a colonoscopy every 10 years starting at age 54.  Hepatitis C blood test.  Hepatitis B blood test.  Sexually transmitted disease (STD) testing.  Diabetes screening. This is done by checking your blood sugar (glucose) after you have not eaten for a while (fasting). You may have this done every 1-3 years.  Mammogram. This may be done every 1-2 years. Talk to your health care provider about when you should start having regular mammograms. This may depend on whether you have a family history of breast cancer.  BRCA-related cancer screening. This may be done if you have a family history of breast, ovarian, tubal,  or peritoneal cancers.  Pelvic exam and Pap test. This may be done every 3 years starting at age 49. Starting at age 33, this may be done every 5 years if you have a Pap test in combination with an HPV test.  Bone density scan. This is done to screen for osteoporosis. You may have  this scan if you are at high risk for osteoporosis. Discuss your test results, treatment options, and if necessary, the need for more tests with your health care provider. Vaccines  Your health care provider may recommend certain vaccines, such as:  Influenza vaccine. This is recommended every year.  Tetanus, diphtheria, and acellular pertussis (Tdap, Td) vaccine. You may need a Td booster every 10 years.  Zoster vaccine. You may need this after age 79.  Pneumococcal 13-valent conjugate (PCV13) vaccine. You may need this if you have certain conditions and were not previously vaccinated.  Pneumococcal polysaccharide (PPSV23) vaccine. You may need one or two doses if you smoke cigarettes or if you have certain conditions. Talk to your health care provider about which screenings and vaccines you need and how often you need them. This information is not intended to replace advice given to you by your health care provider. Make sure you discuss any questions you have with your health care provider. Document Released: 05/26/2015 Document Revised: 01/17/2016 Document Reviewed: 02/28/2015 Elsevier Interactive Patient Education  2017 [employer] Prevention in the Home Falls can cause injuries. They can happen to people of all ages. There are many things you can do to make your home safe and to help prevent falls. What can I do on the outside of my home?  Regularly fix the edges of walkways and driveways and fix any cracks.  Remove anything that might make you trip as you walk through a door, such as a raised step or threshold.  Trim any bushes or trees on the path to your home.  Use bright outdoor lighting.  Clear any walking paths of anything that might make someone trip, such as rocks or tools.  Regularly check to see if handrails are loose or broken. Make sure that both sides of any steps have handrails.  Any raised decks and porches should have guardrails on the  edges.  Have any leaves, snow, or ice cleared regularly.  Use sand or salt on walking paths during winter.  Clean up any spills in your garage right away. This includes oil or grease spills. What can I do in the bathroom?  Use night lights.  Install grab bars by the toilet and in the tub and shower. Do not use towel bars as grab bars.  Use non-skid mats or decals in the tub or shower.  If you need to sit down in the shower, use a plastic, non-slip stool.  Keep the floor dry. Clean up any water that spills on the floor as soon as it happens.  Remove soap buildup in the tub or shower regularly.  Attach bath mats securely with double-sided non-slip rug tape.  Do not have throw rugs and other things on the floor that can make you trip. What can I do in the bedroom?  Use night lights.  Make sure that you have a light by your bed that is easy to reach.  Do not use any sheets or blankets that are too big for your bed. They should not hang down onto the floor.  Have a firm chair that has side arms. You  can use this for support while you get dressed.  Do not have throw rugs and other things on the floor that can make you trip. What can I do in the kitchen?  Clean up any spills right away.  Avoid walking on wet floors.  Keep items that you use a lot in easy-to-reach places.  If you need to reach something above you, use a strong step stool that has a grab bar.  Keep electrical cords out of the way.  Do not use floor polish or wax that makes floors slippery. If you must use wax, use non-skid floor wax.  Do not have throw rugs and other things on the floor that can make you trip. What can I do with my stairs?  Do not leave any items on the stairs.  Make sure that there are handrails on both sides of the stairs and use them. Fix handrails that are broken or loose. Make sure that handrails are as long as the stairways.  Check any carpeting to make sure that it is firmly  attached to the stairs. Fix any carpet that is loose or worn.  Avoid having throw rugs at the top or bottom of the stairs. If you do have throw rugs, attach them to the floor with carpet tape.  Make sure that you have a light switch at the top of the stairs and the bottom of the stairs. If you do not have them, ask someone to add them for you. What else can I do to help prevent falls?  Wear shoes that:  Do not have high heels.  Have rubber bottoms.  Are comfortable and fit you well.  Are closed at the toe. Do not wear sandals.  If you use a stepladder:  Make sure that it is fully opened. Do not climb a closed stepladder.  Make sure that both sides of the stepladder are locked into place.  Ask someone to hold it for you, if possible.  Clearly mark and make sure that you can see:  Any grab bars or handrails.  First and last steps.  Where the edge of each step is.  Use tools that help you move around (mobility aids) if they are needed. These include:  Canes.  Walkers.  Scooters.  Crutches.  Turn on the lights when you go into a dark area. Replace any light bulbs as soon as they burn out.  Set up your furniture so you have a clear path. Avoid moving your furniture around.  If any of your floors are uneven, fix them.  If there are any pets around you, be aware of where they are.  Review your medicines with your doctor. Some medicines can make you feel dizzy. This can increase your chance of falling. Ask your doctor what other things that you can do to help prevent falls. This information is not intended to replace advice given to you by your health care provider. Make sure you discuss any questions you have with your health care provider. Document Released: 02/23/2009 Document Revised: 10/05/2015 Document Reviewed: 06/03/2014 Elsevier Interactive Patient Education  2017 [employer].

## 2017-10-31 NOTE — Patient Instructions (Signed)
We will call you with the urine culture result. 

## 2017-10-31 NOTE — Progress Notes (Signed)
  Subjective:     Patient ID: Emily Stokes, female   DOB: 09-Oct-1960, 57 y.o.   MRN: [id number] Chief Complaint  Patient presents with  . Urinary Frequency    Patient comes in office today with complaints of urinary frequency and urgency for the past 2 weeks. Patient denies fever, nausea, back pain, dysuria, incontience or flank pain.    HPI Last urine culture 08/22/17 with pan-sensitive E. Coli.  Review of Systems     Objective:   Physical Exam  Constitutional: She appears well-developed and well-nourished. No distress.  Genitourinary:  Genitourinary Comments: No CVA tenderness       Assessment:    1. Urinary frequency - Urine Culture - POCT urinalysis dipstick  2. Cystitis - sulfamethoxazole-trimethoprim (BACTRIM DS,SEPTRA DS) 800-160 MG tablet; Take 1 tablet by mouth 2 (two) times daily.  Dispense: 14 tablet; Refill: 0    Plan:    Further f/u pending urine culture results.

## 2017-11-02 LAB — URINE CULTURE

## 2017-11-03 ENCOUNTER — Telehealth: Payer: Self-pay

## 2017-11-03 NOTE — Telephone Encounter (Signed)
Patient advised.KW 

## 2017-11-03 NOTE — Telephone Encounter (Signed)
-----   Message from Carmon Ginsberg, [place] sent at 11/03/2017  7:19 AM EDT ----- No specific organism detected. May stop antibiotics if feeling better.

## 2017-11-05 ENCOUNTER — Telehealth: Payer: Self-pay | Admitting: Family Medicine

## 2017-11-05 NOTE — Telephone Encounter (Signed)
Pt called wanting to know the status of her referral to the pain Clinic.  I told her we tried to reach out to her but could not get in touch.  She said to call her at [phone]  Thanks teri

## 2017-11-05 NOTE — Telephone Encounter (Signed)
Patient advised Dr. Elwyn Lade office attempted to contact her on 10/15/17, Patient will contact Dr. Elwyn Lade office to see about scheduling a appointment.

## 2017-11-26 ENCOUNTER — Telehealth: Payer: Self-pay | Admitting: Gastroenterology

## 2017-11-26 NOTE — Telephone Encounter (Signed)
Gastroenterology Pre-Procedure Review  Request Date: 01/09/18   [facility] Requesting Physician: Dr. Bonna Gains  PATIENT REVIEW QUESTIONS: The patient responded to the following health history questions as indicated:    1. Are you having any GI issues? no 2. Do you have a personal history of Polyps? no 3. Do you have a family history of Colon Cancer or Polyps? no 4. Diabetes Mellitus? yes (Type II) 5. Joint replacements in the past 12 months?no 6. Major health problems in the past 3 months?no 7. Any artificial heart valves, MVP, or defibrillator?no    MEDICATIONS & ALLERGIES:    Patient reports the following regarding taking any anticoagulation/antiplatelet therapy:   Plavix, Coumadin, Eliquis, Xarelto, Lovenox, Pradaxa, Brilinta, or Effient? no Aspirin? yes (81 mg)  Patient confirms/reports the following medications:  Current Outpatient Medications  Medication Sig Dispense Refill  . amLODipine (NORVASC) 10 MG tablet Take 1 tablet (10 mg total) by mouth daily. 90 tablet 3  . aspirin 81 MG tablet Take 81 mg by mouth daily.    Marland Kitchen atorvastatin (LIPITOR) 20 MG tablet Take 1 tablet (20 mg total) by mouth daily. 90 tablet 0  . gabapentin (NEURONTIN) 300 MG capsule Take 300 mg by mouth 3 (three) times daily. Reported on 11/27/2015    . glucose blood test strip Test fasting blood sugar before breakfast daily. Recheck blood sugar if any symptoms of hypoglycemia. 100 each 4  . ibuprofen (ADVIL,MOTRIN) 200 MG tablet Take 200 mg by mouth every 6 (six) hours as needed (patient takes 600 mg at night.). Reported on 09/14/2015    . lidocaine (LIDODERM) 5 % APPLY 1 PATCH 12 HOURS ON AND 12 HOURS OFF 90 patch 0  . metFORMIN (GLUCOPHAGE) 500 MG tablet Take 1 tablet (500 mg total) by mouth 2 (two) times daily with a meal. 180 tablet 3  . metoprolol tartrate (LOPRESSOR) 25 MG tablet Take 1 tablet (25 mg total) by mouth 2 (two) times daily. 180 tablet 3  . ondansetron (ZOFRAN ODT) 8 MG disintegrating tablet Take 1  tablet (8 mg total) by mouth every 8 (eight) hours as needed for nausea or vomiting. 20 tablet 0  . ONETOUCH DELICA LANCETS FINE MISC Test fasting blood sugar each morning. 100 each 4  . sulfamethoxazole-trimethoprim (BACTRIM DS,SEPTRA DS) 800-160 MG tablet Take 1 tablet by mouth 2 (two) times daily. 14 tablet 0  . tapentadol (NUCYNTA) 50 MG TABS tablet Take 50 mg by mouth every 6 (six) hours as needed. Reported on 11/27/2015     Current Facility-Administered Medications  Medication Dose Route Frequency Provider Last Rate Last Dose  . bupivacaine (PF) (MARCAINE) 0.25 % injection 30 mL  30 mL Other Once Mohammed Kindle, MD      . ceFAZolin (ANCEF) IVPB 1 g/50 mL premix  1 g Intravenous Once Mohammed Kindle, MD      . lactated ringers infusion 1,000 mL  1,000 mL Intravenous Continuous Mohammed Kindle, MD      . lactated ringers infusion 1,000 mL  1,000 mL Intravenous Continuous Mohammed Kindle, MD 125 mL/hr at 11/06/15 1200 1,000 mL at 11/06/15 1200  . lactated ringers infusion 1,000 mL  1,000 mL Intravenous Continuous Mohammed Kindle, MD 125 mL/hr at 12/04/15 1243 1,000 mL at 12/04/15 1243  . lactated ringers infusion 1,000 mL  1,000 mL Intravenous Continuous Mohammed Kindle, MD      . lidocaine (PF) (XYLOCAINE) 1 % injection 10 mL  10 mL Subcutaneous Once Mohammed Kindle, MD      . sodium chloride  flush (NS) 0.9 % injection 20 mL  20 mL Other Once Mohammed Kindle, MD      . triamcinolone acetonide (KENALOG-40) injection 40 mg  40 mg Other Once Mohammed Kindle, MD        Patient confirms/reports the following allergies:  No Known Allergies  No orders of the defined types were placed in this encounter.   AUTHORIZATION INFORMATION Primary Insurance: 1D#: Group #:  Secondary Insurance: 1D#: Group #:  SCHEDULE INFORMATION: Date: 01/09/18 [place] Time: Location:[facility]

## 2017-11-28 ENCOUNTER — Other Ambulatory Visit: Payer: Self-pay

## 2017-11-28 DIAGNOSIS — Z1211 Encounter for screening for malignant neoplasm of colon: Secondary | ICD-10-CM

## 2017-12-01 ENCOUNTER — Other Ambulatory Visit: Payer: Self-pay

## 2017-12-08 ENCOUNTER — Other Ambulatory Visit: Payer: Self-pay | Admitting: Family Medicine

## 2017-12-23 ENCOUNTER — Encounter: Payer: Self-pay | Admitting: Student in an Organized Health Care Education/Training Program

## 2017-12-23 ENCOUNTER — Ambulatory Visit
Payer: Medicare Other | Attending: Student in an Organized Health Care Education/Training Program | Admitting: Student in an Organized Health Care Education/Training Program

## 2017-12-23 VITALS — BP 128/70 | HR 67 | Temp 98.6°F | Resp 16 | Ht 69.0 in | Wt 240.0 lb

## 2017-12-23 DIAGNOSIS — Z8249 Family history of ischemic heart disease and other diseases of the circulatory system: Secondary | ICD-10-CM | POA: Insufficient documentation

## 2017-12-23 DIAGNOSIS — R002 Palpitations: Secondary | ICD-10-CM | POA: Diagnosis not present

## 2017-12-23 DIAGNOSIS — Z7984 Long term (current) use of oral hypoglycemic drugs: Secondary | ICD-10-CM | POA: Insufficient documentation

## 2017-12-23 DIAGNOSIS — M5136 Other intervertebral disc degeneration, lumbar region: Secondary | ICD-10-CM | POA: Diagnosis not present

## 2017-12-23 DIAGNOSIS — N200 Calculus of kidney: Secondary | ICD-10-CM | POA: Diagnosis not present

## 2017-12-23 DIAGNOSIS — I10 Essential (primary) hypertension: Secondary | ICD-10-CM | POA: Diagnosis not present

## 2017-12-23 DIAGNOSIS — M47816 Spondylosis without myelopathy or radiculopathy, lumbar region: Secondary | ICD-10-CM | POA: Diagnosis not present

## 2017-12-23 DIAGNOSIS — E7800 Pure hypercholesterolemia, unspecified: Secondary | ICD-10-CM | POA: Insufficient documentation

## 2017-12-23 DIAGNOSIS — Z7982 Long term (current) use of aspirin: Secondary | ICD-10-CM | POA: Insufficient documentation

## 2017-12-23 DIAGNOSIS — M5116 Intervertebral disc disorders with radiculopathy, lumbar region: Secondary | ICD-10-CM | POA: Insufficient documentation

## 2017-12-23 DIAGNOSIS — M5416 Radiculopathy, lumbar region: Secondary | ICD-10-CM | POA: Diagnosis not present

## 2017-12-23 DIAGNOSIS — G5600 Carpal tunnel syndrome, unspecified upper limb: Secondary | ICD-10-CM | POA: Insufficient documentation

## 2017-12-23 DIAGNOSIS — E669 Obesity, unspecified: Secondary | ICD-10-CM | POA: Insufficient documentation

## 2017-12-23 DIAGNOSIS — R0683 Snoring: Secondary | ICD-10-CM | POA: Diagnosis not present

## 2017-12-23 DIAGNOSIS — M47896 Other spondylosis, lumbar region: Secondary | ICD-10-CM | POA: Diagnosis not present

## 2017-12-23 DIAGNOSIS — M25552 Pain in left hip: Secondary | ICD-10-CM | POA: Diagnosis not present

## 2017-12-23 DIAGNOSIS — Z833 Family history of diabetes mellitus: Secondary | ICD-10-CM | POA: Insufficient documentation

## 2017-12-23 DIAGNOSIS — Z79899 Other long term (current) drug therapy: Secondary | ICD-10-CM | POA: Insufficient documentation

## 2017-12-23 DIAGNOSIS — E1140 Type 2 diabetes mellitus with diabetic neuropathy, unspecified: Secondary | ICD-10-CM | POA: Diagnosis not present

## 2017-12-23 DIAGNOSIS — Z6835 Body mass index (BMI) 35.0-35.9, adult: Secondary | ICD-10-CM | POA: Insufficient documentation

## 2017-12-23 DIAGNOSIS — G894 Chronic pain syndrome: Secondary | ICD-10-CM | POA: Diagnosis not present

## 2017-12-23 DIAGNOSIS — M79605 Pain in left leg: Secondary | ICD-10-CM | POA: Insufficient documentation

## 2017-12-23 DIAGNOSIS — M25511 Pain in right shoulder: Secondary | ICD-10-CM | POA: Diagnosis not present

## 2017-12-23 DIAGNOSIS — M79601 Pain in right arm: Secondary | ICD-10-CM | POA: Diagnosis not present

## 2017-12-23 DIAGNOSIS — M1288 Other specific arthropathies, not elsewhere classified, other specified site: Secondary | ICD-10-CM | POA: Insufficient documentation

## 2017-12-23 DIAGNOSIS — D509 Iron deficiency anemia, unspecified: Secondary | ICD-10-CM | POA: Insufficient documentation

## 2017-12-23 DIAGNOSIS — Z823 Family history of stroke: Secondary | ICD-10-CM | POA: Diagnosis not present

## 2017-12-23 DIAGNOSIS — Z90711 Acquired absence of uterus with remaining cervical stump: Secondary | ICD-10-CM | POA: Diagnosis not present

## 2017-12-23 MED ORDER — PREGABALIN 50 MG PO CAPS: 50.0000 mg | ORAL_CAPSULE | Freq: Three times a day (TID) | ORAL | 1 refills | Status: DC

## 2017-12-23 MED ORDER — DICLOFENAC SODIUM 75 MG PO TBEC: 75.0000 mg | DELAYED_RELEASE_TABLET | Freq: Two times a day (BID) | ORAL | 0 refills | Status: DC

## 2017-12-23 MED ORDER — PREGABALIN 50 MG PO CAPS: 50.0000 mg | ORAL_CAPSULE | Freq: Three times a day (TID) | ORAL | 0 refills | Status: DC

## 2017-12-23 NOTE — Progress Notes (Signed)
And on the left that radiates down her left lateral hip and left lateral thigh stopping at her knee.Patient's Name: Emily Stokes  MRN: [id number]  Referring Provider: Margo Common, PA  DOB: 05/27/1960  PCP: Margo Common, PA  DOS: 12/23/2017  Note by: Gillis Santa, MD  Service setting: Ambulatory outpatient  Specialty: Interventional Pain Management  Location: [facility] (AMB) Pain Management Facility  Visit type: Initial Patient Evaluation  Patient type: New Patient   Primary Reason(s) for Visit: Encounter for initial evaluation of one or more chronic problems (new to examiner) potentially causing chronic pain, and posing a threat to normal musculoskeletal function. (Level of risk: High) CC: Leg Pain (left ); Arm Pain (right); and Hip Pain (left )  HPI  Ms. Polivka is a 57 y.o. year old, female patient, who comes today to see [place] for the first time for an initial evaluation of her chronic pain. She has Palpitations; HTN (hypertension); Abnormal finding on EKG; Obesity; Anemia, iron deficiency; Calculus of kidney; History of chicken pox; Leg pain; Urinary system disease; Hypercholesteremia; Blood glucose elevated; Benign essential HTN; Infected surgical wound; Pain in soft tissues of limb; Nonspecific ST-T changes; Arthralgia of lower leg; Awareness of heartbeats; Pain in extremity at multiple sites; Snores; Adnexal mass; Pain in shoulder; Hernia of anterior abdominal wall; DDD (degenerative disc disease), lumbar; Facet syndrome, lumbar; Lumbar radiculopathy; Sacroiliac joint dysfunction; Cellulitis of face; Chronic pain syndrome; Nasal septal abscess; Pelvic cyst; Pain in elbow; Recurrent abdominal hernia without obstruction or gangrene; and Type 2 diabetes mellitus without complication, without long-term current use of insulin ([place]) on their problem list. Today she comes in for evaluation of her Leg Pain (left ); Arm Pain (right); and Hip Pain (left )  Pain Assessment: Location: Left  Hip(arm) Radiating: down the left leg  Onset: More than a month ago Duration: Chronic pain Quality: Heaviness, Discomfort, Numbness, Sharp, Constant(weakness) Severity: 8 /10 (subjective, self-reported pain score)  Note: Reported level is inconsistent with clinical observations.                         When using our objective Pain Scale, levels between 6 and 10/10 are said to belong in an emergency room, as it progressively worsens from a 6/10, described as severely limiting, requiring emergency care not usually available at an outpatient pain management facility. At a 6/10 level, communication becomes difficult and requires great effort. Assistance to reach the emergency department may be required. Facial flushing and profuse sweating along with potentially dangerous increases in heart rate and blood pressure will be evident. Effect on ADL: patient states the leg has hurt since she had surgery.  standing for long periods will cause the leg to give out  Timing: Constant Modifying factors: nothing currently BP: 128/70  HR: 67  Onset and Duration: Gradual Cause of pain: Unknown Severity: NAS-11 at its worse: 10/10, NAS-11 at its best: 6/10, NAS-11 now: 8/10 and NAS-11 on the average: 8/10 Timing: Not influenced by the time of the day Aggravating Factors: Kneeling, Prolonged sitting, Prolonged standing, Squatting, Stooping , Walking, Walking uphill and Walking downhill Alleviating Factors: Resting Associated Problems: Pain that does not allow patient to sleep Quality of Pain: Aching, Annoying, Deep, Nagging, Sharp and Stabbing Previous Examinations or Tests: CT scan and X-rays Previous Treatments: Physical Therapy  The patient comes into the clinics today for the first time for a chronic pain management evaluation.   57 year old female who presents with a chief  complaint of left axial low back pain that radiates down her left hip and her left thigh stopping at her knee.  Patient was  previously seeing Dr. Primus Bravo at this clinic and being evaluated and treated for lumbar degenerative disc disease, lumbar radiculopathy and lumbar facet disease.  Patient was previously on gabapentin but did not find great benefit with that.  Patient denies any bowel or bladder dysfunction.  She also is endorsing right-sided shoulder pain.  This is worse with shoulder abduction.  She is currently not on any opioid therapy.  Note: PMP checked and appropriate.  Today I took the time to provide the patient with information regarding my pain practice. The patient was informed that my practice is divided into two sections: an interventional pain management section, as well as a completely separate and distinct medication management section. I explained that I have procedure days for my interventional therapies, and evaluation days for follow-ups and medication management. Because of the amount of documentation required during both, they are kept separated. This means that there is the possibility that she may be scheduled for a procedure on one day, and medication management the next. I have also informed her that because of staffing and facility limitations, I no longer take patients for medication management only. To illustrate the reasons for this, I gave the patient the example of surgeons, and how inappropriate it would be to refer a patient to his/her care, just to write for the post-surgical antibiotics on a surgery done by a different surgeon.   Because interventional pain management is my board-certified specialty, the patient was informed that joining my practice means that they are open to any and all interventional therapies. I made it clear that this does not mean that they will be forced to have any procedures done. What this means is that I believe interventional therapies to be essential part of the diagnosis and proper management of chronic pain conditions. Therefore, patients not interested in these  interventional alternatives will be better served under the care of a different practitioner.  The patient was also made aware of my Comprehensive Pain Management Safety Guidelines where by joining my practice, they limit all of their nerve blocks and joint injections to those done by our practice, for as long as we are retained to manage their care.   Historic Controlled Substance Pharmacotherapy Review  Risk Assessment Profile: Aberrant behavior: None observed or detected today Risk factors for fatal opioid overdose: None identified today Fatal overdose hazard ratio (HR): Calculation deferred Non-fatal overdose hazard ratio (HR): Calculation deferred Risk of opioid abuse or dependence: 0.7-3.0% with doses ? 36 MME/day and 6.1-26% with doses ? 120 MME/day. Substance use disorder (SUD) risk level: See below Opioid risk tool (ORT) (Total Score):    ORT Scoring interpretation table:  Score <3 = Low Risk for SUD  Score between 4-7 = Moderate Risk for SUD  Score >8 = High Risk for Opioid Abuse   PHQ-2 Depression Scale:  Total score:    PHQ-2 Scoring interpretation table: (Score and probability of major depressive disorder)  Score 0 = No depression  Score 1 = 15.4% Probability  Score 2 = 21.1% Probability  Score 3 = 38.4% Probability  Score 4 = 45.5% Probability  Score 5 = 56.4% Probability  Score 6 = 78.6% Probability   PHQ-9 Depression Scale:  Total score:    PHQ-9 Scoring interpretation table:  Score 0-4 = No depression  Score 5-9 = Mild depression  Score 10-14 =  Moderate depression  Score 15-19 = Moderately severe depression  Score 20-27 = Severe depression (2.4 times higher risk of SUD and 2.89 times higher risk of overuse)   Pharmacologic Plan: As per protocol, I have not taken over any controlled substance management, pending the results of ordered tests and/or consults.            Initial impression: Pending review of available data and ordered tests.  Meds   Current  Outpatient Medications:  .  amLODipine (NORVASC) 10 MG tablet, Take 1 tablet (10 mg total) by mouth daily., Disp: 90 tablet, Rfl: 3 .  aspirin 81 MG tablet, Take 81 mg by mouth daily., Disp: , Rfl:  .  atorvastatin (LIPITOR) 20 MG tablet, TAKE 1 TABLET BY MOUTH  DAILY, Disp: 90 tablet, Rfl: 1 .  glucose blood test strip, Test fasting blood sugar before breakfast daily. Recheck blood sugar if any symptoms of hypoglycemia., Disp: 100 each, Rfl: 4 .  metFORMIN (GLUCOPHAGE) 500 MG tablet, Take 1 tablet (500 mg total) by mouth 2 (two) times daily with a meal., Disp: 180 tablet, Rfl: 3 .  metoprolol tartrate (LOPRESSOR) 25 MG tablet, Take 1 tablet (25 mg total) by mouth 2 (two) times daily., Disp: 180 tablet, Rfl: 3 .  ondansetron (ZOFRAN ODT) 8 MG disintegrating tablet, Take 1 tablet (8 mg total) by mouth every 8 (eight) hours as needed for nausea or vomiting., Disp: 20 tablet, Rfl: 0 .  ONETOUCH DELICA LANCETS FINE MISC, Test fasting blood sugar each morning., Disp: 100 each, Rfl: 4 .  diclofenac (VOLTAREN) 75 MG EC tablet, Take 1 tablet (75 mg total) by mouth 2 (two) times daily after a meal., Disp: 60 tablet, Rfl: 0 .  pregabalin (LYRICA) 50 MG capsule, Take 1 capsule (50 mg total) by mouth 3 (three) times daily., Disp: 90 capsule, Rfl: 1 .  tapentadol (NUCYNTA) 50 MG TABS tablet, Take 50 mg by mouth every 6 (six) hours as needed. Reported on 11/27/2015, Disp: , Rfl:   Current Facility-Administered Medications:  .  sodium chloride flush (NS) 0.9 % injection 20 mL, 20 mL, Other, Once, Mohammed Kindle, MD .  triamcinolone acetonide (KENALOG-40) injection 40 mg, 40 mg, Other, Once, Mohammed Kindle, MD  Imaging Review    Lumbosacral Imaging: Lumbar MR wo contrast:  Results for orders placed in visit on 06/23/15  MR Lumbar Spine Wo Contrast   Narrative CLINICAL DATA:  Chronic low back pain radiating into the left leg and foot. No known injury. Initial encounter.  EXAM: MRI LUMBAR SPINE WITHOUT  CONTRAST  TECHNIQUE: Multiplanar, multisequence MR imaging of the lumbar spine was performed. No intravenous contrast was administered.  COMPARISON:  None.  FINDINGS: Vertebral body height is maintained. There is some degenerative endplate signal change at L5-S1. 0.3 cm anterolisthesis L4 on L5 is due to facet arthropathy. The conus medullaris is normal in signal and position. Imaged intra-abdominal contents demonstrate a T2 hyperintense lesion in the left kidney most consistent with a cyst.  T11-12:  Negative.  T12-L1: Minimal disc bulge. The central canal and foramina are widely patent.  L1-2:  Mild facet degenerative disease.  Otherwise negative.  L2-3: Mild facet degenerative change.  Otherwise negative.  L3-4: Moderate facet arthropathy is present and there is a shallow disc bulge. There is mild marrow edema in the L3 and L4 pedicles consistent with stress change related to facet arthropathy. The central spinal canal and neural foramina remain widely patent.  L4-5: Bilateral facet degenerative change is present.  The disc is uncovered without bulging. The central canal and foramina remain widely patent.  L5-S1: The patient has a disc bulge eccentric to the left and there is some facet degenerative disease. There is subarticular zone narrowing on the left which could impact the descending left S1 root. The thecal sac and foramina are widely patent.  IMPRESSION: Disc bulge to the left and facet arthropathy at L5-S1 result in subarticular narrowing which could impact the descending left S1 root. Mild disc bulging at additional levels of the lumbar spine as described above without central canal stenosis.  Facet arthropathy appears worst at L3-4 where there is some marrow edema in the pedicles bilaterally at each level consistent with stress change related to facet degeneration.   Electronically Signed   By: Inge Rise M.D.   On: 07/13/2015 13:44     Knee-R  DG 4 views:  Results for orders placed during the hospital encounter of 11/11/10  DG Knee Complete 4 Views Right   Narrative *RADIOLOGY REPORT*  Clinical Data: Right knee pain and swelling.  No known injury.  RIGHT KNEE - COMPLETE 4+ VIEW  Comparison: None.  Findings: Small to moderate sized effusion.  Mild medial joint space narrowing.  Mild spur formation involving all three joint compartments.  IMPRESSION: Mild degenerative changes and small to moderate sized effusion.  Original Report Authenticated By: Gerald Stabs, M.D.    Complexity Note: Imaging results reviewed. Results shared with Ms. Moorefield, using [employer].                         ROS  Cardiovascular: No reported cardiovascular signs or symptoms such as High blood pressure, coronary artery disease, abnormal heart rate or rhythm, heart attack, blood thinner therapy or heart weakness and/or failure Pulmonary or Respiratory: No reported pulmonary signs or symptoms such as wheezing and difficulty taking a deep full breath (Asthma), difficulty blowing air out (Emphysema), coughing up mucus (Bronchitis), persistent dry cough, or temporary stoppage of breathing during sleep Neurological: No reported neurological signs or symptoms such as seizures, abnormal skin sensations, urinary and/or fecal incontinence, being born with an abnormal open spine and/or a tethered spinal cord Review of Past Neurological Studies: No results found for this or any previous visit. Psychological-Psychiatric: No reported psychological or psychiatric signs or symptoms such as difficulty sleeping, anxiety, depression, delusions or hallucinations (schizophrenial), mood swings (bipolar disorders) or suicidal ideations or attempts Gastrointestinal: Heartburn due to stomach pushing into lungs (Hiatal hernia) Genitourinary: Passing kidney stones Hematological: No reported hematological signs or symptoms such as prolonged bleeding, low or poor functioning  platelets, bruising or bleeding easily, hereditary bleeding problems, low energy levels due to low hemoglobin or being anemic Endocrine: High blood sugar requiring insulin (IDDM) Rheumatologic: Joint aches and or swelling due to excess weight (Osteoarthritis) and Rheumatoid arthritis Musculoskeletal: Negative for myasthenia gravis, muscular dystrophy, multiple sclerosis or malignant hyperthermia Work History: Retired and Disabled  Allergies  Ms. Ciano has No Known Allergies.  Laboratory Chemistry  Inflammation Markers (CRP: Acute Phase) (ESR: Chronic Phase) No results found for: CRP, ESRSEDRATE, LATICACIDVEN                       Rheumatology Markers No results found for: RF, ANA, LABURIC, URICUR, LYMEIGGIGMAB, LYMEABIGMQN, HLAB27                      Renal Function Markers Lab Results  Component Value Date  BUN 17 10/17/2017   CREATININE 1.07 (H) 10/17/2017   BCR 16 10/17/2017   GFRAA 67 10/17/2017   GFRNONAA 58 (L) 10/17/2017                             Hepatic Function Markers Lab Results  Component Value Date   AST 13 10/17/2017   ALT 13 10/17/2017   ALBUMIN 4.5 10/17/2017   ALKPHOS 104 10/17/2017   LIPASE 26 07/07/2017                        Electrolytes Lab Results  Component Value Date   NA 143 10/17/2017   K 3.9 10/17/2017   CL 107 (H) 10/17/2017   CALCIUM 9.6 10/17/2017                        Neuropathy Markers Lab Results  Component Value Date   HGBA1C 7.1 (A) 10/03/2017   HIV Non Reactive 10/24/2016                        Bone Pathology Markers No results found for: [place], [id number], [id number], [id number], 25OHVITD1, 25OHVITD2, 25OHVITD3, TESTOFREE, TESTOSTERONE                       Coagulation Parameters Lab Results  Component Value Date   PLT 337 10/17/2017                        Cardiovascular Markers Lab Results  Component Value Date   HGB 12.7 10/17/2017   HCT 36.7 10/17/2017                         CA Markers No results found  for: CEA, CA125, LABCA2                      Note: Lab results reviewed.  [place]  Drug: Ms. Miranda  reports that she does not use drugs. Alcohol:  reports that she does not drink alcohol. Tobacco:  reports that she has never smoked. She has never used smokeless tobacco. Medical:  has a past medical history of Abdominal pain, epigastric, Anemia, Backache, Bronchitis, Calculus, kidney, Carpal tunnel syndrome, Chest pain, Chicken pox, Circulatory disease, Disorder of kidney and ureter, Excess, menstruation, Hypercholesteremia, Hypertension, essential, benign, Infected postoperative seroma, Iron deficiency, Malaise and fatigue, Measles, MRSA (methicillin resistant staph aureus) culture positive, Nausea alone, Nonspecific abnormal electrocardiogram (ECG) (EKG), and Pain in joint, lower leg. Family: family history includes Diabetes in her mother, other, other, and other; Hypertension in her father, mother, other, other, and other; Stroke in her father.  Past Surgical History:  Procedure Laterality Date  . ABDOMINAL HYSTERECTOMY    . CESAREAN SECTION    . COLONOSCOPY  1997  . COLOSTOMY TAKEDOWN  2012  . Hartmann's procedure.    Marland Kitchen HERNIA REPAIR  [id number]   umbilical  . LITHOTRIPSY  [id number]   with complications, hospitalized due to these complications for 3 months  . mrsa     removal on nose  . PARTIAL HYSTERECTOMY  2009    vaginal hysterectomy, has both ovaries   Active Ambulatory Problems    Diagnosis Date Noted  . Palpitations 03/14/2011  . HTN (hypertension) 03/14/2011  . Abnormal finding on EKG 03/14/2011  . Obesity 03/14/2011  .  Anemia, iron deficiency 06/17/2007  . Calculus of kidney 05/26/2007  . History of chicken pox 02/02/2015  . Leg pain 02/02/2015  . Urinary system disease 08/10/2008  . Hypercholesteremia 02/02/2015  . Blood glucose elevated 02/02/2015  . Benign essential HTN 07/12/2008  . Infected surgical wound 09/15/2008  . Pain in soft tissues of limb 02/02/2015  .  Nonspecific ST-T changes 02/02/2015  . Arthralgia of lower leg 03/28/2008  . Awareness of heartbeats 02/02/2015  . Pain in extremity at multiple sites 02/02/2015  . Snores 02/02/2015  . Adnexal mass 10/30/2011  . Pain in shoulder 12/05/2011  . Hernia of anterior abdominal wall 02/24/2012  . DDD (degenerative disc disease), lumbar 09/14/2015  . Facet syndrome, lumbar 09/14/2015  . Lumbar radiculopathy 09/14/2015  . Sacroiliac joint dysfunction 09/14/2015  . Cellulitis of face 05/30/2016  . Chronic pain syndrome 05/30/2016  . Nasal septal abscess 05/30/2016  . Pelvic cyst 10/30/2011  . Pain in elbow 02/05/2016  . Recurrent abdominal hernia without obstruction or gangrene 07/24/2017  . Type 2 diabetes mellitus without complication, without long-term current use of insulin ([place]) 07/31/2017   Resolved Ambulatory Problems    Diagnosis Date Noted  . No Resolved Ambulatory Problems   Past Medical History:  Diagnosis Date  . Abdominal pain, epigastric   . Anemia   . Backache   . Bronchitis   . Calculus, kidney   . Carpal tunnel syndrome   . Chest pain   . Chicken pox   . Circulatory disease   . Disorder of kidney and ureter   . Excess, menstruation   . Hypertension, essential, benign   . Infected postoperative seroma   . Iron deficiency   . Malaise and fatigue   . Measles   . MRSA (methicillin resistant staph aureus) culture positive   . Nausea alone   . Nonspecific abnormal electrocardiogram (ECG) (EKG)   . Pain in joint, lower leg    Constitutional Exam  General appearance: Well nourished, well developed, and well hydrated. In no apparent acute distress Vitals:   12/23/17 1320  BP: 128/70  Pulse: 67  Resp: 16  Temp: 98.6 F (37 C)  TempSrc: Oral  Weight: 240 lb (108.9 kg)  Height: '5\' 9"'$  (1.753 m)   BMI Assessment: Estimated body mass index is 35.44 kg/m as calculated from the following:   Height as of this encounter: '5\' 9"'$  (1.753 m).   Weight as of this  encounter: 240 lb (108.9 kg).  BMI interpretation table: BMI level Category Range association with higher incidence of chronic pain  <18 kg/m2 Underweight   18.5-24.9 kg/m2 Ideal body weight   25-29.9 kg/m2 Overweight Increased incidence by 20%  30-34.9 kg/m2 Obese (Class I) Increased incidence by 68%  35-39.9 kg/m2 Severe obesity (Class II) Increased incidence by 136%  >40 kg/m2 Extreme obesity (Class III) Increased incidence by 254%   Patient's current BMI Ideal Body weight  Body mass index is 35.44 kg/m. Ideal body weight: 66.2 kg (145 lb 15.1 oz) Adjusted ideal body weight: 83.3 kg (183 lb 9.1 oz)   BMI Readings from Last 4 Encounters:  12/23/17 35.44 kg/m  10/31/17 35.85 kg/m  10/31/17 35.85 kg/m  10/03/17 35.28 kg/m   Wt Readings from Last 4 Encounters:  12/23/17 240 lb (108.9 kg)  10/31/17 235 lb 12.8 oz (107 kg)  10/31/17 235 lb 12.8 oz (107 kg)  10/03/17 232 lb (105.2 kg)  Psych/Mental status: Alert, oriented x 3 (person, place, & time)  Eyes: PERLA Respiratory: No evidence of acute respiratory distress  Cervical Spine Area Exam  Skin & Axial Inspection: No masses, redness, edema, swelling, or associated skin lesions Alignment: Symmetrical Functional ROM: Unrestricted ROM      Stability: No instability detected Muscle Tone/Strength: Functionally intact. No obvious neuro-muscular anomalies detected. Sensory (Neurological): Unimpaired Palpation: No palpable anomalies              Upper Extremity (UE) Exam    Side: Right upper extremity  Side: Left upper extremity  Skin & Extremity Inspection: Skin color, temperature, and hair growth are WNL. No peripheral edema or cyanosis. No masses, redness, swelling, asymmetry, or associated skin lesions. No contractures.  Skin & Extremity Inspection: Skin color, temperature, and hair growth are WNL. No peripheral edema or cyanosis. No masses, redness, swelling, asymmetry, or associated skin lesions. No contractures.   Functional ROM: Decreased ROM for shoulder  Functional ROM: Unrestricted ROM          Muscle Tone/Strength: Functionally intact. No obvious neuro-muscular anomalies detected.  Muscle Tone/Strength: Functionally intact. No obvious neuro-muscular anomalies detected.  Sensory (Neurological): Arthropathic arthralgia          Sensory (Neurological): Unimpaired          Palpation: No palpable anomalies              Palpation: No palpable anomalies              Provocative Test(s):  Phalen's test: deferred Tinel's test: deferred Apley's scratch test (touch opposite shoulder):  Action 1 (Across chest): Decreased ROM Action 2 (Overhead): Decreased ROM Action 3 (LB reach): Decreased ROM   Provocative Test(s):  Phalen's test: deferred Tinel's test: deferred Apley's scratch test (touch opposite shoulder):  Action 1 (Across chest): Decreased ROM Action 2 (Overhead): Decreased ROM Action 3 (LB reach): Decreased ROM    Thoracic Spine Area Exam  Skin & Axial Inspection: No masses, redness, or swelling Alignment: Symmetrical Functional ROM: Unrestricted ROM Stability: No instability detected Muscle Tone/Strength: Functionally intact. No obvious neuro-muscular anomalies detected. Sensory (Neurological): Unimpaired Muscle strength & Tone: No palpable anomalies  Lumbar Spine Area Exam  Skin & Axial Inspection: No masses, redness, or swelling Alignment: Symmetrical Functional ROM: Decreased ROM       Stability: No instability detected Muscle Tone/Strength: Functionally intact. No obvious neuro-muscular anomalies detected. Sensory (Neurological): Unimpaired Palpation: No palpable anomalies       Provocative Tests: Hyperextension/rotation test: deferred today       Lumbar quadrant test (Kemp's test): deferred today       Lateral bending test: deferred today       Patrick's Maneuver: deferred today                   FABER test: deferred today                   S-I anterior  distraction/compression test: deferred today         S-I lateral compression test: deferred today         S-I Thigh-thrust test: deferred today         S-I Gaenslen's test: deferred today          Gait & Posture Assessment  Ambulation: Unassisted Gait: Relatively normal for age and body habitus Posture: WNL   Lower Extremity Exam    Side: Right lower extremity  Side: Left lower extremity  Stability: No instability observed          Stability: No  instability observed          Skin & Extremity Inspection: Skin color, temperature, and hair growth are WNL. No peripheral edema or cyanosis. No masses, redness, swelling, asymmetry, or associated skin lesions. No contractures.  Skin & Extremity Inspection: Skin color, temperature, and hair growth are WNL. No peripheral edema or cyanosis. No masses, redness, swelling, asymmetry, or associated skin lesions. No contractures.  Functional ROM: Unrestricted ROM                  Functional ROM: Unrestricted ROM                  Muscle Tone/Strength: Functionally intact. No obvious neuro-muscular anomalies detected.  Muscle Tone/Strength: Functionally intact. No obvious neuro-muscular anomalies detected.  Sensory (Neurological): Unimpaired  Sensory (Neurological): Unimpaired  Palpation: No palpable anomalies  Palpation: No palpable anomalies   Assessment  Primary Diagnosis & Pertinent Problem List: The primary encounter diagnosis was Lumbar radiculopathy. Diagnoses of Lumbar degenerative disc disease, Lumbar facet arthropathy, Lumbar spondylosis, DDD (degenerative disc disease), lumbar, Facet syndrome, lumbar, and Chronic pain syndrome were also pertinent to this visit.  Visit Diagnosis (New problems to examiner): 1. Lumbar radiculopathy   2. Lumbar degenerative disc disease   3. Lumbar facet arthropathy   4. Lumbar spondylosis   5. DDD (degenerative disc disease), lumbar   6. Facet syndrome, lumbar   7. Chronic pain syndrome   General  Recommendations: The pain condition that the patient suffers from is best treated with a multidisciplinary approach that involves an increase in physical activity to prevent de-conditioning and worsening of the pain cycle, as well as psychological counseling (formal and/or informal) to address the co-morbid psychological affects of pain. Treatment will often involve judicious use of pain medications and interventional procedures to decrease the pain, allowing the patient to participate in the physical activity that will ultimately produce long-lasting pain reductions. The goal of the multidisciplinary approach is to return the patient to a higher level of overall function and to restore their ability to perform activities of daily living.   57 year old female with a history of left axial low back pain that radiates down her left hip and left thigh secondary to lumbar radiculopathy, lumbar degenerative disc disease, lumbar spondylosis.  Patient does have a disc bulge at L5-S1 on the left resulting in L5 radiculopathy and L5-S1 facet pathology.  Regards to treatment plan, we discussed left epidural steroid injection for her left radicular symptoms.  Risks and benefits were discussed and patient would like to proceed.  We will also obtain a routine UDS which is standard for new patients.  We will also prescribe Lyrica 50 mg 3 times daily with titration instructions to help with her neuropathic pain.  Plan: -UDS today -Left L4-L5 ESI with sedation -Diclofenac 75 mg twice daily.  Patient instructed to stop all other NSAIDs.  Future considerations: -Lumbar facet medial branch nerve blocks -Left SI joint injection  Note: Please be advised that as per protocol, today's visit has been an evaluation only. We have not taken over the patient's controlled substance management.  Ordered Lab-work, Procedure(s), Referral(s), & Consult(s): Orders Placed This Encounter  Procedures  . Lumbar Epidural Injection  .  Compliance Drug Analysis, Ur   Pharmacotherapy (current): Medications ordered:  Meds ordered this encounter  Medications  . DISCONTD: pregabalin (LYRICA) 50 MG capsule    Sig: Take 1 capsule (50 mg total) by mouth 3 (three) times daily.    Dispense:  90 capsule  Refill:  0    Do not place this medication, or any other prescription from our practice, on "Automatic Refill". Patient may have prescription filled one day early if pharmacy is closed on scheduled refill date.  Marland Kitchen DISCONTD: diclofenac (VOLTAREN) 75 MG EC tablet    Sig: Take 1 tablet (75 mg total) by mouth 2 (two) times daily after a meal.    Dispense:  60 tablet    Refill:  0  . DISCONTD: pregabalin (LYRICA) 50 MG capsule    Sig: Take 1 capsule (50 mg total) by mouth 3 (three) times daily.    Dispense:  90 capsule    Refill:  1    Do not place this medication, or any other prescription from our practice, on "Automatic Refill". Patient may have prescription filled one day early if pharmacy is closed on scheduled refill date.  . diclofenac (VOLTAREN) 75 MG EC tablet    Sig: Take 1 tablet (75 mg total) by mouth 2 (two) times daily after a meal.    Dispense:  60 tablet    Refill:  0  . pregabalin (LYRICA) 50 MG capsule    Sig: Take 1 capsule (50 mg total) by mouth 3 (three) times daily.    Dispense:  90 capsule    Refill:  1    Do not place this medication, or any other prescription from our practice, on "Automatic Refill". Patient may have prescription filled one day early if pharmacy is closed on scheduled refill date.   Medications administered during this visit: Syra K. Mumpower had no medications administered during this visit.   Pharmacological management options:  Opioid Analgesics: The patient was informed that there is no guarantee that she would be a candidate for opioid analgesics. The decision will be made following CDC guidelines. This decision will be based on the results of diagnostic studies, as well as Ms.  Karras's risk profile.   Membrane stabilizer: To be determined at a later time  Muscle relaxant: To be determined at a later time  NSAID: To be determined at a later time  Other analgesic(s): To be determined at a later time   Interventional management options: Ms. Harpole was informed that there is no guarantee that she would be a candidate for interventional therapies. The decision will be based on the results of diagnostic studies, as well as Ms. Mccullers's risk profile.  Procedure(s) under consideration:  Lumbar epidural steroid injection Lumbar facet medial branch nerve block SI joint injection Right shoulder injection Right elbow injection   Provider-requested follow-up: Return in about 2 weeks (around 01/06/2018) for Procedure.  Future Appointments  Date Time Provider [place]  01/07/2018  9:45 AM Gillis Santa, MD [facility] None  01/30/2018  3:40 PM Chrismon, [name], PA [facility] None  11/06/2018  2:00 PM [facility]SE HEALTH ADVISOR [facility] None    Primary Care Physician: Margo Common, PA Location: [facility] Note by: Gillis Santa, M.D, Date: 12/23/2017; Time: 2:32 PM  Patient Instructions   Pain Management Discharge Instructions  General Discharge Instructions :  If you need to reach your doctor call: Monday-Friday 8:00 am - 4:00 pm at [phone] or toll free [phone].  After clinic hours [phone] to have operator reach doctor.  Bring all of your medication bottles to all your appointments in the pain clinic.  To cancel or reschedule your appointment with Pain Management please remember to call 24 hours in advance to avoid a fee.  Refer to the educational  materials which you have been given on: General Risks, I had my Procedure. Discharge Instructions, Post Sedation.  Post Procedure Instructions:  The drugs you were given will stay in your system until tomorrow, so for the next 24 hours you should not drive, make any  legal decisions or drink any alcoholic beverages.  You may eat anything you prefer, but it is better to start with liquids then soups and crackers, and gradually work up to solid foods.  Please notify your doctor immediately if you have any unusual bleeding, trouble breathing or pain that is not related to your normal pain.  Depending on the type of procedure that was done, some parts of your body may feel week and/or numb.  This usually clears up by tonight or the next day.  Walk with the use of an assistive device or accompanied by an adult for the 24 hours.  You may use ice on the affected area for the first 24 hours.  Put ice in a Ziploc bag and cover with a towel and place against area 15 minutes on 15 minutes off.  You may switch to heat after 24 hours.GENERAL RISKS AND COMPLICATIONS  What are the risk, side effects and possible complications? Generally speaking, most procedures are safe.  However, with any procedure there are risks, side effects, and the possibility of complications.  The risks and complications are dependent upon the sites that are lesioned, or the type of nerve block to be performed.  The closer the procedure is to the spine, the more serious the risks are.  Great care is taken when placing the radio frequency needles, block needles or lesioning probes, but sometimes complications can occur. 1. Infection: Any time there is an injection through the skin, there is a risk of infection.  This is why sterile conditions are used for these blocks.  There are four possible types of infection. 1. Localized skin infection. 2. Central Nervous System Infection-This can be in the form of Meningitis, which can be deadly. 3. Epidural Infections-This can be in the form of an epidural abscess, which can cause pressure inside of the spine, causing compression of the spinal cord with subsequent paralysis. This would require an emergency surgery to decompress, and there are no guarantees that  the patient would recover from the paralysis. 4. Discitis-This is an infection of the intervertebral discs.  It occurs in about 1% of discography procedures.  It is difficult to treat and it may lead to surgery.        2. Pain: the needles have to go through skin and soft tissues, will cause soreness.       3. Damage to internal structures:  The nerves to be lesioned may be near blood vessels or    other nerves which can be potentially damaged.       4. Bleeding: Bleeding is more common if the patient is taking blood thinners such as  aspirin, Coumadin, Ticiid, Plavix, etc., or if he/she have some genetic predisposition  such as hemophilia. Bleeding into the spinal canal can cause compression of the spinal  cord with subsequent paralysis.  This would require an emergency surgery to  decompress and there are no guarantees that the patient would recover from the  paralysis.       5. Pneumothorax:  Puncturing of a lung is a possibility, every time a needle is introduced in  the area of the chest or upper back.  Pneumothorax refers to free air around the  collapsed lung(s), inside of the thoracic cavity (chest cavity).  Another two possible  complications related to a similar event would include: Hemothorax and Chylothorax.   These are variations of the Pneumothorax, where instead of air around the collapsed  lung(s), you may have blood or chyle, respectively.       6. Spinal headaches: They may occur with any procedures in the area of the spine.       7. Persistent CSF (Cerebro-Spinal Fluid) leakage: This is a rare problem, but may occur  with prolonged intrathecal or epidural catheters either due to the formation of a fistulous  track or a dural tear.       8. Nerve damage: By working so close to the spinal cord, there is always a possibility of  nerve damage, which could be as serious as a permanent spinal cord injury with  paralysis.       9. Death:  Although rare, severe deadly allergic reactions known  as "Anaphylactic  reaction" can occur to any of the medications used.      10. Worsening of the symptoms:  We can always make thing worse.  What are the chances of something like this happening? Chances of any of this occuring are extremely low.  By statistics, you have more of a chance of getting killed in a motor vehicle accident: while driving to the hospital than any of the above occurring .  Nevertheless, you should be aware that they are possibilities.  In general, it is similar to taking a shower.  Everybody knows that you can slip, hit your head and get killed.  Does that mean that you should not shower again?  Nevertheless always keep in mind that statistics do not mean anything if you happen to be on the wrong side of them.  Even if a procedure has a 1 (one) in a 1,000,000 (million) chance of going wrong, it you happen to be that one..Also, keep in mind that by statistics, you have more of a chance of having something go wrong when taking medications.  Who should not have this procedure? If you are on a blood thinning medication (e.g. Coumadin, Plavix, see list of "Blood Thinners"), or if you have an active infection going on, you should not have the procedure.  If you are taking any blood thinners, please inform your physician.  How should I prepare for this procedure?  Do not eat or drink anything at least six hours prior to the procedure.  Bring a driver with you .  It cannot be a taxi.  Come accompanied by an adult that can drive you back, and that is strong enough to help you if your legs get weak or numb from the local anesthetic.  Take all of your medicines the morning of the procedure with just enough water to swallow them.  If you have diabetes, make sure that you are scheduled to have your procedure done first thing in the morning, whenever possible.  If you have diabetes, take only half of your insulin dose and notify our nurse that you have done so as soon as you arrive at  the clinic.  If you are diabetic, but only take blood sugar pills (oral hypoglycemic), then do not take them on the morning of your procedure.  You may take them after you have had the procedure.  Do not take aspirin or any aspirin-containing medications, at least eleven (11) days prior to the procedure.  They may prolong bleeding.  Wear loose fitting clothing that may be easy to take off and that you would not mind if it got stained with Betadine or blood.  Do not wear any jewelry or perfume  Remove any nail coloring.  It will interfere with some of our monitoring equipment.  NOTE: Remember that this is not meant to be interpreted as a complete list of all possible complications.  Unforeseen problems may occur.  BLOOD THINNERS The following drugs contain aspirin or other products, which can cause increased bleeding during surgery and should not be taken for 2 weeks prior to and 1 week after surgery.  If you should need take something for relief of minor pain, you may take acetaminophen which is found in Tylenol,m Datril, Anacin-3 and Panadol. It is not blood thinner. The products listed below are.  Do not take any of the products listed below in addition to any listed on your instruction sheet.  A.P.C or A.P.C with Codeine Codeine Phosphate Capsules #3 Ibuprofen Ridaura  ABC compound Congesprin Imuran rimadil  Advil Cope Indocin Robaxisal  Alka-Seltzer Effervescent Pain Reliever and Antacid Coricidin or Coricidin-D  Indomethacin Rufen  Alka-Seltzer plus Cold Medicine Cosprin Ketoprofen S-A-C Tablets  Anacin Analgesic Tablets or Capsules Coumadin Korlgesic Salflex  Anacin Extra Strength Analgesic tablets or capsules CP-2 Tablets Lanoril Salicylate  Anaprox Cuprimine Capsules Levenox Salocol  Anexsia-D Dalteparin Magan Salsalate  Anodynos Darvon compound Magnesium Salicylate Sine-off  Ansaid Dasin Capsules Magsal Sodium Salicylate  Anturane Depen Capsules Marnal Soma  APF Arthritis pain  formula Dewitt's Pills Measurin Stanback  Argesic Dia-Gesic Meclofenamic Sulfinpyrazone  Arthritis Bayer Timed Release Aspirin Diclofenac Meclomen Sulindac  Arthritis pain formula Anacin Dicumarol Medipren Supac  Analgesic (Safety coated) Arthralgen Diffunasal Mefanamic Suprofen  Arthritis Strength Bufferin Dihydrocodeine Mepro Compound Suprol  Arthropan liquid Dopirydamole Methcarbomol with Aspirin Synalgos  ASA tablets/Enseals Disalcid Micrainin Tagament  Ascriptin Doan's Midol Talwin  Ascriptin A/D Dolene Mobidin Tanderil  Ascriptin Extra Strength Dolobid Moblgesic Ticlid  Ascriptin with Codeine Doloprin or Doloprin with Codeine Momentum Tolectin  Asperbuf Duoprin Mono-gesic Trendar  Aspergum Duradyne Motrin or Motrin IB Triminicin  Aspirin plain, buffered or enteric coated Durasal Myochrisine Trigesic  Aspirin Suppositories Easprin Nalfon Trillsate  Aspirin with Codeine Ecotrin Regular or Extra Strength Naprosyn Uracel  Atromid-S Efficin Naproxen Ursinus  Auranofin Capsules Elmiron Neocylate Vanquish  Axotal Emagrin Norgesic Verin  Azathioprine Empirin or Empirin with Codeine Normiflo Vitamin E  Azolid Emprazil Nuprin Voltaren  Bayer Aspirin plain, buffered or children's or timed BC Tablets or powders Encaprin Orgaran Warfarin Sodium  Buff-a-Comp Enoxaparin Orudis Zorpin  Buff-a-Comp with Codeine Equegesic Os-Cal-Gesic   Buffaprin Excedrin plain, buffered or Extra Strength Oxalid   Bufferin Arthritis Strength Feldene Oxphenbutazone   Bufferin plain or Extra Strength Feldene Capsules Oxycodone with Aspirin   Bufferin with Codeine Fenoprofen Fenoprofen Pabalate or Pabalate-SF   Buffets II Flogesic Panagesic   Buffinol plain or Extra Strength Florinal or Florinal with Codeine Panwarfarin   Buf-Tabs Flurbiprofen Penicillamine   Butalbital Compound Four-way cold tablets Penicillin   Butazolidin Fragmin Pepto-Bismol   Carbenicillin Geminisyn Percodan   Carna Arthritis Reliever Geopen  Persantine   Carprofen Gold's salt Persistin   Chloramphenicol Goody's Phenylbutazone   Chloromycetin Haltrain Piroxlcam   Clmetidine heparin Plaquenil   Cllnoril Hyco-pap Ponstel   Clofibrate Hydroxy chloroquine Propoxyphen         Before stopping any of these medications, be sure to consult the physician who ordered them.  Some, such as Coumadin (Warfarin) are ordered to prevent  or treat serious conditions such as "deep thrombosis", "pumonary embolisms", and other heart problems.  The amount of time that you may need off of the medication may also vary with the medication and the reason for which you were taking it.  If you are taking any of these medications, please make sure you notify your pain physician before you undergo any procedures.         Epidural Steroid Injection Patient Information  Description: The epidural space surrounds the nerves as they exit the spinal cord.  In some patients, the nerves can be compressed and inflamed by a bulging disc or a tight spinal canal (spinal stenosis).  By injecting steroids into the epidural space, we can bring irritated nerves into direct contact with a potentially helpful medication.  These steroids act directly on the irritated nerves and can reduce swelling and inflammation which often leads to decreased pain.  Epidural steroids may be injected anywhere along the spine and from the neck to the low back depending upon the location of your pain.   After numbing the skin with local anesthetic (like Novocaine), a small needle is passed into the epidural space slowly.  You may experience a sensation of pressure while this is being done.  The entire block usually last less than 10 minutes.  Conditions which may be treated by epidural steroids:   Low back and leg pain  Neck and arm pain  Spinal stenosis  Post-laminectomy syndrome  Herpes zoster (shingles) pain  Pain from compression fractures  Preparation for the injection:  1. Do  not eat any solid food or dairy products within 8 hours of your appointment.  2. You may drink clear liquids up to 3 hours before appointment.  Clear liquids include water, black coffee, juice or soda.  No milk or cream please. 3. You may take your regular medication, including pain medications, with a sip of water before your appointment  Diabetics should hold regular insulin (if taken separately) and take 1/2 normal NPH dos the morning of the procedure.  Carry some sugar containing items with you to your appointment. 4. A driver must accompany you and be prepared to drive you home after your procedure.  5. Bring all your current medications with your. 6. An IV may be inserted and sedation may be given at the discretion of the physician.   7. A blood pressure cuff, EKG and other monitors will often be applied during the procedure.  Some patients may need to have extra oxygen administered for a short period. 8. You will be asked to provide medical information, including your allergies, prior to the procedure.  We must know immediately if you are taking blood thinners (like Coumadin/Warfarin)  Or if you are allergic to IV iodine contrast (dye). We must know if you could possible be pregnant.  Possible side-effects:  Bleeding from needle site  Infection (rare, may require surgery)  Nerve injury (rare)  Numbness & tingling (temporary)  Difficulty urinating (rare, temporary)  Spinal headache ( a headache worse with upright posture)  Light -headedness (temporary)  Pain at injection site (several days)  Decreased blood pressure (temporary)  Weakness in arm/leg (temporary)  Pressure sensation in back/neck (temporary)  Call if you experience:  Fever/chills associated with headache or increased back/neck pain.  Headache worsened by an upright position.  New onset weakness or numbness of an extremity below the injection site  Hives or difficulty breathing (go to the emergency  room)  Inflammation or drainage at  the infection site  Severe back/neck pain  Any new symptoms which are concerning to you  Please note:  Although the local anesthetic injected can often make your back or neck feel good for several hours after the injection, the pain will likely return.  It takes 3-7 days for steroids to work in the epidural space.  You may not notice any pain relief for at least that one week.  If effective, we will often do a series of three injections spaced 3-6 weeks apart to maximally decrease your pain.  After the initial series, we generally will wait several months before considering a repeat injection of the same type.  If you have any questions, please call [phone] [facility]

## 2017-12-23 NOTE — Progress Notes (Signed)
Safety precautions to be maintained throughout the outpatient stay will include: orient to surroundings, keep bed in low position, maintain call bell within reach at all times, provide assistance with transfer out of bed and ambulation.  

## 2017-12-23 NOTE — Patient Instructions (Signed)
Pain Management Discharge Instructions  General Discharge Instructions :  If you need to reach your doctor call: Monday-Friday 8:00 am - 4:00 pm at [phone] or toll free [phone].  After clinic hours [phone] to have operator reach doctor.  Bring all of your medication bottles to all your appointments in the pain clinic.  To cancel or reschedule your appointment with Pain Management please remember to call 24 hours in advance to avoid a fee.  Refer to the educational materials which you have been given on: General Risks, I had my Procedure. Discharge Instructions, Post Sedation.  Post Procedure Instructions:  The drugs you were given will stay in your system until tomorrow, so for the next 24 hours you should not drive, make any legal decisions or drink any alcoholic beverages.  You may eat anything you prefer, but it is better to start with liquids then soups and crackers, and gradually work up to solid foods.  Please notify your doctor immediately if you have any unusual bleeding, trouble breathing or pain that is not related to your normal pain.  Depending on the type of procedure that was done, some parts of your body may feel week and/or numb.  This usually clears up by tonight or the next day.  Walk with the use of an assistive device or accompanied by an adult for the 24 hours.  You may use ice on the affected area for the first 24 hours.  Put ice in a Ziploc bag and cover with a towel and place against area 15 minutes on 15 minutes off.  You may switch to heat after 24 hours.GENERAL RISKS AND COMPLICATIONS  What are the risk, side effects and possible complications? Generally speaking, most procedures are safe.  However, with any procedure there are risks, side effects, and the possibility of complications.  The risks and complications are dependent upon the sites that are lesioned, or the type of nerve block to be performed.  The closer the procedure is to the spine,  the more serious the risks are.  Great care is taken when placing the radio frequency needles, block needles or lesioning probes, but sometimes complications can occur. 1. Infection: Any time there is an injection through the skin, there is a risk of infection.  This is why sterile conditions are used for these blocks.  There are four possible types of infection. 1. Localized skin infection. 2. Central Nervous System Infection-This can be in the form of Meningitis, which can be deadly. 3. Epidural Infections-This can be in the form of an epidural abscess, which can cause pressure inside of the spine, causing compression of the spinal cord with subsequent paralysis. This would require an emergency surgery to decompress, and there are no guarantees that the patient would recover from the paralysis. 4. Discitis-This is an infection of the intervertebral discs.  It occurs in about 1% of discography procedures.  It is difficult to treat and it may lead to surgery.        2. Pain: the needles have to go through skin and soft tissues, will cause soreness.       3. Damage to internal structures:  The nerves to be lesioned may be near blood vessels or    other nerves which can be potentially damaged.       4. Bleeding: Bleeding is more common if the patient is taking blood thinners such as  aspirin, Coumadin, Ticiid, Plavix, etc., or if he/she have some genetic predisposition  such as   hemophilia. Bleeding into the spinal canal can cause compression of the spinal  cord with subsequent paralysis.  This would require an emergency surgery to  decompress and there are no guarantees that the patient would recover from the  paralysis.       5. Pneumothorax:  Puncturing of a lung is a possibility, every time a needle is introduced in  the area of the chest or upper back.  Pneumothorax refers to free air around the  collapsed lung(s), inside of the thoracic cavity (chest cavity).  Another two possible  complications  related to a similar event would include: Hemothorax and Chylothorax.   These are variations of the Pneumothorax, where instead of air around the collapsed  lung(s), you may have blood or chyle, respectively.       6. Spinal headaches: They may occur with any procedures in the area of the spine.       7. Persistent CSF (Cerebro-Spinal Fluid) leakage: This is a rare problem, but may occur  with prolonged intrathecal or epidural catheters either due to the formation of a fistulous  track or a dural tear.       8. Nerve damage: By working so close to the spinal cord, there is always a possibility of  nerve damage, which could be as serious as a permanent spinal cord injury with  paralysis.       9. Death:  Although rare, severe deadly allergic reactions known as "Anaphylactic  reaction" can occur to any of the medications used.      10. Worsening of the symptoms:  We can always make thing worse.  What are the chances of something like this happening? Chances of any of this occuring are extremely low.  By statistics, you have more of a chance of getting killed in a motor vehicle accident: while driving to the hospital than any of the above occurring .  Nevertheless, you should be aware that they are possibilities.  In general, it is similar to taking a shower.  Everybody knows that you can slip, hit your head and get killed.  Does that mean that you should not shower again?  Nevertheless always keep in mind that statistics do not mean anything if you happen to be on the wrong side of them.  Even if a procedure has a 1 (one) in a 1,000,000 (million) chance of going wrong, it you happen to be that one..Also, keep in mind that by statistics, you have more of a chance of having something go wrong when taking medications.  Who should not have this procedure? If you are on a blood thinning medication (e.g. Coumadin, Plavix, see list of "Blood Thinners"), or if you have an active infection going on, you should not  have the procedure.  If you are taking any blood thinners, please inform your physician.  How should I prepare for this procedure?  Do not eat or drink anything at least six hours prior to the procedure.  Bring a driver with you .  It cannot be a taxi.  Come accompanied by an adult that can drive you back, and that is strong enough to help you if your legs get weak or numb from the local anesthetic.  Take all of your medicines the morning of the procedure with just enough water to swallow them.  If you have diabetes, make sure that you are scheduled to have your procedure done first thing in the morning, whenever possible.  If you have diabetes,   take only half of your insulin dose and notify our nurse that you have done so as soon as you arrive at the clinic.  If you are diabetic, but only take blood sugar pills (oral hypoglycemic), then do not take them on the morning of your procedure.  You may take them after you have had the procedure.  Do not take aspirin or any aspirin-containing medications, at least eleven (11) days prior to the procedure.  They may prolong bleeding.  Wear loose fitting clothing that may be easy to take off and that you would not mind if it got stained with Betadine or blood.  Do not wear any jewelry or perfume  Remove any nail coloring.  It will interfere with some of our monitoring equipment.  NOTE: Remember that this is not meant to be interpreted as a complete list of all possible complications.  Unforeseen problems may occur.  BLOOD THINNERS The following drugs contain aspirin or other products, which can cause increased bleeding during surgery and should not be taken for 2 weeks prior to and 1 week after surgery.  If you should need take something for relief of minor pain, you may take acetaminophen which is found in Tylenol,m Datril, Anacin-3 and Panadol. It is not blood thinner. The products listed below are.  Do not take any of the products listed below  in addition to any listed on your instruction sheet.  A.P.C or A.P.C with Codeine Codeine Phosphate Capsules #3 Ibuprofen Ridaura  ABC compound Congesprin Imuran rimadil  Advil Cope Indocin Robaxisal  Alka-Seltzer Effervescent Pain Reliever and Antacid Coricidin or Coricidin-D  Indomethacin Rufen  Alka-Seltzer plus Cold Medicine Cosprin Ketoprofen S-A-C Tablets  Anacin Analgesic Tablets or Capsules Coumadin Korlgesic Salflex  Anacin Extra Strength Analgesic tablets or capsules CP-2 Tablets Lanoril Salicylate  Anaprox Cuprimine Capsules Levenox Salocol  Anexsia-D Dalteparin Magan Salsalate  Anodynos Darvon compound Magnesium Salicylate Sine-off  Ansaid Dasin Capsules Magsal Sodium Salicylate  Anturane Depen Capsules Marnal Soma  APF Arthritis pain formula Dewitt's Pills Measurin Stanback  Argesic Dia-Gesic Meclofenamic Sulfinpyrazone  Arthritis Bayer Timed Release Aspirin Diclofenac Meclomen Sulindac  Arthritis pain formula Anacin Dicumarol Medipren Supac  Analgesic (Safety coated) Arthralgen Diffunasal Mefanamic Suprofen  Arthritis Strength Bufferin Dihydrocodeine Mepro Compound Suprol  Arthropan liquid Dopirydamole Methcarbomol with Aspirin Synalgos  ASA tablets/Enseals Disalcid Micrainin Tagament  Ascriptin Doan's Midol Talwin  Ascriptin A/D Dolene Mobidin Tanderil  Ascriptin Extra Strength Dolobid Moblgesic Ticlid  Ascriptin with Codeine Doloprin or Doloprin with Codeine Momentum Tolectin  Asperbuf Duoprin Mono-gesic Trendar  Aspergum Duradyne Motrin or Motrin IB Triminicin  Aspirin plain, buffered or enteric coated Durasal Myochrisine Trigesic  Aspirin Suppositories Easprin Nalfon Trillsate  Aspirin with Codeine Ecotrin Regular or Extra Strength Naprosyn Uracel  Atromid-S Efficin Naproxen Ursinus  Auranofin Capsules Elmiron Neocylate Vanquish  Axotal Emagrin Norgesic Verin  Azathioprine Empirin or Empirin with Codeine Normiflo Vitamin E  Azolid Emprazil Nuprin Voltaren  Bayer  Aspirin plain, buffered or children's or timed BC Tablets or powders Encaprin Orgaran Warfarin Sodium  Buff-a-Comp Enoxaparin Orudis Zorpin  Buff-a-Comp with Codeine Equegesic Os-Cal-Gesic   Buffaprin Excedrin plain, buffered or Extra Strength Oxalid   Bufferin Arthritis Strength Feldene Oxphenbutazone   Bufferin plain or Extra Strength Feldene Capsules Oxycodone with Aspirin   Bufferin with Codeine Fenoprofen Fenoprofen Pabalate or Pabalate-SF   Buffets II Flogesic Panagesic   Buffinol plain or Extra Strength Florinal or Florinal with Codeine Panwarfarin   Buf-Tabs Flurbiprofen Penicillamine   Butalbital Compound Four-way cold tablets   Penicillin   Butazolidin Fragmin Pepto-Bismol   Carbenicillin Geminisyn Percodan   Carna Arthritis Reliever Geopen Persantine   Carprofen Gold's salt Persistin   Chloramphenicol Goody's Phenylbutazone   Chloromycetin Haltrain Piroxlcam   Clmetidine heparin Plaquenil   Cllnoril Hyco-pap Ponstel   Clofibrate Hydroxy chloroquine Propoxyphen         Before stopping any of these medications, be sure to consult the physician who ordered them.  Some, such as Coumadin (Warfarin) are ordered to prevent or treat serious conditions such as "deep thrombosis", "pumonary embolisms", and other heart problems.  The amount of time that you may need off of the medication may also vary with the medication and the reason for which you were taking it.  If you are taking any of these medications, please make sure you notify your pain physician before you undergo any procedures.         Epidural Steroid Injection Patient Information  Description: The epidural space surrounds the nerves as they exit the spinal cord.  In some patients, the nerves can be compressed and inflamed by a bulging disc or a tight spinal canal (spinal stenosis).  By injecting steroids into the epidural space, we can bring irritated nerves into direct contact with a potentially helpful medication.   These steroids act directly on the irritated nerves and can reduce swelling and inflammation which often leads to decreased pain.  Epidural steroids may be injected anywhere along the spine and from the neck to the low back depending upon the location of your pain.   After numbing the skin with local anesthetic (like Novocaine), a small needle is passed into the epidural space slowly.  You may experience a sensation of pressure while this is being done.  The entire block usually last less than 10 minutes.  Conditions which may be treated by epidural steroids:   Low back and leg pain  Neck and arm pain  Spinal stenosis  Post-laminectomy syndrome  Herpes zoster (shingles) pain  Pain from compression fractures  Preparation for the injection:  1. Do not eat any solid food or dairy products within 8 hours of your appointment.  2. You may drink clear liquids up to 3 hours before appointment.  Clear liquids include water, black coffee, juice or soda.  No milk or cream please. 3. You may take your regular medication, including pain medications, with a sip of water before your appointment  Diabetics should hold regular insulin (if taken separately) and take 1/2 normal NPH dos the morning of the procedure.  Carry some sugar containing items with you to your appointment. 4. A driver must accompany you and be prepared to drive you home after your procedure.  5. Bring all your current medications with your. 6. An IV may be inserted and sedation may be given at the discretion of the physician.   7. A blood pressure cuff, EKG and other monitors will often be applied during the procedure.  Some patients may need to have extra oxygen administered for a short period. 8. You will be asked to provide medical information, including your allergies, prior to the procedure.  We must know immediately if you are taking blood thinners (like Coumadin/Warfarin)  Or if you are allergic to IV iodine contrast (dye). We  must know if you could possible be pregnant.  Possible side-effects:  Bleeding from needle site  Infection (rare, may require surgery)  Nerve injury (rare)  Numbness & tingling (temporary)  Difficulty urinating (rare, temporary)  Spinal headache (   a headache worse with upright posture)  Light -headedness (temporary)  Pain at injection site (several days)  Decreased blood pressure (temporary)  Weakness in arm/leg (temporary)  Pressure sensation in back/neck (temporary)  Call if you experience:  Fever/chills associated with headache or increased back/neck pain.  Headache worsened by an upright position.  New onset weakness or numbness of an extremity below the injection site  Hives or difficulty breathing (go to the emergency room)  Inflammation or drainage at the infection site  Severe back/neck pain  Any new symptoms which are concerning to you  Please note:  Although the local anesthetic injected can often make your back or neck feel good for several hours after the injection, the pain will likely return.  It takes 3-7 days for steroids to work in the epidural space.  You may not notice any pain relief for at least that one week.  If effective, we will often do a series of three injections spaced 3-6 weeks apart to maximally decrease your pain.  After the initial series, we generally will wait several months before considering a repeat injection of the same type.  If you have any questions, please call [phone] [facility] 

## 2017-12-29 LAB — COMPLIANCE DRUG ANALYSIS, UR

## 2018-01-07 ENCOUNTER — Ambulatory Visit (HOSPITAL_BASED_OUTPATIENT_CLINIC_OR_DEPARTMENT_OTHER): Payer: Medicare Other | Admitting: Student in an Organized Health Care Education/Training Program

## 2018-01-07 ENCOUNTER — Other Ambulatory Visit: Payer: Self-pay

## 2018-01-07 ENCOUNTER — Encounter: Payer: Self-pay | Admitting: Student in an Organized Health Care Education/Training Program

## 2018-01-07 ENCOUNTER — Ambulatory Visit
Admission: RE | Admit: 2018-01-07 | Discharge: 2018-01-07 | Disposition: A | Discharge status: Home / Self Care | Payer: Medicare Other | Source: Ambulatory Visit | Attending: Student in an Organized Health Care Education/Training Program | Admitting: Student in an Organized Health Care Education/Training Program

## 2018-01-07 VITALS — BP 120/70 | HR 68 | Temp 97.6°F | Resp 12 | Ht 69.0 in | Wt 230.0 lb

## 2018-01-07 DIAGNOSIS — Z90710 Acquired absence of both cervix and uterus: Secondary | ICD-10-CM | POA: Diagnosis not present

## 2018-01-07 DIAGNOSIS — M79605 Pain in left leg: Secondary | ICD-10-CM | POA: Diagnosis present

## 2018-01-07 DIAGNOSIS — F419 Anxiety disorder, unspecified: Secondary | ICD-10-CM | POA: Diagnosis not present

## 2018-01-07 DIAGNOSIS — Z7984 Long term (current) use of oral hypoglycemic drugs: Secondary | ICD-10-CM | POA: Diagnosis not present

## 2018-01-07 DIAGNOSIS — M5416 Radiculopathy, lumbar region: Secondary | ICD-10-CM | POA: Diagnosis not present

## 2018-01-07 DIAGNOSIS — Z7982 Long term (current) use of aspirin: Secondary | ICD-10-CM | POA: Insufficient documentation

## 2018-01-07 DIAGNOSIS — Z79899 Other long term (current) drug therapy: Secondary | ICD-10-CM | POA: Insufficient documentation

## 2018-01-07 DIAGNOSIS — M79601 Pain in right arm: Secondary | ICD-10-CM | POA: Diagnosis not present

## 2018-01-07 MED ORDER — LIDOCAINE HCL 2 % IJ SOLN
10.0000 mL | Freq: Once | INTRAMUSCULAR | Status: AC
  Administered 2018-01-07: 400 mg

## 2018-01-07 MED ORDER — IOPAMIDOL (ISOVUE-M 200) INJECTION 41%
INTRAMUSCULAR | Status: AC
  Filled 2018-01-07: qty 10

## 2018-01-07 MED ORDER — DEXAMETHASONE SODIUM PHOSPHATE 10 MG/ML IJ SOLN
INTRAMUSCULAR | Status: AC
  Filled 2018-01-07: qty 1

## 2018-01-07 MED ORDER — ROPIVACAINE HCL 2 MG/ML IJ SOLN
2.0000 mL | Freq: Once | INTRAMUSCULAR | Status: AC
  Administered 2018-01-07: 2 mL via EPIDURAL

## 2018-01-07 MED ORDER — DEXAMETHASONE SODIUM PHOSPHATE 10 MG/ML IJ SOLN
10.0000 mg | Freq: Once | INTRAMUSCULAR | Status: AC
  Administered 2018-01-07: 10 mg

## 2018-01-07 MED ORDER — IOPAMIDOL (ISOVUE-M 200) INJECTION 41%
10.0000 mL | Freq: Once | INTRAMUSCULAR | Status: AC
  Administered 2018-01-07: 10 mL via EPIDURAL

## 2018-01-07 MED ORDER — FENTANYL CITRATE (PF) 100 MCG/2ML IJ SOLN
25.0000 ug | INTRAMUSCULAR | Status: DC | PRN
  Administered 2018-01-07: 50 ug via INTRAVENOUS

## 2018-01-07 MED ORDER — LACTATED RINGERS IV SOLN: 1000.0000 mL | Freq: Once | INTRAVENOUS | Status: DC

## 2018-01-07 MED ORDER — LIDOCAINE HCL 2 % IJ SOLN
INTRAMUSCULAR | Status: AC
  Filled 2018-01-07: qty 20

## 2018-01-07 MED ORDER — SODIUM CHLORIDE 0.9% FLUSH
2.0000 mL | Freq: Once | INTRAVENOUS | Status: AC
  Administered 2018-01-07: 2 mL

## 2018-01-07 MED ORDER — FENTANYL CITRATE (PF) 100 MCG/2ML IJ SOLN
INTRAMUSCULAR | Status: AC
  Filled 2018-01-07: qty 2

## 2018-01-07 MED ORDER — SODIUM CHLORIDE 0.9 % IJ SOLN
INTRAMUSCULAR | Status: AC
  Filled 2018-01-07: qty 10

## 2018-01-07 MED ORDER — ROPIVACAINE HCL 2 MG/ML IJ SOLN
INTRAMUSCULAR | Status: AC
  Filled 2018-01-07: qty 10

## 2018-01-07 NOTE — Progress Notes (Signed)
Patient's Name: Emily Stokes  MRN: [id number]  Referring Provider: Margo Common, PA  DOB: 1960-08-30  PCP: Margo Common, PA  DOS: 01/07/2018  Note by: Gillis Santa, MD  Service setting: Ambulatory outpatient  Specialty: Interventional Pain Management  Patient type: Established  Location: [facility] (AMB) Pain Management Facility  Visit type: Interventional Procedure   Primary Reason for Visit: Interventional Pain Management Treatment. CC: Leg Pain (left); Hip Pain (left); and Arm Pain (right)  Procedure:          Anesthesia, Analgesia, Anxiolysis:  Type: Therapeutic Inter-Laminar Epidural Steroid Injection #1  Region: Lumbar Level: L4-5 Level. Laterality: Left-Sided         Type: Moderate (Conscious) Sedation combined with Local Anesthesia Indication(s): Analgesia and Anxiety Route: Intravenous (IV) IV Access: Secured Sedation: Meaningful verbal contact was maintained at all times during the procedure  Local Anesthetic: Lidocaine 1-2%   Indications: 1. Lumbar radiculopathy    Pain Score: Pre-procedure: 8 /10 Post-procedure: 0-No pain/10  Pre-op Assessment:  Emily Stokes is a 57 y.o. (year old), female patient, seen today for interventional treatment. She  has a past surgical history that includes Colonoscopy (1997); Partial hysterectomy (2009); Cesarean section; Lithotripsy (1997); mrsa; Hernia repair (2012); Abdominal hysterectomy; Hartmann's procedure.; and Colostomy takedown (2012). Emily Stokes has a current medication list which includes the following prescription(s): amlodipine, aspirin, atorvastatin, diclofenac, glucose blood, metformin, metoprolol tartrate, ondansetron, onetouch delica lancets fine, pregabalin, and tapentadol, and the following Facility-Administered Medications: fentanyl, lactated ringers, sodium chloride flush, and triamcinolone acetonide. Her primarily concern today is the Leg Pain (left); Hip Pain (left); and Arm Pain (right)  Initial Vital Signs:   Pulse/HCG Rate: 68ECG Heart Rate: 66 Temp: 98.6 F (37 C) Resp: 16 BP: 133/76 SpO2: 100 %  BMI: Estimated body mass index is 33.97 kg/m as calculated from the following:   Height as of this encounter: 5\' 9"  (1.753 m).   Weight as of this encounter: 230 lb (104.3 kg).  Risk Assessment: Allergies: Reviewed. She has No Known Allergies.  Allergy Precautions: None required Coagulopathies: Reviewed. None identified.  Blood-thinner therapy: None at this time Active Infection(s): Reviewed. None identified. Emily Stokes is afebrile  Site Confirmation: Emily Stokes was asked to confirm the procedure and laterality before marking the site Procedure checklist: Completed Consent: Before the procedure and under the influence of no sedative(s), amnesic(s), or anxiolytics, the patient was informed of the treatment options, risks and possible complications. To fulfill our ethical and legal obligations, as recommended by the American Medical Association's Code of Ethics, I have informed the patient of my clinical impression; the nature and purpose of the treatment or procedure; the risks, benefits, and possible complications of the intervention; the alternatives, including doing nothing; the risk(s) and benefit(s) of the alternative treatment(s) or procedure(s); and the risk(s) and benefit(s) of doing nothing. The patient was provided information about the general risks and possible complications associated with the procedure. These may include, but are not limited to: failure to achieve desired goals, infection, bleeding, organ or nerve damage, allergic reactions, paralysis, and death. In addition, the patient was informed of those risks and complications associated to Spine-related procedures, such as failure to decrease pain; infection (i.e.: Meningitis, epidural or intraspinal abscess); bleeding (i.e.: epidural hematoma, subarachnoid hemorrhage, or any other type of intraspinal or peri-dural bleeding); organ  or nerve damage (i.e.: Any type of peripheral nerve, nerve root, or spinal cord injury) with subsequent damage to sensory, motor, and/or autonomic systems, resulting in permanent pain, numbness,  and/or weakness of one or several areas of the body; allergic reactions; (i.e.: anaphylactic reaction); and/or death. Furthermore, the patient was informed of those risks and complications associated with the medications. These include, but are not limited to: allergic reactions (i.e.: anaphylactic or anaphylactoid reaction(s)); adrenal axis suppression; blood sugar elevation that in diabetics may result in ketoacidosis or comma; water retention that in patients with history of congestive heart failure may result in shortness of breath, pulmonary edema, and decompensation with resultant heart failure; weight gain; swelling or edema; medication-induced neural toxicity; particulate matter embolism and blood vessel occlusion with resultant organ, and/or nervous system infarction; and/or aseptic necrosis of one or more joints. Finally, the patient was informed that Medicine is not an exact science; therefore, there is also the possibility of unforeseen or unpredictable risks and/or possible complications that may result in a catastrophic outcome. The patient indicated having understood very clearly. We have given the patient no guarantees and we have made no promises. Enough time was given to the patient to ask questions, all of which were answered to the patient's satisfaction. Emily Stokes has indicated that she wanted to continue with the procedure. Attestation: I, the ordering provider, attest that I have discussed with the patient the benefits, risks, side-effects, alternatives, likelihood of achieving goals, and potential problems during recovery for the procedure that I have provided informed consent. Date  Time: 01/07/2018  9:46 AM  Pre-Procedure Preparation:  Monitoring: As per clinic protocol. Respiration, ETCO2,  SpO2, BP, heart rate and rhythm monitor placed and checked for adequate function Safety Precautions: Patient was assessed for positional comfort and pressure points before starting the procedure. Time-out: I initiated and conducted the "Time-out" before starting the procedure, as per protocol. The patient was asked to participate by confirming the accuracy of the "Time Out" information. Verification of the correct person, site, and procedure were performed and confirmed by me, the nursing staff, and the patient. "Time-out" conducted as per Joint Commission's Universal Protocol (UP.01.01.01). Time: 1025  Description of Procedure:          Position: Prone with head of the table was raised to facilitate breathing. Target Area: The interlaminar space, initially targeting the lower laminar border of the superior vertebral body. Approach: Paramedial approach. Area Prepped: Entire Posterior Lumbar Region Prepping solution: ChloraPrep (2% chlorhexidine gluconate and 70% isopropyl alcohol) Safety Precautions: Aspiration looking for blood return was conducted prior to all injections. At no point did we inject any substances, as a needle was being advanced. No attempts were made at seeking any paresthesias. Safe injection practices and needle disposal techniques used. Medications properly checked for expiration dates. SDV (single dose vial) medications used. Description of the Procedure: Protocol guidelines were followed. The procedure needle was introduced through the skin, ipsilateral to the reported pain, and advanced to the target area. Bone was contacted and the needle walked caudad, until the lamina was cleared. The epidural space was identified using "loss-of-resistance technique" with 2-3 ml of PF-NaCl (0.9% NSS), in a 5cc LOR glass syringe. Vitals:   01/07/18 1030 01/07/18 1040 01/07/18 1050 01/07/18 1059  BP: 133/80 111/63 (!) 146/73 120/70  Pulse:      Resp: 11 13 15 12   Temp:  97.6 F (36.4 C)     TempSrc:      SpO2: 96% 95% 98% 98%  Weight:      Height:        Start Time: 1025 hrs. End Time: 1030 hrs. Materials:  Needle(s) Type: Epidural needle Gauge:  17G Length: 3.5-in Medication(s): Please see orders for medications and dosing details. 7 cc solution made of 5 cc of preservative-free saline, 1 cc of 0.2% ropivacaine, 1 cc of Decadron 10 mg/cc Imaging Guidance (Spinal):          Type of Imaging Technique: Fluoroscopy Guidance (Spinal) Indication(s): Assistance in needle guidance and placement for procedures requiring needle placement in or near specific anatomical locations not easily accessible without such assistance. Exposure Time: Please see nurses notes. Contrast: Before injecting any contrast, we confirmed that the patient did not have an allergy to iodine, shellfish, or radiological contrast. Once satisfactory needle placement was completed at the desired level, radiological contrast was injected. Contrast injected under live fluoroscopy. No contrast complications. See chart for type and volume of contrast used. Fluoroscopic Guidance: I was personally present during the use of fluoroscopy. "Tunnel Vision Technique" used to obtain the best possible view of the target area. Parallax error corrected before commencing the procedure. "Direction-depth-direction" technique used to introduce the needle under continuous pulsed fluoroscopy. Once target was reached, antero-posterior, oblique, and lateral fluoroscopic projection used confirm needle placement in all planes. Images permanently stored in EMR. Interpretation: I personally interpreted the imaging intraoperatively. Adequate needle placement confirmed in multiple planes. Appropriate spread of contrast into desired area was observed. No evidence of afferent or efferent intravascular uptake. No intrathecal or subarachnoid spread observed. Permanent images saved into the patient's record.  Antibiotic Prophylaxis:   Anti-infectives  (From admission, onward)   None     Indication(s): None identified  Post-operative Assessment:  Post-procedure Vital Signs:  Pulse/HCG Rate: 6862 Temp: 97.6 F (36.4 C) Resp: 12 BP: 120/70 SpO2: 98 %  EBL: None  Complications: No immediate post-treatment complications observed by team, or reported by patient.  Note: The patient tolerated the entire procedure well. A repeat set of vitals were taken after the procedure and the patient was kept under observation following institutional policy, for this type of procedure. Post-procedural neurological assessment was performed, showing return to baseline, prior to discharge. The patient was provided with post-procedure discharge instructions, including a section on how to identify potential problems. Should any problems arise concerning this procedure, the patient was given instructions to immediately contact us, at any time, without hesitation. In any case, we plan to contact the patient by telephone for a follow-up status report regarding this interventional procedure.  Comments:  No additional relevant information. 5 out of 5 strength bilateral lower extremity: Plantar flexion, dorsiflexion, knee flexion, knee extension.  Plan of Care   Imaging Orders     DG C-Arm 1-60 Min-No Report Procedure Orders    No procedure(s) ordered today    Medications ordered for procedure: Meds ordered this encounter  Medications  . lactated ringers infusion 1,000 mL  . fentaNYL (SUBLIMAZE) injection 25-100 mcg    Make sure Narcan is available in the pyxis when using this medication. In the event of respiratory depression (RR< 8/min): Titrate NARCAN (naloxone) in increments of 0.1 to 0.2 mg IV at 2-3 minute intervals, until desired degree of reversal.  . ropivacaine (PF) 2 mg/mL (0.2%) (NAROPIN) injection 2 mL  . iopamidol (ISOVUE-M) 41 % intrathecal injection 10 mL  . sodium chloride flush (NS) 0.9 % injection 2 mL  . lidocaine (XYLOCAINE) 2 %  (with pres) injection 200 mg  . dexamethasone (DECADRON) injection 10 mg   Medications administered: We administered fentaNYL, ropivacaine (PF) 2 mg/mL (0.2%), iopamidol, sodium chloride flush, lidocaine, and dexamethasone.  See the medical record for  exact dosing, route, and time of administration.  New Prescriptions   No medications on file   Disposition: Discharge home  Discharge Date & Time: 01/07/2018; 1100 hrs.   Physician-requested Follow-up: Return in about 4 weeks (around 02/04/2018) for Post Procedure Evaluation, Medication Management.  Future Appointments  Date Time Provider [place]  01/30/2018  3:40 PM Margo Common, [place] [facility] None  02/05/2018  9:45 AM Gillis Santa, MD [facility] None  11/06/2018  2:00 PM [facility]SE HEALTH ADVISOR [facility] None   Primary Care Physician: Margo Common, PA Location: [facility] Note by: Gillis Santa, MD Date: 01/07/2018; Time: 2:56 PM  Disclaimer:  Medicine is not an exact science. The only guarantee in medicine is that nothing is guaranteed. It is important to note that the decision to proceed with this intervention was based on the information collected from the patient. The Data and conclusions were drawn from the patient's questionnaire, the interview, and the physical examination. Because the information was provided in large part by the patient, it cannot be guaranteed that it has not been purposely or unconsciously manipulated. Every effort has been made to obtain as much relevant data as possible for this evaluation. It is important to note that the conclusions that lead to this procedure are derived in large part from the available data. Always take into account that the treatment will also be dependent on availability of resources and existing treatment guidelines, considered by other Pain Management Practitioners as being common knowledge and practice, at the time of the intervention. For  Medico-Legal purposes, it is also important to point out that variation in procedural techniques and pharmacological choices are the acceptable norm. The indications, contraindications, technique, and results of the above procedure should only be interpreted and judged by a Board-Certified Interventional Pain Specialist with extensive familiarity and expertise in the same exact procedure and technique.

## 2018-01-07 NOTE — Progress Notes (Signed)
Safety precautions to be maintained throughout the outpatient stay will include: orient to surroundings, keep bed in low position, maintain call bell within reach at all times, provide assistance with transfer out of bed and ambulation.  

## 2018-01-07 NOTE — Patient Instructions (Signed)

## 2018-01-08 ENCOUNTER — Encounter: Payer: Self-pay | Admitting: Student

## 2018-01-08 ENCOUNTER — Telehealth: Payer: Self-pay

## 2018-01-08 NOTE — Telephone Encounter (Signed)
Patient verbalized no concerns or questions following procedure.

## 2018-01-09 ENCOUNTER — Ambulatory Visit
Admission: RE | Admit: 2018-01-09 | Discharge: 2018-01-09 | Disposition: A | Discharge status: Home / Self Care | Payer: Medicare Other | Source: Ambulatory Visit | Attending: Gastroenterology | Admitting: Gastroenterology

## 2018-01-09 ENCOUNTER — Ambulatory Visit: Payer: Medicare Other | Admitting: Anesthesiology

## 2018-01-09 ENCOUNTER — Encounter
Admission: RE | Disposition: A | Discharge status: Home / Self Care | Payer: Self-pay | Source: Ambulatory Visit | Attending: Gastroenterology

## 2018-01-09 ENCOUNTER — Encounter: Payer: Self-pay | Admitting: Anesthesiology

## 2018-01-09 DIAGNOSIS — E119 Type 2 diabetes mellitus without complications: Secondary | ICD-10-CM | POA: Diagnosis not present

## 2018-01-09 DIAGNOSIS — E7800 Pure hypercholesterolemia, unspecified: Secondary | ICD-10-CM | POA: Insufficient documentation

## 2018-01-09 DIAGNOSIS — K648 Other hemorrhoids: Secondary | ICD-10-CM

## 2018-01-09 DIAGNOSIS — Z79899 Other long term (current) drug therapy: Secondary | ICD-10-CM | POA: Diagnosis not present

## 2018-01-09 DIAGNOSIS — D124 Benign neoplasm of descending colon: Secondary | ICD-10-CM | POA: Diagnosis not present

## 2018-01-09 DIAGNOSIS — K5790 Diverticulosis of intestine, part unspecified, without perforation or abscess without bleeding: Secondary | ICD-10-CM | POA: Diagnosis not present

## 2018-01-09 DIAGNOSIS — Z7984 Long term (current) use of oral hypoglycemic drugs: Secondary | ICD-10-CM | POA: Diagnosis not present

## 2018-01-09 DIAGNOSIS — D120 Benign neoplasm of cecum: Secondary | ICD-10-CM

## 2018-01-09 DIAGNOSIS — I10 Essential (primary) hypertension: Secondary | ICD-10-CM | POA: Insufficient documentation

## 2018-01-09 DIAGNOSIS — Z1211 Encounter for screening for malignant neoplasm of colon: Secondary | ICD-10-CM

## 2018-01-09 DIAGNOSIS — Z8614 Personal history of Methicillin resistant Staphylococcus aureus infection: Secondary | ICD-10-CM | POA: Insufficient documentation

## 2018-01-09 DIAGNOSIS — D122 Benign neoplasm of ascending colon: Secondary | ICD-10-CM | POA: Diagnosis not present

## 2018-01-09 DIAGNOSIS — K635 Polyp of colon: Secondary | ICD-10-CM | POA: Diagnosis not present

## 2018-01-09 HISTORY — PX: COLONOSCOPY WITH PROPOFOL: SHX5780

## 2018-01-09 LAB — GLUCOSE, CAPILLARY: Glucose-Capillary: 117 mg/dL — ABNORMAL HIGH (ref 70–99)

## 2018-01-09 SURGERY — COLONOSCOPY WITH PROPOFOL
Anesthesia: General

## 2018-01-09 MED ORDER — PROPOFOL 500 MG/50ML IV EMUL
INTRAVENOUS | Status: AC
  Filled 2018-01-09: qty 50

## 2018-01-09 MED ORDER — PROPOFOL 10 MG/ML IV BOLUS
INTRAVENOUS | Status: DC | PRN
  Administered 2018-01-09: 100 mg via INTRAVENOUS

## 2018-01-09 MED ORDER — PROPOFOL 500 MG/50ML IV EMUL
INTRAVENOUS | Status: DC | PRN
  Administered 2018-01-09: 140 ug/kg/min via INTRAVENOUS

## 2018-01-09 MED ORDER — SODIUM CHLORIDE 0.9 % IV SOLN
INTRAVENOUS | Status: DC
  Administered 2018-01-09: 1000 mL via INTRAVENOUS

## 2018-01-09 NOTE — H&P (Signed)
Emily Antigua, MD [address], [place], [place], [place], [address] [address], [place], [place], [place], [address] Phone: [phone]  Fax: [phone]  Primary Care Physician:  Margo Common, PA   Pre-Procedure History & Physical: HPI:  Emily Stokes is a 57 y.o. female is here for a colonoscopy.   Past Medical History:  Diagnosis Date  . Abdominal pain, epigastric   . Anemia   . Backache   . Bronchitis   . Calculus, kidney   . Carpal tunnel syndrome   . Chest pain   . Chicken pox   . Circulatory disease   . Disorder of kidney and ureter   . Excess, menstruation   . Hypercholesteremia   . Hypertension, essential, benign   . Infected postoperative seroma   . Iron deficiency   . Malaise and fatigue   . Measles   . MRSA (methicillin resistant staph aureus) culture positive   . Nausea alone   . Nonspecific abnormal electrocardiogram (ECG) (EKG)   . Pain in joint, lower leg     Past Surgical History:  Procedure Laterality Date  . ABDOMINAL HYSTERECTOMY    . CESAREAN SECTION    . COLONOSCOPY  1997  . COLOSTOMY TAKEDOWN  2012  . Hartmann's procedure.    Marland Kitchen HERNIA REPAIR  6295   umbilical  . LITHOTRIPSY  2841   with complications, hospitalized due to these complications for 3 months  . mrsa     removal on nose  . PARTIAL HYSTERECTOMY  2009    vaginal hysterectomy, has both ovaries    Prior to Admission medications   Medication Sig Start Date End Date Taking? Authorizing Provider  amLODipine (NORVASC) 10 MG tablet Take 1 tablet (10 mg total) by mouth daily. 08/21/17  Yes Chrismon, [name], PA  aspirin 81 MG tablet Take 81 mg by mouth daily.   Yes [provider]  atorvastatin (LIPITOR) 20 MG tablet TAKE 1 TABLET BY MOUTH  DAILY 12/08/17  Yes Birdie Sons, MD  diclofenac (VOLTAREN) 75 MG EC tablet Take 1 tablet (75 mg total) by mouth 2 (two) times daily after a meal. 12/23/17  Yes Lateef, Bilal, MD  glucose blood test strip Test  fasting blood sugar before breakfast daily. Recheck blood sugar if any symptoms of hypoglycemia. 08/25/17  Yes Chrismon, [name], PA  metFORMIN (GLUCOPHAGE) 500 MG tablet Take 1 tablet (500 mg total) by mouth 2 (two) times daily with a meal. 08/21/17  Yes Chrismon, [name], PA  metoprolol tartrate (LOPRESSOR) 25 MG tablet Take 1 tablet (25 mg total) by mouth 2 (two) times daily. 08/21/17  Yes Chrismon, [name], PA  ondansetron (ZOFRAN ODT) 8 MG disintegrating tablet Take 1 tablet (8 mg total) by mouth every 8 (eight) hours as needed for nausea or vomiting. 07/07/17  Yes Dorie Rank, MD  [facility] DELICA LANCETS FINE MISC Test fasting blood sugar each morning. 10/03/17  Yes Chrismon, [name], PA  pregabalin (LYRICA) 50 MG capsule Take 1 capsule (50 mg total) by mouth 3 (three) times daily. 12/23/17  Yes Gillis Santa, MD  tapentadol (NUCYNTA) 50 MG TABS tablet Take 50 mg by mouth every 6 (six) hours as needed. Reported on 11/27/2015   Yes [provider]    Allergies as of 11/28/2017  . (No Known Allergies)    Family History  Problem Relation Age of Onset  . Hypertension Father   . Stroke Father   . Diabetes Mother   .  Hypertension Mother   . Diabetes Other        sibling  . Diabetes Other        sibling  . Diabetes Other        sibling  . Hypertension Other        sibling  . Hypertension Other        sibling  . Hypertension Other        sibling    Social History   Socioeconomic History  . Marital status: Widowed    Spouse name: Not on file  . Number of children: 4  . Years of education: Not on file  . Highest education level: Bachelor's degree (e.g., BA, AB, BS)  Occupational History  . Occupation: Control and instrumentation engineer    Comment: disability  Social Needs  . Financial resource strain: Somewhat hard  . Food insecurity:    Worry: Never true    Inability: Never true  . Transportation needs:    Medical: No    Non-medical: No  Tobacco Use  . Smoking status: Never  Smoker  . Smokeless tobacco: Never Used  Substance and Sexual Activity  . Alcohol use: No    Alcohol/week: 0.0 standard drinks  . Drug use: No  . Sexual activity: Never  Lifestyle  . Physical activity:    Days per week: Not on file    Minutes per session: Not on file  . Stress: Very much  Relationships  . Social connections:    Talks on phone: Not on file    Gets together: Not on file    Attends religious service: Not on file    Active member of club or organization: Not on file    Attends meetings of clubs or organizations: Not on file    Relationship status: Not on file  . Intimate partner violence:    Fear of current or ex partner: Not on file    Emotionally abused: Not on file    Physically abused: Not on file    Forced sexual activity: Not on file  Other Topics Concern  . Not on file  Social History Narrative  . Not on file    Review of Systems: See HPI, otherwise negative ROS  Physical Exam: BP 120/78   Pulse 65   Temp (!) 96.2 F (35.7 C) (Tympanic)   Resp 20   Ht 5\' 9"  (1.753 m)   Wt 108 kg   SpO2 97%   BMI 35.15 kg/m  General:   Alert,  pleasant and cooperative in NAD Head:  Normocephalic and atraumatic. Neck:  Supple; no masses or thyromegaly. Lungs:  Clear throughout to auscultation, normal respiratory effort.    Heart:  +S1, +S2, Regular rate and rhythm, No edema. Abdomen:  Soft, nontender and nondistended. Normal bowel sounds, without guarding, and without rebound.   Neurologic:  Alert and  oriented x4;  grossly normal neurologically.  Impression/Plan: Emily Stokes is here for a colonoscopy to be performed for average risk screening.  Risks, benefits, limitations, and alternatives regarding  colonoscopy have been reviewed with the patient.  Questions have been answered.  All parties agreeable.   Virgel Manifold, MD  01/09/2018, 9:55 AM

## 2018-01-09 NOTE — Anesthesia Post-op Follow-up Note (Signed)
Anesthesia QCDR form completed.        

## 2018-01-09 NOTE — Anesthesia Postprocedure Evaluation (Signed)
Anesthesia Post Note  Patient: Emily Stokes  Procedure(s) Performed: COLONOSCOPY WITH PROPOFOL (N/A )  Patient location during evaluation: Endoscopy Anesthesia Type: General Level of consciousness: awake and alert Pain management: pain level controlled Vital Signs Assessment: post-procedure vital signs reviewed and stable Respiratory status: spontaneous breathing, nonlabored ventilation, respiratory function stable and patient connected to nasal cannula oxygen Cardiovascular status: blood pressure returned to baseline and stable Postop Assessment: no apparent nausea or vomiting Anesthetic complications: no     Last Vitals:  Vitals:   01/09/18 1046 01/09/18 1056  BP: 122/72 131/72  Pulse:    Resp:    Temp:    SpO2:      Last Pain:  Vitals:   01/09/18 1056  TempSrc:   PainSc: 0-No pain                 Precious Haws Zareya Tuckett

## 2018-01-09 NOTE — Op Note (Signed)
[facility]astroenterology Patient Name: Emily Stokes Procedure Date: 01/09/2018 9:53 AM MRN: [id number] Account #: [id number] Date of Birth: [birth year] Admit Type: Outpatient Age: 57 Room: [facility] ENDO ROOM 2 Gender: Female Note Status: Finalized Procedure:            Colonoscopy of Post-surgical Anatomy Indications:          Screening for colorectal malignant neoplasm Providers:            Jashan Cotten B. Bonna Gains MD, MD Referring MD:         [name]. Chrismon, MD (Referring MD) Medicines:            Monitored Anesthesia Care Complications:        No immediate complications. Procedure:            Pre-Anesthesia Assessment:                       - ASA Grade Assessment: II - A patient with mild                        systemic disease.                       - Prior to the procedure, a History and Physical was                        performed, and patient medications, allergies and                        sensitivities were reviewed. The patient's tolerance of                        previous anesthesia was reviewed.                       - The risks and benefits of the procedure and the                        sedation options and risks were discussed with the                        patient. All questions were answered and informed                        consent was obtained.                       - Patient identification and proposed procedure were                        verified prior to the procedure by the physician, the                        nurse, the anesthesiologist, the anesthetist and the                        technician. The procedure was verified in the procedure                        room.  After obtaining informed consent, the endoscope was                        passed under direct vision. Throughout the procedure,                        the patient's blood pressure, pulse, and oxygen                        saturations were  monitored continuously. The                        Colonoscope was introduced through the anus and                        advanced to the the cecum, identified by appendiceal                        orifice and ileocecal valve. After obtaining informed                        consent, the endoscope was passed under direct vision.                        Throughout the procedure, the patient's blood pressure,                        pulse, and oxygen saturations were monitored                        continuously.The colonoscopy was performed with ease.                        The patient tolerated the procedure well. The quality                        of the bowel preparation was fair. Findings:      The perianal and digital rectal examinations were normal.      Two sessile polyps were found in the descending colon and cecum. The       polyps were 2 to 4 mm in size. These polyps were removed with a cold       biopsy forceps. Resection and retrieval were complete.      Patient is status-post colon resection with a surgical anastomosis.      The exam was otherwise without abnormality.      The rectum, sigmoid colon, descending colon, transverse colon, ascending       colon and cecum appeared normal.      The retroflexed view of the distal rectum and anal verge was normal and       showed no anal or rectal abnormalities.      Internal hemorrhoids were found during retroflexion. Impression:           - Preparation of the colon was fair.                       - Two 2 to 4 mm polyps in the descending colon and in                        the cecum,  removed with a cold biopsy forceps. Resected                        and retrieved.                       - The examination was otherwise normal.                       - The rectum, sigmoid colon, descending colon,                        transverse colon, ascending colon and cecum are normal.                       - The distal rectum and anal verge are  normal on                        retroflexion view.                       - Internal hemorrhoids. Recommendation:       - Discharge patient to home (with escort).                       - Advance diet as tolerated.                       - Continue present medications.                       - Await pathology results.                       - Repeat colonoscopy in 1 year with 2 day prep due to                        fair prep on today's exam.                       - The findings and recommendations were discussed with                        the patient.                       - The findings and recommendations were discussed with                        the patient's family.                       - Return to primary care physician as previously                        scheduled.                       - High fiber diet. Procedure Code(s):    --- Professional ---                       [phone], Colonoscopy, flexible; with biopsy, single or  multiple Diagnosis Code(s):    --- Professional ---                       Z12.11, Encounter for screening for malignant neoplasm                        of colon                       K64.8, Other hemorrhoids                       D12.4, Benign neoplasm of descending colon                       D12.0, Benign neoplasm of cecum CPT copyright 2017 [employer]. All rights reserved. The codes documented in this report are preliminary and upon coder review may  be revised to meet current compliance requirements.  [name], MD Margretta Sidle B. Bonna Gains MD, MD 01/09/2018 10:33:11 AM This report has been signed electronically. Number of Addenda: 0 Note Initiated On: 01/09/2018 9:53 AM Scope Withdrawal Time: 0 hours 20 minutes 1 second  Total Procedure Duration: 0 hours 23 minutes 36 seconds  Estimated Blood Loss: Estimated blood loss: none.      [facility]

## 2018-01-09 NOTE — Transfer of Care (Signed)
Immediate Anesthesia Transfer of Care Note  Patient: Emily Stokes  Procedure(s) Performed: COLONOSCOPY WITH PROPOFOL (N/A )  Patient Location: Endoscopy Unit  Anesthesia Type:General  Level of Consciousness: sedated  Airway & Oxygen Therapy: Patient Spontanous Breathing and Patient connected to nasal cannula oxygen  Post-op Assessment: Report given to RN and Post -op Vital signs reviewed and stable  Post vital signs: Reviewed and stable  Last Vitals:  Vitals Value Taken Time  BP    Temp    Pulse 68 01/09/2018 10:25 AM  Resp    SpO2 95 % 01/09/2018 10:25 AM  Vitals shown include unvalidated device data.  Last Pain:  Vitals:   01/09/18 0836  TempSrc: Tympanic  PainSc: 0-No pain         Complications: No apparent anesthesia complications

## 2018-01-09 NOTE — Anesthesia Preprocedure Evaluation (Signed)
Anesthesia Evaluation  Patient identified by MRN, date of birth, ID band Patient awake    Reviewed: Allergy & Precautions, H&P , NPO status , Patient's Chart, lab work & pertinent test results  History of Anesthesia Complications Negative for: history of anesthetic complications  Airway Mallampati: III  TM Distance: >3 FB Neck ROM: full    Dental  (+) Chipped, Poor Dentition   Pulmonary neg pulmonary ROS, neg shortness of breath,           Cardiovascular Exercise Tolerance: Good hypertension, (-) angina+ Peripheral Vascular Disease  (-) Past MI and (-) DOE      Neuro/Psych  Neuromuscular disease negative psych ROS   GI/Hepatic negative GI ROS, Neg liver ROS, neg GERD  ,  Endo/Other  diabetes, Type 2  Renal/GU Renal disease  negative genitourinary   Musculoskeletal  (+) Arthritis ,   Abdominal   Peds  Hematology negative hematology ROS (+)   Anesthesia Other Findings Past Medical History: No date: Abdominal pain, epigastric No date: Anemia No date: Backache No date: Bronchitis No date: Calculus, kidney No date: Carpal tunnel syndrome No date: Chest pain No date: Chicken pox No date: Circulatory disease No date: Disorder of kidney and ureter No date: Excess, menstruation No date: Hypercholesteremia No date: Hypertension, essential, benign No date: Infected postoperative seroma No date: Iron deficiency No date: Malaise and fatigue No date: Measles No date: MRSA (methicillin resistant staph aureus) culture positive No date: Nausea alone No date: Nonspecific abnormal electrocardiogram (ECG) (EKG) No date: Pain in joint, lower leg  Past Surgical History: No date: ABDOMINAL HYSTERECTOMY No date: CESAREAN SECTION 1997: COLONOSCOPY 2012: COLOSTOMY TAKEDOWN No date: Hartmann's procedure. 2012: HERNIA REPAIR     Comment:  umbilical 5638: LITHOTRIPSY     Comment:  with complications, hospitalized due to  these               complications for 3 months No date: mrsa     Comment:  removal on nose 2009: PARTIAL HYSTERECTOMY     Comment:   vaginal hysterectomy, has both ovaries  BMI    Body Mass Index:  35.15 kg/m      Reproductive/Obstetrics negative OB ROS                             Anesthesia Physical Anesthesia Plan  ASA: III  Anesthesia Plan: General   Post-op Pain Management:    Induction: Intravenous  PONV Risk Score and Plan: Propofol infusion and TIVA  Airway Management Planned: Natural Airway and Nasal Cannula  Additional Equipment:   Intra-op Plan:   Post-operative Plan:   Informed Consent: I have reviewed the patients History and Physical, chart, labs and discussed the procedure including the risks, benefits and alternatives for the proposed anesthesia with the patient or authorized representative who has indicated his/her understanding and acceptance.   Dental Advisory Given  Plan Discussed with: Anesthesiologist, CRNA and Surgeon  Anesthesia Plan Comments: (Patient consented for risks of anesthesia including but not limited to:  - adverse reactions to medications - risk of intubation if required - damage to teeth, lips or other oral mucosa - sore throat or hoarseness - Damage to heart, brain, lungs or loss of life  Patient voiced understanding.)        Anesthesia Quick Evaluation

## 2018-01-13 ENCOUNTER — Encounter: Payer: Self-pay | Admitting: Gastroenterology

## 2018-01-13 LAB — SURGICAL PATHOLOGY

## 2018-01-19 ENCOUNTER — Encounter: Payer: Self-pay | Admitting: Gastroenterology

## 2018-01-30 ENCOUNTER — Encounter: Payer: Self-pay | Admitting: Family Medicine

## 2018-01-30 ENCOUNTER — Ambulatory Visit (INDEPENDENT_AMBULATORY_CARE_PROVIDER_SITE_OTHER): Payer: Medicare Other | Admitting: Family Medicine

## 2018-01-30 VITALS — BP 108/62 | HR 70 | Temp 98.6°F | Resp 18 | Wt 231.0 lb

## 2018-01-30 DIAGNOSIS — E7800 Pure hypercholesterolemia, unspecified: Secondary | ICD-10-CM

## 2018-01-30 DIAGNOSIS — Z23 Encounter for immunization: Secondary | ICD-10-CM | POA: Diagnosis not present

## 2018-01-30 DIAGNOSIS — I10 Essential (primary) hypertension: Secondary | ICD-10-CM | POA: Diagnosis not present

## 2018-01-30 DIAGNOSIS — G894 Chronic pain syndrome: Secondary | ICD-10-CM

## 2018-01-30 DIAGNOSIS — E119 Type 2 diabetes mellitus without complications: Secondary | ICD-10-CM

## 2018-01-30 LAB — POCT UA - MICROALBUMIN: Microalbumin Ur, POC: 20 mg/L

## 2018-01-30 LAB — POCT GLYCOSYLATED HEMOGLOBIN (HGB A1C): Hemoglobin A1C: 6.8 % — AB (ref 4.0–5.6)

## 2018-01-30 NOTE — Progress Notes (Signed)
Patient: Emily Stokes Female    DOB: 09-Sep-1960   57 y.o.   MRN: [id number] Visit Date: 01/30/2018  Today's Provider: [name], PA   Chief Complaint  Patient presents with  . Hyperlipidemia  . Hypertension  . Diabetes   Subjective:    Diabetes  She presents for her follow-up diabetic visit. She has type 2 diabetes mellitus. Her disease course has been stable. There are no hypoglycemic associated symptoms. Pertinent negatives for hypoglycemia include no headaches or sweats. Associated symptoms include blurred vision. Pertinent negatives for diabetes include no chest pain, no fatigue, no foot paresthesias, no foot ulcerations, no polydipsia, no polyphagia, no polyuria, no visual change, no weakness and no weight loss. There are no hypoglycemic complications. Symptoms are stable. There are no diabetic complications. Risk factors for coronary artery disease include diabetes mellitus and hypertension. She is compliant with treatment all of the time. She is following a diabetic and generally healthy diet. She rarely participates in exercise. Home blood sugar record trend: Fasting sugars in the low to mid 100's. An ACE inhibitor/angiotensin II receptor blocker is not being taken. She does not see a podiatrist.Eye exam is not current.  Hypertension  This is a chronic problem. The problem is unchanged. The problem is controlled. Associated symptoms include blurred vision. Pertinent negatives include no anxiety, chest pain, headaches, malaise/fatigue, neck pain, orthopnea, palpitations, peripheral edema, shortness of breath or sweats. Risk factors for coronary artery disease include diabetes mellitus and dyslipidemia. There are no compliance problems.   Hyperlipidemia  This is a chronic problem. The problem is controlled. Recent lipid tests were reviewed and are normal. Pertinent negatives include no chest pain or shortness of breath. Current antihyperlipidemic treatment includes statins.  There are no compliance problems.    Lab Results  Component Value Date   HGBA1C 7.1 (A) 10/03/2017   Lab Results  Component Value Date   CHOL 204 (H) 10/17/2017   Lab Results  Component Value Date   HDL 40 10/17/2017   Lab Results  Component Value Date   LDLCALC 144 (H) 10/17/2017   Lab Results  Component Value Date   TRIG 99 10/17/2017   Lab Results  Component Value Date   CHOLHDL 5.1 (H) 10/17/2017   No results found for: LDLDIRECT BP Readings from Last 3 Encounters:  01/09/18 131/72  01/07/18 120/70  12/23/17 128/70   Wt Readings from Last 3 Encounters:  01/09/18 238 lb (108 kg)  01/07/18 230 lb (104.3 kg)  12/23/17 240 lb (108.9 kg)   Patient Active Problem List   Diagnosis Date Noted  . Encounter for screening colonoscopy   . Internal hemorrhoids   . Benign neoplasm of descending colon   . Benign neoplasm of cecum   . Type 2 diabetes mellitus without complication, without long-term current use of insulin ([place]) 07/31/2017  . Recurrent abdominal hernia without obstruction or gangrene 07/24/2017  . Cellulitis of face 05/30/2016  . Chronic pain syndrome 05/30/2016  . Nasal septal abscess 05/30/2016  . Pain in elbow 02/05/2016  . DDD (degenerative disc disease), lumbar 09/14/2015  . Facet syndrome, lumbar 09/14/2015  . Lumbar radiculopathy 09/14/2015  . Sacroiliac joint dysfunction 09/14/2015  . History of chicken pox 02/02/2015  . Leg pain 02/02/2015  . Hypercholesteremia 02/02/2015  . Blood glucose elevated 02/02/2015  . Pain in soft tissues of limb 02/02/2015  . Nonspecific ST-T changes 02/02/2015  . Awareness of heartbeats 02/02/2015  . Pain in extremity at multiple  sites 02/02/2015  . Snores 02/02/2015  . Hernia of anterior abdominal wall 02/24/2012  . Pain in shoulder 12/05/2011  . Adnexal mass 10/30/2011  . Pelvic cyst 10/30/2011  . Palpitations 03/14/2011  . HTN (hypertension) 03/14/2011  . Abnormal finding on EKG 03/14/2011  . Obesity  03/14/2011  . Infected surgical wound 09/15/2008  . Urinary system disease 08/10/2008  . Benign essential HTN 07/12/2008  . Arthralgia of lower leg 03/28/2008  . Anemia, iron deficiency 06/17/2007  . Calculus of kidney 05/26/2007        No Known Allergies  Current Outpatient Medications:  .  amLODipine (NORVASC) 10 MG tablet, Take 1 tablet (10 mg total) by mouth daily., Disp: 90 tablet, Rfl: 3 .  aspirin 81 MG tablet, Take 81 mg by mouth daily., Disp: , Rfl:  .  atorvastatin (LIPITOR) 20 MG tablet, TAKE 1 TABLET BY MOUTH  DAILY, Disp: 90 tablet, Rfl: 1 .  diclofenac (VOLTAREN) 75 MG EC tablet, Take 1 tablet (75 mg total) by mouth 2 (two) times daily after a meal., Disp: 60 tablet, Rfl: 0 .  glucose blood test strip, Test fasting blood sugar before breakfast daily. Recheck blood sugar if any symptoms of hypoglycemia., Disp: 100 each, Rfl: 4 .  metFORMIN (GLUCOPHAGE) 500 MG tablet, Take 1 tablet (500 mg total) by mouth 2 (two) times daily with a meal., Disp: 180 tablet, Rfl: 3 .  metoprolol tartrate (LOPRESSOR) 25 MG tablet, Take 1 tablet (25 mg total) by mouth 2 (two) times daily., Disp: 180 tablet, Rfl: 3 .  ondansetron (ZOFRAN ODT) 8 MG disintegrating tablet, Take 1 tablet (8 mg total) by mouth every 8 (eight) hours as needed for nausea or vomiting., Disp: 20 tablet, Rfl: 0 .  ONETOUCH DELICA LANCETS FINE MISC, Test fasting blood sugar each morning., Disp: 100 each, Rfl: 4 .  pregabalin (LYRICA) 50 MG capsule, Take 1 capsule (50 mg total) by mouth 3 (three) times daily., Disp: 90 capsule, Rfl: 1 .  tapentadol (NUCYNTA) 50 MG TABS tablet, Take 50 mg by mouth every 6 (six) hours as needed. Reported on 11/27/2015, Disp: , Rfl:   Current Facility-Administered Medications:  .  sodium chloride flush (NS) 0.9 % injection 20 mL, 20 mL, Other, Once, Mohammed Kindle, MD .  triamcinolone acetonide (KENALOG-40) injection 40 mg, 40 mg, Other, Once, Mohammed Kindle, MD  Review of Systems    Constitutional: Negative for fatigue, malaise/fatigue and weight loss.  Eyes: Positive for blurred vision.  Respiratory: Negative for shortness of breath.   Cardiovascular: Negative for chest pain, palpitations and orthopnea.  Endocrine: Negative for polydipsia, polyphagia and polyuria.  Musculoskeletal: Negative for neck pain.  Neurological: Negative for weakness and headaches.   Social History   Tobacco Use  . Smoking status: Never Smoker  . Smokeless tobacco: Never Used  Substance Use Topics  . Alcohol use: No    Alcohol/week: 0.0 standard drinks   Objective:   BP 108/62 (BP Location: Right Arm, Patient Position: Sitting)   Pulse 70   Temp 98.6 F (37 C)   Resp 18   Wt 231 lb (104.8 kg)   SpO2 97%   BMI 34.11 kg/m  Vitals:   01/30/18 1643  BP: 108/62  Pulse: 70  Resp: 18  Temp: 98.6 F (37 C)  SpO2: 97%  Weight: 231 lb (104.8 kg)   Physical Exam  Constitutional: She is oriented to person, place, and time. She appears well-developed and well-nourished. No distress.  HENT:  Head: Normocephalic and  atraumatic.  Right Ear: Hearing normal.  Left Ear: Hearing normal.  Nose: Nose normal.  Eyes: Conjunctivae and lids are normal. Right eye exhibits no discharge. Left eye exhibits no discharge. No scleral icterus.  Cardiovascular: Normal rate and regular rhythm.  Pulmonary/Chest: Effort normal and breath sounds normal. No respiratory distress.  Musculoskeletal:  Chronic pain in the left leg and back region - unchanged. Good pulses throughout.   Neurological: She is alert and oriented to person, place, and time.  Skin: Skin is intact. No lesion and no rash noted.  Psychiatric: She has a normal mood and affect. Her speech is normal and behavior is normal. Thought content normal.   Diabetic Foot Form - Detailed   Diabetic Foot Exam - detailed Diabetic Foot exam was performed with the following findings:  Yes 01/30/2018  4:49 PM  Visual Foot Exam completed.:  Yes  Can  the patient see the bottom of their feet?:  Yes Are the shoes appropriate in style and fit?:  Yes Is there swelling or and abnormal foot shape?:  No Is there a claw toe deformity?:  No Is there elevated skin temparature?:  No Is there foot or ankle muscle weakness?:  No Normal Range of Motion:  Yes Pulse Foot Exam completed.:  Yes  Right posterior Tibialias:  Present Left posterior Tibialias:  Present  Right Dorsalis Pedis:  Present Left Dorsalis Pedis:  Present  Sensory Foot Exam Completed.:  Yes Semmes-Weinstein Monofilament Test R Site 1-Great Toe:  Pos L Site 1-Great Toe:  Pos          Assessment & Plan:     1. Essential hypertension Stable BP on the Metoprolol Tartrate 25 mg BID, and Amlodipine 10 mg qd. Will get routine labs and follow up pending reports. - CBC with Differential/Platelet - Comprehensive metabolic panel - Lipid panel - TSH  2. Type 2 diabetes mellitus without complication, without long-term current use of insulin ([facility]) FBS in the 90-130 range normally. Taking Metformin 500 mg BID without side effects. Hgb A1C 6.8% today and urine microalbumin 20 mg/L. Check routine labs. Recommend getting annual eye exam. - POCT glycosylated hemoglobin (Hb A1C) - POCT UA - Microalbumin - CBC with Differential/Platelet - Comprehensive metabolic panel - Lipid panel  3. Hypercholesteremia Tolerating Atorvastatin 20 mg qd and trying to follow a low fat diabetic diet. Recheck CMP, Lipid Panel and TSH. Follow up pending reports. - Comprehensive metabolic panel - Lipid panel - TSH  4. Need for Streptococcus pneumoniae vaccination - Pneumococcal polysaccharide vaccine 23-valent greater than or equal to 2yo subcutaneous/IM  5. Need for influenza vaccination - Flu Vaccine QUAD 6+ mos PF IM (Fluarix Quad PF)  6. Chronic pain syndrome Lumbar DDD, facet arthropathy with radiculopathy follow by Dr. Holley Raring (pain management). Having epidural steroid injections at lower lumbar  region. Presently on Nucynta 50 mg QED prn, Lyrica 50 mg TID and Diclofenac 75 mg BID. Continue follow up with Dr. Holley Raring.       [name], PA  [facility]

## 2018-02-04 DIAGNOSIS — R202 Paresthesia of skin: Secondary | ICD-10-CM | POA: Diagnosis not present

## 2018-02-04 DIAGNOSIS — I10 Essential (primary) hypertension: Secondary | ICD-10-CM | POA: Diagnosis not present

## 2018-02-04 DIAGNOSIS — R1031 Right lower quadrant pain: Secondary | ICD-10-CM | POA: Diagnosis not present

## 2018-02-04 DIAGNOSIS — K439 Ventral hernia without obstruction or gangrene: Secondary | ICD-10-CM | POA: Diagnosis not present

## 2018-02-04 DIAGNOSIS — R109 Unspecified abdominal pain: Secondary | ICD-10-CM | POA: Diagnosis not present

## 2018-02-04 DIAGNOSIS — N3000 Acute cystitis without hematuria: Secondary | ICD-10-CM | POA: Diagnosis not present

## 2018-02-04 DIAGNOSIS — R200 Anesthesia of skin: Secondary | ICD-10-CM | POA: Diagnosis not present

## 2018-02-04 DIAGNOSIS — R590 Localized enlarged lymph nodes: Secondary | ICD-10-CM | POA: Diagnosis not present

## 2018-02-05 ENCOUNTER — Ambulatory Visit: Payer: Medicare Other | Admitting: Student in an Organized Health Care Education/Training Program

## 2018-02-05 DIAGNOSIS — R590 Localized enlarged lymph nodes: Secondary | ICD-10-CM | POA: Diagnosis not present

## 2018-02-05 DIAGNOSIS — K439 Ventral hernia without obstruction or gangrene: Secondary | ICD-10-CM | POA: Diagnosis not present

## 2018-02-05 DIAGNOSIS — R109 Unspecified abdominal pain: Secondary | ICD-10-CM | POA: Diagnosis not present

## 2018-02-06 DIAGNOSIS — E119 Type 2 diabetes mellitus without complications: Secondary | ICD-10-CM | POA: Diagnosis not present

## 2018-02-06 DIAGNOSIS — E7800 Pure hypercholesterolemia, unspecified: Secondary | ICD-10-CM | POA: Diagnosis not present

## 2018-02-06 DIAGNOSIS — I10 Essential (primary) hypertension: Secondary | ICD-10-CM | POA: Diagnosis not present

## 2018-02-07 LAB — CBC WITH DIFFERENTIAL/PLATELET
Basophils Absolute: 0 10*3/uL (ref 0.0–0.2)
Basos: 0 %
EOS (ABSOLUTE): 0.2 10*3/uL (ref 0.0–0.4)
Eos: 2 %
Hematocrit: 36.3 % (ref 34.0–46.6)
Hemoglobin: 12.3 g/dL (ref 11.1–15.9)
Immature Grans (Abs): 0 10*3/uL (ref 0.0–0.1)
Immature Granulocytes: 0 %
Lymphocytes Absolute: 3.1 10*3/uL (ref 0.7–3.1)
Lymphs: 43 %
MCH: 31.5 pg (ref 26.6–33.0)
MCHC: 33.9 g/dL (ref 31.5–35.7)
MCV: 93 fL (ref 79–97)
Monocytes Absolute: 0.5 10*3/uL (ref 0.1–0.9)
Monocytes: 7 %
Neutrophils Absolute: 3.4 10*3/uL (ref 1.4–7.0)
Neutrophils: 48 %
Platelets: 331 10*3/uL (ref 150–450)
RBC: 3.91 x10E6/uL (ref 3.77–5.28)
RDW: 12.7 % (ref 12.3–15.4)
WBC: 7.2 10*3/uL (ref 3.4–10.8)

## 2018-02-07 LAB — COMPREHENSIVE METABOLIC PANEL
ALT: 15 IU/L (ref 0–32)
AST: 17 IU/L (ref 0–40)
Albumin/Globulin Ratio: 1.4 (ref 1.2–2.2)
Albumin: 4.2 g/dL (ref 3.5–5.5)
Alkaline Phosphatase: 117 IU/L (ref 39–117)
BUN/Creatinine Ratio: 20 (ref 9–23)
BUN: 24 mg/dL (ref 6–24)
Bilirubin Total: 0.3 mg/dL (ref 0.0–1.2)
CO2: 20 mmol/L (ref 20–29)
Calcium: 9.1 mg/dL (ref 8.7–10.2)
Chloride: 106 mmol/L (ref 96–106)
Creatinine, Ser: 1.23 mg/dL — ABNORMAL HIGH (ref 0.57–1.00)
GFR calc Af Amer: 56 mL/min/{1.73_m2} — ABNORMAL LOW (ref >59)
GFR calc non Af Amer: 48 mL/min/{1.73_m2} — ABNORMAL LOW (ref >59)
Globulin, Total: 3.1 g/dL (ref 1.5–4.5)
Glucose: 130 mg/dL — ABNORMAL HIGH (ref 65–99)
Potassium: 4.3 mmol/L (ref 3.5–5.2)
Sodium: 141 mmol/L (ref 134–144)
Total Protein: 7.3 g/dL (ref 6.0–8.5)

## 2018-02-07 LAB — TSH: TSH: 1.48 u[IU]/mL (ref 0.450–4.500)

## 2018-02-07 LAB — LIPID PANEL
Chol/HDL Ratio: 3.7 ratio (ref 0.0–4.4)
Cholesterol, Total: 136 mg/dL (ref 100–199)
HDL: 37 mg/dL — ABNORMAL LOW (ref >39)
LDL Calculated: 79 mg/dL (ref 0–99)
Triglycerides: 99 mg/dL (ref 0–149)
VLDL Cholesterol Cal: 20 mg/dL (ref 5–40)

## 2018-02-08 DIAGNOSIS — I10 Essential (primary) hypertension: Secondary | ICD-10-CM | POA: Diagnosis not present

## 2018-02-08 DIAGNOSIS — R200 Anesthesia of skin: Secondary | ICD-10-CM | POA: Diagnosis not present

## 2018-02-08 DIAGNOSIS — M5416 Radiculopathy, lumbar region: Secondary | ICD-10-CM | POA: Diagnosis not present

## 2018-02-08 DIAGNOSIS — M4316 Spondylolisthesis, lumbar region: Secondary | ICD-10-CM | POA: Diagnosis not present

## 2018-02-08 DIAGNOSIS — M545 Low back pain: Secondary | ICD-10-CM | POA: Diagnosis not present

## 2018-02-08 DIAGNOSIS — M1990 Unspecified osteoarthritis, unspecified site: Secondary | ICD-10-CM | POA: Diagnosis not present

## 2018-02-08 DIAGNOSIS — Z79899 Other long term (current) drug therapy: Secondary | ICD-10-CM | POA: Diagnosis not present

## 2018-02-08 DIAGNOSIS — M5136 Other intervertebral disc degeneration, lumbar region: Secondary | ICD-10-CM | POA: Diagnosis not present

## 2018-02-08 DIAGNOSIS — Z7982 Long term (current) use of aspirin: Secondary | ICD-10-CM | POA: Diagnosis not present

## 2018-02-09 ENCOUNTER — Telehealth: Payer: Self-pay

## 2018-02-09 NOTE — Telephone Encounter (Signed)
-----   Message from Margo Common, [place] sent at 02/08/2018  9:31 PM EDT ----- Blood tests essentially shows pretty good diabetes control except urine microalbumen, creatinine and GFR indicates kidney function strain. Need extra water in diet to flush kidneys. Recheck BP and labs in 3 months.

## 2018-02-09 NOTE — Telephone Encounter (Signed)
Pt advised.  She reports she went to the ER for an UTI.  I made an ER follow up for 02/17/2018.  Thanks,   -Mickel Baas

## 2018-02-12 ENCOUNTER — Encounter: Payer: Self-pay | Admitting: Student in an Organized Health Care Education/Training Program

## 2018-02-12 ENCOUNTER — Ambulatory Visit
Payer: Medicare Other | Attending: Student in an Organized Health Care Education/Training Program | Admitting: Student in an Organized Health Care Education/Training Program

## 2018-02-12 ENCOUNTER — Other Ambulatory Visit: Payer: Self-pay

## 2018-02-12 VITALS — BP 134/109 | HR 77 | Temp 98.7°F | Resp 16 | Ht 69.0 in | Wt 240.0 lb

## 2018-02-12 DIAGNOSIS — N200 Calculus of kidney: Secondary | ICD-10-CM | POA: Insufficient documentation

## 2018-02-12 DIAGNOSIS — G894 Chronic pain syndrome: Secondary | ICD-10-CM | POA: Diagnosis not present

## 2018-02-12 DIAGNOSIS — D120 Benign neoplasm of cecum: Secondary | ICD-10-CM | POA: Diagnosis not present

## 2018-02-12 DIAGNOSIS — M5416 Radiculopathy, lumbar region: Secondary | ICD-10-CM

## 2018-02-12 DIAGNOSIS — M1288 Other specific arthropathies, not elsewhere classified, other specified site: Secondary | ICD-10-CM | POA: Diagnosis not present

## 2018-02-12 DIAGNOSIS — K648 Other hemorrhoids: Secondary | ICD-10-CM | POA: Insufficient documentation

## 2018-02-12 DIAGNOSIS — Z79899 Other long term (current) drug therapy: Secondary | ICD-10-CM | POA: Insufficient documentation

## 2018-02-12 DIAGNOSIS — M5136 Other intervertebral disc degeneration, lumbar region: Secondary | ICD-10-CM | POA: Diagnosis not present

## 2018-02-12 DIAGNOSIS — I10 Essential (primary) hypertension: Secondary | ICD-10-CM | POA: Insufficient documentation

## 2018-02-12 DIAGNOSIS — Z7982 Long term (current) use of aspirin: Secondary | ICD-10-CM | POA: Insufficient documentation

## 2018-02-12 DIAGNOSIS — M5116 Intervertebral disc disorders with radiculopathy, lumbar region: Secondary | ICD-10-CM | POA: Insufficient documentation

## 2018-02-12 DIAGNOSIS — E669 Obesity, unspecified: Secondary | ICD-10-CM | POA: Diagnosis not present

## 2018-02-12 DIAGNOSIS — Z5181 Encounter for therapeutic drug level monitoring: Secondary | ICD-10-CM | POA: Insufficient documentation

## 2018-02-12 DIAGNOSIS — D509 Iron deficiency anemia, unspecified: Secondary | ICD-10-CM | POA: Insufficient documentation

## 2018-02-12 DIAGNOSIS — M4726 Other spondylosis with radiculopathy, lumbar region: Secondary | ICD-10-CM | POA: Insufficient documentation

## 2018-02-12 DIAGNOSIS — E7800 Pure hypercholesterolemia, unspecified: Secondary | ICD-10-CM | POA: Insufficient documentation

## 2018-02-12 DIAGNOSIS — Z7984 Long term (current) use of oral hypoglycemic drugs: Secondary | ICD-10-CM | POA: Insufficient documentation

## 2018-02-12 DIAGNOSIS — Z6835 Body mass index (BMI) 35.0-35.9, adult: Secondary | ICD-10-CM | POA: Insufficient documentation

## 2018-02-12 DIAGNOSIS — M47816 Spondylosis without myelopathy or radiculopathy, lumbar region: Secondary | ICD-10-CM

## 2018-02-12 DIAGNOSIS — K439 Ventral hernia without obstruction or gangrene: Secondary | ICD-10-CM | POA: Insufficient documentation

## 2018-02-12 DIAGNOSIS — Z8249 Family history of ischemic heart disease and other diseases of the circulatory system: Secondary | ICD-10-CM | POA: Diagnosis not present

## 2018-02-12 DIAGNOSIS — E119 Type 2 diabetes mellitus without complications: Secondary | ICD-10-CM | POA: Insufficient documentation

## 2018-02-12 MED ORDER — PREGABALIN 50 MG PO CAPS: 50.0000 mg | ORAL_CAPSULE | Freq: Three times a day (TID) | ORAL | 1 refills | Status: DC

## 2018-02-12 MED ORDER — BUPRENORPHINE 7.5 MCG/HR TD PTWK: 7.5000 ug/h | MEDICATED_PATCH | TRANSDERMAL | 0 refills | Status: DC

## 2018-02-12 NOTE — Progress Notes (Signed)
Patient's Name: Emily Stokes  MRN: [id number]  Referring Provider: Margo Common, PA  DOB: 1961-01-08  PCP: Margo Common, PA  DOS: 02/12/2018  Note by: Gillis Santa, MD  Service setting: Ambulatory outpatient  Specialty: Interventional Pain Management  Location: [facility] (AMB) Pain Management Facility    Patient type: Established   Primary Reason(s) for Visit: Encounter for prescription drug management & post-procedure evaluation of chronic illness with mild to moderate exacerbation(Level of risk: moderate) CC: Back Pain (lower)  HPI  Emily Stokes is a 57 y.o. year old, female patient, who comes today for a post-procedure evaluation and medication management. She has Palpitations; HTN (hypertension); Abnormal finding on EKG; Obesity; Anemia, iron deficiency; Calculus of kidney; History of chicken pox; Leg pain; Urinary system disease; Hypercholesteremia; Blood glucose elevated; Benign essential HTN; Infected surgical wound; Pain in soft tissues of limb; Nonspecific ST-T changes; Arthralgia of lower leg; Awareness of heartbeats; Pain in extremity at multiple sites; Snores; Adnexal mass; Pain in shoulder; Hernia of anterior abdominal wall; DDD (degenerative disc disease), lumbar; Facet syndrome, lumbar; Lumbar radiculopathy; Sacroiliac joint dysfunction; Cellulitis of face; Chronic pain syndrome; Nasal septal abscess; Pelvic cyst; Pain in elbow; Recurrent abdominal hernia without obstruction or gangrene; Type 2 diabetes mellitus without complication, without long-term current use of insulin ([place]); Encounter for screening colonoscopy; Internal hemorrhoids; Benign neoplasm of descending colon; and Benign neoplasm of cecum on their problem list. Her primarily concern today is the Back Pain (lower)  Pain Assessment: Location: Lower Back Radiating: to left thigh Onset: More than a month ago Duration: Chronic pain Quality: Constant, Sharp, Heaviness, Discomfort, Numbness Severity: 8 /10 (subjective,  self-reported pain score)  Note: Reported level is inconsistent with clinical observations. Clinically the patient looks like a 2/10 A 2/10 is viewed as "Mild to Moderate" and described as noticeable and distracting. Impossible to hide from other people. More frequent flare-ups. Still possible to adapt and function close to normal. It can be very annoying and may have occasional stronger flare-ups. With discipline, patients may get used to it and adapt. Information on the proper use of the pain scale provided to the patient today. When using our objective Pain Scale, levels between 6 and 10/10 are said to belong in an emergency room, as it progressively worsens from a 6/10, described as severely limiting, requiring emergency care not usually available at an outpatient pain management facility. At a 6/10 level, communication becomes difficult and requires great effort. Assistance to reach the emergency department may be required. Facial flushing and profuse sweating along with potentially dangerous increases in heart rate and blood pressure will be evident. Effect on ADL: standing or sitting for long periods is painful Timing: Constant Modifying factors: nothing helps, "pain worse now than before procedure BP: (!) 134/109  HR: 77  Emily Stokes was last seen on 01/08/2018 for a procedure. During today's appointment we reviewed Emily Stokes's post-procedure results, as well as her outpatient medication regimen.  Further details on both, my assessment(s), as well as the proposed treatment plan, please see below.  Controlled Substance Pharmacotherapy Assessment REMS (Risk Evaluation and Mitigation Strategy)  Analgesic: N/A MME/day: 0 mg/day.  Rise Patience, RN  02/12/2018  1:43 PM  Sign at close encounter Safety precautions to be maintained throughout the outpatient stay will include: orient to surroundings, keep bed in low position, maintain call bell within reach at all times, provide assistance with  transfer out of bed and ambulation.     Monitoring: [place] PMP: Online review of  the past 23-monthperiod conducted. Compliant with practice rules and regulations Last UDS on record: Summary  Date Value Ref Range Status  12/23/2017 FINAL  Final    Comment:    ==================================================================== [facility] COMP DRUG ANALYSIS,UR ==================================================================== Test                             Result       Flag       Units Drug Present and Declared for Prescription Verification   Salicylate                     PRESENT      EXPECTED   Metoprolol                     PRESENT      EXPECTED Drug Present not Declared for Prescription Verification   Acetaminophen                  PRESENT      UNEXPECTED Drug Absent but Declared for Prescription Verification   Tapentadol                     Not Detected UNEXPECTED ng/mg creat   Pregabalin                     Not Detected UNEXPECTED   Diclofenac                     Not Detected UNEXPECTED    Diclofenac, as indicated in the declared medication list, is not    always detected even when used as directed. ==================================================================== Test                      Result    Flag   Units      Ref Range   Creatinine              169              mg/dL      >=20 ==================================================================== Declared Medications:  The flagging and interpretation on this report are based on the  following declared medications.  Unexpected results may arise from  inaccuracies in the declared medications.  **Note: The testing scope of this panel includes these medications:  Metoprolol (Lopressor)  Pregabalin (Lyrica)  Tapentadol (Nucynta)  **Note: The testing scope of this panel does not include small to  moderate amounts of these reported medications:  Aspirin (Aspirin 81)  Diclofenac  **Note: The testing scope of this panel  does not include following  reported medications:  Amlodipine (Norvasc)  Atorvastatin (Lipitor)  Metformin  Ondansetron (Zofran) ==================================================================== For clinical consultation, please call ([phone]====    UDS interpretation: Compliant          Medication Assessment Form: Not applicable. Initial evaluation. The patient has not received any medications from our practice Treatment compliance: Compliant Risk Assessment Profile: Aberrant behavior: See prior evaluations. None observed or detected today Comorbid factors increasing risk of overdose: See prior notes. No additional risks detected today Opioid risk tool (ORT) (Total Score): 0 Personal History of Substance Abuse (SUD-Substance use disorder):  Alcohol: Negative  Illegal Drugs: Negative  Rx Drugs: Negative  ORT Risk Level calculation: Low Risk Risk of substance use disorder (SUD): Low Opioid Risk Tool - 02/12/18 1342      Family History of Substance  Abuse   Alcohol  Negative    Illegal Drugs  Negative    Rx Drugs  Negative      Personal History of Substance Abuse   Alcohol  Negative    Illegal Drugs  Negative    Rx Drugs  Negative      Total Score   Opioid Risk Tool Scoring  0    Opioid Risk Interpretation  Low Risk      ORT Scoring interpretation table:  Score <3 = Low Risk for SUD  Score between 4-7 = Moderate Risk for SUD  Score >8 = High Risk for Opioid Abuse   Risk Mitigation Strategies:  Patient Counseling: Completed today. Counseling provided to patient as per "Patient Counseling Document". Document signed by patient, attesting to counseling and understanding Patient-Prescriber Agreement (PPA): Obtained today  Notification to other healthcare providers: Written and sent today  Pharmacologic Plan: Today we will take over the chronic pain medication management and from this point on our  medication agreement with this patient is active.             Post-Procedure Assessment  01/07/2018 Procedure left L4-L5 ESI Pre-procedure pain score:  8/10 Post-procedure pain score: 0/10         Influential Factors: BMI: 35.44 kg/m Intra-procedural challenges: None observed.         Assessment challenges: None detected.              Reported side-effects: None.        Post-procedural adverse reactions or complications: None reported         Sedation: Please see nurses note. When no sedatives are used, the analgesic levels obtained are directly associated to the effectiveness of the local anesthetics. However, when sedation is provided, the level of analgesia obtained during the initial 1 hour following the intervention, is believed to be the result of a combination of factors. These factors may include, but are not limited to: 1. The effectiveness of the local anesthetics used. 2. The effects of the analgesic(s) and/or anxiolytic(s) used. 3. The degree of discomfort experienced by the patient at the time of the procedure. 4. The patients ability and reliability in recalling and recording the events. 5. The presence and influence of possible secondary gains and/or psychosocial factors. Reported result: Relief experienced during the 1st hour after the procedure: 100 % (Ultra-Short Term Relief)            Interpretative annotation: Clinically appropriate result. Analgesia during this period is likely to be Local Anesthetic and/or IV Sedative (Analgesic/Anxiolytic) related.          Effects of local anesthetic: The analgesic effects attained during this period are directly associated to the localized infiltration of local anesthetics and therefore cary significant diagnostic value as to the etiological location, or anatomical origin, of the pain. Expected duration of relief is directly dependent on the pharmacodynamics of the local anesthetic used. Long-acting (4-6 hours) anesthetics used.   Reported result: Relief during the next 4 to 6 hour after the procedure: 100 % (Short-Term Relief)            Interpretative annotation: Clinically appropriate result. Analgesia during this period is likely to be Local Anesthetic-related.          Long-term benefit: Defined as the period of time past the expected duration of local anesthetics (1 hour for short-acting and 4-6 hours for long-acting). With the possible exception of prolonged sympathetic blockade from the local anesthetics, benefits during this period  are typically attributed to, or associated with, other factors such as analgesic sensory neuropraxia, antiinflammatory effects, or beneficial biochemical changes provided by agents other than the local anesthetics.  Reported result: Extended relief following procedure: 20 %(100% lasted 2 days) (Long-Term Relief)            Interpretative annotation: Clinically possible results. Good relief. No permanent benefit expected. Inflammation plays a part in the etiology to the pain.          Current benefits: Defined as reported results that persistent at this point in time.   Analgesia: <25 %            Function: Back to baseline ROM: Back to baseline Interpretative annotation: Recurrence of symptoms. Incomplete therapeutic success. Results would argue against repeating therapy.          Interpretation: Results would suggest failure of therapy in achieving desired goal(s).                  Plan:  Please see "Plan of Care" for details.                Laboratory Chemistry  Inflammation Markers (CRP: Acute Phase) (ESR: Chronic Phase) No results found for: CRP, ESRSEDRATE, LATICACIDVEN                       Rheumatology Markers No results found for: RF, ANA, LABURIC, URICUR, LYMEIGGIGMAB, LYMEABIGMQN, HLAB27                      Renal Function Markers Lab Results  Component Value Date   BUN 24 02/06/2018   CREATININE 1.23 (H) 02/06/2018   BCR 20 02/06/2018   GFRAA 56 (L) 02/06/2018    GFRNONAA 48 (L) 02/06/2018                             Hepatic Function Markers Lab Results  Component Value Date   AST 17 02/06/2018   ALT 15 02/06/2018   ALBUMIN 4.2 02/06/2018   ALKPHOS 117 02/06/2018   LIPASE 26 07/07/2017                        Electrolytes Lab Results  Component Value Date   NA 141 02/06/2018   K 4.3 02/06/2018   CL 106 02/06/2018   CALCIUM 9.1 02/06/2018                        Neuropathy Markers Lab Results  Component Value Date   HGBA1C 6.8 (A) 01/30/2018   HIV Non Reactive 10/24/2016                        CNS Tests No results found for: COLORCSF, APPEARCSF, RBCCOUNTCSF, WBCCSF, POLYSCSF, LYMPHSCSF, EOSCSF, PROTEINCSF, GLUCCSF, JCVIRUS, CSFOLI, IGGCSF                      Bone Pathology Markers No results found for: [place], VD125OH2TOT, [id number], [id number], 25OHVITD1, 25OHVITD2, 25OHVITD3, TESTOFREE, TESTOSTERONE                       Coagulation Parameters Lab Results  Component Value Date   PLT 331 02/06/2018                        Cardiovascular Markers  Lab Results  Component Value Date   HGB 12.3 02/06/2018   HCT 36.3 02/06/2018                         CA Markers No results found for: CEA, CA125, LABCA2                      Note: Lab results reviewed.  Recent Diagnostic Imaging Results  DG C-Arm 1-60 Min-No Report Fluoroscopy was utilized by the requesting physician.  No radiographic  interpretation.   Complexity Note: Imaging results reviewed. Results shared with Emily Stokes, using [employer].                         Meds   Current Outpatient Medications:  .  amLODipine (NORVASC) 10 MG tablet, Take 1 tablet (10 mg total) by mouth daily., Disp: 90 tablet, Rfl: 3 .  aspirin 81 MG tablet, Take 81 mg by mouth daily., Disp: , Rfl:  .  atorvastatin (LIPITOR) 20 MG tablet, TAKE 1 TABLET BY MOUTH  DAILY, Disp: 90 tablet, Rfl: 1 .  cyclobenzaprine (FLEXERIL) 10 MG tablet, Take 10 mg by mouth 3 (three) times daily as needed  for muscle spasms., Disp: , Rfl:  .  diclofenac (VOLTAREN) 75 MG EC tablet, Take 1 tablet (75 mg total) by mouth 2 (two) times daily after a meal., Disp: 60 tablet, Rfl: 0 .  glucose blood test strip, Test fasting blood sugar before breakfast daily. Recheck blood sugar if any symptoms of hypoglycemia., Disp: 100 each, Rfl: 4 .  metFORMIN (GLUCOPHAGE) 500 MG tablet, Take 1 tablet (500 mg total) by mouth 2 (two) times daily with a meal., Disp: 180 tablet, Rfl: 3 .  metoprolol tartrate (LOPRESSOR) 25 MG tablet, Take 1 tablet (25 mg total) by mouth 2 (two) times daily., Disp: 180 tablet, Rfl: 3 .  nitrofurantoin, macrocrystal-monohydrate, (MACROBID) 100 MG capsule, Take 100 mg by mouth 2 (two) times daily., Disp: , Rfl:  .  ondansetron (ZOFRAN ODT) 8 MG disintegrating tablet, Take 1 tablet (8 mg total) by mouth every 8 (eight) hours as needed for nausea or vomiting., Disp: 20 tablet, Rfl: 0 .  ONETOUCH DELICA LANCETS FINE MISC, Test fasting blood sugar each morning., Disp: 100 each, Rfl: 4 .  predniSONE (STERAPRED UNI-PAK 21 TAB) 10 MG (21) TBPK tablet, Take by mouth daily., Disp: , Rfl:  .  pregabalin (LYRICA) 50 MG capsule, Take 1 capsule (50 mg total) by mouth 3 (three) times daily., Disp: 90 capsule, Rfl: 1 .  Buprenorphine 7.5 MCG/HR PTWK, Place 7.5 mcg/hr onto the skin every 7 (seven) days., Disp: 4 patch, Rfl: 0  Current Facility-Administered Medications:  .  sodium chloride flush (NS) 0.9 % injection 20 mL, 20 mL, Other, Once, Mohammed Kindle, MD .  triamcinolone acetonide (KENALOG-40) injection 40 mg, 40 mg, Other, Once, Mohammed Kindle, MD  ROS  Constitutional: Denies any fever or chills Gastrointestinal: No reported hemesis, hematochezia, vomiting, or acute GI distress Musculoskeletal: Denies any acute onset joint swelling, redness, loss of ROM, or weakness Neurological: No reported episodes of acute onset apraxia, aphasia, dysarthria, agnosia, amnesia, paralysis, loss of coordination, or  loss of consciousness  Allergies  Emily Stokes has No Known Allergies.  Stem  Drug: Emily Stokes  reports that she does not use drugs. Alcohol:  reports that she does not drink alcohol. Tobacco:  reports that she has never smoked. She has  never used smokeless tobacco. Medical:  has a past medical history of Abdominal pain, epigastric, Anemia, Backache, Bronchitis, Calculus, kidney, Carpal tunnel syndrome, Chest pain, Chicken pox, Circulatory disease, Disorder of kidney and ureter, Excess, menstruation, Hypercholesteremia, Hypertension, essential, benign, Infected postoperative seroma, Iron deficiency, Malaise and fatigue, Measles, MRSA (methicillin resistant staph aureus) culture positive, Nausea alone, Nonspecific abnormal electrocardiogram (ECG) (EKG), and Pain in joint, lower leg. Surgical: Emily Stokes  has a past surgical history that includes Colonoscopy (1997); Partial hysterectomy (2009); Cesarean section; Lithotripsy (1997); mrsa; Hernia repair (2012); Abdominal hysterectomy; Hartmann's procedure.; Colostomy takedown (2012); and Colonoscopy with propofol (N/A, 01/09/2018). Family: family history includes Diabetes in her mother, other, other, and other; Hypertension in her father, mother, other, other, and other; Stroke in her father.  Constitutional Exam  General appearance: Well nourished, well developed, and well hydrated. In no apparent acute distress Vitals:   02/12/18 1330  BP: (!) 134/109  Pulse: 77  Resp: 16  Temp: 98.7 F (37.1 C)  TempSrc: Oral  SpO2: 98%  Weight: 240 lb (108.9 kg)  Height: _0  (1.753 m)   BMI Assessment: Estimated body mass index is 35.44 kg/m as calculated from the following:   Height as of this encounter: _1  (1.753 m).   Weight as of this encounter: 240 lb (108.9 kg).  BMI interpretation table: BMI level Category Range association with higher incidence of chronic pain  <18 kg/m2 Underweight   18.5-24.9 kg/m2 Ideal body weight   25-29.9 kg/m2  Overweight Increased incidence by 20%  30-34.9 kg/m2 Obese (Class I) Increased incidence by 68%  35-39.9 kg/m2 Severe obesity (Class II) Increased incidence by 136%  >40 kg/m2 Extreme obesity (Class III) Increased incidence by 254%   Patient's current BMI Ideal Body weight  Body mass index is 35.44 kg/m. Ideal body weight: 66.2 kg (145 lb 15.1 oz) Adjusted ideal body weight: 83.3 kg (183 lb 9.1 oz)   BMI Readings from Last 4 Encounters:  02/12/18 35.44 kg/m  01/30/18 34.11 kg/m  01/09/18 35.15 kg/m  01/07/18 33.97 kg/m   Wt Readings from Last 4 Encounters:  02/12/18 240 lb (108.9 kg)  01/30/18 231 lb (104.8 kg)  01/09/18 238 lb (108 kg)  01/07/18 230 lb (104.3 kg)  Psych/Mental status: Alert, oriented x 3 (person, place, & time)       Eyes: PERLA Respiratory: No evidence of acute respiratory distress  Cervical Spine Area Exam  Skin & Axial Inspection: No masses, redness, edema, swelling, or associated skin lesions Alignment: Symmetrical Functional ROM: Unrestricted ROM      Stability: No instability detected Muscle Tone/Strength: Functionally intact. No obvious neuro-muscular anomalies detected. Sensory (Neurological): Unimpaired Palpation: No palpable anomalies              Upper Extremity (UE) Exam    Side: Right upper extremity  Side: Left upper extremity  Skin & Extremity Inspection: Skin color, temperature, and hair growth are WNL. No peripheral edema or cyanosis. No masses, redness, swelling, asymmetry, or associated skin lesions. No contractures.  Skin & Extremity Inspection: Skin color, temperature, and hair growth are WNL. No peripheral edema or cyanosis. No masses, redness, swelling, asymmetry, or associated skin lesions. No contractures.  Functional ROM: Unrestricted ROM          Functional ROM: Unrestricted ROM          Muscle Tone/Strength: Functionally intact. No obvious neuro-muscular anomalies detected.  Muscle Tone/Strength: Functionally intact. No obvious  neuro-muscular anomalies detected.  Sensory (Neurological): Unimpaired  Sensory (Neurological): Unimpaired          Palpation: No palpable anomalies              Palpation: No palpable anomalies              Provocative Test(s):  Phalen's test: deferred Tinel's test: deferred Apley's scratch test (touch opposite shoulder):  Action 1 (Across chest): deferred Action 2 (Overhead): deferred Action 3 (LB reach): deferred   Provocative Test(s):  Phalen's test: deferred Tinel's test: deferred Apley's scratch test (touch opposite shoulder):  Action 1 (Across chest): deferred Action 2 (Overhead): deferred Action 3 (LB reach): deferred    Thoracic Spine Area Exam  Skin & Axial Inspection: No masses, redness, or swelling Alignment: Symmetrical Functional ROM: Unrestricted ROM Stability: No instability detected Muscle Tone/Strength: Functionally intact. No obvious neuro-muscular anomalies detected. Sensory (Neurological): Unimpaired Muscle strength & Tone: No palpable anomalies  Lumbar Spine Area Exam  Skin & Axial Inspection: No masses, redness, or swelling Alignment: Symmetrical Functional ROM: Decreased ROM affecting primarily the left Stability: No instability detected Muscle Tone/Strength: Functionally intact. No obvious neuro-muscular anomalies detected. Sensory (Neurological): Dermatomal pain pattern Palpation: No palpable anomalies       Provocative Tests: Hyperextension/rotation test: deferred today       Lumbar quadrant test (Kemp's test): deferred today       Lateral bending test: (+) ipsilateral radicular pain, on the left. Positive for left-sided foraminal stenosis. Patrick's Maneuver: (+) for left-sided S-I arthralgia             FABER test: deferred today                   S-I anterior distraction/compression test: deferred today         S-I lateral compression test: deferred today         S-I Thigh-thrust test: deferred today         S-I Gaenslen's test:  deferred today          Gait & Posture Assessment  Ambulation: Unassisted Gait: Relatively normal for age and body habitus Posture: WNL   Lower Extremity Exam    Side: Right lower extremity  Side: Left lower extremity  Stability: No instability observed          Stability: No instability observed          Skin & Extremity Inspection: Skin color, temperature, and hair growth are WNL. No peripheral edema or cyanosis. No masses, redness, swelling, asymmetry, or associated skin lesions. No contractures.  Skin & Extremity Inspection: Skin color, temperature, and hair growth are WNL. No peripheral edema or cyanosis. No masses, redness, swelling, asymmetry, or associated skin lesions. No contractures.  Functional ROM: Unrestricted ROM                  Functional ROM: Unrestricted ROM                  Muscle Tone/Strength: Functionally intact. No obvious neuro-muscular anomalies detected.  Muscle Tone/Strength: Functionally intact. No obvious neuro-muscular anomalies detected.  Sensory (Neurological): Unimpaired  Sensory (Neurological): Unimpaired  Palpation: No palpable anomalies  Palpation: No palpable anomalies   Assessment  Primary Diagnosis & Pertinent Problem List: The primary encounter diagnosis was Lumbar radiculopathy. Diagnoses of Lumbar degenerative disc disease, Lumbar facet arthropathy, Lumbar spondylosis, DDD (degenerative disc disease), lumbar, Facet syndrome, lumbar, and Chronic pain syndrome were also pertinent to this visit.  Status Diagnosis  Persistent Persistent Persistent 1. Lumbar radiculopathy  2. Lumbar degenerative disc disease   3. Lumbar facet arthropathy   4. Lumbar spondylosis   5. DDD (degenerative disc disease), lumbar   6. Facet syndrome, lumbar   7. Chronic pain syndrome      General Recommendations: The pain condition that the patient suffers from is best treated with a multidisciplinary approach that involves an increase in physical activity to prevent  de-conditioning and worsening of the pain cycle, as well as psychological counseling (formal and/or informal) to address the co-morbid psychological affects of pain. Treatment will often involve judicious use of pain medications and interventional procedures to decrease the pain, allowing the patient to participate in the physical activity that will ultimately produce long-lasting pain reductions. The goal of the multidisciplinary approach is to return the patient to a higher level of overall function and to restore their ability to perform activities of daily living.  57 year old female with a history of left axial low back pain that radiates down her left hip and left thigh secondary to lumbar radiculopathy, lumbar degenerative disc disease, lumbar spondylosis.  Patient does have a disc bulge at L5-S1 on the left resulting in L5 radiculopathy and L5-S1 facet pathology.  Patient follows up today status post left L4-L5 epidural steroid injection performed on 01/07/2018.  Patient states that the injection was only beneficial for approximately 2 days and then she had to go to the emergency department for worsening back pain.  We will not plan on repeating epidural steroid injection since the patient did not get any significant benefit from it.  Patient is obtaining mild benefit with Lyrica 50 mg 3 times a day.  I will have her continue this dose as it is.  Patient also continues diclofenac 75 mg twice daily.  Given that the patient is continued to have persistent pain even after lumbar ESI and non-opioid analgesics, I think it is reasonable to consider buprenorphine therapy.  Risks and benefits of this procedure were discussed.  Patient prefers something long-acting given that she is in pain every day rather than oral tablets.  This is reasonable.  I will have the patient sign opioid contract today.  Patient will follow-up in approximately 1 month.  Plan: -Sign opiate contract -Continue Lyrica 50 mg 3 times  daily, diclofenac 75 mg twice daily.  Instructed patient to refrain from any NSAIDs while on diclofenac -Start buprenorphine 7.5 mcg patch weekly  Future considerations: -Lumbar facet medial branch nerve blocks -Left SI joint injection  Interventional history: Left L4-L5 ESI on 01/07/2018 not helpful.  Plan of Care  Pharmacotherapy (Medications Ordered): Meds ordered this encounter  Medications  . DISCONTD: Buprenorphine 7.5 MCG/HR PTWK    Sig: Place 7.5 mcg/hr onto the skin every 7 (seven) days.    Dispense:  4 patch    Refill:  0    Do not place this medication, or any other prescription from our practice, on "Automatic Refill". Patient may have prescription filled one day early if pharmacy is closed on scheduled refill date. Do not fill until:  To last until:  . pregabalin (LYRICA) 50 MG capsule    Sig: Take 1 capsule (50 mg total) by mouth 3 (three) times daily.    Dispense:  90 capsule    Refill:  1    Do not place this medication, or any other prescription from our practice, on "Automatic Refill". Patient may have prescription filled one day early if pharmacy is closed on scheduled refill date.  . Buprenorphine 7.5 MCG/HR PTWK  Sig: Place 7.5 mcg/hr onto the skin every 7 (seven) days.    Dispense:  4 patch    Refill:  0    Do not place this medication, or any other prescription from our practice, on "Automatic Refill". Patient may have prescription filled one day early if pharmacy is closed on scheduled refill date.   Lab-work, procedure(s), and/or referral(s): No orders of the defined types were placed in this encounter.   Pharmacological management options:  Opioid Analgesics: We'll take over management today. See above orders Membrane stabilizer: We have discussed the possibility of optimizing this mode of therapy, if tolerated Muscle relaxant: We have discussed the possibility of a trial NSAID: We have discussed the possibility of a trial Other analgesic(s): To be  determined at a later time   Time Note: Greater than 50% of the 25 minute(s) of face-to-face time spent with Emily Stokes, was spent in counseling/coordination of care regarding: Emily Stokes's primary cause of pain, the treatment plan, medication side effects, the opioid analgesic risks and possible complications, the results, interpretation and significance of  her recent diagnostic interventional treatment(s), the appropriate use of her medications, realistic expectations, the goals of pain management (increased in functionality), the medication agreement and the patient's responsibilities when it comes to controlled substances.  Provider-requested follow-up: Return in about 4 weeks (around 03/12/2018) for Medication Management.  Future Appointments  Date Time Provider [place]  02/17/2018  2:20 PM Margo Common, [place] [facility] None  03/12/2018  1:30 PM Gillis Santa, MD [facility] None  11/06/2018  2:00 PM [facility] None    Primary Care Physician: Margo Common, PA Location: [facility] Note by: Gillis Santa, M.D Date: 02/12/2018; Time: 3:15 PM  Patient Instructions  Opiod contracts signed.  Rx for Butrans patches and Lyrica  to last until 03/12/18 have been escribed to your pharmacy.

## 2018-02-12 NOTE — Progress Notes (Signed)
Safety precautions to be maintained throughout the outpatient stay will include: orient to surroundings, keep bed in low position, maintain call bell within reach at all times, provide assistance with transfer out of bed and ambulation.  

## 2018-02-12 NOTE — Patient Instructions (Addendum)
Opiod contracts signed.  Rx for Butrans patches and Lyrica  to last until 03/12/18 have been escribed to your pharmacy.

## 2018-02-17 ENCOUNTER — Ambulatory Visit: Payer: Medicare Other | Admitting: Family Medicine

## 2018-02-26 ENCOUNTER — Encounter: Payer: Self-pay | Admitting: Family Medicine

## 2018-02-26 ENCOUNTER — Ambulatory Visit (INDEPENDENT_AMBULATORY_CARE_PROVIDER_SITE_OTHER): Payer: Medicare Other | Admitting: Family Medicine

## 2018-02-26 VITALS — BP 108/60 | HR 80 | Temp 97.9°F | Resp 16 | Wt 234.0 lb

## 2018-02-26 DIAGNOSIS — N390 Urinary tract infection, site not specified: Secondary | ICD-10-CM

## 2018-02-26 DIAGNOSIS — M5416 Radiculopathy, lumbar region: Secondary | ICD-10-CM

## 2018-02-26 MED ORDER — SULFAMETHOXAZOLE-TRIMETHOPRIM 800-160 MG PO TABS: 1.0000 | ORAL_TABLET | Freq: Two times a day (BID) | ORAL | 0 refills | Status: DC

## 2018-02-26 NOTE — Progress Notes (Signed)
Patient: Emily Stokes Female    DOB: [birth year]   57 y.o.   MRN: [id number] Visit Date: 02/26/2018  Today's Provider: [name], PA   Chief Complaint  Patient presents with  . Follow-up   Subjective:    HPI  Follow up ER visit  Patient was seen in ER for Lumbar back pain and UTI on 02/08/2018.  Treatment for this included lidoderm, flexeril and prednisone for back pain Patient was also advised to complete Macrobid for UTI.   Marland Kitchen She reports good compliance with treatment. She reports this condition is Unchanged.  ------------------------------------------------------------------------------------    Past Medical History:  Diagnosis Date  . Abdominal pain, epigastric   . Anemia   . Backache   . Bronchitis   . Calculus, kidney   . Carpal tunnel syndrome   . Chest pain   . Chicken pox   . Circulatory disease   . Disorder of kidney and ureter   . Excess, menstruation   . Hypercholesteremia   . Hypertension, essential, benign   . Infected postoperative seroma   . Iron deficiency   . Malaise and fatigue   . Measles   . MRSA (methicillin resistant staph aureus) culture positive   . Nausea alone   . Nonspecific abnormal electrocardiogram (ECG) (EKG)   . Pain in joint, lower leg    Past Surgical History:  Procedure Laterality Date  . ABDOMINAL HYSTERECTOMY    . CESAREAN SECTION    . COLONOSCOPY  1997  . COLONOSCOPY WITH PROPOFOL N/A 01/09/2018   Procedure: COLONOSCOPY WITH PROPOFOL;  Surgeon: Virgel Manifold, MD;  Location: [facility] ENDOSCOPY;  Service: Endoscopy;  Laterality: N/A;  . COLOSTOMY TAKEDOWN  2012  . Hartmann's procedure.    Marland Kitchen HERNIA REPAIR  7048   umbilical  . LITHOTRIPSY  8891   with complications, hospitalized due to these complications for 3 months  . mrsa     removal on nose  . PARTIAL HYSTERECTOMY  2009    vaginal hysterectomy, has both ovaries   Family History  Problem Relation Age of Onset  . Hypertension Father   . Stroke  Father   . Diabetes Mother   . Hypertension Mother   . Diabetes Other        sibling  . Diabetes Other        sibling  . Diabetes Other        sibling  . Hypertension Other        sibling  . Hypertension Other        sibling  . Hypertension Other        sibling   No Known Allergies  Current Outpatient Medications:  .  amLODipine (NORVASC) 10 MG tablet, Take 1 tablet (10 mg total) by mouth daily., Disp: 90 tablet, Rfl: 3 .  aspirin 81 MG tablet, Take 81 mg by mouth daily., Disp: , Rfl:  .  atorvastatin (LIPITOR) 20 MG tablet, TAKE 1 TABLET BY MOUTH  DAILY, Disp: 90 tablet, Rfl: 1 .  Buprenorphine 7.5 MCG/HR PTWK, Place 7.5 mcg/hr onto the skin every 7 (seven) days., Disp: 4 patch, Rfl: 0 .  cyclobenzaprine (FLEXERIL) 10 MG tablet, Take 10 mg by mouth 3 (three) times daily as needed for muscle spasms., Disp: , Rfl:  .  diclofenac (VOLTAREN) 75 MG EC tablet, Take 1 tablet (75 mg total) by mouth 2 (two) times daily after a meal., Disp: 60 tablet, Rfl: 0 .  glucose blood test  strip, Test fasting blood sugar before breakfast daily. Recheck blood sugar if any symptoms of hypoglycemia., Disp: 100 each, Rfl: 4 .  metFORMIN (GLUCOPHAGE) 500 MG tablet, Take 1 tablet (500 mg total) by mouth 2 (two) times daily with a meal., Disp: 180 tablet, Rfl: 3 .  metoprolol tartrate (LOPRESSOR) 25 MG tablet, Take 1 tablet (25 mg total) by mouth 2 (two) times daily., Disp: 180 tablet, Rfl: 3 .  nitrofurantoin, macrocrystal-monohydrate, (MACROBID) 100 MG capsule, Take 100 mg by mouth 2 (two) times daily., Disp: , Rfl:  .  ondansetron (ZOFRAN ODT) 8 MG disintegrating tablet, Take 1 tablet (8 mg total) by mouth every 8 (eight) hours as needed for nausea or vomiting., Disp: 20 tablet, Rfl: 0 .  ONETOUCH DELICA LANCETS FINE MISC, Test fasting blood sugar each morning., Disp: 100 each, Rfl: 4 .  pregabalin (LYRICA) 50 MG capsule, Take 1 capsule (50 mg total) by mouth 3 (three) times daily., Disp: 90 capsule, Rfl:  1 .  predniSONE (STERAPRED UNI-PAK 21 TAB) 10 MG (21) TBPK tablet, Take by mouth daily., Disp: , Rfl:   Current Facility-Administered Medications:  .  sodium chloride flush (NS) 0.9 % injection 20 mL, 20 mL, Other, Once, Mohammed Kindle, MD .  triamcinolone acetonide (KENALOG-40) injection 40 mg, 40 mg, Other, Once, Mohammed Kindle, MD  Review of Systems  Constitutional: Positive for fatigue. Negative for appetite change, chills and fever.  Respiratory: Negative for chest tightness and shortness of breath.   Cardiovascular: Negative for chest pain and palpitations.  Gastrointestinal: Negative for abdominal pain, nausea and vomiting.  Musculoskeletal: Positive for back pain.  Neurological: Negative for dizziness and weakness.   Social History   Tobacco Use  . Smoking status: Never Smoker  . Smokeless tobacco: Never Used  Substance Use Topics  . Alcohol use: No    Alcohol/week: 0.0 standard drinks   Objective:   BP 108/60 (BP Location: Left Arm, Cuff Size: Large)   Pulse 80   Temp 97.9 F (36.6 C) (Oral)   Resp 16   Wt 234 lb (106.1 kg)   SpO2 95% Comment: room air  BMI 34.56 kg/m  Vitals:   02/26/18 1442 02/26/18 1447  BP: 102/60 108/60  Pulse: 80   Resp: 16   Temp: 97.9 F (36.6 C)   TempSrc: Oral   SpO2: 95%   Weight: 234 lb (106.1 kg)    Physical Exam  Constitutional: She is oriented to person, place, and time. She appears well-developed and well-nourished. No distress.  HENT:  Head: Normocephalic and atraumatic.  Right Ear: Hearing normal.  Left Ear: Hearing normal.  Nose: Nose normal.  Eyes: Conjunctivae and lids are normal. Right eye exhibits no discharge. Left eye exhibits no discharge. No scleral icterus.  Cardiovascular: Normal rate and regular rhythm.  Pulmonary/Chest: Effort normal and breath sounds normal. No respiratory distress.  Abdominal: Soft. Bowel sounds are normal.  Musculoskeletal: She exhibits tenderness.  Chronic pain in left lower back  with tenderness and radiation down the left leg from lumbar DDD with radiculopathy and facet disease.  Neurological: She is alert and oriented to person, place, and time.  Skin: Skin is intact. No lesion and no rash noted.  Psychiatric: She has a normal mood and affect. Her speech is normal and behavior is normal. Thought content normal.      Assessment & Plan:     1. Urinary tract infection without hematuria, site unspecified Finished the Macrobid 02-22-18 prescribed at the ER for pyuria and  back pain. No burning or stinging with urination. Some frequency but no hematuria or similar symptoms to the past kidney stones. Unable to collect urine for urinalysis today. Feels she is having the same symptoms. Will give Septra-DS and asked her to be prepared to collect an urine specimen in 10 days. May use AZO-Standard prn and drink plenty of fluids. - sulfamethoxazole-trimethoprim (BACTRIM DS,SEPTRA DS) 800-160 MG tablet; Take 1 tablet by mouth 2 (two) times daily.  Dispense: 20 tablet; Refill: 0  2. Lumbar back pain with radiculopathy affecting left lower extremity Unchanged. Only had a day of relief from the last spinal injections by Dr. Holley [facility] ([facility] Continue follow up with him.       [name], PA  [facility]

## 2018-03-12 ENCOUNTER — Ambulatory Visit: Payer: Medicare Other | Admitting: Student in an Organized Health Care Education/Training Program

## 2018-03-18 ENCOUNTER — Encounter: Payer: Self-pay | Admitting: Student in an Organized Health Care Education/Training Program

## 2018-03-18 ENCOUNTER — Ambulatory Visit
Payer: Medicare Other | Attending: Student in an Organized Health Care Education/Training Program | Admitting: Student in an Organized Health Care Education/Training Program

## 2018-03-18 ENCOUNTER — Other Ambulatory Visit: Payer: Self-pay

## 2018-03-18 VITALS — BP 125/79 | HR 74 | Temp 98.1°F | Resp 18 | Ht 69.0 in | Wt 230.0 lb

## 2018-03-18 DIAGNOSIS — Z823 Family history of stroke: Secondary | ICD-10-CM | POA: Insufficient documentation

## 2018-03-18 DIAGNOSIS — M4686 Other specified inflammatory spondylopathies, lumbar region: Secondary | ICD-10-CM | POA: Diagnosis not present

## 2018-03-18 DIAGNOSIS — D509 Iron deficiency anemia, unspecified: Secondary | ICD-10-CM | POA: Insufficient documentation

## 2018-03-18 DIAGNOSIS — I10 Essential (primary) hypertension: Secondary | ICD-10-CM | POA: Insufficient documentation

## 2018-03-18 DIAGNOSIS — E7800 Pure hypercholesterolemia, unspecified: Secondary | ICD-10-CM | POA: Insufficient documentation

## 2018-03-18 DIAGNOSIS — M5136 Other intervertebral disc degeneration, lumbar region: Secondary | ICD-10-CM | POA: Diagnosis not present

## 2018-03-18 DIAGNOSIS — K648 Other hemorrhoids: Secondary | ICD-10-CM | POA: Diagnosis not present

## 2018-03-18 DIAGNOSIS — B9689 Other specified bacterial agents as the cause of diseases classified elsewhere: Secondary | ICD-10-CM | POA: Diagnosis not present

## 2018-03-18 DIAGNOSIS — L03211 Cellulitis of face: Secondary | ICD-10-CM | POA: Diagnosis not present

## 2018-03-18 DIAGNOSIS — G894 Chronic pain syndrome: Secondary | ICD-10-CM | POA: Insufficient documentation

## 2018-03-18 DIAGNOSIS — Z8249 Family history of ischemic heart disease and other diseases of the circulatory system: Secondary | ICD-10-CM | POA: Insufficient documentation

## 2018-03-18 DIAGNOSIS — M5137 Other intervertebral disc degeneration, lumbosacral region: Secondary | ICD-10-CM | POA: Insufficient documentation

## 2018-03-18 DIAGNOSIS — M25529 Pain in unspecified elbow: Secondary | ICD-10-CM | POA: Insufficient documentation

## 2018-03-18 DIAGNOSIS — M25552 Pain in left hip: Secondary | ICD-10-CM | POA: Diagnosis not present

## 2018-03-18 DIAGNOSIS — Z79899 Other long term (current) drug therapy: Secondary | ICD-10-CM | POA: Insufficient documentation

## 2018-03-18 DIAGNOSIS — G5600 Carpal tunnel syndrome, unspecified upper limb: Secondary | ICD-10-CM | POA: Insufficient documentation

## 2018-03-18 DIAGNOSIS — M545 Low back pain: Secondary | ICD-10-CM | POA: Diagnosis not present

## 2018-03-18 DIAGNOSIS — M5416 Radiculopathy, lumbar region: Secondary | ICD-10-CM

## 2018-03-18 DIAGNOSIS — Z79891 Long term (current) use of opiate analgesic: Secondary | ICD-10-CM | POA: Insufficient documentation

## 2018-03-18 DIAGNOSIS — Z5181 Encounter for therapeutic drug level monitoring: Secondary | ICD-10-CM | POA: Insufficient documentation

## 2018-03-18 DIAGNOSIS — M47816 Spondylosis without myelopathy or radiculopathy, lumbar region: Secondary | ICD-10-CM | POA: Insufficient documentation

## 2018-03-18 DIAGNOSIS — F329 Major depressive disorder, single episode, unspecified: Secondary | ICD-10-CM | POA: Insufficient documentation

## 2018-03-18 DIAGNOSIS — K439 Ventral hernia without obstruction or gangrene: Secondary | ICD-10-CM | POA: Diagnosis not present

## 2018-03-18 DIAGNOSIS — Z7984 Long term (current) use of oral hypoglycemic drugs: Secondary | ICD-10-CM | POA: Insufficient documentation

## 2018-03-18 DIAGNOSIS — Z6833 Body mass index (BMI) 33.0-33.9, adult: Secondary | ICD-10-CM | POA: Diagnosis not present

## 2018-03-18 DIAGNOSIS — E669 Obesity, unspecified: Secondary | ICD-10-CM | POA: Insufficient documentation

## 2018-03-18 DIAGNOSIS — M4726 Other spondylosis with radiculopathy, lumbar region: Secondary | ICD-10-CM | POA: Insufficient documentation

## 2018-03-18 DIAGNOSIS — Z833 Family history of diabetes mellitus: Secondary | ICD-10-CM | POA: Insufficient documentation

## 2018-03-18 DIAGNOSIS — N200 Calculus of kidney: Secondary | ICD-10-CM | POA: Insufficient documentation

## 2018-03-18 DIAGNOSIS — M25519 Pain in unspecified shoulder: Secondary | ICD-10-CM | POA: Insufficient documentation

## 2018-03-18 DIAGNOSIS — E1165 Type 2 diabetes mellitus with hyperglycemia: Secondary | ICD-10-CM | POA: Insufficient documentation

## 2018-03-18 DIAGNOSIS — D120 Benign neoplasm of cecum: Secondary | ICD-10-CM | POA: Insufficient documentation

## 2018-03-18 DIAGNOSIS — Z90711 Acquired absence of uterus with remaining cervical stump: Secondary | ICD-10-CM | POA: Insufficient documentation

## 2018-03-18 DIAGNOSIS — Z7982 Long term (current) use of aspirin: Secondary | ICD-10-CM | POA: Insufficient documentation

## 2018-03-18 MED ORDER — PREGABALIN 50 MG PO CAPS: 50.0000 mg | ORAL_CAPSULE | Freq: Three times a day (TID) | ORAL | 1 refills | Status: DC

## 2018-03-18 MED ORDER — HYDROCODONE-ACETAMINOPHEN 7.5-325 MG PO TABS: 1.0000 | ORAL_TABLET | Freq: Three times a day (TID) | ORAL | 0 refills | Status: DC | PRN

## 2018-03-18 NOTE — Progress Notes (Signed)
Patient's Name: Emily Stokes  MRN: [id number]  Referring Provider: Margo Common, PA  DOB: 11/06/1960  PCP: Margo Common, PA  DOS: 03/18/2018  Note by: Gillis Santa, MD  Service setting: Ambulatory outpatient  Specialty: Interventional Pain Management  Location: [facility] (AMB) Pain Management Facility    Patient type: Established   Primary Reason(s) for Visit: Encounter for prescription drug management. (Level of risk: moderate)  CC: Back Pain (left and low); Hip Pain (left); and Leg Pain (left thigh)  HPI  Emily Stokes is a 57 y.o. year old, female patient, who comes today for a medication management evaluation. She has Palpitations; HTN (hypertension); Abnormal finding on EKG; Obesity; Anemia, iron deficiency; Calculus of kidney; History of chicken pox; Leg pain; Urinary system disease; Hypercholesteremia; Blood glucose elevated; Benign essential HTN; Infected surgical wound; Pain in soft tissues of limb; Nonspecific ST-T changes; Arthralgia of lower leg; Awareness of heartbeats; Pain in extremity at multiple sites; Snores; Adnexal mass; Pain in shoulder; Hernia of anterior abdominal wall; DDD (degenerative disc disease), lumbar; Facet syndrome, lumbar; Lumbar radiculopathy; Sacroiliac joint dysfunction; Cellulitis of face; Chronic pain syndrome; Nasal septal abscess; Pelvic cyst; Pain in elbow; Recurrent abdominal hernia without obstruction or gangrene; Type 2 diabetes mellitus without complication, without long-term current use of insulin ([place]); Encounter for screening colonoscopy; Internal hemorrhoids; Benign neoplasm of descending colon; and Benign neoplasm of cecum on their problem list. Her primarily concern today is the Back Pain (left and low); Hip Pain (left); and Leg Pain (left thigh)  Pain Assessment: Location: Lower Back Radiating: left hip and thigh Onset: More than a month ago Duration: Chronic pain Quality: Constant, Sharp, Heaviness, Discomfort, Numbness Severity: 9 /10  (subjective, self-reported pain score)  Note: Reported level is inconsistent with clinical observations. Clinically the patient looks like a 3/10 A 3/10 is viewed as "Moderate" and described as significantly interfering with activities of daily living (ADL). It becomes difficult to feed, bathe, get dressed, get on and off the toilet or to perform personal hygiene functions. Difficult to get in and out of bed or a chair without assistance. Very distracting. With effort, it can be ignored when deeply involved in activities. Information on the proper use of the pain scale provided to the patient today. When using our objective Pain Scale, levels between 6 and 10/10 are said to belong in an emergency room, as it progressively worsens from a 6/10, described as severely limiting, requiring emergency care not usually available at an outpatient pain management facility. At a 6/10 level, communication becomes difficult and requires great effort. Assistance to reach the emergency department may be required. Facial flushing and profuse sweating along with potentially dangerous increases in heart rate and blood pressure will be evident. Effect on ADL:   Timing: Constant Modifying factors: nothing BP: 125/79  HR: 74  Emily Stokes was last scheduled for an appointment on 03/12/2018 for medication management. During today's appointment we reviewed Emily Stokes's chronic pain status, as well as her outpatient medication regimen.  Patient continues to have significant axial low back pain.  This is radiating to her left hip and left thigh.  She was unable to get Butrans filled given that insurance would not approve.  The patient  reports that she does not use drugs. Her body mass index is 33.97 kg/m.  Further details on both, my assessment(s), as well as the proposed treatment plan, please see below.  Controlled Substance Pharmacotherapy Assessment REMS (Risk Evaluation and Mitigation Strategy)  Analgesic: We will  prescribe hydrocodone 7.5 mg 3 times daily as needed MME/day: 22.5 mg/day.  Emily Rochester, RN  03/18/2018  2:35 PM  Sign at close encounter Safety precautions to be maintained throughout the outpatient stay will include: orient to surroundings, keep bed in low position, maintain call bell within reach at all times, provide assistance with transfer out of bed and ambulation.   Monitoring: [place] PMP: Online review of the past 36-monthperiod conducted. Compliant with practice rules and regulations Last UDS on record: Summary  Date Value Ref Range Status  12/23/2017 FINAL  Final    Comment:    ==================================================================== TOXASSURE COMP DRUG ANALYSIS,UR ==================================================================== Test                             Result       Flag       Units Drug Present and Declared for Prescription Verification   Salicylate                     PRESENT      EXPECTED   Metoprolol                     PRESENT      EXPECTED Drug Present not Declared for Prescription Verification   Acetaminophen                  PRESENT      UNEXPECTED Drug Absent but Declared for Prescription Verification   Tapentadol                     Not Detected UNEXPECTED ng/mg creat   Pregabalin                     Not Detected UNEXPECTED   Diclofenac                     Not Detected UNEXPECTED    Diclofenac, as indicated in the declared medication list, is not    always detected even when used as directed. ==================================================================== Test                      Result    Flag   Units      Ref Range   Creatinine              169              mg/dL      >=20 ==================================================================== Declared Medications:  The flagging and interpretation on this report are based on the  following declared medications.  Unexpected results may arise from  inaccuracies in the declared  medications.  **Note: The testing scope of this panel includes these medications:  Metoprolol (Lopressor)  Pregabalin (Stokes)  Tapentadol (Nucynta)  **Note: The testing scope of this panel does not include small to  moderate amounts of these reported medications:  Aspirin (Aspirin 81)  Diclofenac  **Note: The testing scope of this panel does not include following  reported medications:  Amlodipine (Norvasc)  Atorvastatin (Lipitor)  Metformin  Ondansetron (Zofran) ==================================================================== For clinical consultation, please call [phone] ====================================================================    UDS interpretation: Compliant          Medication Assessment Form: Reviewed. Patient indicates being compliant with therapy Treatment compliance: Compliant Risk Assessment Profile: Aberrant behavior: See prior evaluations. None observed or detected today Comorbid factors increasing risk of  overdose: See prior notes. No additional risks detected today Opioid risk tool (ORT) (Total Score): 0 Personal History of Substance Abuse (SUD-Substance use disorder):  Alcohol: Negative  Illegal Drugs: Negative  Rx Drugs: Negative  ORT Risk Level calculation: Low Risk Risk of substance use disorder (SUD): Low Opioid Risk Tool - 03/18/18 1442      Family History of Substance Abuse   Alcohol  Negative    Illegal Drugs  Negative    Rx Drugs  Negative      Personal History of Substance Abuse   Alcohol  Negative    Illegal Drugs  Negative    Rx Drugs  Negative      Age   Age between 4-45 years   No      History of Preadolescent Sexual Abuse   History of Preadolescent Sexual Abuse  Negative or Female      Psychological Disease   Psychological Disease  Negative    Depression  Negative      Total Score   Opioid Risk Tool Scoring  0    Opioid Risk Interpretation  Low Risk      ORT Scoring interpretation table:  Score <3 = Low  Risk for SUD  Score between 4-7 = Moderate Risk for SUD  Score >8 = High Risk for Opioid Abuse   Risk Mitigation Strategies:  Patient Counseling: Covered Patient-Prescriber Agreement (PPA): Present and active  Notification to other healthcare providers: Done  Pharmacologic Plan: Hydrocodone 7.5 mill grams 3 times daily as needed             Laboratory Chemistry  Inflammation Markers (CRP: Acute Phase) (ESR: Chronic Phase) No results found for: CRP, ESRSEDRATE, LATICACIDVEN                       Rheumatology Markers No results found for: RF, ANA, LABURIC, URICUR, LYMEIGGIGMAB, LYMEABIGMQN, HLAB27                      Renal Function Markers Lab Results  Component Value Date   BUN 24 02/06/2018   CREATININE 1.23 (H) 02/06/2018   BCR 20 02/06/2018   GFRAA 56 (L) 02/06/2018   GFRNONAA 48 (L) 02/06/2018                             Hepatic Function Markers Lab Results  Component Value Date   AST 17 02/06/2018   ALT 15 02/06/2018   ALBUMIN 4.2 02/06/2018   ALKPHOS 117 02/06/2018   LIPASE 26 07/07/2017                        Electrolytes Lab Results  Component Value Date   NA 141 02/06/2018   K 4.3 02/06/2018   CL 106 02/06/2018   CALCIUM 9.1 02/06/2018                        Neuropathy Markers Lab Results  Component Value Date   HGBA1C 6.8 (A) 01/30/2018   HIV Non Reactive 10/24/2016                        CNS Tests No results found for: COLORCSF, APPEARCSF, RBCCOUNTCSF, WBCCSF, POLYSCSF, LYMPHSCSF, EOSCSF, PROTEINCSF, GLUCCSF, JCVIRUS, CSFOLI, IGGCSF  Bone Pathology Markers No results found for: Marveen Reeks, [id number], [id number], 25OHVITD1, 25OHVITD2, 25OHVITD3, TESTOFREE, TESTOSTERONE                       Coagulation Parameters Lab Results  Component Value Date   PLT 331 02/06/2018   LABHEMA Note: 10/17/2017                        Cardiovascular Markers Lab Results  Component Value Date   HGB 12.3 02/06/2018   HCT 36.3  02/06/2018                         CA Markers No results found for: CEA, CA125, LABCA2                      Note: Lab results reviewed.  Recent Diagnostic Imaging Results  DG C-Arm 1-60 Min-No Report Fluoroscopy was utilized by the requesting physician.  No radiographic  interpretation.   Complexity Note: Imaging results reviewed. Results shared with Emily Stokes, using [employer].                         Meds   Current Outpatient Medications:  .  amLODipine (NORVASC) 10 MG tablet, Take 1 tablet (10 mg total) by mouth daily., Disp: 90 tablet, Rfl: 3 .  aspirin 81 MG tablet, Take 81 mg by mouth daily., Disp: , Rfl:  .  atorvastatin (LIPITOR) 20 MG tablet, TAKE 1 TABLET BY MOUTH  DAILY, Disp: 90 tablet, Rfl: 1 .  cyclobenzaprine (FLEXERIL) 10 MG tablet, Take 10 mg by mouth 3 (three) times daily as needed for muscle spasms., Disp: , Rfl:  .  glucose blood test strip, Test fasting blood sugar before breakfast daily. Recheck blood sugar if any symptoms of hypoglycemia., Disp: 100 each, Rfl: 4 .  metFORMIN (GLUCOPHAGE) 500 MG tablet, Take 1 tablet (500 mg total) by mouth 2 (two) times daily with a meal., Disp: 180 tablet, Rfl: 3 .  metoprolol tartrate (LOPRESSOR) 25 MG tablet, Take 1 tablet (25 mg total) by mouth 2 (two) times daily., Disp: 180 tablet, Rfl: 3 .  ondansetron (ZOFRAN ODT) 8 MG disintegrating tablet, Take 1 tablet (8 mg total) by mouth every 8 (eight) hours as needed for nausea or vomiting., Disp: 20 tablet, Rfl: 0 .  ONETOUCH DELICA LANCETS FINE MISC, Test fasting blood sugar each morning., Disp: 100 each, Rfl: 4 .  pregabalin (Stokes) 50 MG capsule, Take 1 capsule (50 mg total) by mouth 3 (three) times daily., Disp: 90 capsule, Rfl: 1 .  HYDROcodone-acetaminophen (NORCO) 7.5-325 MG tablet, Take 1 tablet by mouth every 8 (eight) hours as needed for moderate pain. For chronic pain.  To fill on or after 03/18/2018, 04/16/2018, Disp: 90 tablet, Rfl: 0 .   sulfamethoxazole-trimethoprim (BACTRIM DS,SEPTRA DS) 800-160 MG tablet, Take 1 tablet by mouth 2 (two) times daily. (Patient not taking: Reported on 03/18/2018), Disp: 20 tablet, Rfl: 0  ROS  Constitutional: Denies any fever or chills Gastrointestinal: No reported hemesis, hematochezia, vomiting, or acute GI distress Musculoskeletal: Denies any acute onset joint swelling, redness, loss of ROM, or weakness Neurological: No reported episodes of acute onset apraxia, aphasia, dysarthria, agnosia, amnesia, paralysis, loss of coordination, or loss of consciousness  Allergies  Emily Stokes has No Known Allergies.  [place]  Drug: Emily Stokes  reports that she does not use drugs.  Alcohol:  reports that she does not drink alcohol. Tobacco:  reports that she has never smoked. She has never used smokeless tobacco. Medical:  has a past medical history of Abdominal pain, epigastric, Anemia, Backache, Bronchitis, Calculus, kidney, Carpal tunnel syndrome, Chest pain, Chicken pox, Circulatory disease, Disorder of kidney and ureter, Excess, menstruation, Hypercholesteremia, Hypertension, essential, benign, Infected postoperative seroma, Iron deficiency, Malaise and fatigue, Measles, MRSA (methicillin resistant staph aureus) culture positive, Nausea alone, Nonspecific abnormal electrocardiogram (ECG) (EKG), and Pain in joint, lower leg. Surgical: Emily Stokes  has a past surgical history that includes Colonoscopy (1997); Partial hysterectomy (2009); Cesarean section; Lithotripsy (1997); mrsa; Hernia repair (2012); Abdominal hysterectomy; Hartmann's procedure.; Colostomy takedown (2012); and Colonoscopy with propofol (N/A, 01/09/2018). Family: family history includes Diabetes in her mother, other, other, and other; Hypertension in her father, mother, other, other, and other; Stroke in her father.  Constitutional Exam  General appearance: Well nourished, well developed, and well hydrated. In no apparent acute distress Vitals:    03/18/18 1436  BP: 125/79  Pulse: 74  Resp: 18  Temp: 98.1 F (36.7 C)  TempSrc: Oral  SpO2: 99%  Weight: 230 lb (104.3 kg)  Height: '5\' 9"'$  (1.753 m)   BMI Assessment: Estimated body mass index is 33.97 kg/m as calculated from the following:   Height as of this encounter: '5\' 9"'$  (1.753 m).   Weight as of this encounter: 230 lb (104.3 kg).  BMI interpretation table: BMI level Category Range association with higher incidence of chronic pain  <18 kg/m2 Underweight   18.5-24.9 kg/m2 Ideal body weight   25-29.9 kg/m2 Overweight Increased incidence by 20%  30-34.9 kg/m2 Obese (Class I) Increased incidence by 68%  35-39.9 kg/m2 Severe obesity (Class II) Increased incidence by 136%  >40 kg/m2 Extreme obesity (Class III) Increased incidence by 254%   Patient's current BMI Ideal Body weight  Body mass index is 33.97 kg/m. Ideal body weight: 66.2 kg (145 lb 15.1 oz) Adjusted ideal body weight: 81.5 kg (179 lb 9.1 oz)   BMI Readings from Last 4 Encounters:  03/18/18 33.97 kg/m  02/26/18 34.56 kg/m  02/12/18 35.44 kg/m  01/30/18 34.11 kg/m   Wt Readings from Last 4 Encounters:  03/18/18 230 lb (104.3 kg)  02/26/18 234 lb (106.1 kg)  02/12/18 240 lb (108.9 kg)  01/30/18 231 lb (104.8 kg)  Psych/Mental status: Alert, oriented x 3 (person, place, & time)       Eyes: PERLA Respiratory: No evidence of acute respiratory distress  Cervical Spine Area Exam  Skin & Axial Inspection: No masses, redness, edema, swelling, or associated skin lesions Alignment: Symmetrical Functional ROM: Unrestricted ROM      Stability: No instability detected Muscle Tone/Strength: Functionally intact. No obvious neuro-muscular anomalies detected. Sensory (Neurological): Unimpaired Palpation: No palpable anomalies              Upper Extremity (UE) Exam    Side: Right upper extremity  Side: Left upper extremity  Skin & Extremity Inspection: Skin color, temperature, and hair growth are WNL. No  peripheral edema or cyanosis. No masses, redness, swelling, asymmetry, or associated skin lesions. No contractures.  Skin & Extremity Inspection: Skin color, temperature, and hair growth are WNL. No peripheral edema or cyanosis. No masses, redness, swelling, asymmetry, or associated skin lesions. No contractures.  Functional ROM: Unrestricted ROM          Functional ROM: Unrestricted ROM          Muscle Tone/Strength: Functionally intact. No obvious neuro-muscular  anomalies detected.  Muscle Tone/Strength: Functionally intact. No obvious neuro-muscular anomalies detected.  Sensory (Neurological): Unimpaired          Sensory (Neurological): Unimpaired          Palpation: No palpable anomalies              Palpation: No palpable anomalies              Provocative Test(s):  Phalen's test: deferred Tinel's test: deferred Apley's scratch test (touch opposite shoulder):  Action 1 (Across chest): deferred Action 2 (Overhead): deferred Action 3 (LB reach): deferred   Provocative Test(s):  Phalen's test: deferred Tinel's test: deferred Apley's scratch test (touch opposite shoulder):  Action 1 (Across chest): deferred Action 2 (Overhead): deferred Action 3 (LB reach): deferred    Thoracic Spine Area Exam  Skin & Axial Inspection: No masses, redness, or swelling Alignment: Symmetrical Functional ROM: Unrestricted ROM Stability: No instability detected Muscle Tone/Strength: Functionally intact. No obvious neuro-muscular anomalies detected. Sensory (Neurological): Unimpaired Muscle strength & Tone: No palpable anomalies  Lumbar Spine Area Exam  Skin & Axial Inspection: No masses, redness, or swelling Alignment: Symmetrical Functional ROM: Decreased ROM       Stability: No instability detected Muscle Tone/Strength: Functionally intact. No obvious neuro-muscular anomalies detected. Sensory (Neurological): Dermatomal pain pattern and musculoskeletal Palpation: No palpable anomalies        Provocative Tests: Hyperextension/rotation test: (+) bilaterally for facet joint pain. Lumbar quadrant test (Kemp's test): deferred today       Lateral bending test: deferred today       Patrick's Maneuver: (+) for bilateral S-I arthralgia             FABER test: deferred today                   S-I anterior distraction/compression test: deferred today         S-I lateral compression test: deferred today         S-I Thigh-thrust test: deferred today         S-I Gaenslen's test: deferred today          Gait & Posture Assessment  Ambulation: Unassisted Gait: Relatively normal for age and body habitus Posture: WNL   Lower Extremity Exam    Side: Right lower extremity  Side: Left lower extremity  Stability: No instability observed          Stability: No instability observed          Skin & Extremity Inspection: Skin color, temperature, and hair growth are WNL. No peripheral edema or cyanosis. No masses, redness, swelling, asymmetry, or associated skin lesions. No contractures.  Skin & Extremity Inspection: Skin color, temperature, and hair growth are WNL. No peripheral edema or cyanosis. No masses, redness, swelling, asymmetry, or associated skin lesions. No contractures.  Functional ROM: Unrestricted ROM                  Functional ROM: Unrestricted ROM                  Muscle Tone/Strength: Functionally intact. No obvious neuro-muscular anomalies detected.  Muscle Tone/Strength: Functionally intact. No obvious neuro-muscular anomalies detected.  Sensory (Neurological): Unimpaired  Sensory (Neurological): Unimpaired  Palpation: No palpable anomalies  Palpation: No palpable anomalies   Assessment  Primary Diagnosis & Pertinent Problem List: The primary encounter diagnosis was Lumbar radiculopathy. Diagnoses of Lumbar degenerative disc disease, Lumbar facet arthropathy, Lumbar spondylosis, DDD (degenerative disc disease), lumbar, Facet syndrome,  lumbar, and Chronic pain syndrome were also  pertinent to this visit.  Status Diagnosis  Persistent Persistent Persistent 1. Lumbar radiculopathy   2. Lumbar degenerative disc disease   3. Lumbar facet arthropathy   4. Lumbar spondylosis   5. DDD (degenerative disc disease), lumbar   6. Facet syndrome, lumbar   7. Chronic pain syndrome      General Recommendations: The pain condition that the patient suffers from is best treated with a multidisciplinary approach that involves an increase in physical activity to prevent de-conditioning and worsening of the pain cycle, as well as psychological counseling (formal and/or informal) to address the co-morbid psychological affects of pain. Treatment will often involve judicious use of pain medications and interventional procedures to decrease the pain, allowing the patient to participate in the physical activity that will ultimately produce long-lasting pain reductions. The goal of the multidisciplinary approach is to return the patient to a higher level of overall function and to restore their ability to perform activities of daily living.  57 year old female with a history of left axial low back pain that radiates down her left hip and left thigh secondary to lumbar radiculopathy, lumbar degenerative disc disease, lumbar spondylosis.Patient does have a disc bulge at L5-S1 on the left resulting in L5 radiculopathy and L5-S1 facet pathology.  Patient is status post left L4-L5 epidural steroid injection performed on 01/07/2018.  Patient states that the injection was only beneficial for approximately 2 days and then she had to go to the emergency department for worsening back pain.  We will not plan on repeating epidural steroid injection since the patient did not get any significant benefit from it.  Patient is obtaining mild benefit with Stokes 50 mg 3 times a day.  I will have her continue this dose as it is.  Patient also continues diclofenac 75 mg twice daily.    She is out of this medication  but I recommend that she discontinue this medication for 2 to 3 months.  Given that the patient was unable to get her Butrans patch filled secondary to insurance approval, we discussed hydrocodone 7.5 mg 3 times daily as needed for her chronic low back pain secondary to lumbar radiculopathy and lumbar degenerative disc disease.  Patient is tried various interventional treatments including epidural steroid injection, physical therapy along with trials of non-opioid analgesics none of which have been very effective.    Plan: -Hydrocodone 7.5 mg 3 times daily as needed as below.  Prescription provided for 2 months -Refill Stokes as below.  Future considerations: -Lumbar facet medial branch nerve blocks -Left SI joint injection  Interventional history: Left L4-L5 ESI on 01/07/2018 not helpful  Plan of Care  Pharmacotherapy (Medications Ordered): Meds ordered this encounter  Medications  . DISCONTD: HYDROcodone-acetaminophen (NORCO) 7.5-325 MG tablet    Sig: Take 1 tablet by mouth every 8 (eight) hours as needed for moderate pain. For chronic pain.  To fill on or after 03/18/2018, 04/16/2018    Dispense:  90 tablet    Refill:  0    Do not place this medication, or any other prescription from our practice, on "Automatic Refill". Patient may have prescription filled one day early if pharmacy is closed on scheduled refill date. Do not fill until:  To last until:  . HYDROcodone-acetaminophen (NORCO) 7.5-325 MG tablet    Sig: Take 1 tablet by mouth every 8 (eight) hours as needed for moderate pain. For chronic pain.  To fill on or after 03/18/2018, 04/16/2018  Dispense:  90 tablet    Refill:  0    Do not place this medication, or any other prescription from our practice, on "Automatic Refill". Patient may have prescription filled one day early if pharmacy is closed on scheduled refill date. Do not fill until:  To last until:  . pregabalin (Stokes) 50 MG capsule    Sig: Take 1 capsule (50 mg  total) by mouth 3 (three) times daily.    Dispense:  90 capsule    Refill:  1    Do not place this medication, or any other prescription from our practice, on "Automatic Refill". Patient may have prescription filled one day early if pharmacy is closed on scheduled refill date.   Provider-requested follow-up: Return in about 9 weeks (around 05/20/2018) for Medication Management.  Time Note: Greater than 50% of the 25 minute(s) of face-to-face time spent with Emily Stokes, was spent in counseling/coordination of care regarding: Emily Stokes's primary cause of pain, the treatment plan, treatment alternatives, medication side effects, the opioid analgesic risks and possible complications, the appropriate use of her medications, realistic expectations, the goals of pain management (increased in functionality), the medication agreement and the patient's responsibilities when it comes to controlled substances.  Future Appointments  Date Time Provider [place]  11/06/2018  2:00 PM [facility] [facility] None    Primary Care Physician: Margo Common, PA Location: [facility] Note by: Gillis Santa, M.D Date: 03/18/2018; Time: 3:05 PM  Patient Instructions  2 paper scripts handed to patient for hydrocodone to last until 05/16/2018.

## 2018-03-18 NOTE — Progress Notes (Signed)
Safety precautions to be maintained throughout the outpatient stay will include: orient to surroundings, keep bed in low position, maintain call bell within reach at all times, provide assistance with transfer out of bed and ambulation.  

## 2018-03-18 NOTE — Patient Instructions (Signed)
2 paper scripts handed to patient for hydrocodone to last until 05/16/2018.

## 2018-04-23 ENCOUNTER — Other Ambulatory Visit: Payer: Self-pay

## 2018-04-23 NOTE — Patient Outreach (Signed)
[facility]) Care Management  04/23/2018  Emily Stokes 14-Jun-1960 [id number]   Medication Adherence call to Mrs. Emily Stokes left a message for patient to call back patient is due on Metformin 500 mg. Mrs. Emily Stokes is showing past due under [place].   [place] Management Direct Dial [phone]  Fax [phone] [email or web address]

## 2018-04-30 ENCOUNTER — Encounter: Payer: Medicare Other | Admitting: Student in an Organized Health Care Education/Training Program

## 2018-05-24 ENCOUNTER — Other Ambulatory Visit: Payer: Self-pay | Admitting: Family Medicine

## 2018-05-27 ENCOUNTER — Encounter: Payer: Medicare Other | Admitting: Student in an Organized Health Care Education/Training Program

## 2018-06-17 ENCOUNTER — Other Ambulatory Visit: Payer: Self-pay | Admitting: Family Medicine

## 2018-06-17 ENCOUNTER — Other Ambulatory Visit: Payer: Self-pay | Admitting: Student in an Organized Health Care Education/Training Program

## 2018-06-17 DIAGNOSIS — I10 Essential (primary) hypertension: Secondary | ICD-10-CM

## 2018-07-15 NOTE — Progress Notes (Signed)
Patient: Emily Stokes Female    DOB: 04-08-1961   58 y.o.   MRN: [id number] Visit Date: 07/16/2018  Today's Provider: [name], PA   Chief Complaint  Patient presents with  . Urinary Tract Infection   Subjective:     Urinary Tract Infection   This is a recurrent problem. The current episode started in the past 7 days. The problem has been gradually worsening. The pain is at a severity of 8/10. The maximum temperature recorded prior to her arrival was 100 - 100.9 F. Associated symptoms include chills, flank pain, frequency and urgency. Pertinent negatives include no nausea or vomiting. She has tried nothing for the symptoms. Her past medical history is significant for recurrent UTIs.   Past Medical History:  Diagnosis Date  . Abdominal pain, epigastric   . Anemia   . Backache   . Bronchitis   . Calculus, kidney   . Carpal tunnel syndrome   . Chest pain   . Chicken pox   . Circulatory disease   . Disorder of kidney and ureter   . Excess, menstruation   . Hypercholesteremia   . Hypertension, essential, benign   . Infected postoperative seroma   . Iron deficiency   . Malaise and fatigue   . Measles   . MRSA (methicillin resistant staph aureus) culture positive   . Nausea alone   . Nonspecific abnormal electrocardiogram (ECG) (EKG)   . Pain in joint, lower leg    Past Surgical History:  Procedure Laterality Date  . ABDOMINAL HYSTERECTOMY    . CESAREAN SECTION    . COLONOSCOPY  1997  . COLONOSCOPY WITH PROPOFOL N/A 01/09/2018   Procedure: COLONOSCOPY WITH PROPOFOL;  Surgeon: Virgel Manifold, MD;  Location: [facility] ENDOSCOPY;  Service: Endoscopy;  Laterality: N/A;  . COLOSTOMY TAKEDOWN  2012  . Hartmann's procedure.    Marland Kitchen HERNIA REPAIR  0254   umbilical  . LITHOTRIPSY  2706   with complications, hospitalized due to these complications for 3 months  . mrsa     removal on nose  . PARTIAL HYSTERECTOMY  2009    vaginal hysterectomy, has both ovaries    Family History  Problem Relation Age of Onset  . Hypertension Father   . Stroke Father   . Diabetes Mother   . Hypertension Mother   . Diabetes Other        sibling  . Diabetes Other        sibling  . Diabetes Other        sibling  . Hypertension Other        sibling  . Hypertension Other        sibling  . Hypertension Other        sibling   No Known Allergies  Current Outpatient Medications:  .  amLODipine (NORVASC) 10 MG tablet, TAKE 1 TABLET BY MOUTH  DAILY, Disp: 90 tablet, Rfl: 0 .  aspirin 81 MG tablet, Take 81 mg by mouth daily., Disp: , Rfl:  .  atorvastatin (LIPITOR) 20 MG tablet, TAKE 1 TABLET BY MOUTH  DAILY, Disp: 90 tablet, Rfl: 1 .  glucose blood test strip, Test fasting blood sugar before breakfast daily. Recheck blood sugar if any symptoms of hypoglycemia., Disp: 100 each, Rfl: 4 .  HYDROcodone-acetaminophen (NORCO) 7.5-325 MG tablet, Take 1 tablet by mouth every 8 (eight) hours as needed for moderate pain. For chronic pain.  To fill on or after 03/18/2018, 04/16/2018, Disp:  90 tablet, Rfl: 0 .  metFORMIN (GLUCOPHAGE) 500 MG tablet, Take 1 tablet (500 mg total) by mouth 2 (two) times daily with a meal., Disp: 180 tablet, Rfl: 3 .  metoprolol tartrate (LOPRESSOR) 25 MG tablet, TAKE 1 TABLET BY MOUTH TWO  TIMES DAILY, Disp: 180 tablet, Rfl: 0 .  ondansetron (ZOFRAN ODT) 8 MG disintegrating tablet, Take 1 tablet (8 mg total) by mouth every 8 (eight) hours as needed for nausea or vomiting., Disp: 20 tablet, Rfl: 0 .  ONETOUCH DELICA LANCETS FINE MISC, Test fasting blood sugar each morning., Disp: 100 each, Rfl: 4 .  pregabalin (LYRICA) 50 MG capsule, Take 1 capsule (50 mg total) by mouth 3 (three) times daily., Disp: 90 capsule, Rfl: 1 .  cyclobenzaprine (FLEXERIL) 10 MG tablet, Take 10 mg by mouth 3 (three) times daily as needed for muscle spasms., Disp: , Rfl:  .  sulfamethoxazole-trimethoprim (BACTRIM DS,SEPTRA DS) 800-160 MG tablet, Take 1 tablet by mouth 2 (two)  times daily. (Patient not taking: Reported on 03/18/2018), Disp: 20 tablet, Rfl: 0  Review of Systems  Constitutional: Positive for chills. Negative for appetite change, fatigue and fever.  Respiratory: Negative for chest tightness and shortness of breath.   Cardiovascular: Negative for chest pain and palpitations.  Gastrointestinal: Negative for abdominal pain, nausea and vomiting.  Genitourinary: Positive for flank pain, frequency and urgency.  Neurological: Negative for dizziness and weakness.   Social History   Tobacco Use  . Smoking status: Never Smoker  . Smokeless tobacco: Never Used  Substance Use Topics  . Alcohol use: No    Alcohol/week: 0.0 standard drinks     Objective:   BP 122/84 (BP Location: Right Arm, Patient Position: Sitting, Cuff Size: Large)   Pulse 74   Temp 97.8 F (36.6 C) (Oral)   Wt 214 lb 6.4 oz (97.3 kg)   SpO2 95%   BMI 31.66 kg/m  Vitals:   07/16/18 1441  BP: 122/84  Pulse: 74  Temp: 97.8 F (36.6 C)  TempSrc: Oral  SpO2: 95%  Weight: 214 lb 6.4 oz (97.3 kg)   Physical Exam Constitutional:      General: She is not in acute distress.    Appearance: She is well-developed.  HENT:     Head: Normocephalic and atraumatic.     Right Ear: Hearing normal.     Left Ear: Hearing normal.     Nose: Nose normal.  Eyes:     General: Lids are normal. No scleral icterus.       Right eye: No discharge.        Left eye: No discharge.     Conjunctiva/sclera: Conjunctivae normal.  Neck:     Musculoskeletal: Neck supple.  Cardiovascular:     Rate and Rhythm: Normal rate and regular rhythm.     Heart sounds: Normal heart sounds.  Pulmonary:     Effort: Pulmonary effort is normal. No respiratory distress.  Abdominal:     General: Bowel sounds are normal.     Palpations: Abdomen is soft.     Tenderness: There is abdominal tenderness.     Comments: Suprapubic tenderness to palpation and mild CVA tenderness to percussion posteriorly.  Skin:     Findings: No lesion or rash.  Neurological:     Mental Status: She is alert and oriented to person, place, and time.  Psychiatric:        Speech: Speech normal.        Behavior: Behavior normal.  Thought Content: Thought content normal.       Assessment & Plan     1. Urinary frequency Onset over the past week. No gross hematuria but had some chills. History of lithotripsy for renal stones on the right 20+ years ago. Using Tylenol for chills and fever at home. Urine microscopy showed 2+ bacterial rods with many epithelial cells, WBC's and RBC's. Will get C&S and treat with antibiotic. Increase fluid intake and recheck pending culture report. - POCT urinalysis dipstick - Urine Culture - POCT UA - Microscopic Only  2. Acute cystitis without hematuria Bacteriuria on microscopic exam of urine. Some suprapubic tenderness with full bladder sensation to palpation. Treat with Septra-DS and Pyridium for discomfort. Recheck pending C&S report. - sulfamethoxazole-trimethoprim (BACTRIM DS,SEPTRA DS) 800-160 MG tablet; Take 1 tablet by mouth 2 (two) times daily.  Dispense: 20 tablet; Refill: 0 - phenazopyridine (PYRIDIUM) 200 MG tablet; Take 1 tablet (200 mg total) by mouth 3 (three) times daily as needed for pain.  Dispense: 12 tablet; Refill: [place], [place]  [facility]

## 2018-07-16 ENCOUNTER — Ambulatory Visit (INDEPENDENT_AMBULATORY_CARE_PROVIDER_SITE_OTHER): Payer: Medicare Other | Admitting: Family Medicine

## 2018-07-16 ENCOUNTER — Encounter: Payer: Self-pay | Admitting: Family Medicine

## 2018-07-16 VITALS — BP 122/84 | HR 74 | Temp 97.8°F | Wt 214.4 lb

## 2018-07-16 DIAGNOSIS — R350 Frequency of micturition: Secondary | ICD-10-CM | POA: Diagnosis not present

## 2018-07-16 DIAGNOSIS — N3000 Acute cystitis without hematuria: Secondary | ICD-10-CM | POA: Diagnosis not present

## 2018-07-16 LAB — POCT URINALYSIS DIPSTICK
Bilirubin, UA: NEGATIVE
Glucose, UA: POSITIVE — AB
Ketones, UA: NEGATIVE
Leukocytes, UA: NEGATIVE
Nitrite, UA: NEGATIVE
Protein, UA: NEGATIVE
Spec Grav, UA: 1.015 (ref 1.010–1.025)
Urobilinogen, UA: 0.2 E.U./dL
pH, UA: 7 (ref 5.0–8.0)

## 2018-07-16 LAB — POCT UA - MICROSCOPIC ONLY
Casts, Ur, LPF, POC: 0
Mucus, UA: 0
Yeast, UA: 0

## 2018-07-16 MED ORDER — PHENAZOPYRIDINE HCL 200 MG PO TABS: 200.0000 mg | ORAL_TABLET | Freq: Three times a day (TID) | ORAL | 0 refills | Status: DC | PRN

## 2018-07-16 MED ORDER — SULFAMETHOXAZOLE-TRIMETHOPRIM 800-160 MG PO TABS: 1.0000 | ORAL_TABLET | Freq: Two times a day (BID) | ORAL | 0 refills | Status: DC

## 2018-07-16 NOTE — Progress Notes (Deleted)
Patient: Emily Stokes Female    DOB: Mar 17, 1961   58 y.o.   MRN: [id number] Visit Date: 07/16/2018  Today's Provider: [name], PA   Chief Complaint  Patient presents with   Urinary Tract Infection   Subjective:     Urinary Tract Infection   This is a recurrent problem. The current episode started in the past 7 days. The problem has been gradually worsening. The pain is at a severity of 8/10. The maximum temperature recorded prior to her arrival was 100 - 100.9 F. Associated symptoms include chills, flank pain, frequency and urgency. Pertinent negatives include no nausea or vomiting. She has tried nothing for the symptoms. Her past medical history is significant for recurrent UTIs.    No Known Allergies   Current Outpatient Medications:    amLODipine (NORVASC) 10 MG tablet, TAKE 1 TABLET BY MOUTH  DAILY, Disp: 90 tablet, Rfl: 0   aspirin 81 MG tablet, Take 81 mg by mouth daily., Disp: , Rfl:    atorvastatin (LIPITOR) 20 MG tablet, TAKE 1 TABLET BY MOUTH  DAILY, Disp: 90 tablet, Rfl: 1   glucose blood test strip, Test fasting blood sugar before breakfast daily. Recheck blood sugar if any symptoms of hypoglycemia., Disp: 100 each, Rfl: 4   HYDROcodone-acetaminophen (NORCO) 7.5-325 MG tablet, Take 1 tablet by mouth every 8 (eight) hours as needed for moderate pain. For chronic pain.  To fill on or after 03/18/2018, 04/16/2018, Disp: 90 tablet, Rfl: 0   metFORMIN (GLUCOPHAGE) 500 MG tablet, Take 1 tablet (500 mg total) by mouth 2 (two) times daily with a meal., Disp: 180 tablet, Rfl: 3   metoprolol tartrate (LOPRESSOR) 25 MG tablet, TAKE 1 TABLET BY MOUTH TWO  TIMES DAILY, Disp: 180 tablet, Rfl: 0   ondansetron (ZOFRAN ODT) 8 MG disintegrating tablet, Take 1 tablet (8 mg total) by mouth every 8 (eight) hours as needed for nausea or vomiting., Disp: 20 tablet, Rfl: 0   ONETOUCH DELICA LANCETS FINE MISC, Test fasting blood sugar each morning., Disp: 100 each, Rfl: 4    pregabalin (LYRICA) 50 MG capsule, Take 1 capsule (50 mg total) by mouth 3 (three) times daily., Disp: 90 capsule, Rfl: 1   cyclobenzaprine (FLEXERIL) 10 MG tablet, Take 10 mg by mouth 3 (three) times daily as needed for muscle spasms., Disp: , Rfl:    sulfamethoxazole-trimethoprim (BACTRIM DS,SEPTRA DS) 800-160 MG tablet, Take 1 tablet by mouth 2 (two) times daily. (Patient not taking: Reported on 03/18/2018), Disp: 20 tablet, Rfl: 0  Review of Systems  Constitutional: Positive for chills.  Gastrointestinal: Positive for abdominal pain. Negative for nausea and vomiting.  Genitourinary: Positive for flank pain, frequency and urgency.    Social History   Tobacco Use   Smoking status: Never Smoker   Smokeless tobacco: Never Used  Substance Use Topics   Alcohol use: No    Alcohol/week: 0.0 standard drinks      Objective:   BP 122/84 (BP Location: Right Arm, Patient Position: Sitting, Cuff Size: Large)    Pulse 74    Temp 97.8 F (36.6 C) (Oral)    Wt 214 lb 6.4 oz (97.3 kg)    SpO2 95%    BMI 31.66 kg/m  Vitals:   07/16/18 1441  BP: 122/84  Pulse: 74  Temp: 97.8 F (36.6 C)  TempSrc: Oral  SpO2: 95%  Weight: 214 lb 6.4 oz (97.3 kg)     Physical Exam  Assessment & [place], PA  [facility]

## 2018-07-18 LAB — URINE CULTURE

## 2018-07-21 ENCOUNTER — Telehealth: Payer: Self-pay

## 2018-07-21 NOTE — Telephone Encounter (Signed)
LMTCB

## 2018-07-21 NOTE — Telephone Encounter (Signed)
Patient advised as directed below. 

## 2018-07-21 NOTE — Telephone Encounter (Signed)
-----   Message from Margo Common, [place] sent at 07/20/2018  6:13 PM EDT ----- Mixed bacterial growth on the urine culture. Finish all the antibiotic and recheck in a week if no better. Continue to drink extra fluids to flush out urinary tract.

## 2018-07-22 ENCOUNTER — Other Ambulatory Visit: Payer: Self-pay | Admitting: Family Medicine

## 2018-07-22 DIAGNOSIS — N3000 Acute cystitis without hematuria: Secondary | ICD-10-CM

## 2018-08-02 DIAGNOSIS — N12 Tubulo-interstitial nephritis, not specified as acute or chronic: Secondary | ICD-10-CM | POA: Diagnosis not present

## 2018-08-02 DIAGNOSIS — R1013 Epigastric pain: Secondary | ICD-10-CM | POA: Diagnosis not present

## 2018-08-02 DIAGNOSIS — Z7982 Long term (current) use of aspirin: Secondary | ICD-10-CM | POA: Diagnosis not present

## 2018-08-02 DIAGNOSIS — R112 Nausea with vomiting, unspecified: Secondary | ICD-10-CM | POA: Diagnosis not present

## 2018-08-02 DIAGNOSIS — Z79899 Other long term (current) drug therapy: Secondary | ICD-10-CM | POA: Diagnosis not present

## 2018-08-02 DIAGNOSIS — N10 Acute tubulo-interstitial nephritis: Secondary | ICD-10-CM | POA: Diagnosis not present

## 2018-08-02 DIAGNOSIS — R109 Unspecified abdominal pain: Secondary | ICD-10-CM | POA: Diagnosis not present

## 2018-08-02 DIAGNOSIS — R0602 Shortness of breath: Secondary | ICD-10-CM | POA: Diagnosis not present

## 2018-08-02 DIAGNOSIS — I10 Essential (primary) hypertension: Secondary | ICD-10-CM | POA: Diagnosis not present

## 2018-08-02 DIAGNOSIS — R509 Fever, unspecified: Secondary | ICD-10-CM | POA: Diagnosis not present

## 2018-08-02 DIAGNOSIS — Z7952 Long term (current) use of systemic steroids: Secondary | ICD-10-CM | POA: Diagnosis not present

## 2018-08-03 DIAGNOSIS — R109 Unspecified abdominal pain: Secondary | ICD-10-CM | POA: Diagnosis not present

## 2018-09-18 ENCOUNTER — Other Ambulatory Visit: Payer: Self-pay

## 2018-09-18 NOTE — Patient Outreach (Signed)
[facility]) Care Management  09/18/2018  Emily Stokes 01/06/1961 [id number]   Medication Adherence call to [name] spoke with patient she is due on Atorvastatin 20 mg and Metformin 500 mg patient is still taking 1 tablet daily on Atorvastatin on Metformin she is taking 2 tablets daily she ask if we can call [facility] an order both medication [facility] said patient owes $77 dollars and will not fill the medications until she pays for it,patient will be calling [facility]. [name] is showing past due under [place].   [place] Management Direct Dial [phone]  Fax [phone] [email or web address]

## 2018-09-29 ENCOUNTER — Other Ambulatory Visit: Payer: Self-pay

## 2018-09-29 ENCOUNTER — Other Ambulatory Visit: Payer: Self-pay | Admitting: Family Medicine

## 2018-09-29 ENCOUNTER — Telehealth: Payer: Self-pay

## 2018-09-29 DIAGNOSIS — E119 Type 2 diabetes mellitus without complications: Secondary | ICD-10-CM

## 2018-09-29 MED ORDER — GLUCOSE BLOOD VI STRP: ORAL_STRIP | 4 refills | Status: DC

## 2018-09-29 NOTE — Telephone Encounter (Signed)
Patient is requesting a refill on test strips and lancets.

## 2018-09-29 NOTE — Telephone Encounter (Signed)
Patient states her blood sugar is between  268-300 fasting. She is concerned with her numbers.

## 2018-10-16 ENCOUNTER — Other Ambulatory Visit: Payer: Self-pay | Admitting: Family Medicine

## 2018-10-16 DIAGNOSIS — E119 Type 2 diabetes mellitus without complications: Secondary | ICD-10-CM

## 2018-10-16 NOTE — Telephone Encounter (Signed)
Please review

## 2018-10-23 ENCOUNTER — Telehealth: Payer: Self-pay

## 2018-10-23 NOTE — Telephone Encounter (Signed)
Patient called back reporting that she uses a One touch Verio glucose meter.

## 2018-10-23 NOTE — Telephone Encounter (Signed)
Advise patient we sent in test strip order for the OneTouch Verio glucometer on 10-16-18 and OneTouch lancets to [facility] on 10-01-18. She may have to contact her insurance directly to be sure they received these orders or to see if they prefer she get a different glucometer.

## 2018-10-23 NOTE — Telephone Encounter (Signed)
Please review

## 2018-10-23 NOTE — Telephone Encounter (Signed)
Patient called stating that we sent in the wrong glucose test strips and wrong size lancets. She needs the correct strips and lancets sent into the pharmacy. Patient is looking for the name of her glucose meter, and will call back to let us know which meter she uses. Pharmacy: [facility]

## 2018-10-30 ENCOUNTER — Telehealth: Payer: Self-pay | Admitting: *Deleted

## 2018-10-30 NOTE — Telephone Encounter (Signed)
Patient called office stating she has UTI symptoms and wanted to know if an rx can be sent to pharmacy? Patient states if she needs an ov, let her know. Please advise?

## 2018-11-02 NOTE — Telephone Encounter (Signed)
Please advise 

## 2018-11-02 NOTE — Telephone Encounter (Signed)
Need appointment to check urinalysis.

## 2018-11-04 ENCOUNTER — Ambulatory Visit (INDEPENDENT_AMBULATORY_CARE_PROVIDER_SITE_OTHER): Payer: Medicare Other

## 2018-11-04 ENCOUNTER — Other Ambulatory Visit: Payer: Self-pay

## 2018-11-04 ENCOUNTER — Ambulatory Visit: Payer: Medicare Other

## 2018-11-04 DIAGNOSIS — Z0000 Encounter for general adult medical examination without abnormal findings: Secondary | ICD-10-CM

## 2018-11-04 DIAGNOSIS — Z1231 Encounter for screening mammogram for malignant neoplasm of breast: Secondary | ICD-10-CM | POA: Diagnosis not present

## 2018-11-04 NOTE — Telephone Encounter (Signed)
LMTCB ED 

## 2018-11-04 NOTE — Patient Instructions (Signed)
Emily Stokes , Thank you for taking time to come for your Medicare Wellness Visit. I appreciate your ongoing commitment to your health goals. Please review the following plan we discussed and let me know if I can assist you in the future.   Screening recommendations/referrals: Colonoscopy: Up to date, due 12/2018 Mammogram: Ordered today. Pt provided with contact info and advised to call to schedule appt. Pt aware the office will call re: appt. Recommended yearly ophthalmology/optometry visit for glaucoma screening and checkup Recommended yearly dental visit for hygiene and checkup  Vaccinations: Influenza vaccine: Up to date Tdap vaccine: Pt declines today.  Shingles vaccine: Pt declines today.     Advanced directives: Advance directive discussed with you today. Even though you declined this today please call our office should you change your mind and we can give you the proper paperwork for you to fill out.  Conditions/risks identified: Obesity- continue to work towards eating 3 small meals a day with two healthy snack in between.   Next appointment: 11/06/18 @ 10:00 AM with Simona Huh [place].   Preventive Care 40-64 Years, Female Preventive care refers to lifestyle choices and visits with your health care provider that can promote health and wellness. What does preventive care include?  A yearly physical exam. This is also called an annual well check.  Dental exams once or twice a year.  Routine eye exams. Ask your health care provider how often you should have your eyes checked.  Personal lifestyle choices, including:  Daily care of your teeth and gums.  Regular physical activity.  Eating a healthy diet.  Avoiding tobacco and drug use.  Limiting alcohol use.  Practicing safe sex.  Taking low-dose aspirin daily starting at age 70.  Taking vitamin and mineral supplements as recommended by your health care provider. What happens during an annual well check? The services  and screenings done by your health care provider during your annual well check will depend on your age, overall health, lifestyle risk factors, and family history of disease. Counseling  Your health care provider may ask you questions about your:  Alcohol use.  Tobacco use.  Drug use.  Emotional well-being.  Home and relationship well-being.  Sexual activity.  Eating habits.  Work and work Statistician.  Method of birth control.  Menstrual cycle.  Pregnancy history. Screening  You may have the following tests or measurements:  Height, weight, and BMI.  Blood pressure.  Lipid and cholesterol levels. These may be checked every 5 years, or more frequently if you are over 22 years old.  Skin check.  Lung cancer screening. You may have this screening every year starting at age 57 if you have a 30-pack-year history of smoking and currently smoke or have quit within the past 15 years.  Fecal occult blood test (FOBT) of the stool. You may have this test every year starting at age 50.  Flexible sigmoidoscopy or colonoscopy. You may have a sigmoidoscopy every 5 years or a colonoscopy every 10 years starting at age 19.  Hepatitis C blood test.  Hepatitis B blood test.  Sexually transmitted disease (STD) testing.  Diabetes screening. This is done by checking your blood sugar (glucose) after you have not eaten for a while (fasting). You may have this done every 1-3 years.  Mammogram. This may be done every 1-2 years. Talk to your health care provider about when you should start having regular mammograms. This may depend on whether you have a family history of breast cancer.  BRCA-related cancer screening. This may be done if you have a family history of breast, ovarian, tubal, or peritoneal cancers.  Pelvic exam and Pap test. This may be done every 3 years starting at age 34. Starting at age 63, this may be done every 5 years if you have a Pap test in combination with an HPV  test.  Bone density scan. This is done to screen for osteoporosis. You may have this scan if you are at high risk for osteoporosis. Discuss your test results, treatment options, and if necessary, the need for more tests with your health care provider. Vaccines  Your health care provider may recommend certain vaccines, such as:  Influenza vaccine. This is recommended every year.  Tetanus, diphtheria, and acellular pertussis (Tdap, Td) vaccine. You may need a Td booster every 10 years.  Zoster vaccine. You may need this after age 17.  Pneumococcal 13-valent conjugate (PCV13) vaccine. You may need this if you have certain conditions and were not previously vaccinated.  Pneumococcal polysaccharide (PPSV23) vaccine. You may need one or two doses if you smoke cigarettes or if you have certain conditions. Talk to your health care provider about which screenings and vaccines you need and how often you need them. This information is not intended to replace advice given to you by your health care provider. Make sure you discuss any questions you have with your health care provider. Document Released: 05/26/2015 Document Revised: 01/17/2016 Document Reviewed: 02/28/2015 Elsevier Interactive Patient Education  2017 [employer] Prevention in the Home Falls can cause injuries. They can happen to people of all ages. There are many things you can do to make your home safe and to help prevent falls. What can I do on the outside of my home?  Regularly fix the edges of walkways and driveways and fix any cracks.  Remove anything that might make you trip as you walk through a door, such as a raised step or threshold.  Trim any bushes or trees on the path to your home.  Use bright outdoor lighting.  Clear any walking paths of anything that might make someone trip, such as rocks or tools.  Regularly check to see if handrails are loose or broken. Make sure that both sides of any steps have  handrails.  Any raised decks and porches should have guardrails on the edges.  Have any leaves, snow, or ice cleared regularly.  Use sand or salt on walking paths during winter.  Clean up any spills in your garage right away. This includes oil or grease spills. What can I do in the bathroom?  Use night lights.  Install grab bars by the toilet and in the tub and shower. Do not use towel bars as grab bars.  Use non-skid mats or decals in the tub or shower.  If you need to sit down in the shower, use a plastic, non-slip stool.  Keep the floor dry. Clean up any water that spills on the floor as soon as it happens.  Remove soap buildup in the tub or shower regularly.  Attach bath mats securely with double-sided non-slip rug tape.  Do not have throw rugs and other things on the floor that can make you trip. What can I do in the bedroom?  Use night lights.  Make sure that you have a light by your bed that is easy to reach.  Do not use any sheets or blankets that are too big for your bed. They  should not hang down onto the floor.  Have a firm chair that has side arms. You can use this for support while you get dressed.  Do not have throw rugs and other things on the floor that can make you trip. What can I do in the kitchen?  Clean up any spills right away.  Avoid walking on wet floors.  Keep items that you use a lot in easy-to-reach places.  If you need to reach something above you, use a strong step stool that has a grab bar.  Keep electrical cords out of the way.  Do not use floor polish or wax that makes floors slippery. If you must use wax, use non-skid floor wax.  Do not have throw rugs and other things on the floor that can make you trip. What can I do with my stairs?  Do not leave any items on the stairs.  Make sure that there are handrails on both sides of the stairs and use them. Fix handrails that are broken or loose. Make sure that handrails are as long as  the stairways.  Check any carpeting to make sure that it is firmly attached to the stairs. Fix any carpet that is loose or worn.  Avoid having throw rugs at the top or bottom of the stairs. If you do have throw rugs, attach them to the floor with carpet tape.  Make sure that you have a light switch at the top of the stairs and the bottom of the stairs. If you do not have them, ask someone to add them for you. What else can I do to help prevent falls?  Wear shoes that:  Do not have high heels.  Have rubber bottoms.  Are comfortable and fit you well.  Are closed at the toe. Do not wear sandals.  If you use a stepladder:  Make sure that it is fully opened. Do not climb a closed stepladder.  Make sure that both sides of the stepladder are locked into place.  Ask someone to hold it for you, if possible.  Clearly mark and make sure that you can see:  Any grab bars or handrails.  First and last steps.  Where the edge of each step is.  Use tools that help you move around (mobility aids) if they are needed. These include:  Canes.  Walkers.  Scooters.  Crutches.  Turn on the lights when you go into a dark area. Replace any light bulbs as soon as they burn out.  Set up your furniture so you have a clear path. Avoid moving your furniture around.  If any of your floors are uneven, fix them.  If there are any pets around you, be aware of where they are.  Review your medicines with your doctor. Some medicines can make you feel dizzy. This can increase your chance of falling. Ask your doctor what other things that you can do to help prevent falls. This information is not intended to replace advice given to you by your health care provider. Make sure you discuss any questions you have with your health care provider. Document Released: 02/23/2009 Document Revised: 10/05/2015 Document Reviewed: 06/03/2014 Elsevier Interactive Patient Education  2017 [employer].

## 2018-11-04 NOTE — Progress Notes (Addendum)
Subjective:   Emily Stokes is a 58 y.o. female who presents for Medicare Annual (Subsequent) preventive examination.    This visit is being conducted through telemedicine due to the COVID-19 pandemic. This patient has given me verbal consent via doximity to conduct this visit, patient states they are participating from their home address. Some vital signs may be absent or patient reported.    Patient identification: identified by name, DOB, and current address  Review of Systems:  N/A  Cardiac Risk Factors include: diabetes mellitus;hypertension;obesity (BMI >30kg/m2)     Objective:     Vitals: There were no vitals taken for this visit.  There is no height or weight on file to calculate BMI. Unable to obtain vitals due to visit being conducted via telephonically.   Advanced Directives 11/04/2018 02/12/2018 01/07/2018 10/31/2017 07/07/2017 10/24/2016 01/22/2016  Does Patient Have a Medical Advance Directive? No No No No No No No  Would patient like information on creating a medical advance directive? No - Patient declined No - Patient declined No - Patient declined No - Patient declined - No - Patient declined -    Tobacco Social History   Tobacco Use  Smoking Status Never Smoker  Smokeless Tobacco Never Used     Counseling given: Not Answered   Clinical Intake:  Pre-visit preparation completed: Yes  Pain Score: 0-No pain     Nutritional Status: BMI > 30  Obese Nutritional Risks: None Diabetes: Yes  How often do you need to have someone help you when you read instructions, pamphlets, or other written materials from your doctor or pharmacy?: 1 - Never   Diabetes:  Is the patient diabetic?  Yes type 2 If diabetic, was a CBG obtained today?  No  Did the patient bring in their glucometer from home?  No  How often do you monitor your CBG's? Once a day.   Financial Strains and Diabetes Management:  Are you having any financial strains with the device, your supplies  or your medication? No .  Does the patient want to be seen by Chronic Care Management for management of their diabetes?  No  Would the patient like to be referred to a Nutritionist or for Diabetic Management?  No   Diabetic Exams:  Diabetic Eye Exam: Unsure when last exam was. Overdue for diabetic eye exam. Pt has been advised about the importance in completing this exam. Pt plans to set up an eye this year.  Diabetic Foot Exam: Completed 01/30/18. Complete yearly.   Interpreter Needed?: No  Information entered by :: MMarkoski, LPN  Past Medical History:  Diagnosis Date  . Abdominal pain, epigastric   . Anemia   . Backache   . Bronchitis   . Calculus, kidney   . Carpal tunnel syndrome   . Chest pain   . Chicken pox   . Circulatory disease   . Disorder of kidney and ureter   . Excess, menstruation   . Hypercholesteremia   . Hypertension, essential, benign   . Infected postoperative seroma   . Iron deficiency   . Malaise and fatigue   . Measles   . MRSA (methicillin resistant staph aureus) culture positive   . Nausea alone   . Nonspecific abnormal electrocardiogram (ECG) (EKG)   . Pain in joint, lower leg    Past Surgical History:  Procedure Laterality Date  . ABDOMINAL HYSTERECTOMY    . CESAREAN SECTION    . COLONOSCOPY  1997  . COLONOSCOPY WITH PROPOFOL N/A  01/09/2018   Procedure: COLONOSCOPY WITH PROPOFOL;  Surgeon: Virgel Manifold, MD;  Location: [facility] ENDOSCOPY;  Service: Endoscopy;  Laterality: N/A;  . COLOSTOMY TAKEDOWN  2012  . Hartmann's procedure.    Marland Kitchen HERNIA REPAIR  9233   umbilical  . LITHOTRIPSY  0076   with complications, hospitalized due to these complications for 3 months  . mrsa     removal on nose  . PARTIAL HYSTERECTOMY  2009    vaginal hysterectomy, has both ovaries   Family History  Problem Relation Age of Onset  . Hypertension Father   . Stroke Father   . Diabetes Mother   . Hypertension Mother   . Diabetes Other        sibling   . Diabetes Other        sibling  . Diabetes Other        sibling  . Hypertension Other        sibling  . Hypertension Other        sibling  . Hypertension Other        sibling   Social History   Socioeconomic History  . Marital status: Widowed    Spouse name: Not on file  . Number of children: 4  . Years of education: Not on file  . Highest education level: Bachelor's degree (e.g., BA, AB, BS)  Occupational History  . Occupation: Control and instrumentation engineer    Comment: disability  Social Needs  . Financial resource strain: Somewhat hard  . Food insecurity    Worry: Never true    Inability: Never true  . Transportation needs    Medical: No    Non-medical: No  Tobacco Use  . Smoking status: Never Smoker  . Smokeless tobacco: Never Used  Substance and Sexual Activity  . Alcohol use: No    Alcohol/week: 0.0 standard drinks  . Drug use: No  . Sexual activity: Never  Lifestyle  . Physical activity    Days per week: 0 days    Minutes per session: 0 min  . Stress: Not at all  Relationships  . Social Herbalist on phone: Patient refused    Gets together: Patient refused    Attends religious service: Patient refused    Active member of club or organization: Patient refused    Attends meetings of clubs or organizations: Patient refused    Relationship status: Patient refused  Other Topics Concern  . Not on file  Social History Narrative  . Not on file    Outpatient Encounter Medications as of 11/04/2018  Medication Sig  . amLODipine (NORVASC) 10 MG tablet TAKE 1 TABLET BY MOUTH  DAILY  . aspirin 81 MG tablet Take 81 mg by mouth daily.  Marland Kitchen atorvastatin (LIPITOR) 20 MG tablet TAKE 1 TABLET BY MOUTH  DAILY  . cyclobenzaprine (FLEXERIL) 10 MG tablet Take 10 mg by mouth 3 (three) times daily as needed for muscle spasms.  . Lancets (ONETOUCH ULTRASOFT) lancets TEST BLOOD SUGAR BEFORE BREAKFAST DAILY. RETEST BLOOD SUGAR IF HAVING SYMPTOMS OF HYPOGLYCEMIA.  . metFORMIN  (GLUCOPHAGE) 500 MG tablet TAKE 1 TABLET BY MOUTH TWO  TIMES DAILY WITH A MEAL  . metoprolol tartrate (LOPRESSOR) 25 MG tablet TAKE 1 TABLET BY MOUTH TWO  TIMES DAILY  . ondansetron (ZOFRAN ODT) 8 MG disintegrating tablet Take 1 tablet (8 mg total) by mouth every 8 (eight) hours as needed for nausea or vomiting.  Glory Rosebush VERIO test strip TEST BLOOD GLUCOSE BEFORE  BREAKFAST DAILY . RECHECK  IF ANY SYMPTOMS OF  HYPOGLYCEMIA  . pregabalin (LYRICA) 50 MG capsule Take 1 capsule (50 mg total) by mouth 3 (three) times daily.  Marland Kitchen HYDROcodone-acetaminophen (NORCO) 7.5-325 MG tablet Take 1 tablet by mouth every 8 (eight) hours as needed for moderate pain. For chronic pain.  To fill on or after 03/18/2018, 04/16/2018 (Patient not taking: Reported on 11/04/2018)  . phenazopyridine (PYRIDIUM) 200 MG tablet TAKE 1 TABLET BY MOUTH THREE TIMES A DAY AS NEEDED FOR PAIN (Patient not taking: Reported on 11/04/2018)  . sulfamethoxazole-trimethoprim (BACTRIM DS,SEPTRA DS) 800-160 MG tablet Take 1 tablet by mouth 2 (two) times daily. (Patient not taking: Reported on 11/04/2018)   No facility-administered encounter medications on file as of 11/04/2018.     Activities of Daily Living In your present state of health, do you have any difficulty performing the following activities: 11/04/2018  Hearing? N  Vision? Y  Comment Has blurred vision occasionally. Pt plans to set up an eye exam this year.  Difficulty concentrating or making decisions? N  Walking or climbing stairs? Y  Comment Due to leg pains.  Dressing or bathing? N  Doing errands, shopping? N  Preparing Food and eating ? N  Using the Toilet? N  In the past six months, have you accidently leaked urine? Y  Comment Pt concerned she may have a UTI. Pt to set up apt.  Do you have problems with loss of bowel control? N  Managing your Medications? N  Managing your Finances? N  Housekeeping or managing your Housekeeping? N  Some recent data might be hidden     Patient Care Team: Chrismon, [name], PA as PCP - General (Physician Assistant)    Assessment:   This is a routine wellness examination for Ara.  Exercise Activities and Dietary recommendations Current Exercise Habits: The patient does not participate in regular exercise at present, Exercise limited by: orthopedic condition(s)  Goals    . Exercise 2x per week (45 min per time)     Recommend increasing exercise weekly. Pt to start physical therapy 2 times a week for 45 minutes.     . Have 3 meals a day     Recommend to start eating 3 meals a day with 2 healthy snacks in between.       Fall Risk: Fall Risk  11/04/2018 03/18/2018 02/12/2018 01/07/2018 12/23/2017  Falls in the past year? 0 0 No No Yes  Number falls in past yr: - - - - 2 or more  Comment - - - - -  Injury with Fall? - - - - Yes  Comment - - - - left leg gave out and she did seek medical attention at [facility]  Follow up - - - - -    [place]:  Any stairs in or around the home? Yes  If so, are there any without handrails? No   Home free of loose throw rugs in walkways, pet beds, electrical cords, etc? Yes  Adequate lighting in your home to reduce risk of falls? Yes   ASSISTIVE DEVICES UTILIZED TO PREVENT FALLS:  Life alert? No  Use of a cane, walker or w/c? No  Grab bars in the bathroom? No  Shower chair or bench in shower? No  Elevated toilet seat or a handicapped toilet? No   TIMED UP AND GO:  Was the test performed? No .    Depression Screen PHQ 2/9 Scores 11/04/2018  03/18/2018 02/12/2018 01/07/2018  PHQ - 2 Score 0 0 0 0  PHQ- 9 Score - - - -     Cognitive Function: Declined today.      6CIT Screen 10/24/2016  What Year? 0 points  What month? 0 points  What time? 0 points  Count back from 20 0 points  Months in reverse 0 points  Repeat phrase 6 points  Total Score 6    Immunization History  Administered Date(s) Administered  . Influenza Split  05/26/2007  . Influenza,inj,Quad PF,6+ Mos 05/31/2016, 05/19/2017, 01/30/2018  . Pneumococcal Polysaccharide-23 01/30/2018    Tdap: Although this vaccine is not a covered service during a Wellness Exam, does the patient still wish to receive this vaccine today?  No .  Education has been provided regarding the importance of this vaccine. Advised may receive this vaccine at local pharmacy or Health Dept. Aware to provide a copy of the vaccination record if obtained from local pharmacy or Health Dept. Verbalized acceptance and understanding.  Flu Vaccine: Up to date  Screening Tests Health Maintenance  Topic Date Due  . OPHTHALMOLOGY EXAM  11/23/1969  . TETANUS/TDAP  11/24/1978  . PAP SMEAR-Modifier  11/23/1980  . MAMMOGRAM  11/23/2009  . HEMOGLOBIN A1C  07/31/2018  . INFLUENZA VACCINE  12/12/2018  . COLONOSCOPY  01/10/2019  . FOOT EXAM  01/31/2019  . URINE MICROALBUMIN  01/31/2019  . PNEUMOCOCCAL POLYSACCHARIDE VACCINE AGE 35-64 HIGH RISK  Completed  . Hepatitis C Screening  Completed  . HIV Screening  Completed    Cancer Screenings:  Colorectal Screening: Completed 01/09/18. Repeat every this year.  Mammogram: Ordered today. Pt provided with contact info and advised to call to schedule appt. Pt aware the office will call re: appt.   Lung Cancer Screening: (Low Dose CT Chest recommended if Age 53-80 years, 30 pack-year currently smoking OR have quit w/in 15years.) does not qualify.   Additional Screening:  Hepatitis C Screening: Up to date  Dental Screening: Recommended annual dental exams for proper oral hygiene   Community Resource Referral:  CRR required this visit?  Yes for financial assistance with paying bills.     Plan:  I have personally reviewed and addressed the Medicare Annual Wellness questionnaire and have noted the following in the patient's chart:  A. Medical and social history B. Use of alcohol, tobacco or illicit drugs  C. Current medications and  supplements D. Functional ability and status E.  Nutritional status F.  Physical activity G. Advance directives H. List of other physicians I.  Hospitalizations, surgeries, and ER visits in previous 12 months J.  [place] such as hearing and vision if needed, cognitive and depression L. Referrals and appointments   In addition, I have reviewed and discussed with patient certain preventive protocols, quality metrics, and best practice recommendations. A written personalized care plan for preventive services as well as general preventive health recommendations were provided to patient.   [place]ora Score, [place]  12/21/9145 Nurse Health Advisor   Nurse Notes: Pt needs a Hgb A1c checked at next OV. Pt to set up an eye exam this year. Scheduled an OV for a possible UTI. Apt made for 11/06/18 @ 10:00 AM   Reviewed Nurse Health Advisor note and plan. Agree with documentation and recommendations.

## 2018-11-06 ENCOUNTER — Other Ambulatory Visit: Payer: Self-pay

## 2018-11-06 ENCOUNTER — Ambulatory Visit: Payer: Medicare Other

## 2018-11-06 ENCOUNTER — Encounter: Payer: Self-pay | Admitting: Family Medicine

## 2018-11-06 ENCOUNTER — Ambulatory Visit (INDEPENDENT_AMBULATORY_CARE_PROVIDER_SITE_OTHER): Payer: Medicare Other | Admitting: Family Medicine

## 2018-11-06 ENCOUNTER — Telehealth: Payer: Self-pay

## 2018-11-06 VITALS — BP 110/64 | HR 79 | Temp 98.4°F | Resp 16 | Wt 241.0 lb

## 2018-11-06 DIAGNOSIS — E7800 Pure hypercholesterolemia, unspecified: Secondary | ICD-10-CM | POA: Diagnosis not present

## 2018-11-06 DIAGNOSIS — N3000 Acute cystitis without hematuria: Secondary | ICD-10-CM | POA: Diagnosis not present

## 2018-11-06 DIAGNOSIS — E119 Type 2 diabetes mellitus without complications: Secondary | ICD-10-CM

## 2018-11-06 DIAGNOSIS — I10 Essential (primary) hypertension: Secondary | ICD-10-CM | POA: Diagnosis not present

## 2018-11-06 DIAGNOSIS — R350 Frequency of micturition: Secondary | ICD-10-CM | POA: Diagnosis not present

## 2018-11-06 LAB — POCT URINALYSIS DIPSTICK
Appearance: ABNORMAL
Bilirubin, UA: NEGATIVE
Glucose, UA: POSITIVE — AB
Ketones, UA: NEGATIVE
Nitrite, UA: NEGATIVE
Odor: NORMAL
Protein, UA: POSITIVE — AB
Spec Grav, UA: 1.01 (ref 1.010–1.025)
Urobilinogen, UA: 0.2 E.U./dL
pH, UA: 6 (ref 5.0–8.0)

## 2018-11-06 MED ORDER — PHENAZOPYRIDINE HCL 200 MG PO TABS: ORAL_TABLET | ORAL | 0 refills | Status: DC

## 2018-11-06 MED ORDER — CIPROFLOXACIN HCL 500 MG PO TABS: 500.0000 mg | ORAL_TABLET | Freq: Two times a day (BID) | ORAL | 0 refills | Status: DC

## 2018-11-06 NOTE — Telephone Encounter (Signed)
11/06/2018 Spoke with patient about community resources for financial assistance with medical bills, utility bills and food banks. MA

## 2018-11-06 NOTE — Progress Notes (Signed)
Patient: Emily Stokes Female    DOB: 1960/10/15   58 y.o.   MRN: [id number] Visit Date: 11/06/2018  Today's Provider: [name], PA   Chief Complaint  Patient presents with  . Urinary Frequency   Subjective:     Urinary Frequency  This is a new problem. The current episode started more than 1 year ago (3 months). The problem has been unchanged. The quality of the pain is described as aching. There has been no fever. Associated symptoms include chills, frequency, hesitancy and urgency. Pertinent negatives include no discharge, flank pain, hematuria, nausea, possible pregnancy or vomiting. She has tried NSAIDs for the symptoms. The treatment provided no relief.   Patient states she has had urinary frequency and low back pan for about 3 months. Patient was treated for UTI symptoms 08/02/2018 at ED. Patient states after competing antibiotic symptoms did not go away. Patient also has symptoms of chills and urgency. Patient has been taking ibuprofen with no relief.  Past Medical History:  Diagnosis Date  . Abdominal pain, epigastric   . Anemia   . Backache   . Bronchitis   . Calculus, kidney   . Carpal tunnel syndrome   . Chest pain   . Chicken pox   . Circulatory disease   . Disorder of kidney and ureter   . Excess, menstruation   . Hypercholesteremia   . Hypertension, essential, benign   . Infected postoperative seroma   . Iron deficiency   . Malaise and fatigue   . Measles   . MRSA (methicillin resistant staph aureus) culture positive   . Nausea alone   . Nonspecific abnormal electrocardiogram (ECG) (EKG)   . Pain in joint, lower leg    Past Surgical History:  Procedure Laterality Date  . ABDOMINAL HYSTERECTOMY    . CESAREAN SECTION    . COLONOSCOPY  1997  . COLONOSCOPY WITH PROPOFOL N/A 01/09/2018   Procedure: COLONOSCOPY WITH PROPOFOL;  Surgeon: Virgel Manifold, MD;  Location: [facility] ENDOSCOPY;  Service: Endoscopy;  Laterality: N/A;  . COLOSTOMY  TAKEDOWN  2012  . Hartmann's procedure.    Marland Kitchen HERNIA REPAIR  2878   umbilical  . LITHOTRIPSY  6767   with complications, hospitalized due to these complications for 3 months  . mrsa     removal on nose  . PARTIAL HYSTERECTOMY  2009    vaginal hysterectomy, has both ovaries  ' Family History  Problem Relation Age of Onset  . Hypertension Father   . Stroke Father   . Diabetes Mother   . Hypertension Mother   . Diabetes Other        sibling  . Diabetes Other        sibling  . Diabetes Other        sibling  . Hypertension Other        sibling  . Hypertension Other        sibling  . Hypertension Other        sibling   No Known Allergies  Current Outpatient Medications:  .  amLODipine (NORVASC) 10 MG tablet, TAKE 1 TABLET BY MOUTH  DAILY, Disp: 90 tablet, Rfl: 0 .  aspirin 81 MG tablet, Take 81 mg by mouth daily., Disp: , Rfl:  .  atorvastatin (LIPITOR) 20 MG tablet, TAKE 1 TABLET BY MOUTH  DAILY, Disp: 90 tablet, Rfl: 1 .  cyclobenzaprine (FLEXERIL) 10 MG tablet, Take 10 mg by mouth 3 (three) times daily  as needed for muscle spasms., Disp: , Rfl:  .  Lancets (ONETOUCH ULTRASOFT) lancets, TEST BLOOD SUGAR BEFORE BREAKFAST DAILY. RETEST BLOOD SUGAR IF HAVING SYMPTOMS OF HYPOGLYCEMIA., Disp: 100 each, Rfl: 3 .  metFORMIN (GLUCOPHAGE) 500 MG tablet, TAKE 1 TABLET BY MOUTH TWO  TIMES DAILY WITH A MEAL, Disp: 180 tablet, Rfl: 0 .  metoprolol tartrate (LOPRESSOR) 25 MG tablet, TAKE 1 TABLET BY MOUTH TWO  TIMES DAILY, Disp: 180 tablet, Rfl: 0 .  ondansetron (ZOFRAN ODT) 8 MG disintegrating tablet, Take 1 tablet (8 mg total) by mouth every 8 (eight) hours as needed for nausea or vomiting., Disp: 20 tablet, Rfl: 0 .  ONETOUCH VERIO test strip, TEST BLOOD GLUCOSE BEFORE  BREAKFAST DAILY . RECHECK  IF ANY SYMPTOMS OF  HYPOGLYCEMIA, Disp: 100 each, Rfl: 3 .  pregabalin (LYRICA) 50 MG capsule, Take 1 capsule (50 mg total) by mouth 3 (three) times daily., Disp: 90 capsule, Rfl: 1 .   ciprofloxacin (CIPRO) 500 MG tablet, Take 1 tablet (500 mg total) by mouth 2 (two) times daily., Disp: 20 tablet, Rfl: 0 .  HYDROcodone-acetaminophen (NORCO) 7.5-325 MG tablet, Take 1 tablet by mouth every 8 (eight) hours as needed for moderate pain. For chronic pain.  To fill on or after 03/18/2018, 04/16/2018 (Patient not taking: Reported on 11/04/2018), Disp: 90 tablet, Rfl: 0 .  phenazopyridine (PYRIDIUM) 200 MG tablet, TAKE 1 TABLET BY MOUTH THREE TIMES A DAY AS NEEDED FOR PAIN, Disp: 12 tablet, Rfl: 0  Review of Systems  Constitutional: Positive for chills. Negative for appetite change, fatigue and fever.  Respiratory: Negative for chest tightness and shortness of breath.   Cardiovascular: Negative for chest pain and palpitations.  Gastrointestinal: Negative for abdominal pain, nausea and vomiting.  Genitourinary: Positive for frequency, hesitancy and urgency. Negative for flank pain and hematuria.  Neurological: Negative for dizziness and weakness.   Social History   Tobacco Use  . Smoking status: Never Smoker  . Smokeless tobacco: Never Used  Substance Use Topics  . Alcohol use: No    Alcohol/week: 0.0 standard drinks     Objective:   BP 110/64 (BP Location: Right Arm, Patient Position: Sitting, Cuff Size: Large)   Pulse 79   Temp 98.4 F (36.9 C) (Oral)   Resp 16   Wt 241 lb (109.3 kg)   SpO2 98%   BMI 35.59 kg/m    Wt Readings from Last 3 Encounters:  11/06/18 241 lb (109.3 kg)  07/16/18 214 lb 6.4 oz (97.3 kg)  03/18/18 230 lb (104.3 kg)   Vitals:   11/06/18 1028  BP: 110/64  Pulse: 79  Resp: 16  Temp: 98.4 F (36.9 C)  TempSrc: Oral  SpO2: 98%  Weight: 241 lb (109.3 kg)   Physical Exam Constitutional:      General: She is not in acute distress.    Appearance: She is well-developed.  HENT:     Head: Normocephalic and atraumatic.     Right Ear: Hearing normal.     Left Ear: Hearing normal.     Nose: Nose normal.  Eyes:     General: Lids are normal. No  scleral icterus.       Right eye: No discharge.        Left eye: No discharge.     Conjunctiva/sclera: Conjunctivae normal.  Neck:     Musculoskeletal: Neck supple.  Cardiovascular:     Rate and Rhythm: Normal rate and regular rhythm.     Pulses: Normal pulses.  Heart sounds: Normal heart sounds.  Pulmonary:     Effort: Pulmonary effort is normal. No respiratory distress.     Breath sounds: Normal breath sounds.  Abdominal:     General: Bowel sounds are normal.     Palpations: Abdomen is soft. There is no mass.     Tenderness: There is abdominal tenderness. There is no right CVA tenderness, left CVA tenderness or guarding.     Comments: Suprapubic tenderness to palpation. No CVA tenderness to percussion posteriorly.  Skin:    Findings: No lesion or rash.  Neurological:     Mental Status: She is alert and oriented to person, place, and time.  Psychiatric:        Speech: Speech normal.        Behavior: Behavior normal.        Thought Content: Thought content normal.       Assessment & Plan    1. Acute cystitis without hematuria Had treatment for kidney infection in the ER on 08-02-18 with Bactrim-DS and Pyridium. Symptoms of suprapubic discomfort, frequency and urgency with some chills continues to occur. Denies any fever or gross hematuria. Urinalysis showed TNTC WBC's and bacteria on microscopic exam. Will give Cipro and refill Pyridium. Encouraged to drink extra water in diet and start a culture with CBC and CMP. - POCT urinalysis dipstick - CULTURE, URINE COMPREHENSIVE - CBC with Differential/Platelet - Comprehensive metabolic panel - ciprofloxacin (CIPRO) 500 MG tablet; Take 1 tablet (500 mg total) by mouth 2 (two) times daily.  Dispense: 20 tablet; Refill: 0 - phenazopyridine (PYRIDIUM) 200 MG tablet; TAKE 1 TABLET BY MOUTH THREE TIMES A DAY AS NEEDED FOR PAIN  Dispense: 12 tablet; Refill: 0  2. Type 2 diabetes mellitus without complication, without long-term current use  of insulin (HCC) Having difficulty maintaining diet in this pandemic time. Noticing polydipsia and dry mouth symptoms. Working on getting ophthalmology exam. Normal foot exam today. Still taking Metformin 500 mg BID and statin. Check routine follow up labs and schedule follow up pending reports. - CBC with Differential/Platelet - Comprehensive metabolic panel - Hemoglobin A1c - Lipid panel  3. Hypercholesteremia Tolerating Atorvastatin 20 mg qd. Has gained 27 lbs since 07-16-18. Unable to exercise much due to chronic back pain. Must work on the low fat diabetic diet and will recheck labs today. - Comprehensive metabolic panel - Lipid panel - TSH     [name], PA  [facility]

## 2018-11-06 NOTE — Telephone Encounter (Signed)
Patient was seen in office today.  

## 2018-11-07 LAB — CBC WITH DIFFERENTIAL/PLATELET
Basophils Absolute: 0 10*3/uL (ref 0.0–0.2)
Basos: 0 %
EOS (ABSOLUTE): 0.1 10*3/uL (ref 0.0–0.4)
Eos: 1 %
Hematocrit: 34.3 % (ref 34.0–46.6)
Hemoglobin: 11.6 g/dL (ref 11.1–15.9)
Immature Grans (Abs): 0 10*3/uL (ref 0.0–0.1)
Immature Granulocytes: 0 %
Lymphocytes Absolute: 2.2 10*3/uL (ref 0.7–3.1)
Lymphs: 38 %
MCH: 31.8 pg (ref 26.6–33.0)
MCHC: 33.8 g/dL (ref 31.5–35.7)
MCV: 94 fL (ref 79–97)
Monocytes Absolute: 0.4 10*3/uL (ref 0.1–0.9)
Monocytes: 8 %
Neutrophils Absolute: 3 10*3/uL (ref 1.4–7.0)
Neutrophils: 53 %
Platelets: 358 10*3/uL (ref 150–450)
RBC: 3.65 x10E6/uL — ABNORMAL LOW (ref 3.77–5.28)
RDW: 12.8 % (ref 11.7–15.4)
WBC: 5.7 10*3/uL (ref 3.4–10.8)

## 2018-11-07 LAB — COMPREHENSIVE METABOLIC PANEL
ALT: 13 IU/L (ref 0–32)
AST: 9 IU/L (ref 0–40)
Albumin/Globulin Ratio: 1.2 (ref 1.2–2.2)
Albumin: 4.2 g/dL (ref 3.8–4.9)
Alkaline Phosphatase: 139 IU/L — ABNORMAL HIGH (ref 39–117)
BUN/Creatinine Ratio: 16 (ref 9–23)
BUN: 19 mg/dL (ref 6–24)
Bilirubin Total: 0.3 mg/dL (ref 0.0–1.2)
CO2: 19 mmol/L — ABNORMAL LOW (ref 20–29)
Calcium: 9.6 mg/dL (ref 8.7–10.2)
Chloride: 99 mmol/L (ref 96–106)
Creatinine, Ser: 1.21 mg/dL — ABNORMAL HIGH (ref 0.57–1.00)
GFR calc Af Amer: 57 mL/min/{1.73_m2} — ABNORMAL LOW (ref >59)
GFR calc non Af Amer: 49 mL/min/{1.73_m2} — ABNORMAL LOW (ref >59)
Globulin, Total: 3.5 g/dL (ref 1.5–4.5)
Glucose: 388 mg/dL — ABNORMAL HIGH (ref 65–99)
Potassium: 4.2 mmol/L (ref 3.5–5.2)
Sodium: 133 mmol/L — ABNORMAL LOW (ref 134–144)
Total Protein: 7.7 g/dL (ref 6.0–8.5)

## 2018-11-07 LAB — LIPID PANEL
Chol/HDL Ratio: 4.4 ratio (ref 0.0–4.4)
Cholesterol, Total: 140 mg/dL (ref 100–199)
HDL: 32 mg/dL — ABNORMAL LOW (ref >39)
LDL Calculated: 80 mg/dL (ref 0–99)
Triglycerides: 140 mg/dL (ref 0–149)
VLDL Cholesterol Cal: 28 mg/dL (ref 5–40)

## 2018-11-07 LAB — HEMOGLOBIN A1C
Est. average glucose Bld gHb Est-mCnc: 292 mg/dL
Hgb A1c MFr Bld: 11.8 % — ABNORMAL HIGH (ref 4.8–5.6)

## 2018-11-07 LAB — TSH: TSH: 1.6 u[IU]/mL (ref 0.450–4.500)

## 2018-11-09 ENCOUNTER — Telehealth: Payer: Self-pay

## 2018-11-09 NOTE — Telephone Encounter (Signed)
-----   Message from Margo Common, [place] sent at 11/09/2018  8:16 AM EDT ----- Blood sugar very high and Hgb A1C indicating diabetes very much out of control. Cholesterol and thyroid function essentially normal except HDL a little low. Preliminary urine culture report indicates infection by a bacterial rod that may be sensitive to the Cipro given. Need to increase Metformin to 1000 mg BID and recheck fasting blood sugar daily. Follow up appointment with daily readings in a month to see if more medications may be needed. Focus on a better diabetic diet and walking for exercise if possible.

## 2018-11-09 NOTE — Telephone Encounter (Signed)
Patient was advised.  

## 2018-11-10 LAB — CULTURE, URINE COMPREHENSIVE

## 2018-11-12 ENCOUNTER — Telehealth: Payer: Self-pay | Admitting: Family Medicine

## 2018-11-12 NOTE — Telephone Encounter (Signed)
Patient advised.  [place]

## 2018-11-12 NOTE — Telephone Encounter (Signed)
-----   Message from Margo Common, [place] sent at 11/10/2018 12:38 PM EDT ----- Urine culture isolated Pseudomonas bacteria that is susceptible to all the antibiotics tested (including the Cipro she was given). Continue increased water intake to flush out infection and recheck urinalysis if symptoms persist after finishing the Cipro.

## 2018-11-26 ENCOUNTER — Telehealth: Payer: Self-pay | Admitting: Family Medicine

## 2018-11-26 NOTE — Telephone Encounter (Signed)
Need to recheck urinalysis and C&S to rule out a new bacterial infection or development of resistance to the antibiotic given on 11-06-18 for the pseudomonas infection.

## 2018-11-26 NOTE — Telephone Encounter (Signed)
Please review

## 2018-11-26 NOTE — Telephone Encounter (Signed)
Pt called saying she has finished the medication for the UTI but she is still having symptoms  CB# [id number]  [place]

## 2018-11-27 ENCOUNTER — Other Ambulatory Visit: Payer: Self-pay

## 2018-11-27 DIAGNOSIS — E119 Type 2 diabetes mellitus without complications: Secondary | ICD-10-CM

## 2018-11-27 MED ORDER — ONETOUCH ULTRASOFT LANCETS MISC: 3 refills | Status: DC

## 2018-11-27 NOTE — Telephone Encounter (Signed)
Done

## 2018-12-01 ENCOUNTER — Telehealth: Payer: Self-pay

## 2018-12-01 NOTE — Telephone Encounter (Signed)
12/01/2018 Spoke with patient about community resources to assist with utilities and medical bills.  Verified patients email and emailed resources to [email or web address].  Gave patient my name and number to call if any further needs. Emily Stokes [phone]

## 2018-12-03 ENCOUNTER — Ambulatory Visit: Payer: Medicare Other

## 2018-12-04 ENCOUNTER — Ambulatory Visit: Payer: Self-pay | Admitting: Family Medicine

## 2018-12-04 ENCOUNTER — Other Ambulatory Visit: Payer: Self-pay | Admitting: Family Medicine

## 2018-12-04 DIAGNOSIS — I10 Essential (primary) hypertension: Secondary | ICD-10-CM

## 2018-12-04 DIAGNOSIS — E119 Type 2 diabetes mellitus without complications: Secondary | ICD-10-CM

## 2018-12-09 ENCOUNTER — Telehealth: Payer: Self-pay

## 2018-12-09 NOTE — Telephone Encounter (Signed)
12/09/2018 Spoke with patient about resources for financial assistance for utilities.  She has filled out the application for the [employer] program. She is waiting for a call back from them and has the number to call. She also plans to call the [employer] for assistance.  Ambrose Mantle [phone]

## 2018-12-14 ENCOUNTER — Ambulatory Visit (INDEPENDENT_AMBULATORY_CARE_PROVIDER_SITE_OTHER): Payer: Medicare Other | Admitting: Family Medicine

## 2018-12-14 ENCOUNTER — Other Ambulatory Visit: Payer: Self-pay

## 2018-12-14 ENCOUNTER — Encounter: Payer: Self-pay | Admitting: Family Medicine

## 2018-12-14 VITALS — BP 140/76 | HR 78 | Temp 98.7°F | Wt 241.0 lb

## 2018-12-14 DIAGNOSIS — N390 Urinary tract infection, site not specified: Secondary | ICD-10-CM | POA: Diagnosis not present

## 2018-12-14 DIAGNOSIS — E119 Type 2 diabetes mellitus without complications: Secondary | ICD-10-CM | POA: Diagnosis not present

## 2018-12-14 NOTE — Progress Notes (Signed)
Emily Stokes  MRN: [id number] DOB: Oct 10, 1960  Subjective:  HPI   The patient is a 58 year old female who presents for follow up  Of her diabetes and to re-check a urine culture. She has not been able to test her glucose as she has been having difficulty getting the correct lancets.  She will have the pharmacy send a request specifying which one she needs.   She is also here as she was told to do a repeat urine culture after her last treatment of UTI.  She states the only symptoms she is having currently is frequency.  Patient Active Problem List   Diagnosis Date Noted  . Lumbar facet arthropathy 03/18/2018  . Lumbar spondylosis 03/18/2018  . Encounter for screening colonoscopy   . Internal hemorrhoids   . Benign neoplasm of descending colon   . Benign neoplasm of cecum   . Type 2 diabetes mellitus without complication, without long-term current use of insulin ([place]) 07/31/2017  . Recurrent abdominal hernia without obstruction or gangrene 07/24/2017  . Cellulitis of face 05/30/2016  . Chronic pain syndrome 05/30/2016  . Nasal septal abscess 05/30/2016  . Pain in elbow 02/05/2016  . Lumbar degenerative disc disease 09/14/2015  . Facet syndrome, lumbar 09/14/2015  . Lumbar radiculopathy 09/14/2015  . Sacroiliac joint dysfunction 09/14/2015  . History of chicken pox 02/02/2015  . Leg pain 02/02/2015  . Hypercholesteremia 02/02/2015  . Blood glucose elevated 02/02/2015  . Pain in soft tissues of limb 02/02/2015  . Nonspecific ST-T changes 02/02/2015  . Awareness of heartbeats 02/02/2015  . Pain in extremity at multiple sites 02/02/2015  . Snores 02/02/2015  . Hernia of anterior abdominal wall 02/24/2012  . Pain in shoulder 12/05/2011  . Adnexal mass 10/30/2011  . Pelvic cyst 10/30/2011  . Palpitations 03/14/2011  . HTN (hypertension) 03/14/2011  . Abnormal finding on EKG 03/14/2011  . Obesity 03/14/2011  . Infected surgical wound 09/15/2008  . Urinary system disease  08/10/2008  . Benign essential HTN 07/12/2008  . Arthralgia of lower leg 03/28/2008  . Anemia, iron deficiency 06/17/2007  . Calculus of kidney 05/26/2007    Past Medical History:  Diagnosis Date  . Abdominal pain, epigastric   . Anemia   . Backache   . Bronchitis   . Calculus, kidney   . Carpal tunnel syndrome   . Chest pain   . Chicken pox   . Circulatory disease   . Disorder of kidney and ureter   . Excess, menstruation   . Hypercholesteremia   . Hypertension, essential, benign   . Infected postoperative seroma   . Iron deficiency   . Malaise and fatigue   . Measles   . MRSA (methicillin resistant staph aureus) culture positive   . Nausea alone   . Nonspecific abnormal electrocardiogram (ECG) (EKG)   . Pain in joint, lower leg     Social History   Socioeconomic History  . Marital status: Widowed    Spouse name: Not on file  . Number of children: 4  . Years of education: Not on file  . Highest education level: Bachelor's degree (e.g., BA, AB, BS)  Occupational History  . Occupation: Control and instrumentation engineer    Comment: disability  Social Needs  . Financial resource strain: Somewhat hard  . Food insecurity    Worry: Never true    Inability: Never true  . Transportation needs    Medical: No    Non-medical: No  Tobacco Use  . Smoking status:  Never Smoker  . Smokeless tobacco: Never Used  Substance and Sexual Activity  . Alcohol use: No    Alcohol/week: 0.0 standard drinks  . Drug use: No  . Sexual activity: Never  Lifestyle  . Physical activity    Days per week: 0 days    Minutes per session: 0 min  . Stress: Not at all  Relationships  . Social Herbalist on phone: Patient refused    Gets together: Patient refused    Attends religious service: Patient refused    Active member of club or organization: Patient refused    Attends meetings of clubs or organizations: Patient refused    Relationship status: Patient refused  . Intimate partner  violence    Fear of current or ex partner: Patient refused    Emotionally abused: Patient refused    Physically abused: Patient refused    Forced sexual activity: Patient refused  Other Topics Concern  . Not on file  Social History Narrative  . Not on file    Outpatient Encounter Medications as of 12/14/2018  Medication Sig Note  . amLODipine (NORVASC) 10 MG tablet TAKE 1 TABLET BY MOUTH  DAILY   . aspirin 81 MG tablet Take 81 mg by mouth daily.   Marland Kitchen atorvastatin (LIPITOR) 20 MG tablet TAKE 1 TABLET BY MOUTH  DAILY   . cyclobenzaprine (FLEXERIL) 10 MG tablet Take 10 mg by mouth 3 (three) times daily as needed for muscle spasms. 75/05/6999: Pt not certain of dosage  . HYDROcodone-acetaminophen (NORCO) 7.5-325 MG tablet Take 1 tablet by mouth every 8 (eight) hours as needed for moderate pain. For chronic pain.  To fill on or after 03/18/2018, 04/16/2018   . Lancets (ONETOUCH ULTRASOFT) lancets TEST BLOOD SUGAR BEFORE BREAKFAST DAILY. RETEST BLOOD SUGAR IF HAVING SYMPTOMS OF HYPOGLYCEMIA.   . metFORMIN (GLUCOPHAGE) 500 MG tablet TAKE 1 TABLET BY MOUTH  TWICE DAILY WITH MEALS   . metoprolol tartrate (LOPRESSOR) 25 MG tablet TAKE 1 TABLET BY MOUTH TWO  TIMES DAILY   . ondansetron (ZOFRAN ODT) 8 MG disintegrating tablet Take 1 tablet (8 mg total) by mouth every 8 (eight) hours as needed for nausea or vomiting.   Glory Rosebush VERIO test strip TEST BLOOD GLUCOSE BEFORE  BREAKFAST DAILY . RECHECK  IF ANY SYMPTOMS OF  HYPOGLYCEMIA   . phenazopyridine (PYRIDIUM) 200 MG tablet TAKE 1 TABLET BY MOUTH THREE TIMES A DAY AS NEEDED FOR PAIN   . pregabalin (LYRICA) 50 MG capsule Take 1 capsule (50 mg total) by mouth 3 (three) times daily.   . [DISCONTINUED] ciprofloxacin (CIPRO) 500 MG tablet Take 1 tablet (500 mg total) by mouth 2 (two) times daily.    No facility-administered encounter medications on file as of 12/14/2018.     No Known Allergies  Review of Systems  Constitutional: Negative for fever.  HENT:  Negative for ear pain, sinus pain and sore throat.   Respiratory: Negative for cough, shortness of breath and wheezing.   Cardiovascular: Negative for chest pain.  Gastrointestinal: Negative for abdominal pain, diarrhea and nausea.  Genitourinary: Positive for urgency.  Neurological: Negative for sensory change.    Objective:  BP 140/76 (BP Location: Right Arm, Patient Position: Sitting, Cuff Size: Normal)   Pulse 78   Temp 98.7 F (37.1 C) (Oral)   Wt 241 lb (109.3 kg)   SpO2 97%   BMI 35.59 kg/m   Physical Exam  Constitutional: She is oriented to person, place, and time  and well-developed, well-nourished, and in no distress.  HENT:  Head: Normocephalic.  Eyes: Conjunctivae are normal.  Neck: Neck supple.  Cardiovascular: Normal rate and regular rhythm.  Pulmonary/Chest: Effort normal and breath sounds normal.  Abdominal: Soft. Bowel sounds are normal.  Neurological: She is alert and oriented to person, place, and time.  Limping gait favoring the left leg with history of lumbar spondylosis and facet arthropathy.  Skin: No rash noted.  Psychiatric: Mood, affect and judgment normal.    Assessment and Plan :   1. Urinary tract infection without hematuria, site unspecified Treated UTI with Cipro and Pyridium on 11-06-18. Urine culture isolated Pseudomonas sensitive to all antibiotics tested. Still having urinary frequency (could be persistent UTI or worsening of DM control). Will get repeat urine culture to test for cure of recent Pseudomonas UTI. Increase fluid intake and may use AZO-Standard if dysuria symptoms return. - Urine Culture  2. Type 2 diabetes mellitus without complication, without long-term current use of insulin ([facility]) FBS 289 in the office today. Some frequent urination but no dysuria or hematuria. Last Hgb A1C was 11.8 on 11-06-18. Has not been checking glucose at home because lancets don't fit her auto-lancing apparatus. She will increase Metformin to 500 mg 2  tablets each morning and 1 each evening. Should work on better control of diet and use water aerobics or upper body exercises to help control DM. Recheck labs in 6-8 weeks.  Recent Results (from the past 2160 hour(s))  POCT urinalysis dipstick     Status: Abnormal   Collection Time: 11/06/18 10:39 AM  Result Value Ref Range   Color, UA Yellow    Clarity, UA Cloudy    Glucose, UA Positive (A) Negative    Comment: 1000   Bilirubin, UA Neg    Ketones, UA Neg    Spec Grav, UA 1.010 1.010 - 1.025   Blood, UA Trace Non-Hemolyzed    pH, UA 6.0 5.0 - 8.0   Protein, UA Positive (A) Negative    Comment: Trace   Urobilinogen, UA 0.2 0.2 or 1.0 E.U./dL   Nitrite, UA Neg    Leukocytes, UA Moderate (2+) (A) Negative   Appearance Abnormal    Odor Normal   CULTURE, URINE COMPREHENSIVE     Status: Abnormal   Collection Time: 11/06/18 10:49 AM   Specimen: Urine   URINE  Result Value Ref Range   Urine Culture, Comprehensive Final report (A)    Organism ID, Bacteria Comment (A)     Comment: Pseudomonas aeruginosa 50,000-100,000 colony forming units per mL    ANTIMICROBIAL SUSCEPTIBILITY Comment     Comment:       ** S = Susceptible; I = Intermediate; R = Resistant **                    P = Positive; N = Negative             MICS are expressed in micrograms per mL    Antibiotic                 RSLT#1    RSLT#2    RSLT#3    RSLT#4 Amikacin                       S Cefepime                       S Ceftazidime  S Ciprofloxacin                  S Gentamicin                     S Imipenem                       S Levofloxacin                   S Meropenem                      S Piperacillin                   S Ticarcillin                    S Tobramycin                     S   CBC with Differential/Platelet     Status: Abnormal   Collection Time: 11/06/18 11:10 AM  Result Value Ref Range   WBC 5.7 3.4 - 10.8 x10E3/uL   RBC 3.65 (L) 3.77 - 5.28 x10E6/uL   Hemoglobin 11.6  11.1 - 15.9 g/dL   Hematocrit 34.3 34.0 - 46.6 %   MCV 94 79 - 97 fL   MCH 31.8 26.6 - 33.0 pg   MCHC 33.8 31.5 - 35.7 g/dL   RDW 12.8 11.7 - 15.4 %   Platelets 358 150 - 450 x10E3/uL   Neutrophils 53 Not Estab. %   Lymphs 38 Not Estab. %   Monocytes 8 Not Estab. %   Eos 1 Not Estab. %   Basos 0 Not Estab. %   Neutrophils Absolute 3.0 1.4 - 7.0 x10E3/uL   Lymphocytes Absolute 2.2 0.7 - 3.1 x10E3/uL   Monocytes Absolute 0.4 0.1 - 0.9 x10E3/uL   EOS (ABSOLUTE) 0.1 0.0 - 0.4 x10E3/uL   Basophils Absolute 0.0 0.0 - 0.2 x10E3/uL   Immature Granulocytes 0 Not Estab. %   Immature Grans (Abs) 0.0 0.0 - 0.1 x10E3/uL  Comprehensive metabolic panel     Status: Abnormal   Collection Time: 11/06/18 11:10 AM  Result Value Ref Range   Glucose 388 (H) 65 - 99 mg/dL   BUN 19 6 - 24 mg/dL   Creatinine, Ser 1.21 (H) 0.57 - 1.00 mg/dL   GFR calc non Af Amer 49 (L) >59 mL/min/1.73   GFR calc Af Amer 57 (L) >59 mL/min/1.73   BUN/Creatinine Ratio 16 9 - 23   Sodium 133 (L) 134 - 144 mmol/L   Potassium 4.2 3.5 - 5.2 mmol/L   Chloride 99 96 - 106 mmol/L   CO2 19 (L) 20 - 29 mmol/L   Calcium 9.6 8.7 - 10.2 mg/dL   Total Protein 7.7 6.0 - 8.5 g/dL   Albumin 4.2 3.8 - 4.9 g/dL   Globulin, Total 3.5 1.5 - 4.5 g/dL   Albumin/Globulin Ratio 1.2 1.2 - 2.2   Bilirubin Total 0.3 0.0 - 1.2 mg/dL   Alkaline Phosphatase 139 (H) 39 - 117 IU/L   AST 9 0 - 40 IU/L   ALT 13 0 - 32 IU/L  Hemoglobin A1c     Status: Abnormal   Collection Time: 11/06/18 11:10 AM  Result Value Ref Range   Hgb A1c MFr Bld 11.8 (H) 4.8 - 5.6 %    Comment:  Prediabetes: 5.7 - 6.4          Diabetes: >6.4          Glycemic control for adults with diabetes: <7.0    Est. average glucose Bld gHb Est-mCnc 292 mg/dL  Lipid panel     Status: Abnormal   Collection Time: 11/06/18 11:10 AM  Result Value Ref Range   Cholesterol, Total 140 100 - 199 mg/dL   Triglycerides 140 0 - 149 mg/dL   HDL 32 (L) >39 mg/dL   VLDL Cholesterol  Cal 28 5 - 40 mg/dL   LDL Calculated 80 0 - 99 mg/dL   Chol/HDL Ratio 4.4 0.0 - 4.4 ratio    Comment:                                   T. Chol/HDL Ratio                                             Men  Women                               1/2 Avg.Risk  3.4    3.3                                   Avg.Risk  5.0    4.4                                2X Avg.Risk  9.6    7.1                                3X Avg.Risk 23.4   11.0   TSH     Status: None   Collection Time: 11/06/18 11:10 AM  Result Value Ref Range   TSH 1.600 0.450 - 4.500 uIU/mL

## 2018-12-15 ENCOUNTER — Ambulatory Visit: Payer: Self-pay | Admitting: Family Medicine

## 2018-12-16 LAB — URINE CULTURE

## 2018-12-17 ENCOUNTER — Other Ambulatory Visit: Payer: Self-pay | Admitting: Family Medicine

## 2018-12-17 DIAGNOSIS — N390 Urinary tract infection, site not specified: Secondary | ICD-10-CM

## 2018-12-17 MED ORDER — CIPROFLOXACIN HCL 500 MG PO TABS: 500.0000 mg | ORAL_TABLET | Freq: Two times a day (BID) | ORAL | 0 refills | Status: DC

## 2018-12-17 NOTE — Progress Notes (Signed)
Urine culture showed 25,000-50,000 mixed bacterial flora growth with recent history of Pseudomonas infections sensitive to this antibiotic. Will retreat for 5 more days and recheck urinalysis in a week.

## 2019-02-03 ENCOUNTER — Telehealth: Payer: Self-pay | Admitting: Family Medicine

## 2019-02-03 NOTE — Telephone Encounter (Signed)
Pt thinks she's having another UTI. She also has been exposed to her mother who tested positive with COVID today.  Please call pt back to advise.  Thanks, [employer]

## 2019-02-04 ENCOUNTER — Other Ambulatory Visit: Payer: Self-pay | Admitting: Family Medicine

## 2019-02-04 ENCOUNTER — Other Ambulatory Visit: Payer: Self-pay

## 2019-02-04 DIAGNOSIS — Z20822 Contact with and (suspected) exposure to covid-19: Secondary | ICD-10-CM

## 2019-02-04 DIAGNOSIS — Z20828 Contact with and (suspected) exposure to other viral communicable diseases: Secondary | ICD-10-CM

## 2019-02-04 DIAGNOSIS — N390 Urinary tract infection, site not specified: Secondary | ICD-10-CM

## 2019-02-04 DIAGNOSIS — I10 Essential (primary) hypertension: Secondary | ICD-10-CM

## 2019-02-04 DIAGNOSIS — R6889 Other general symptoms and signs: Secondary | ICD-10-CM | POA: Diagnosis not present

## 2019-02-04 MED ORDER — SULFAMETHOXAZOLE-TRIMETHOPRIM 800-160 MG PO TABS: 1.0000 | ORAL_TABLET | Freq: Two times a day (BID) | ORAL | 0 refills | Status: DC

## 2019-02-04 NOTE — Telephone Encounter (Signed)
Recommend she get COVID test and follow pandemic isolation restrictions at home. Can send in Bactrim-DS BID #20 to her pharmacy and increase water intake. Due for recheck of diabetes which could be causing increase in urinary frequency if sugar is elevated. Plan diabetes follow up in 2 weeks if COVID test negative.

## 2019-02-04 NOTE — Telephone Encounter (Signed)
Advised, Covid ordered and Rx sent to pharmacy.

## 2019-02-04 NOTE — Telephone Encounter (Signed)
LMTCB ED 

## 2019-02-04 NOTE — Telephone Encounter (Signed)
Pt returned call ° °teri °

## 2019-02-05 ENCOUNTER — Telehealth: Payer: Self-pay

## 2019-02-05 LAB — NOVEL CORONAVIRUS, NAA: SARS-CoV-2, NAA: DETECTED — AB

## 2019-02-05 NOTE — Telephone Encounter (Signed)
Patient called stating that she tested positive for Covid. She would like to know what she needs to do. Test results came back today.

## 2019-02-05 NOTE — Telephone Encounter (Signed)
Please review. Thanks!  

## 2019-02-05 NOTE — Telephone Encounter (Signed)
-----   Message from Margo Common, [place] sent at 02/05/2019  1:47 PM EDT ----- COVID test is positive. Must continue isolation at home for 14 days and finish antibiotic for UTI treatment. Postpone follow up of diabetes for 4 weeks. Should schedule virtual visit in a week if any symptoms remaining or worsening of symptoms. If fever increases or has difficulty with breathing, may need to go to the ER.

## 2019-02-05 NOTE — Telephone Encounter (Signed)
Left message to call back  

## 2019-02-05 NOTE — Telephone Encounter (Signed)
Provided Pt. With covid results . Patient voiced understanding.  Provided care advice also.  Voiced understanding.

## 2019-02-08 ENCOUNTER — Telehealth: Payer: Self-pay | Admitting: *Deleted

## 2019-02-08 ENCOUNTER — Other Ambulatory Visit: Payer: Self-pay | Admitting: Family Medicine

## 2019-02-08 DIAGNOSIS — U071 COVID-19: Secondary | ICD-10-CM

## 2019-02-08 MED ORDER — PSEUDOEPH-BROMPHEN-DM 30-2-10 MG/5ML PO SYRP: 5.0000 mL | ORAL_SOLUTION | Freq: Four times a day (QID) | ORAL | 0 refills | Status: DC | PRN

## 2019-02-08 MED ORDER — PREDNISONE 10 MG (21) PO TBPK: ORAL_TABLET | ORAL | 0 refills | Status: DC

## 2019-02-08 NOTE — Telephone Encounter (Signed)
She still would like the Prednisone taper and Claritin D

## 2019-02-08 NOTE — Telephone Encounter (Signed)
Please review

## 2019-02-08 NOTE — Telephone Encounter (Signed)
Patient tested positive covid-19 02/04/2019. Patient called concerning symptoms she is having body aches, headache and lightheadedness. Patient wanted to know if there is a medication she can take to help with symptoms. Please advise?

## 2019-02-08 NOTE — Telephone Encounter (Signed)
Sent medication to the [facility].

## 2019-02-08 NOTE — Telephone Encounter (Signed)
Adding a Claritin-D can help with any congestion and headache. May have to add a prednisone taper for the inflammation in the respiratory tract. Just have to realize any steroid -like the prednisone- can cause blood sugars to go off track and allow diabetes to get out of control.

## 2019-02-08 NOTE — Telephone Encounter (Signed)
Patient notified

## 2019-04-12 ENCOUNTER — Other Ambulatory Visit: Payer: Self-pay

## 2019-04-12 ENCOUNTER — Ambulatory Visit (INDEPENDENT_AMBULATORY_CARE_PROVIDER_SITE_OTHER): Payer: Medicare Other | Admitting: Family Medicine

## 2019-04-12 DIAGNOSIS — Z8619 Personal history of other infectious and parasitic diseases: Secondary | ICD-10-CM

## 2019-04-12 DIAGNOSIS — N390 Urinary tract infection, site not specified: Secondary | ICD-10-CM

## 2019-04-12 DIAGNOSIS — Z8616 Personal history of COVID-19: Secondary | ICD-10-CM

## 2019-04-12 MED ORDER — SULFAMETHOXAZOLE-TRIMETHOPRIM 800-160 MG PO TABS: 1.0000 | ORAL_TABLET | Freq: Two times a day (BID) | ORAL | 0 refills | Status: DC

## 2019-04-12 NOTE — Progress Notes (Signed)
Emily Stokes  MRN: [id number] DOB: 20-Aug-1960  Subjective:  HPI   The patient is a 58 year old female who presents via phone visit for follow up of her UTI.  Her last urine culture was done in August and it had mixed urogenital flora.    Virtual Visit via Telephone Note  I connected with Emily Stokes on 04/12/19 at 11:20 AM EST by telephone and verified that I am speaking with the correct person using two identifiers.  Location: Patient: Home Provider: Office   I discussed the limitations, risks, security and privacy concerns of performing an evaluation and management service by telephone and the availability of in person appointments. I also discussed with the patient that there may be a patient responsible charge related to this service. The patient expressed understanding and agreed to proceed.   Patient Active Problem List   Diagnosis Date Noted  . Lumbar facet arthropathy 03/18/2018  . Lumbar spondylosis 03/18/2018  . Encounter for screening colonoscopy   . Internal hemorrhoids   . Benign neoplasm of descending colon   . Benign neoplasm of cecum   . Type 2 diabetes mellitus without complication, without long-term current use of insulin ([place]) 07/31/2017  . Recurrent abdominal hernia without obstruction or gangrene 07/24/2017  . Cellulitis of face 05/30/2016  . Chronic pain syndrome 05/30/2016  . Nasal septal abscess 05/30/2016  . Pain in elbow 02/05/2016  . Lumbar degenerative disc disease 09/14/2015  . Facet syndrome, lumbar 09/14/2015  . Lumbar radiculopathy 09/14/2015  . Sacroiliac joint dysfunction 09/14/2015  . History of chicken pox 02/02/2015  . Leg pain 02/02/2015  . Hypercholesteremia 02/02/2015  . Blood glucose elevated 02/02/2015  . Pain in soft tissues of limb 02/02/2015  . Nonspecific ST-T changes 02/02/2015  . Awareness of heartbeats 02/02/2015  . Pain in extremity at multiple sites 02/02/2015  . Snores 02/02/2015  . Hernia of anterior abdominal  wall 02/24/2012  . Pain in shoulder 12/05/2011  . Adnexal mass 10/30/2011  . Pelvic cyst 10/30/2011  . Palpitations 03/14/2011  . HTN (hypertension) 03/14/2011  . Abnormal finding on EKG 03/14/2011  . Obesity 03/14/2011  . Infected surgical wound 09/15/2008  . Urinary system disease 08/10/2008  . Benign essential HTN 07/12/2008  . Arthralgia of lower leg 03/28/2008  . Anemia, iron deficiency 06/17/2007  . Calculus of kidney 05/26/2007    Past Medical History:  Diagnosis Date  . Abdominal pain, epigastric   . Anemia   . Backache   . Bronchitis   . Calculus, kidney   . Carpal tunnel syndrome   . Chest pain   . Chicken pox   . Circulatory disease   . Disorder of kidney and ureter   . Excess, menstruation   . Hypercholesteremia   . Hypertension, essential, benign   . Infected postoperative seroma   . Iron deficiency   . Malaise and fatigue   . Measles   . MRSA (methicillin resistant staph aureus) culture positive   . Nausea alone   . Nonspecific abnormal electrocardiogram (ECG) (EKG)   . Pain in joint, lower leg     Social History   Socioeconomic History  . Marital status: Widowed    Spouse name: Not on file  . Number of children: 4  . Years of education: Not on file  . Highest education level: Bachelor's degree (e.g., BA, AB, BS)  Occupational History  . Occupation: Control and instrumentation engineer    Comment: disability  Social Needs  . Emergency planning/management officer  strain: Somewhat hard  . Food insecurity    Worry: Never true    Inability: Never true  . Transportation needs    Medical: No    Non-medical: No  Tobacco Use  . Smoking status: Never Smoker  . Smokeless tobacco: Never Used  Substance and Sexual Activity  . Alcohol use: No    Alcohol/week: 0.0 standard drinks  . Drug use: No  . Sexual activity: Never  Lifestyle  . Physical activity    Days per week: 0 days    Minutes per session: 0 min  . Stress: Not at all  Relationships  . Social Herbalist on  phone: Patient refused    Gets together: Patient refused    Attends religious service: Patient refused    Active member of club or organization: Patient refused    Attends meetings of clubs or organizations: Patient refused    Relationship status: Patient refused  . Intimate partner violence    Fear of current or ex partner: Patient refused    Emotionally abused: Patient refused    Physically abused: Patient refused    Forced sexual activity: Patient refused  Other Topics Concern  . Not on file  Social History Narrative  . Not on file    Outpatient Encounter Medications as of 04/12/2019  Medication Sig Note  . amLODipine (NORVASC) 10 MG tablet TAKE 1 TABLET BY MOUTH  DAILY   . aspirin 81 MG tablet Take 81 mg by mouth daily.   Marland Kitchen atorvastatin (LIPITOR) 20 MG tablet TAKE 1 TABLET BY MOUTH  DAILY   . brompheniramine-pseudoephedrine-DM 30-2-10 MG/5ML syrup Take 5 mLs by mouth 4 (four) times daily as needed.   . cyclobenzaprine (FLEXERIL) 10 MG tablet Take 10 mg by mouth 3 (three) times daily as needed for muscle spasms. 123XX123: Pt not certain of dosage  . HYDROcodone-acetaminophen (NORCO) 7.5-325 MG tablet Take 1 tablet by mouth every 8 (eight) hours as needed for moderate pain. For chronic pain.  To fill on or after 03/18/2018, 04/16/2018   . Lancets (ONETOUCH ULTRASOFT) lancets TEST BLOOD SUGAR BEFORE BREAKFAST DAILY. RETEST BLOOD SUGAR IF HAVING SYMPTOMS OF HYPOGLYCEMIA.   . metFORMIN (GLUCOPHAGE) 500 MG tablet TAKE 1 TABLET BY MOUTH  TWICE DAILY WITH MEALS   . metoprolol tartrate (LOPRESSOR) 25 MG tablet TAKE 1 TABLET BY MOUTH TWO  TIMES DAILY   . ondansetron (ZOFRAN ODT) 8 MG disintegrating tablet Take 1 tablet (8 mg total) by mouth every 8 (eight) hours as needed for nausea or vomiting.   Glory Rosebush VERIO test strip TEST BLOOD GLUCOSE BEFORE  BREAKFAST DAILY . RECHECK  IF ANY SYMPTOMS OF  HYPOGLYCEMIA   . phenazopyridine (PYRIDIUM) 200 MG tablet TAKE 1 TABLET BY MOUTH THREE TIMES A  DAY AS NEEDED FOR PAIN   . predniSONE (STERAPRED UNI-PAK 21 TAB) 10 MG (21) TBPK tablet Taper as directed on package starting at 6 tablets by mouth day 1, 5 day 2, 4 day 3, 3 day 4, 2 day 5 and 1 day 6.   . pregabalin (LYRICA) 50 MG capsule Take 1 capsule (50 mg total) by mouth 3 (three) times daily.   Marland Kitchen sulfamethoxazole-trimethoprim (BACTRIM DS) 800-160 MG tablet Take 1 tablet by mouth 2 (two) times daily.    No facility-administered encounter medications on file as of 04/12/2019.     No Known Allergies  Review of Systems  Constitutional: Negative for chills, diaphoresis, fever and malaise/fatigue.  HENT: Positive for congestion. Negative for  ear pain, sinus pain and sore throat.   Respiratory: Positive for cough. Negative for shortness of breath.   Cardiovascular: Negative for chest pain.  Gastrointestinal: Negative for abdominal pain and diarrhea.  Genitourinary:       Nocturiat wice a night and  Some urgency.  Musculoskeletal: Negative for myalgias.  Neurological: Negative for headaches.    Objective:  There were no vitals taken for this visit.  Physical Exam: No apparent respiratory distress during telephonic interview.  Assessment and Plan :  1. Urinary tract infection without hematuria, site unspecified Having recurrence of urgency and nocturia similar to past UTI. No hematuria and temperature 98 today. Requests Bactrim used in the past and will bring in specimen for culture. - sulfamethoxazole-trimethoprim (BACTRIM DS) 800-160 MG tablet; Take 1 tablet by mouth 2 (two) times daily.  Dispense: 20 tablet; Refill: 0  2. History of COVID-19 No dyspnea or fever. Had a positive COVID test on 02-05-19 and stayed home in her bedroom for 4 weeks. Only slight dry cough remains now. May use Mucinex-DM or the Bromfed-DM left over used the end of September. Recheck as needed.     I discussed the assessment and treatment plan with the patient. The patient was provided an opportunity to ask  questions and all were answered. The patient agreed with the plan and demonstrated an understanding of the instructions.   The patient was advised to call back or seek an in-person evaluation if the symptoms worsen or if the condition fails to improve as anticipated.  I provided 15 minutes of non-face-to-face time during this encounter.

## 2019-04-14 ENCOUNTER — Other Ambulatory Visit: Payer: Self-pay

## 2019-04-14 ENCOUNTER — Telehealth: Payer: Self-pay | Admitting: Family Medicine

## 2019-04-14 DIAGNOSIS — N390 Urinary tract infection, site not specified: Secondary | ICD-10-CM | POA: Diagnosis not present

## 2019-04-14 DIAGNOSIS — I10 Essential (primary) hypertension: Secondary | ICD-10-CM

## 2019-04-14 DIAGNOSIS — E7800 Pure hypercholesterolemia, unspecified: Secondary | ICD-10-CM

## 2019-04-14 DIAGNOSIS — E119 Type 2 diabetes mellitus without complications: Secondary | ICD-10-CM

## 2019-04-14 NOTE — Telephone Encounter (Signed)
Please advise if you want labs done

## 2019-04-14 NOTE — Telephone Encounter (Signed)
Patient came in the office stating that you told her that you wanted her to have blood work done and to come in any morning to have it done.  There was no lab order in the computer.  I asked a nurse and they put in a order for a urine culture but the patient still thinks that you may have wanted her to have blood work done.  She states that if you do want her to have blood work done that she will come back for that.  Let patient know what you decide.

## 2019-04-15 ENCOUNTER — Other Ambulatory Visit: Payer: Self-pay | Admitting: Family Medicine

## 2019-04-15 DIAGNOSIS — I10 Essential (primary) hypertension: Secondary | ICD-10-CM

## 2019-04-15 DIAGNOSIS — E7800 Pure hypercholesterolemia, unspecified: Secondary | ICD-10-CM

## 2019-04-15 DIAGNOSIS — E119 Type 2 diabetes mellitus without complications: Secondary | ICD-10-CM

## 2019-04-15 NOTE — Telephone Encounter (Signed)
Needs follow up of diabetes, hyperlipidemia and hypertension - Hgb A1C, CMP, Lipid Panel and CBC. Put future orders in chart to be released when she comes back for blood draw.

## 2019-04-15 NOTE — Telephone Encounter (Signed)
Lab orders placed in chart. Tried calling patient to advise her. Left message to call back.

## 2019-04-16 LAB — URINE CULTURE

## 2019-04-16 NOTE — Telephone Encounter (Signed)
-----   Message from Margo Common, [place] sent at 04/16/2019  3:13 PM EST ----- Doristine Devoid amount of Lactobacillus bacteria isolated on the urine culture. Amoxicillin (a penicillin) may be needed if the Sulfamethoxazole is not getting rid of this infection. Recheck urinalysis if no better in 10 days.

## 2019-04-19 NOTE — Telephone Encounter (Signed)
Advised 

## 2019-04-20 ENCOUNTER — Other Ambulatory Visit: Payer: Self-pay

## 2019-04-20 ENCOUNTER — Other Ambulatory Visit: Payer: Self-pay | Admitting: Family Medicine

## 2019-04-20 DIAGNOSIS — E119 Type 2 diabetes mellitus without complications: Secondary | ICD-10-CM | POA: Diagnosis not present

## 2019-04-20 DIAGNOSIS — I10 Essential (primary) hypertension: Secondary | ICD-10-CM

## 2019-04-20 DIAGNOSIS — E7800 Pure hypercholesterolemia, unspecified: Secondary | ICD-10-CM

## 2019-04-21 LAB — CBC WITH DIFFERENTIAL/PLATELET
Basophils Absolute: 0 10*3/uL (ref 0.0–0.2)
Basos: 1 %
EOS (ABSOLUTE): 0.2 10*3/uL (ref 0.0–0.4)
Eos: 4 %
Hematocrit: 35.6 % (ref 34.0–46.6)
Hemoglobin: 11.1 g/dL (ref 11.1–15.9)
Immature Grans (Abs): 0 10*3/uL (ref 0.0–0.1)
Immature Granulocytes: 0 %
Lymphocytes Absolute: 2.4 10*3/uL (ref 0.7–3.1)
Lymphs: 45 %
MCH: 30.7 pg (ref 26.6–33.0)
MCHC: 31.2 g/dL — ABNORMAL LOW (ref 31.5–35.7)
MCV: 98 fL — ABNORMAL HIGH (ref 79–97)
Monocytes Absolute: 0.5 10*3/uL (ref 0.1–0.9)
Monocytes: 8 %
Neutrophils Absolute: 2.2 10*3/uL (ref 1.4–7.0)
Neutrophils: 42 %
Platelets: 396 10*3/uL (ref 150–450)
RBC: 3.62 x10E6/uL — ABNORMAL LOW (ref 3.77–5.28)
RDW: 14 % (ref 11.7–15.4)
WBC: 5.3 10*3/uL (ref 3.4–10.8)

## 2019-04-21 LAB — COMPREHENSIVE METABOLIC PANEL
ALT: 15 IU/L (ref 0–32)
AST: 14 IU/L (ref 0–40)
Albumin/Globulin Ratio: 1.3 (ref 1.2–2.2)
Albumin: 4.1 g/dL (ref 3.8–4.9)
Alkaline Phosphatase: 95 IU/L (ref 39–117)
BUN/Creatinine Ratio: 16 (ref 9–23)
BUN: 18 mg/dL (ref 6–24)
Bilirubin Total: 0.2 mg/dL (ref 0.0–1.2)
CO2: 21 mmol/L (ref 20–29)
Calcium: 9.8 mg/dL (ref 8.7–10.2)
Chloride: 105 mmol/L (ref 96–106)
Creatinine, Ser: 1.16 mg/dL — ABNORMAL HIGH (ref 0.57–1.00)
GFR calc Af Amer: 60 mL/min/{1.73_m2} (ref >59)
GFR calc non Af Amer: 52 mL/min/{1.73_m2} — ABNORMAL LOW (ref >59)
Globulin, Total: 3.1 g/dL (ref 1.5–4.5)
Glucose: 148 mg/dL — ABNORMAL HIGH (ref 65–99)
Potassium: 4.3 mmol/L (ref 3.5–5.2)
Sodium: 141 mmol/L (ref 134–144)
Total Protein: 7.2 g/dL (ref 6.0–8.5)

## 2019-04-21 LAB — LIPID PANEL
Chol/HDL Ratio: 5.3 ratio — ABNORMAL HIGH (ref 0.0–4.4)
Cholesterol, Total: 208 mg/dL — ABNORMAL HIGH (ref 100–199)
HDL: 39 mg/dL — ABNORMAL LOW (ref >39)
LDL Chol Calc (NIH): 141 mg/dL — ABNORMAL HIGH (ref 0–99)
Triglycerides: 155 mg/dL — ABNORMAL HIGH (ref 0–149)
VLDL Cholesterol Cal: 28 mg/dL (ref 5–40)

## 2019-04-21 LAB — HEMOGLOBIN A1C
Est. average glucose Bld gHb Est-mCnc: 151 mg/dL
Hgb A1c MFr Bld: 6.9 % — ABNORMAL HIGH (ref 4.8–5.6)

## 2019-04-26 ENCOUNTER — Telehealth: Payer: Self-pay

## 2019-04-26 NOTE — Telephone Encounter (Signed)
Was under the impression the Atorvastatin 20 mg was what she was taking. Let us know what [facility] is sending.

## 2019-04-26 NOTE — Telephone Encounter (Signed)
-----   Message from Margo Common, [place] sent at 04/23/2019  9:13 AM EST ----- Blood sugar and Hgb A1C better than 5 months ago. Kidney function showing some strain and may need to consider adding a low dose of ACE or ARB to protect kidney function. Cholesterol levels higher and need Atorvastatin 20 mg qd #90 & 3RF. Recheck appointment and fasting labs in 3-4 months.

## 2019-04-26 NOTE — Telephone Encounter (Signed)
Patient advised as directed below. Per patient she has been out of her cholesterol medicine for the past month and reports that [facility] got in contact with her that her medicine is on the way. She is asking if you still want to call this prescription in for her? Scheduled patient for her 3 month f/u. Thanks. -Talya Quain R.

## 2019-04-27 NOTE — Telephone Encounter (Signed)
Patient is going to start back on her Atorvastatin and then check the labs again in 3 months

## 2019-05-18 ENCOUNTER — Ambulatory Visit: Payer: Medicare Other | Admitting: Family Medicine

## 2019-05-20 ENCOUNTER — Encounter: Payer: Medicare Other | Admitting: Family Medicine

## 2019-06-14 ENCOUNTER — Encounter: Payer: Medicare Other | Admitting: Family Medicine

## 2019-06-29 DIAGNOSIS — Z1231 Encounter for screening mammogram for malignant neoplasm of breast: Secondary | ICD-10-CM | POA: Diagnosis not present

## 2019-07-15 DIAGNOSIS — N6314 Unspecified lump in the right breast, lower inner quadrant: Secondary | ICD-10-CM | POA: Diagnosis not present

## 2019-07-15 DIAGNOSIS — N6312 Unspecified lump in the right breast, upper inner quadrant: Secondary | ICD-10-CM | POA: Diagnosis not present

## 2019-07-15 DIAGNOSIS — R928 Other abnormal and inconclusive findings on diagnostic imaging of breast: Secondary | ICD-10-CM | POA: Diagnosis not present

## 2019-07-15 DIAGNOSIS — N6315 Unspecified lump in the right breast, overlapping quadrants: Secondary | ICD-10-CM | POA: Diagnosis not present

## 2019-07-26 ENCOUNTER — Ambulatory Visit: Payer: Self-pay | Admitting: Family Medicine

## 2019-07-27 ENCOUNTER — Other Ambulatory Visit: Payer: Self-pay

## 2019-07-27 ENCOUNTER — Ambulatory Visit (INDEPENDENT_AMBULATORY_CARE_PROVIDER_SITE_OTHER): Payer: Medicare HMO | Admitting: Family Medicine

## 2019-07-27 ENCOUNTER — Encounter: Payer: Self-pay | Admitting: Family Medicine

## 2019-07-27 VITALS — BP 140/70 | HR 83 | Temp 96.9°F | Ht 69.0 in | Wt 237.0 lb

## 2019-07-27 DIAGNOSIS — E119 Type 2 diabetes mellitus without complications: Secondary | ICD-10-CM | POA: Diagnosis not present

## 2019-07-27 DIAGNOSIS — G894 Chronic pain syndrome: Secondary | ICD-10-CM | POA: Diagnosis not present

## 2019-07-27 DIAGNOSIS — I10 Essential (primary) hypertension: Secondary | ICD-10-CM

## 2019-07-27 DIAGNOSIS — E7800 Pure hypercholesterolemia, unspecified: Secondary | ICD-10-CM | POA: Diagnosis not present

## 2019-07-27 DIAGNOSIS — N390 Urinary tract infection, site not specified: Secondary | ICD-10-CM

## 2019-07-27 DIAGNOSIS — N6310 Unspecified lump in the right breast, unspecified quadrant: Secondary | ICD-10-CM

## 2019-07-27 DIAGNOSIS — Z1211 Encounter for screening for malignant neoplasm of colon: Secondary | ICD-10-CM | POA: Diagnosis not present

## 2019-07-27 MED ORDER — SULFAMETHOXAZOLE-TRIMETHOPRIM 800-160 MG PO TABS: 1.0000 | ORAL_TABLET | Freq: Two times a day (BID) | ORAL | 0 refills | Status: DC

## 2019-07-27 MED ORDER — METFORMIN HCL 500 MG PO TABS: 500.0000 mg | ORAL_TABLET | Freq: Two times a day (BID) | ORAL | 3 refills | Status: DC

## 2019-07-27 NOTE — Progress Notes (Signed)
Patient: Emily Stokes, Female    DOB: 1961-04-11, 59 y.o.   MRN: [id number] Visit Date: 07/27/2019  Today's Provider: [name], PA   Chief Complaint  Patient presents with  . Annual Exam   Subjective:  Emily Stokes is a 59 y.o. female who presents today for health maintenance and complete physical. She feels fairly well. She reports exercising none. She reports she is sleeping well.  01/09/18 Colonoscopy 07/15/19 Mammogram-has biopsy scheduled later this week Pap-pt is post hysterectomy   Review of Systems  Constitutional: Negative.   HENT: Negative.   Eyes: Positive for visual disturbance.  Respiratory: Negative.   Cardiovascular: Negative.   Gastrointestinal: Negative.   Endocrine: Negative.   Genitourinary: Positive for frequency.  Musculoskeletal: Positive for back pain and myalgias.  Skin: Negative.   Allergic/Immunologic: Negative.   Neurological: Negative.   Hematological: Negative.   Psychiatric/Behavioral: Negative.     Social History   Socioeconomic History  . Marital status: Widowed    Spouse name: Not on file  . Number of children: 4  . Years of education: Not on file  . Highest education level: Bachelor's degree (e.g., BA, AB, BS)  Occupational History  . Occupation: Control and instrumentation engineer    Comment: disability  Tobacco Use  . Smoking status: Never Smoker  . Smokeless tobacco: Never Used  Substance and Sexual Activity  . Alcohol use: No    Alcohol/week: 0.0 standard drinks  . Drug use: No  . Sexual activity: Never  Other Topics Concern  . Not on file  Social History Narrative  . Not on file   Social Determinants of Health   Financial Resource Strain:   . Difficulty of Paying Living Expenses:   Food Insecurity:   . Worried About Charity fundraiser in the Last Year:   . Arboriculturist in the Last Year:   Transportation Needs:   . Film/video editor (Medical):   Marland Kitchen Lack of Transportation (Non-Medical):   Physical Activity: Inactive   . Days of Exercise per Week: 0 days  . Minutes of Exercise per Session: 0 min  Stress: No Stress Concern Present  . Feeling of Stress : Not at all  Social Connections: Unknown  . Frequency of Communication with Friends and Family: Patient refused  . Frequency of Social Gatherings with Friends and Family: Patient refused  . Attends Religious Services: Patient refused  . Active Member of Clubs or Organizations: Patient refused  . Attends Archivist Meetings: Patient refused  . Marital Status: Patient refused  Intimate Partner Violence: Unknown  . Fear of Current or Ex-Partner: Patient refused  . Emotionally Abused: Patient refused  . Physically Abused: Patient refused  . Sexually Abused: Patient refused    Patient Active Problem List   Diagnosis Date Noted  . Lumbar facet arthropathy 03/18/2018  . Lumbar spondylosis 03/18/2018  . Encounter for screening colonoscopy   . Internal hemorrhoids   . Benign neoplasm of descending colon   . Benign neoplasm of cecum   . Type 2 diabetes mellitus without complication, without long-term current use of insulin ([place]) 07/31/2017  . Recurrent abdominal hernia without obstruction or gangrene 07/24/2017  . Cellulitis of face 05/30/2016  . Chronic pain syndrome 05/30/2016  . Nasal septal abscess 05/30/2016  . Pain in elbow 02/05/2016  . Lumbar degenerative disc disease 09/14/2015  . Facet syndrome, lumbar 09/14/2015  . Lumbar radiculopathy 09/14/2015  . Sacroiliac joint dysfunction 09/14/2015  . History of chicken pox 02/02/2015  .  Leg pain 02/02/2015  . Hypercholesteremia 02/02/2015  . Blood glucose elevated 02/02/2015  . Pain in soft tissues of limb 02/02/2015  . Nonspecific ST-T changes 02/02/2015  . Awareness of heartbeats 02/02/2015  . Pain in extremity at multiple sites 02/02/2015  . Snores 02/02/2015  . Hernia of anterior abdominal wall 02/24/2012  . Pain in shoulder 12/05/2011  . Adnexal mass 10/30/2011  . Pelvic cyst  10/30/2011  . Palpitations 03/14/2011  . HTN (hypertension) 03/14/2011  . Abnormal finding on EKG 03/14/2011  . Obesity 03/14/2011  . Infected surgical wound 09/15/2008  . Urinary system disease 08/10/2008  . Benign essential HTN 07/12/2008  . Arthralgia of lower leg 03/28/2008  . Anemia, iron deficiency 06/17/2007  . Calculus of kidney 05/26/2007    Past Surgical History:  Procedure Laterality Date  . ABDOMINAL HYSTERECTOMY    . CESAREAN SECTION    . COLONOSCOPY  1997  . COLONOSCOPY WITH PROPOFOL N/A 01/09/2018   Procedure: COLONOSCOPY WITH PROPOFOL;  Surgeon: Virgel Manifold, MD;  Location: [facility] ENDOSCOPY;  Service: Endoscopy;  Laterality: N/A;  . COLOSTOMY TAKEDOWN  2012  . Hartmann's procedure.    Marland Kitchen HERNIA REPAIR  0000000   umbilical  . LITHOTRIPSY  0000000   with complications, hospitalized due to these complications for 3 months  . mrsa     removal on nose  . PARTIAL HYSTERECTOMY  2009    vaginal hysterectomy, has both ovaries    Her family history includes Diabetes in her mother and other family members; Hypertension in her father, mother, and other family members; Stroke in her father.    No Known Allergies  Outpatient Encounter Medications as of 07/27/2019  Medication Sig Note  . amLODipine (NORVASC) 10 MG tablet TAKE 1 TABLET BY MOUTH  DAILY   . atorvastatin (LIPITOR) 20 MG tablet TAKE 1 TABLET BY MOUTH  DAILY   . brompheniramine-pseudoephedrine-DM 30-2-10 MG/5ML syrup Take 5 mLs by mouth 4 (four) times daily as needed.   . metFORMIN (GLUCOPHAGE) 500 MG tablet TAKE 1 TABLET BY MOUTH  TWICE DAILY WITH MEALS   . metoprolol tartrate (LOPRESSOR) 25 MG tablet TAKE 1 TABLET BY MOUTH TWO  TIMES DAILY   . aspirin 81 MG tablet Take 81 mg by mouth daily.   . cyclobenzaprine (FLEXERIL) 10 MG tablet Take 10 mg by mouth 3 (three) times daily as needed for muscle spasms. [id number]: Pt not certain of dosage  . HYDROcodone-acetaminophen (NORCO) 7.5-325 MG tablet Take 1 tablet by  mouth every 8 (eight) hours as needed for moderate pain. For chronic pain.  To fill on or after 03/18/2018, 04/16/2018 (Patient not taking: Reported on 07/27/2019)   . Lancets (ONETOUCH ULTRASOFT) lancets TEST BLOOD SUGAR BEFORE BREAKFAST DAILY. RETEST BLOOD SUGAR IF HAVING SYMPTOMS OF HYPOGLYCEMIA.   Marland Kitchen ondansetron (ZOFRAN ODT) 8 MG disintegrating tablet Take 1 tablet (8 mg total) by mouth every 8 (eight) hours as needed for nausea or vomiting. (Patient not taking: Reported on 07/27/2019)   . ONETOUCH VERIO test strip TEST BLOOD GLUCOSE BEFORE  BREAKFAST DAILY . RECHECK  IF ANY SYMPTOMS OF  HYPOGLYCEMIA   . phenazopyridine (PYRIDIUM) 200 MG tablet TAKE 1 TABLET BY MOUTH THREE TIMES A DAY AS NEEDED FOR PAIN (Patient not taking: Reported on 07/27/2019)   . predniSONE (STERAPRED UNI-PAK 21 TAB) 10 MG (21) TBPK tablet Taper as directed on package starting at 6 tablets by mouth day 1, 5 day 2, 4 day 3, 3 day 4, 2 day 5 and 1  day 6. (Patient not taking: Reported on 07/27/2019)   . pregabalin (LYRICA) 50 MG capsule Take 1 capsule (50 mg total) by mouth 3 (three) times daily. (Patient not taking: Reported on 07/27/2019)   . sulfamethoxazole-trimethoprim (BACTRIM DS) 800-160 MG tablet Take 1 tablet by mouth 2 (two) times daily. (Patient not taking: Reported on 07/27/2019)    No facility-administered encounter medications on file as of 07/27/2019.    Patient Care Team: Mckinlee Dunk, [name], PA as PCP - General (Physician Assistant)      Objective:   Vitals:  Vitals:   07/27/19 1351  BP: 140/70  Pulse: 83  Temp: (!) 96.9 F (36.1 C)  TempSrc: Skin  SpO2: 98%  Weight: 237 lb (107.5 kg)  Height: 5\' 9"  (1.753 m)  Body mass index is 35 kg/m. Wt Readings from Last 3 Encounters:  07/27/19 237 lb (107.5 kg)  12/14/18 241 lb (109.3 kg)  11/06/18 241 lb (109.3 kg)   Physical Exam Constitutional:      Appearance: Normal appearance. She is normal weight.  HENT:     Head: Normocephalic and atraumatic.      Right Ear: Tympanic membrane, ear canal and external ear normal.     Left Ear: Tympanic membrane, ear canal and external ear normal.     Nose: Nose normal.     Mouth/Throat:     Mouth: Mucous membranes are moist.     Pharynx: Oropharynx is clear.  Eyes:     Extraocular Movements: Extraocular movements intact.     Conjunctiva/sclera: Conjunctivae normal.     Pupils: Pupils are equal, round, and reactive to light.  Cardiovascular:     Rate and Rhythm: Normal rate and regular rhythm.     Pulses: Normal pulses.     Heart sounds: Normal heart sounds.  Pulmonary:     Effort: Pulmonary effort is normal.     Breath sounds: Normal breath sounds.  Abdominal:     General: Abdomen is flat. Bowel sounds are normal.     Palpations: Abdomen is soft.  Genitourinary:    Comments: Status post hysterectomy. Unable to tolerate much exam due to chronic pain from past nerve damage during TAH. Musculoskeletal:     Cervical back: Normal range of motion and neck supple.     Comments: Significant pain in the left thigh and groin area to actively elevate the left leg. Pain is anterior and some low back discomfort to palpation. Unable to elicit DTR's at knees.  Skin:    General: Skin is warm and dry.  Neurological:     General: No focal deficit present.     Mental Status: She is alert and oriented to person, place, and time. Mental status is at baseline.  Psychiatric:        Mood and Affect: Mood normal.        Behavior: Behavior normal.        Thought Content: Thought content normal.        Judgment: Judgment normal.    Diabetic Foot Form - Detailed   Diabetic Foot Exam - detailed Diabetic Foot exam was performed with the following findings: Yes 07/27/2019  2:58 PM  Visual Foot Exam completed.: Yes  Can the patient see the bottom of their feet?: Yes Are the shoes appropriate in style and fit?: Yes Is there swelling or and abnormal foot shape?: No Is there a claw toe deformity?: No Is there elevated  skin temparature?: No Is there foot or ankle muscle weakness?: No  Normal Range of Motion: Yes Pulse Foot Exam completed.: Yes  Right posterior Tibialias: Present Left posterior Tibialias: Present  Right Dorsalis Pedis: Present Left Dorsalis Pedis: Present  Semmes-Weinstein Monofilament Test R Site 1-Great Toe: Pos L Site 1-Great Toe: Pos        Depression Screen PHQ 2/9 Scores 11/04/2018 03/18/2018 02/12/2018 01/07/2018  PHQ - 2 Score 0 0 0 0  PHQ- 9 Score - - - -    Assessment & Plan:     Routine Health Maintenance and Physical Exam  Exercise Activities and Dietary recommendations Goals    . Exercise 2x per week (45 min per time)     Recommend increasing exercise weekly. Pt to start physical therapy 2 times a week for 45 minutes.     . Have 3 meals a day     Recommend to start eating 3 meals a day with 2 healthy snacks in between.       Immunization History  Administered Date(s) Administered  . Influenza Split 05/26/2007  . Influenza,inj,Quad PF,6+ Mos 05/31/2016, 05/19/2017, 01/30/2018  . Pneumococcal Polysaccharide-23 01/30/2018    Health Maintenance  Topic Date Due  . OPHTHALMOLOGY EXAM  Never done  . TETANUS/TDAP  Never done  . PAP SMEAR-Modifier  Never done  . MAMMOGRAM  Never done  . INFLUENZA VACCINE  12/12/2018  . COLONOSCOPY  01/10/2019  . FOOT EXAM  01/31/2019  . URINE MICROALBUMIN  01/31/2019  . HEMOGLOBIN A1C  10/19/2019  . PNEUMOCOCCAL POLYSACCHARIDE VACCINE AGE 65-64 HIGH RISK  Completed  . Hepatitis C Screening  Completed  . HIV Screening  Completed     Discussed health benefits of physical activity, and encouraged her to engage in regular exercise appropriate for her age and condition.    1. Type 2 diabetes mellitus without complication, without long-term current use of insulin ([facility]) FBS in the 180-250 range the past 2 weeks. Having some urinary frequency recently. Presently on Metformin 500 mg BID. May need additional medications or increase  in Metformin dosage to regain diabetes control. Recheck labs and follow up pending reports. Encouraged to get annual ophthalmology exam. Unremarkable foot exam today. Unable to get adequate urine specimen to check microalbumen today. - CBC with Differential/Platelet - Comprehensive metabolic panel - Hemoglobin A1c - Lipid Panel With LDL/HDL Ratio - metFORMIN (GLUCOPHAGE) 500 MG tablet; Take 1 tablet (500 mg total) by mouth 2 (two) times daily with a meal.  Dispense: 60 tablet; Refill: 3  2. Urinary tract infection without hematuria, site unspecified Has had frequent UTI's and feels some frequency with oliguria recently. Urinalysis difficult today due to insufficient quantity of urine specimen. Found 5-6 WBC's and few bacteria on microscopic exam without being able to centrifuge specimen. Will check CBC, CMP and start Bactrim DS. Increase fluid intake and recheck if symptoms persist in 7 days. Hope to get adequate specimen at that time to check urine C&S if needed. - CBC with Differential/Platelet - Comprehensive metabolic panel - sulfamethoxazole-trimethoprim (BACTRIM DS) 800-160 MG tablet; Take 1 tablet by mouth 2 (two) times daily.  Dispense: 20 tablet; Refill: 0  3. Hypercholesteremia Tolerating Atorvastatin 20 mg qd and trying to follow a low fat diet. Has much difficulty with exercise due to chronic pain in the left thigh/groin area. Continue present regimen and get follow up labs. - Comprehensive metabolic panel - Lipid Panel With LDL/HDL Ratio - TSH  4. Chronic pain syndrome Continues to have pain in the left groin and thigh pain with any active elevation  of the left leg or walking. 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 Resultant colostomy was reversed in 2011. Has been followed by pain management without significant improvement. Will recheck routine labs and recheck with Dr. Holley Raring  (pain management) prn. - CBC with Differential/Platelet - Comprehensive metabolic panel  5. Essential hypertension Tolerating Amlodipine 10 mg qd and Metoprolol Tartrate 25 mg BID with fair BP control. No palpitations, chest pains, dyspnea or peripheral edema. Continue present regimen and get follow up labs. - CBC with Differential/Platelet - Comprehensive metabolic panel - TSH  6. Colon cancer screening Had tubular adenoma and hyperplastic poly removed during colonoscopy by Dr. Bonna Gains (GI) on 01-09-18. States she was sent a letter to have a repeat test this year. Has a history of sigmoid injury and right ureteral injury during TAH in 2010 that required nephrostomy tube and Hartmannks procedure.  In 06/2009 she underwent exploratory laparotomy, extensive lysis of adhesions, colostomy takedown with colocolostomy side-to-side functional end-to-end anastomosis, ventral hernia repair, right ureterolysis and ureteral reimplant with psoas hitch and stent placement.   - Ambulatory referral to gastroenterology for colonoscopy  7. Breast mass, right Found 1.5 cm mass in the right breast during screening mammograms with ultrasound. Planned for biopsy at [facility] on 07-29-19. No local lymphadenopathy or pain. No history of nipple discharge.

## 2019-07-28 DIAGNOSIS — G894 Chronic pain syndrome: Secondary | ICD-10-CM | POA: Diagnosis not present

## 2019-07-28 DIAGNOSIS — I10 Essential (primary) hypertension: Secondary | ICD-10-CM | POA: Diagnosis not present

## 2019-07-28 DIAGNOSIS — N390 Urinary tract infection, site not specified: Secondary | ICD-10-CM | POA: Diagnosis not present

## 2019-07-28 DIAGNOSIS — E7800 Pure hypercholesterolemia, unspecified: Secondary | ICD-10-CM | POA: Diagnosis not present

## 2019-07-28 DIAGNOSIS — E119 Type 2 diabetes mellitus without complications: Secondary | ICD-10-CM | POA: Diagnosis not present

## 2019-07-29 ENCOUNTER — Telehealth: Payer: Self-pay

## 2019-07-29 ENCOUNTER — Ambulatory Visit: Payer: Self-pay | Admitting: Family Medicine

## 2019-07-29 ENCOUNTER — Other Ambulatory Visit: Payer: Self-pay

## 2019-07-29 DIAGNOSIS — Z79899 Other long term (current) drug therapy: Secondary | ICD-10-CM | POA: Diagnosis not present

## 2019-07-29 DIAGNOSIS — N6489 Other specified disorders of breast: Secondary | ICD-10-CM | POA: Diagnosis not present

## 2019-07-29 DIAGNOSIS — Z7982 Long term (current) use of aspirin: Secondary | ICD-10-CM | POA: Diagnosis not present

## 2019-07-29 DIAGNOSIS — R928 Other abnormal and inconclusive findings on diagnostic imaging of breast: Secondary | ICD-10-CM | POA: Diagnosis not present

## 2019-07-29 DIAGNOSIS — N6313 Unspecified lump in the right breast, lower outer quadrant: Secondary | ICD-10-CM | POA: Diagnosis not present

## 2019-07-29 DIAGNOSIS — Z1211 Encounter for screening for malignant neoplasm of colon: Secondary | ICD-10-CM

## 2019-07-29 DIAGNOSIS — N6314 Unspecified lump in the right breast, lower inner quadrant: Secondary | ICD-10-CM | POA: Diagnosis not present

## 2019-07-29 LAB — CBC WITH DIFFERENTIAL/PLATELET
Basophils Absolute: 0 10*3/uL (ref 0.0–0.2)
Basos: 1 %
EOS (ABSOLUTE): 0.1 10*3/uL (ref 0.0–0.4)
Eos: 2 %
Hematocrit: 35.1 % (ref 34.0–46.6)
Hemoglobin: 11.7 g/dL (ref 11.1–15.9)
Immature Grans (Abs): 0 10*3/uL (ref 0.0–0.1)
Immature Granulocytes: 0 %
Lymphocytes Absolute: 2.7 10*3/uL (ref 0.7–3.1)
Lymphs: 45 %
MCH: 30.3 pg (ref 26.6–33.0)
MCHC: 33.3 g/dL (ref 31.5–35.7)
MCV: 91 fL (ref 79–97)
Monocytes Absolute: 0.5 10*3/uL (ref 0.1–0.9)
Monocytes: 8 %
Neutrophils Absolute: 2.7 10*3/uL (ref 1.4–7.0)
Neutrophils: 44 %
Platelets: 374 10*3/uL (ref 150–450)
RBC: 3.86 x10E6/uL (ref 3.77–5.28)
RDW: 16 % — ABNORMAL HIGH (ref 11.7–15.4)
WBC: 6 10*3/uL (ref 3.4–10.8)

## 2019-07-29 LAB — COMPREHENSIVE METABOLIC PANEL
ALT: 16 IU/L (ref 0–32)
AST: 17 IU/L (ref 0–40)
Albumin/Globulin Ratio: 1.5 (ref 1.2–2.2)
Albumin: 4.4 g/dL (ref 3.8–4.9)
Alkaline Phosphatase: 114 IU/L (ref 39–117)
BUN/Creatinine Ratio: 17 (ref 9–23)
BUN: 18 mg/dL (ref 6–24)
Bilirubin Total: 0.4 mg/dL (ref 0.0–1.2)
CO2: 21 mmol/L (ref 20–29)
Calcium: 9.4 mg/dL (ref 8.7–10.2)
Chloride: 107 mmol/L — ABNORMAL HIGH (ref 96–106)
Creatinine, Ser: 1.04 mg/dL — ABNORMAL HIGH (ref 0.57–1.00)
GFR calc Af Amer: 68 mL/min/{1.73_m2} (ref >59)
GFR calc non Af Amer: 59 mL/min/{1.73_m2} — ABNORMAL LOW (ref >59)
Globulin, Total: 3 g/dL (ref 1.5–4.5)
Glucose: 179 mg/dL — ABNORMAL HIGH (ref 65–99)
Potassium: 4.1 mmol/L (ref 3.5–5.2)
Sodium: 143 mmol/L (ref 134–144)
Total Protein: 7.4 g/dL (ref 6.0–8.5)

## 2019-07-29 LAB — LIPID PANEL WITH LDL/HDL RATIO
Cholesterol, Total: 163 mg/dL (ref 100–199)
HDL: 40 mg/dL (ref >39)
LDL Chol Calc (NIH): 107 mg/dL — ABNORMAL HIGH (ref 0–99)
LDL/HDL Ratio: 2.7 ratio (ref 0.0–3.2)
Triglycerides: 82 mg/dL (ref 0–149)
VLDL Cholesterol Cal: 16 mg/dL (ref 5–40)

## 2019-07-29 LAB — TSH: TSH: 1.5 u[IU]/mL (ref 0.450–4.500)

## 2019-07-29 LAB — HEMOGLOBIN A1C
Est. average glucose Bld gHb Est-mCnc: 180 mg/dL
Hgb A1c MFr Bld: 7.9 % — ABNORMAL HIGH (ref 4.8–5.6)

## 2019-07-29 MED ORDER — NA SULFATE-K SULFATE-MG SULF 17.5-3.13-1.6 GM/177ML PO SOLN: 1.0000 | Freq: Once | ORAL | 0 refills | Status: AC

## 2019-07-29 MED ORDER — NITROFURANTOIN MONOHYD MACRO 100 MG PO CAPS: 100.0000 mg | ORAL_CAPSULE | Freq: Two times a day (BID) | ORAL | 0 refills | Status: DC

## 2019-07-29 NOTE — Telephone Encounter (Signed)
Mark chart as allergic to Bactrim and stop taking it. If still having any urinary tract symptoms, will switch to Macrobid 100 mg BID #14 tomorrow and recheck urine in 1 week. Metformin may look different if it is a different manufacturer, but, should be the same drug.

## 2019-07-29 NOTE — Telephone Encounter (Signed)
Pt reports took first dose of bactrim yesterday, had facial swelling, itching, diffuse facial rash. States took benadryl last night, and this AM.effective; mild swelling this AM, mild itching. Denies SOB, no difficulty swallowing. Pt also states "Metformin looks different than what I usually take." States label on prescription correct.   Pt has not taken meds this AM. Advised to hold meds until she is advised from practice. Also advised to discuss metformin with pharmacist. Care advise given, assured pt NT would route to practice for PCPs review.  Please advise:[phone]   Reason for Disposition . [1] Caller has NON-URGENT medication question about med that PCP prescribed AND [2] triager unable to answer question  Answer Assessment - Initial Assessment Questions 1. ONSET: "When did the swelling start?" (e.g., minutes, hours, days)     Yesterday afternoon 2. SEVERITY: "How swollen is it?"     Mild 3. ITCHING: "Is there any itching?" If so, ask: "How much?"   (Scale 1-10; mild, moderate or severe)    Itchy 4. PAIN: "Is the swelling painful to touch?" If so, ask: "How painful is it?"   (Scale 1-10; mild, moderate or severe)     no 5. CAUSE: "What do you think is causing the lip swelling?"     medication 6. RECURRENT SYMPTOM: "Have you had lip swelling before?" If so, ask: "When was the last time?" "What happened that time?"    no 7. OTHER SYMPTOMS: "Do you have any other symptoms?" (e.g., toothache)     no  Answer Assessment - Initial Assessment Questions 1.   NAME of MEDICATION: "What medicine are you calling about?"     Bactrim 2.   QUESTION: "What is your question?"    Causing swelling, itching 3.   PRESCRIBING HCP: "Who prescribed it?" Reason: if prescribed by specialist, call should be referred to that group.    D. Chrismon 4. SYMPTOMS: "Do you have any symptoms?"    Mild swelling of  Lips, face, itching 5. SEVERITY: If symptoms are present, ask "Are they mild, moderate or  severe?"     mild  Protocols used: MEDICATION QUESTION CALL-A-AH, LIP SWELLING-A-AH

## 2019-07-29 NOTE — Telephone Encounter (Signed)
Gastroenterology Pre-Procedure Review  Request Date: Friday 08/20/19 Requesting Physician: Dr. Allen Norris  PATIENT REVIEW QUESTIONS: The patient responded to the following health history questions as indicated:    1. Are you having any GI issues? no 2. Do you have a personal history of Polyps? no 3. Do you have a family history of Colon Cancer or Polyps? no 4. Diabetes Mellitus? no 5. Joint replacements in the past 12 months?no 6. Major health problems in the past 3 months?no 7. Any artificial heart valves, MVP, or defibrillator?no    MEDICATIONS & ALLERGIES:    Patient reports the following regarding taking any anticoagulation/antiplatelet therapy:   Plavix, Coumadin, Eliquis, Xarelto, Lovenox, Pradaxa, Brilinta, or Effient? no Aspirin? no  Patient confirms/reports the following medications:  Current Outpatient Medications  Medication Sig Dispense Refill  . amLODipine (NORVASC) 10 MG tablet TAKE 1 TABLET BY MOUTH  DAILY 90 tablet 3  . aspirin 81 MG tablet Take 81 mg by mouth daily.    Marland Kitchen atorvastatin (LIPITOR) 20 MG tablet TAKE 1 TABLET BY MOUTH  DAILY 90 tablet 3  . brompheniramine-pseudoephedrine-DM 30-2-10 MG/5ML syrup Take 5 mLs by mouth 4 (four) times daily as needed. 120 mL 0  . cyclobenzaprine (FLEXERIL) 10 MG tablet Take 10 mg by mouth 3 (three) times daily as needed for muscle spasms.    Marland Kitchen HYDROcodone-acetaminophen (NORCO) 7.5-325 MG tablet Take 1 tablet by mouth every 8 (eight) hours as needed for moderate pain. For chronic pain.  To fill on or after 03/18/2018, 04/16/2018 (Patient not taking: Reported on 07/27/2019) 90 tablet 0  . Lancets (ONETOUCH ULTRASOFT) lancets TEST BLOOD SUGAR BEFORE BREAKFAST DAILY. RETEST BLOOD SUGAR IF HAVING SYMPTOMS OF HYPOGLYCEMIA. 100 each 3  . metFORMIN (GLUCOPHAGE) 500 MG tablet Take 1 tablet (500 mg total) by mouth 2 (two) times daily with a meal. 60 tablet 3  . metoprolol tartrate (LOPRESSOR) 25 MG tablet TAKE 1 TABLET BY MOUTH TWO  TIMES DAILY 180  tablet 3  . ONETOUCH VERIO test strip TEST BLOOD GLUCOSE BEFORE  BREAKFAST DAILY . RECHECK  IF ANY SYMPTOMS OF  HYPOGLYCEMIA 100 each 3  . sulfamethoxazole-trimethoprim (BACTRIM DS) 800-160 MG tablet Take 1 tablet by mouth 2 (two) times daily. 20 tablet 0   No current facility-administered medications for this visit.    Patient confirms/reports the following allergies:  No Known Allergies  No orders of the defined types were placed in this encounter.   AUTHORIZATION INFORMATION Primary Insurance: 1D#: Group #:  Secondary Insurance: 1D#: Group #:  SCHEDULE INFORMATION: Date: 08/20/19 Time: Location:[facility]

## 2019-07-29 NOTE — Telephone Encounter (Signed)
Patient advised. RX sent to [facility]. Bactrim added to allergies.

## 2019-07-31 ENCOUNTER — Other Ambulatory Visit: Payer: Self-pay | Admitting: Family Medicine

## 2019-07-31 DIAGNOSIS — N390 Urinary tract infection, site not specified: Secondary | ICD-10-CM

## 2019-08-02 ENCOUNTER — Other Ambulatory Visit: Payer: Self-pay

## 2019-08-02 ENCOUNTER — Telehealth: Payer: Self-pay | Admitting: Family Medicine

## 2019-08-02 ENCOUNTER — Other Ambulatory Visit: Payer: Self-pay | Admitting: Family Medicine

## 2019-08-02 DIAGNOSIS — E119 Type 2 diabetes mellitus without complications: Secondary | ICD-10-CM

## 2019-08-02 MED ORDER — NITROFURANTOIN MONOHYD MACRO 100 MG PO CAPS: 100.0000 mg | ORAL_CAPSULE | Freq: Two times a day (BID) | ORAL | 0 refills | Status: DC

## 2019-08-02 MED ORDER — ONETOUCH ULTRASOFT LANCETS MISC: 3 refills | Status: DC

## 2019-08-02 NOTE — Telephone Encounter (Signed)
Pt called stating that she went to the pharmacy for her antibiotic and they told her they did not have a medication sent in for her. Pt also states that she received her strips but the pharmacy did not have a prescription for her pen. Please advise.     [id number] - [place], [place] [address]  Phone: [phone] Fax: [phone]  Not a 24 hour pharmacy; exact hours not known.

## 2019-08-02 NOTE — Telephone Encounter (Signed)
Call to pharmacy- patient did pick up the Bactrim sent in for her- there was also Macrobid- on the list that they did not receive. Patient states she did not get Rx for her lancets. Please review for need of addition antibiotic and refill of lancets

## 2019-08-02 NOTE — Telephone Encounter (Signed)
Rx for lancets and for Macrobid sent to pharmacy.

## 2019-08-03 ENCOUNTER — Telehealth: Payer: Self-pay

## 2019-08-03 ENCOUNTER — Other Ambulatory Visit: Payer: Self-pay

## 2019-08-03 MED ORDER — PEG 3350-KCL-NA BICARB-NACL 420 G PO SOLR: 4000.0000 mL | Freq: Once | ORAL | 0 refills | Status: AC

## 2019-08-03 NOTE — Telephone Encounter (Signed)
Patient has been informed that I can call in a generic bowel prep to [facility] in [place] for [place].  Rx has been sent electronically to the pharmacy.  Patient has been advised to begin bowel prep the evening before her procedure at 5pm.  Follow the mixing instructions to prepare.  Thanks,  [place], [place]

## 2019-08-04 ENCOUNTER — Other Ambulatory Visit: Payer: Self-pay

## 2019-08-04 MED ORDER — ONETOUCH DELICA PLUS LANCING MISC: 1.0000 | Freq: Two times a day (BID) | 3 refills | Status: DC

## 2019-08-04 MED ORDER — NITROFURANTOIN MONOHYD MACRO 100 MG PO CAPS: 100.0000 mg | ORAL_CAPSULE | Freq: Two times a day (BID) | ORAL | 0 refills | Status: DC

## 2019-08-04 NOTE — Progress Notes (Signed)
onetouch

## 2019-08-05 ENCOUNTER — Other Ambulatory Visit: Payer: Self-pay

## 2019-08-05 ENCOUNTER — Ambulatory Visit: Payer: Medicare HMO | Attending: Internal Medicine

## 2019-08-05 DIAGNOSIS — Z23 Encounter for immunization: Secondary | ICD-10-CM

## 2019-08-05 NOTE — Progress Notes (Signed)
   Covid-19 Vaccination Clinic  Name:  Emily Stokes    MRN: [id number] DOB: June 10, 1960  08/05/2019  [name] was observed post Covid-19 immunization for 15 minutes without incident. She was provided with Vaccine Information Sheet and instruction to access the V-Safe system.   [name] was instructed to call 911 with any severe reactions post vaccine: Marland Kitchen Difficulty breathing  . Swelling of face and throat  . A fast heartbeat  . A bad rash all over body  . Dizziness and weakness   Immunizations Administered    Name Date Dose VIS Date Route   Pfizer COVID-19 Vaccine 08/05/2019  9:08 AM 0.3 mL 04/23/2019 Intramuscular   Manufacturer: [employer]   Lot: [id number]   [place]: [id number]

## 2019-08-09 ENCOUNTER — Telehealth: Payer: Self-pay

## 2019-08-09 ENCOUNTER — Other Ambulatory Visit: Payer: Self-pay

## 2019-08-09 MED ORDER — NITROFURANTOIN MONOHYD MACRO 100 MG PO CAPS: 100.0000 mg | ORAL_CAPSULE | Freq: Two times a day (BID) | ORAL | 0 refills | Status: DC

## 2019-08-09 MED ORDER — ONETOUCH DELICA PLUS LANCET30G MISC: 1.0000 | Freq: Two times a day (BID) | 3 refills | Status: DC

## 2019-08-09 NOTE — Telephone Encounter (Signed)
Copied from [place] [id number]. Topic: General - Inquiry >> Aug 09, 2019 11:25 AM Richardo Priest, NT wrote: Reason for CRM: Patient called in stating pharmacy is advising her they have not received scripts from 3/22 or either for 3/24. Please advise and resend scripts as patient is needing them.

## 2019-08-18 ENCOUNTER — Other Ambulatory Visit: Payer: Self-pay

## 2019-08-18 ENCOUNTER — Other Ambulatory Visit
Admission: RE | Admit: 2019-08-18 | Discharge: 2019-08-18 | Disposition: A | Discharge status: Home / Self Care | Payer: Medicare HMO | Source: Ambulatory Visit | Attending: Gastroenterology | Admitting: Gastroenterology

## 2019-08-18 DIAGNOSIS — Z01812 Encounter for preprocedural laboratory examination: Secondary | ICD-10-CM | POA: Diagnosis not present

## 2019-08-18 DIAGNOSIS — Z20822 Contact with and (suspected) exposure to covid-19: Secondary | ICD-10-CM | POA: Insufficient documentation

## 2019-08-18 LAB — SARS CORONAVIRUS 2 (TAT 6-24 HRS): SARS Coronavirus 2: NEGATIVE

## 2019-08-20 ENCOUNTER — Ambulatory Visit: Payer: Medicare HMO | Admitting: Certified Registered"

## 2019-08-20 ENCOUNTER — Ambulatory Visit
Admission: RE | Admit: 2019-08-20 | Discharge: 2019-08-20 | Disposition: A | Discharge status: Home / Self Care | Payer: Medicare HMO | Attending: Gastroenterology | Admitting: Gastroenterology

## 2019-08-20 ENCOUNTER — Encounter
Admission: RE | Disposition: A | Discharge status: Home / Self Care | Payer: Self-pay | Source: Home / Self Care | Attending: Gastroenterology

## 2019-08-20 ENCOUNTER — Other Ambulatory Visit: Payer: Self-pay

## 2019-08-20 ENCOUNTER — Encounter: Payer: Self-pay | Admitting: Gastroenterology

## 2019-08-20 DIAGNOSIS — M1990 Unspecified osteoarthritis, unspecified site: Secondary | ICD-10-CM | POA: Insufficient documentation

## 2019-08-20 DIAGNOSIS — Z8614 Personal history of Methicillin resistant Staphylococcus aureus infection: Secondary | ICD-10-CM | POA: Insufficient documentation

## 2019-08-20 DIAGNOSIS — Z8249 Family history of ischemic heart disease and other diseases of the circulatory system: Secondary | ICD-10-CM | POA: Insufficient documentation

## 2019-08-20 DIAGNOSIS — Z823 Family history of stroke: Secondary | ICD-10-CM | POA: Diagnosis not present

## 2019-08-20 DIAGNOSIS — Z1211 Encounter for screening for malignant neoplasm of colon: Secondary | ICD-10-CM | POA: Diagnosis not present

## 2019-08-20 DIAGNOSIS — Z7982 Long term (current) use of aspirin: Secondary | ICD-10-CM | POA: Insufficient documentation

## 2019-08-20 DIAGNOSIS — E119 Type 2 diabetes mellitus without complications: Secondary | ICD-10-CM | POA: Diagnosis not present

## 2019-08-20 DIAGNOSIS — Z833 Family history of diabetes mellitus: Secondary | ICD-10-CM | POA: Diagnosis not present

## 2019-08-20 DIAGNOSIS — E1151 Type 2 diabetes mellitus with diabetic peripheral angiopathy without gangrene: Secondary | ICD-10-CM | POA: Diagnosis not present

## 2019-08-20 DIAGNOSIS — I10 Essential (primary) hypertension: Secondary | ICD-10-CM | POA: Diagnosis not present

## 2019-08-20 DIAGNOSIS — K640 First degree hemorrhoids: Secondary | ICD-10-CM | POA: Diagnosis not present

## 2019-08-20 DIAGNOSIS — K635 Polyp of colon: Secondary | ICD-10-CM | POA: Diagnosis not present

## 2019-08-20 DIAGNOSIS — D649 Anemia, unspecified: Secondary | ICD-10-CM | POA: Insufficient documentation

## 2019-08-20 DIAGNOSIS — Z860100 Personal history of colon polyps, unspecified: Secondary | ICD-10-CM

## 2019-08-20 DIAGNOSIS — Z7984 Long term (current) use of oral hypoglycemic drugs: Secondary | ICD-10-CM | POA: Diagnosis not present

## 2019-08-20 DIAGNOSIS — E7800 Pure hypercholesterolemia, unspecified: Secondary | ICD-10-CM | POA: Insufficient documentation

## 2019-08-20 DIAGNOSIS — Z90710 Acquired absence of both cervix and uterus: Secondary | ICD-10-CM | POA: Insufficient documentation

## 2019-08-20 DIAGNOSIS — Z79899 Other long term (current) drug therapy: Secondary | ICD-10-CM | POA: Insufficient documentation

## 2019-08-20 DIAGNOSIS — Z882 Allergy status to sulfonamides status: Secondary | ICD-10-CM | POA: Diagnosis not present

## 2019-08-20 DIAGNOSIS — G709 Myoneural disorder, unspecified: Secondary | ICD-10-CM | POA: Insufficient documentation

## 2019-08-20 HISTORY — PX: COLONOSCOPY WITH PROPOFOL: SHX5780

## 2019-08-20 LAB — GLUCOSE, CAPILLARY: Glucose-Capillary: 150 mg/dL — ABNORMAL HIGH (ref 70–99)

## 2019-08-20 SURGERY — COLONOSCOPY WITH PROPOFOL
Anesthesia: General

## 2019-08-20 MED ORDER — PROPOFOL 10 MG/ML IV BOLUS
INTRAVENOUS | Status: DC | PRN
  Administered 2019-08-20: 50 mg via INTRAVENOUS

## 2019-08-20 MED ORDER — LIDOCAINE HCL (CARDIAC) PF 100 MG/5ML IV SOSY
PREFILLED_SYRINGE | INTRAVENOUS | Status: DC | PRN
  Administered 2019-08-20: 100 mg via INTRATRACHEAL

## 2019-08-20 MED ORDER — SODIUM CHLORIDE 0.9 % IV SOLN
INTRAVENOUS | Status: DC
  Administered 2019-08-20: 1000 mL via INTRAVENOUS

## 2019-08-20 MED ORDER — PROPOFOL 500 MG/50ML IV EMUL
INTRAVENOUS | Status: DC | PRN
  Administered 2019-08-20: 155 ug/kg/min via INTRAVENOUS

## 2019-08-20 MED ORDER — GLYCOPYRROLATE 0.2 MG/ML IJ SOLN
INTRAMUSCULAR | Status: DC | PRN
  Administered 2019-08-20: .2 mg via INTRAVENOUS

## 2019-08-20 NOTE — Op Note (Signed)
[facility] Gastroenterology Patient Name: Emily Stokes Procedure Date: 08/20/2019 8:49 AM MRN: [id number] Account #: [id number] Date of Birth: [birth year] Admit Type: Outpatient Age: 59 Room: [facility] ENDO ROOM 4 Gender: Female Note Status: Finalized Procedure:             Colonoscopy Indications:           High risk colon cancer surveillance: Personal history                         of colonic polyps Providers:             Lucilla Lame MD, MD Referring MD:          [name]. Chrismon, MD (Referring MD) Medicines:             Propofol per Anesthesia Complications:         No immediate complications. Procedure:             Pre-Anesthesia Assessment:                        - Prior to the procedure, a History and Physical was                         performed, and patient medications and allergies were                         reviewed. The patient's tolerance of previous                         anesthesia was also reviewed. The risks and benefits                         of the procedure and the sedation options and risks                         were discussed with the patient. All questions were                         answered, and informed consent was obtained. Prior                         Anticoagulants: The patient has taken no previous                         anticoagulant or antiplatelet agents. ASA Grade                         Assessment: II - A patient with mild systemic disease.                         After reviewing the risks and benefits, the patient                         was deemed in satisfactory condition to undergo the                         procedure.  After obtaining informed consent, the colonoscope was                         passed under direct vision. Throughout the procedure,                         the patient's blood pressure, pulse, and oxygen                         saturations were monitored continuously. The                  Colonoscope was introduced through the anus and                         advanced to the the cecum, identified by appendiceal                         orifice and ileocecal valve. The colonoscopy was                         performed without difficulty. The patient tolerated                         the procedure well. The quality of the bowel                         preparation was excellent. Findings:      The perianal and digital rectal examinations were normal.      A 2 mm polyp was found in the descending colon. The polyp was sessile.       The polyp was removed with a cold biopsy forceps. Resection and       retrieval were complete.      Non-bleeding internal hemorrhoids were found during retroflexion. The       hemorrhoids were Grade I (internal hemorrhoids that do not prolapse). Impression:            - One 2 mm polyp in the descending colon, removed with                         a cold biopsy forceps. Resected and retrieved.                        - Non-bleeding internal hemorrhoids. Recommendation:        - Discharge patient to home.                        - Resume previous diet.                        - Continue present medications.                        - Await pathology results.                        - Repeat colonoscopy in 5 years for surveillance. Procedure Code(s):     --- Professional ---  45380, Colonoscopy, flexible; with biopsy, single or                         multiple Diagnosis Code(s):     --- Professional ---                        Z86.010, Personal history of colonic polyps                        K63.5, Polyp of colon CPT copyright 2019 American Medical Association. All rights reserved. The codes documented in this report are preliminary and upon coder review may  be revised to meet current compliance requirements. Lucilla Lame MD, MD 08/20/2019 9:09:43 AM This report has been signed electronically. Number of Addenda: 0 Note  Initiated On: 08/20/2019 8:49 AM Scope Withdrawal Time: 0 hours 8 minutes 18 seconds  Total Procedure Duration: 0 hours 9 minutes 41 seconds  Estimated Blood Loss:  Estimated blood loss: none.      [facility]

## 2019-08-20 NOTE — Anesthesia Postprocedure Evaluation (Signed)
Anesthesia Post Note  Patient: Emily Stokes  Procedure(s) Performed: COLONOSCOPY WITH PROPOFOL (N/A )  Patient location during evaluation: Endoscopy Anesthesia Type: General Level of consciousness: awake and alert and oriented Pain management: pain level controlled Vital Signs Assessment: post-procedure vital signs reviewed and stable Respiratory status: spontaneous breathing Cardiovascular status: blood pressure returned to baseline Anesthetic complications: no     Last Vitals:  Vitals:   08/20/19 0922 08/20/19 0932  BP: (!) 122/94 123/78  Pulse: 93 80  Resp: 17 13  Temp:    SpO2: 98% 96%    Last Pain:  Vitals:   08/20/19 0932  TempSrc:   PainSc: 0-No pain                 Jafari Mckillop

## 2019-08-20 NOTE — H&P (Signed)
Lucilla Lame, MD [facility] [address] [place], [place] [address] Phone:[phone] Fax : [phone]  Primary Care Physician:  Margo Common, [place] Primary Gastroenterologist:  Dr. Allen Norris  Pre-Procedure History & Physical: HPI:  Emily Stokes is a 58 y.o. female is here for an colonoscopy.   Past Medical History:  Diagnosis Date  . Abdominal pain, epigastric   . Anemia   . Backache   . Bronchitis   . Calculus, kidney   . Carpal tunnel syndrome   . Chest pain   . Chicken pox   . Circulatory disease   . Disorder of kidney and ureter   . Excess, menstruation   . Hypercholesteremia   . Hypertension, essential, benign   . Infected postoperative seroma   . Iron deficiency   . Malaise and fatigue   . Measles   . MRSA (methicillin resistant staph aureus) culture positive   . Nausea alone   . Nonspecific abnormal electrocardiogram (ECG) (EKG)   . Pain in joint, lower leg     Past Surgical History:  Procedure Laterality Date  . ABDOMINAL HYSTERECTOMY    . CESAREAN SECTION    . COLONOSCOPY  1997  . COLONOSCOPY WITH PROPOFOL N/A 01/09/2018   Procedure: COLONOSCOPY WITH PROPOFOL;  Surgeon: Virgel Manifold, MD;  Location: [facility] ENDOSCOPY;  Service: Endoscopy;  Laterality: N/A;  . COLOSTOMY TAKEDOWN  2012  . Hartmann's procedure.    Marland Kitchen HERNIA REPAIR  0000000   umbilical  . LITHOTRIPSY  0000000   with complications, hospitalized due to these complications for 3 months  . mrsa     removal on nose  . PARTIAL HYSTERECTOMY  2009    vaginal hysterectomy, has both ovaries    Prior to Admission medications   Medication Sig Start Date End Date Taking? Authorizing Provider  amLODipine (NORVASC) 10 MG tablet TAKE 1 TABLET BY MOUTH  DAILY 02/04/19  Yes Chrismon, [name], PA  aspirin 81 MG tablet Take 81 mg by mouth daily.   Yes [provider]  atorvastatin (LIPITOR) 20 MG tablet TAKE 1 TABLET BY MOUTH  DAILY 02/04/19  Yes Chrismon, [name], PA    brompheniramine-pseudoephedrine-DM 30-2-10 MG/5ML syrup Take 5 mLs by mouth 4 (four) times daily as needed. 02/08/19  Yes Chrismon, [name], PA  cyclobenzaprine (FLEXERIL) 10 MG tablet Take 10 mg by mouth 3 (three) times daily as needed for muscle spasms.   Yes [provider]  HYDROcodone-acetaminophen (NORCO) 7.5-325 MG tablet Take 1 tablet by mouth every 8 (eight) hours as needed for moderate pain. For chronic pain.  To fill on or after 03/18/2018, 04/16/2018 03/18/18  Yes Gillis Santa, MD  Lancets (ONETOUCH DELICA PLUS Q000111Q) [place] 1 each by Does not apply route in the morning and at bedtime. 08/09/19  Yes Chrismon, [name], PA  metFORMIN (GLUCOPHAGE) 500 MG tablet Take 1 tablet (500 mg total) by mouth 2 (two) times daily with a meal. 07/27/19  Yes Chrismon, [name], PA  metoprolol tartrate (LOPRESSOR) 25 MG tablet TAKE 1 TABLET BY MOUTH TWO  TIMES DAILY 12/04/18  Yes Chrismon, [name], PA  nitrofurantoin, macrocrystal-monohydrate, (MACROBID) 100 MG capsule Take 1 capsule (100 mg total) by mouth 2 (two) times daily. 08/09/19  Yes Chrismon, [name], PA  ONETOUCH VERIO test strip TEST BLOOD GLUCOSE BEFORE  BREAKFAST DAILY . RECHECK  IF ANY SYMPTOMS OF  HYPOGLYCEMIA 10/16/18  Yes Chrismon, [name], PA    Allergies as of 07/29/2019 - Review  Complete 07/27/2019  Allergen Reaction Noted  . Bactrim [sulfamethoxazole-trimethoprim] Swelling 07/29/2019    Family History  Problem Relation Age of Onset  . Hypertension Father   . Stroke Father   . Diabetes Mother   . Hypertension Mother   . Diabetes Other        sibling  . Diabetes Other        sibling  . Diabetes Other        sibling  . Hypertension Other        sibling  . Hypertension Other        sibling  . Hypertension Other        sibling    Social History   Socioeconomic History  . Marital status: Widowed    Spouse name: Not on file  . Number of children: 4  . Years of education: Not on file  . Highest education  level: Bachelor's degree (e.g., BA, AB, BS)  Occupational History  . Occupation: Control and instrumentation engineer    Comment: disability  Tobacco Use  . Smoking status: Never Smoker  . Smokeless tobacco: Never Used  Substance and Sexual Activity  . Alcohol use: No    Alcohol/week: 0.0 standard drinks  . Drug use: No  . Sexual activity: Never  Other Topics Concern  . Not on file  Social History Narrative  . Not on file   Social Determinants of Health   Financial Resource Strain:   . Difficulty of Paying Living Expenses:   Food Insecurity:   . Worried About Charity fundraiser in the Last Year:   . Arboriculturist in the Last Year:   Transportation Needs:   . Film/video editor (Medical):   Marland Kitchen Lack of Transportation (Non-Medical):   Physical Activity: Inactive  . Days of Exercise per Week: 0 days  . Minutes of Exercise per Session: 0 min  Stress: No Stress Concern Present  . Feeling of Stress : Not at all  Social Connections: Unknown  . Frequency of Communication with Friends and Family: Patient refused  . Frequency of Social Gatherings with Friends and Family: Patient refused  . Attends Religious Services: Patient refused  . Active Member of Clubs or Organizations: Patient refused  . Attends Archivist Meetings: Patient refused  . Marital Status: Patient refused  Intimate Partner Violence: Unknown  . Fear of Current or Ex-Partner: Patient refused  . Emotionally Abused: Patient refused  . Physically Abused: Patient refused  . Sexually Abused: Patient refused    Review of Systems: See HPI, otherwise negative ROS  Physical Exam: BP (!) 137/92   Pulse 89   Temp (!) 96.8 F (36 C) (Temporal)   Resp 20   Ht 5\' 9"  (1.753 m)   Wt 108 kg   SpO2 98%   BMI 35.15 kg/m  General:   Alert,  pleasant and cooperative in NAD Head:  Normocephalic and atraumatic. Neck:  Supple; no masses or thyromegaly. Lungs:  Clear throughout to auscultation.    Heart:  Regular rate and  rhythm. Abdomen:  Soft, nontender and nondistended. Normal bowel sounds, without guarding, and without rebound.   Neurologic:  Alert and  oriented x4;  grossly normal neurologically.  Impression/Plan: Emily Stokes is here for an colonoscopy to be performed for adenomatous polyps 03/2018 and poor prep  Risks, benefits, limitations, and alternatives regarding  colonoscopy have been reviewed with the patient.  Questions have been answered.  All parties agreeable.   Lucilla Lame, MD  08/20/2019, 8:42 AM

## 2019-08-20 NOTE — Transfer of Care (Signed)
Immediate Anesthesia Transfer of Care Note  Patient: Emily Stokes  Procedure(s) Performed: COLONOSCOPY WITH PROPOFOL (N/A )  Patient Location: Endoscopy Unit  Anesthesia Type:General  Level of Consciousness: awake, alert , oriented and patient cooperative  Airway & Oxygen Therapy: Patient Spontanous Breathing and Patient connected to face mask oxygen  Post-op Assessment: Report given to RN and Post -op Vital signs reviewed and stable  Post vital signs: Reviewed and stable  Last Vitals:  Vitals Value Taken Time  BP 104/65 08/20/19 0912  Temp 36.3 C 08/20/19 0912  Pulse 89 08/20/19 0917  Resp 18 08/20/19 0917  SpO2 98 % 08/20/19 0917  Vitals shown include unvalidated device data.  Last Pain:  Vitals:   08/20/19 0821  TempSrc: Temporal  PainSc: 0-No pain         Complications: No apparent anesthesia complications

## 2019-08-20 NOTE — Anesthesia Preprocedure Evaluation (Signed)
Anesthesia Evaluation  Patient identified by MRN, date of birth, ID band Patient awake    Reviewed: Allergy & Precautions, H&P , NPO status , Patient's Chart, lab work & pertinent test results  History of Anesthesia Complications Negative for: history of anesthetic complications  Airway Mallampati: III  TM Distance: >3 FB Neck ROM: full    Dental  (+) Chipped, Poor Dentition   Pulmonary neg pulmonary ROS, neg shortness of breath,           Cardiovascular Exercise Tolerance: Good hypertension, (-) angina+ Peripheral Vascular Disease  (-) Past MI and (-) DOE      Neuro/Psych  Neuromuscular disease negative psych ROS   GI/Hepatic negative GI ROS, Neg liver ROS, neg GERD  ,  Endo/Other  diabetes, Type 2  Renal/GU Renal disease  negative genitourinary   Musculoskeletal  (+) Arthritis ,   Abdominal   Peds  Hematology negative hematology ROS (+) anemia ,   Anesthesia Other Findings Past Medical History: No date: Abdominal pain, epigastric No date: Anemia No date: Backache No date: Bronchitis No date: Calculus, kidney No date: Carpal tunnel syndrome No date: Chest pain No date: Chicken pox No date: Circulatory disease No date: Disorder of kidney and ureter No date: Excess, menstruation No date: Hypercholesteremia No date: Hypertension, essential, benign No date: Infected postoperative seroma No date: Iron deficiency No date: Malaise and fatigue No date: Measles No date: MRSA (methicillin resistant staph aureus) culture positive No date: Nausea alone No date: Nonspecific abnormal electrocardiogram (ECG) (EKG) No date: Pain in joint, lower leg  Past Surgical History: No date: ABDOMINAL HYSTERECTOMY No date: CESAREAN SECTION 1997: COLONOSCOPY 2012: COLOSTOMY TAKEDOWN No date: Hartmann's procedure. 2012: HERNIA REPAIR     Comment:  umbilical 0000000: LITHOTRIPSY     Comment:  with complications, hospitalized  due to these               complications for 3 months No date: mrsa     Comment:  removal on nose 2009: PARTIAL HYSTERECTOMY     Comment:   vaginal hysterectomy, has both ovaries  BMI    Body Mass Index:  35.15 kg/m      Reproductive/Obstetrics negative OB ROS                             Anesthesia Physical  Anesthesia Plan  ASA: III  Anesthesia Plan: General   Post-op Pain Management:    Induction: Intravenous  PONV Risk Score and Plan: Propofol infusion and TIVA  Airway Management Planned: Natural Airway and Nasal Cannula  Additional Equipment:   Intra-op Plan:   Post-operative Plan:   Informed Consent: I have reviewed the patients History and Physical, chart, labs and discussed the procedure including the risks, benefits and alternatives for the proposed anesthesia with the patient or authorized representative who has indicated his/her understanding and acceptance.     Dental Advisory Given  Plan Discussed with: Anesthesiologist, CRNA and Surgeon  Anesthesia Plan Comments: (Patient consented for risks of anesthesia including but not limited to:  - adverse reactions to medications - risk of intubation if required - damage to teeth, lips or other oral mucosa - sore throat or hoarseness - Damage to heart, brain, lungs or loss of life  Patient voiced understanding.)        Anesthesia Quick Evaluation

## 2019-08-23 LAB — SURGICAL PATHOLOGY

## 2019-08-24 ENCOUNTER — Encounter: Payer: Self-pay | Admitting: Gastroenterology

## 2019-08-31 ENCOUNTER — Ambulatory Visit: Payer: Medicare HMO | Attending: Internal Medicine

## 2019-08-31 DIAGNOSIS — Z23 Encounter for immunization: Secondary | ICD-10-CM

## 2019-08-31 NOTE — Progress Notes (Signed)
   Covid-19 Vaccination Clinic  Name:  Emily Stokes    MRN: [id number] DOB: January 09, 1961  08/31/2019  Emily Stokes was observed post Covid-19 immunization for 15 minutes without incident. She was provided with Vaccine Information Sheet and instruction to access the V-Safe system.   Emily Stokes was instructed to call 911 with any severe reactions post vaccine: Marland Kitchen Difficulty breathing  . Swelling of face and throat  . A fast heartbeat  . A bad rash all over body  . Dizziness and weakness   Immunizations Administered    Name Date Dose VIS Date Route   Pfizer COVID-19 Vaccine 08/31/2019  8:56 AM 0.3 mL 07/07/2018 Intramuscular   Manufacturer: [employer], [employer]   Lot: [id number]   Christine: [id number]

## 2019-09-01 ENCOUNTER — Telehealth: Payer: Self-pay

## 2019-09-01 ENCOUNTER — Telehealth: Payer: Self-pay | Admitting: Family Medicine

## 2019-09-01 DIAGNOSIS — I10 Essential (primary) hypertension: Secondary | ICD-10-CM

## 2019-09-01 MED ORDER — AMLODIPINE BESYLATE 10 MG PO TABS: 10.0000 mg | ORAL_TABLET | Freq: Every day | ORAL | 0 refills | Status: DC

## 2019-09-01 MED ORDER — ATORVASTATIN CALCIUM 20 MG PO TABS: 20.0000 mg | ORAL_TABLET | Freq: Every day | ORAL | 0 refills | Status: DC

## 2019-09-01 NOTE — Telephone Encounter (Signed)
Appointment has been made for the patient and she has been notified

## 2019-09-01 NOTE — Telephone Encounter (Signed)
Per initial encounter, "Pt called stating that she has finished the antibiotic for her UTI and is requesting to know if she should come back in for another urine sample. Pt  States that she is still urinating frequently, but not as much"; contacted pt regarding her symptoms; she states that she completed her Nitrofurantoin a week ago; she continues to have urinary frequency, but it has decreased; the pt can be contacted at [phone]; will route to office for final disposition.  Medication Refill - Medication: Atorvastatin, amlodipine   Has the patient contacted their pharmacy? Yes.   (Agent: If no, request that the patient contact the pharmacy for the refill.) (Agent: If yes, when and what did the pharmacy advise?)  Preferred Pharmacy (with phone number or street name):  [place], [place] [address]  Phone: [phone] Fax: [phone]  Not a 24 hour pharmacy; exact hours not known.    Agent: Please be advised that RX refills may take up to 3 business days. We ask that you follow-up with your pharmacy.

## 2019-09-01 NOTE — Telephone Encounter (Signed)
Simona Huh, I have put an application on your desk.  I just need you to sign it and give back to me and I will let Peja know when it is ready.   Thanks [place]  Copied from [employer] [id number]. Topic: General - Other >> Sep 01, 2019  8:21 AM Celene Kras wrote: Reason for CRM: Pt called and is requesting to renew her handicap placard. Pt states that it expires in May. Please advise.

## 2019-09-01 NOTE — Progress Notes (Signed)
Established patient visit    Patient: Emily Stokes   DOB: Dec 14, 1960   59 y.o. Female  MRN: [id number] Visit Date: 09/02/2019  Today's healthcare provider: [name], PA   Chief Complaint  Patient presents with  . Urinary Tract Infection   Subjective    I,Latasha Walston,acting as a scribe for [employer], [employer].,have documented all relevant documentation on the behalf of [employer], PA,as directed by  [employer], PA while in the presence of [employer], [place].  Follow up for UTI  The patient was last seen for this 07/27/2019 Changes made at last visit include started Macrobid.  She reports good compliance with treatment. She feels that condition is slightly Improved. She is not having side effects.  She is experiencing frequency  -----------------------------------------------------------------------------------------   Patient Active Problem List   Diagnosis Date Noted  . Personal history of colonic polyps   . Polyp of transverse colon   . Lumbar facet arthropathy 03/18/2018  . Lumbar spondylosis 03/18/2018  . Encounter for screening colonoscopy   . Internal hemorrhoids   . Benign neoplasm of descending colon   . Benign neoplasm of cecum   . Type 2 diabetes mellitus without complication, without long-term current use of insulin ([place]) 07/31/2017  . Recurrent abdominal hernia without obstruction or gangrene 07/24/2017  . Cellulitis of face 05/30/2016  . Chronic pain syndrome 05/30/2016  . Nasal septal abscess 05/30/2016  . Pain in elbow 02/05/2016  . Lumbar degenerative disc disease 09/14/2015  . Facet syndrome, lumbar 09/14/2015  . Lumbar radiculopathy 09/14/2015  . Sacroiliac joint dysfunction 09/14/2015  . History of chicken pox 02/02/2015  . Leg pain 02/02/2015  . Hypercholesteremia 02/02/2015  . Blood glucose elevated 02/02/2015  . Pain in soft tissues of limb 02/02/2015  . Nonspecific ST-T changes 02/02/2015  . Awareness of  heartbeats 02/02/2015  . Pain in extremity at multiple sites 02/02/2015  . Snores 02/02/2015  . Hernia of anterior abdominal wall 02/24/2012  . Pain in shoulder 12/05/2011  . Adnexal mass 10/30/2011  . Pelvic cyst 10/30/2011  . Palpitations 03/14/2011  . HTN (hypertension) 03/14/2011  . Abnormal finding on EKG 03/14/2011  . Obesity 03/14/2011  . Infected surgical wound 09/15/2008  . Urinary system disease 08/10/2008  . Benign essential HTN 07/12/2008  . Arthralgia of lower leg 03/28/2008  . Anemia, iron deficiency 06/17/2007  . Calculus of kidney 05/26/2007   Past Surgical History:  Procedure Laterality Date  . ABDOMINAL HYSTERECTOMY    . CESAREAN SECTION    . COLONOSCOPY  1997  . COLONOSCOPY WITH PROPOFOL N/A 01/09/2018   Procedure: COLONOSCOPY WITH PROPOFOL;  Surgeon: Virgel Manifold, MD;  Location: [facility] ENDOSCOPY;  Service: Endoscopy;  Laterality: N/A;  . COLONOSCOPY WITH PROPOFOL N/A 08/20/2019   Procedure: COLONOSCOPY WITH PROPOFOL;  Surgeon: Lucilla Lame, MD;  Location: [facility] ENDOSCOPY;  Service: Endoscopy;  Laterality: N/A;  . COLOSTOMY TAKEDOWN  2012  . Hartmann's procedure.    Marland Kitchen HERNIA REPAIR  0000000   umbilical  . LITHOTRIPSY  0000000   with complications, hospitalized due to these complications for 3 months  . mrsa     removal on nose  . PARTIAL HYSTERECTOMY  2009    vaginal hysterectomy, has both ovaries   Social History   Tobacco Use  . Smoking status: Never Smoker  . Smokeless tobacco: Never Used  Substance Use Topics  . Alcohol use: No    Alcohol/week: 0.0 standard drinks  . Drug use: No  Family Status  Relation Name Status  . Father  (Not Specified)  . Mother  (Not Specified)  . Other  (Not Specified)  . Other  (Not Specified)  . Other  (Not Specified)  . Other  (Not Specified)  . Other  (Not Specified)  . Other  (Not Specified)   Allergies  Allergen Reactions  . Bactrim [Sulfamethoxazole-Trimethoprim] Swelling        Medications: Outpatient Medications Prior to Visit  Medication Sig  . amLODipine (NORVASC) 10 MG tablet Take 1 tablet (10 mg total) by mouth daily.  Marland Kitchen aspirin 81 MG tablet Take 81 mg by mouth daily.  Marland Kitchen atorvastatin (LIPITOR) 20 MG tablet Take 1 tablet (20 mg total) by mouth daily.  . brompheniramine-pseudoephedrine-DM 30-2-10 MG/5ML syrup Take 5 mLs by mouth 4 (four) times daily as needed.  . cyclobenzaprine (FLEXERIL) 10 MG tablet Take 10 mg by mouth 3 (three) times daily as needed for muscle spasms.  Marland Kitchen HYDROcodone-acetaminophen (NORCO) 7.5-325 MG tablet Take 1 tablet by mouth every 8 (eight) hours as needed for moderate pain. For chronic pain.  To fill on or after 03/18/2018, 04/16/2018  . Lancets (ONETOUCH DELICA PLUS Q000111Q) MISC 1 each by Does not apply route in the morning and at bedtime.  . metFORMIN (GLUCOPHAGE) 500 MG tablet Take 1 tablet (500 mg total) by mouth 2 (two) times daily with a meal.  . metoprolol tartrate (LOPRESSOR) 25 MG tablet TAKE 1 TABLET BY MOUTH TWO  TIMES DAILY  . ONETOUCH VERIO test strip TEST BLOOD GLUCOSE BEFORE  BREAKFAST DAILY . RECHECK  IF ANY SYMPTOMS OF  HYPOGLYCEMIA  . [DISCONTINUED] nitrofurantoin, macrocrystal-monohydrate, (MACROBID) 100 MG capsule Take 1 capsule (100 mg total) by mouth 2 (two) times daily.   No facility-administered medications prior to visit.    Review of Systems  Constitutional: Negative.   Respiratory: Negative.   Cardiovascular: Negative.   Genitourinary: Positive for frequency.  Musculoskeletal: Positive for back pain.       Objective    BP 135/81 (BP Location: Right Arm, Patient Position: Sitting, Cuff Size: Large)   Pulse (!) 111   Temp (!) 96.9 F (36.1 C) (Temporal)   Wt 235 lb 3.2 oz (106.7 kg)   BMI 34.73 kg/m    Physical Exam Constitutional:      Appearance: Normal appearance. She is obese.  HENT:     Head: Normocephalic and atraumatic.     Mouth/Throat:     Pharynx: Oropharynx is clear.  Eyes:      Pupils: Pupils are equal, round, and reactive to light.  Cardiovascular:     Rate and Rhythm: Normal rate and regular rhythm.     Pulses: Normal pulses.     Heart sounds: Normal heart sounds.  Pulmonary:     Effort: Pulmonary effort is normal.     Breath sounds: Normal breath sounds.  Abdominal:     General: Bowel sounds are normal.     Palpations: Abdomen is soft.     Tenderness: There is no right CVA tenderness or left CVA tenderness.     Comments: Bladder pressure  Musculoskeletal:        General: Normal range of motion.  Skin:    General: Skin is warm and dry.  Neurological:     General: No focal deficit present.     Mental Status: She is alert and oriented to person, place, and time.     Deep Tendon Reflexes: Abnormal reflex: lower extremity bilateral, upper extremities bilateral 2+  Comments: Numbness anterior left thigh. Unable to elicit lower extremity DTR's bilaterally.  SLR 75 degrees left leg generates pain.   Psychiatric:        Mood and Affect: Mood normal.        Behavior: Behavior normal.        Thought Content: Thought content normal.        Judgment: Judgment normal.       Results for orders placed or performed in visit on 09/02/19  POCT Urinalysis Dipstick  Result Value Ref Range   Color, UA yellow    Clarity, UA clear    Glucose, UA Negative Negative   Bilirubin, UA negative    Ketones, UA negative    Spec Grav, UA 1.020 1.010 - 1.025   Blood, UA nonhemolyzed trace    pH, UA 6.0 5.0 - 8.0   Protein, UA Positive (A) Negative   Urobilinogen, UA 0.2 0.2 or 1.0 E.U./dL   Nitrite, UA positive    Leukocytes, UA Moderate (2+) (A) Negative   Appearance     Odor       Assessment & Plan    1. Acute cystitis without hematuria Will try Cipro 250 mg for 10 days. Send urine for culture with sensitivity report. Can try Azo for bladder spasms and drink extra fluids to flush out urinary tract. Urine microscopic exam showed 4+ bacteria and WBC's per hpf.    - Urine Culture - ciprofloxacin (CIPRO) 250 MG tablet; Take 1 tablet (250 mg total) by mouth 2 (two) times daily.  Dispense: 20 tablet; Refill: 0  2. Urinary frequency Recurrence of frequency since finishing Macrobid 100 mg BID from 08-09-19. Recheck urine culture and increase water intake.  - POCT Urinalysis Dipstick  3. Lumbar back pain with radiculopathy affecting left lower extremity Having low back pain over the sacrum radiating down the left leg to the foot. Numbness in the left anterior thigh and significant pain in the left leg with SLR's to 75 degrees. With history of nerve damage from past abdominal surgery for hysterectomy with right ureter transection and colon perforation in 2010. Colostomy was reversed in 2011. Recommend recheck with Dr. Holley Raring (pain management). -schedule follow up with pain clinic   Return in about 3 months (around 12/02/2019).      Andres Shad, PA, have reviewed all documentation for this visit. The documentation on 09/02/19 for the exam, diagnosis, procedures, and orders are all accurate and complete.    [name], [place] [phone] (phone) [phone] (fax)  [place]

## 2019-09-01 NOTE — Telephone Encounter (Signed)
Pt called stating that she has finished the antibiotic for her UTI and is requesting to know if she should come back in for another urine sample. Pt  States that she is still urinating frequently, but not as much.    Medication Refill - Medication: Atorvastatin, amlodipine   Has the patient contacted their pharmacy? Yes.   (Agent: If no, request that the patient contact the pharmacy for the refill.) (Agent: If yes, when and what did the pharmacy advise?)  Preferred Pharmacy (with phone number or street name):  [facility], [place] [address]  Phone: [phone] Fax: [phone]  Not a 24 hour pharmacy; exact hours not known.    Agent: Please be advised that RX refills may take up to 3 business days. We ask that you follow-up with your pharmacy.

## 2019-09-02 ENCOUNTER — Other Ambulatory Visit: Payer: Self-pay

## 2019-09-02 ENCOUNTER — Encounter: Payer: Self-pay | Admitting: Family Medicine

## 2019-09-02 ENCOUNTER — Ambulatory Visit (INDEPENDENT_AMBULATORY_CARE_PROVIDER_SITE_OTHER): Payer: Medicare HMO | Admitting: Family Medicine

## 2019-09-02 ENCOUNTER — Other Ambulatory Visit: Payer: Self-pay | Admitting: Family Medicine

## 2019-09-02 VITALS — BP 135/81 | HR 111 | Temp 96.9°F | Wt 235.2 lb

## 2019-09-02 DIAGNOSIS — M5416 Radiculopathy, lumbar region: Secondary | ICD-10-CM

## 2019-09-02 DIAGNOSIS — N3000 Acute cystitis without hematuria: Secondary | ICD-10-CM | POA: Diagnosis not present

## 2019-09-02 DIAGNOSIS — E119 Type 2 diabetes mellitus without complications: Secondary | ICD-10-CM

## 2019-09-02 DIAGNOSIS — R350 Frequency of micturition: Secondary | ICD-10-CM

## 2019-09-02 DIAGNOSIS — I10 Essential (primary) hypertension: Secondary | ICD-10-CM

## 2019-09-02 LAB — POCT URINALYSIS DIPSTICK
Bilirubin, UA: NEGATIVE
Glucose, UA: NEGATIVE
Ketones, UA: NEGATIVE
Nitrite, UA: POSITIVE
Protein, UA: POSITIVE — AB
Spec Grav, UA: 1.02 (ref 1.010–1.025)
Urobilinogen, UA: 0.2 E.U./dL
pH, UA: 6 (ref 5.0–8.0)

## 2019-09-02 MED ORDER — CIPROFLOXACIN HCL 250 MG PO TABS: 250.0000 mg | ORAL_TABLET | Freq: Two times a day (BID) | ORAL | 0 refills | Status: DC

## 2019-09-02 NOTE — Telephone Encounter (Signed)
Copied from [place] [phone]. Topic: Quick Communication - Rx Refill/Question >> Sep 02, 2019  6:01 PM Mcneil, Ja-Kwan wrote: Medication: alcohol pads 100 ct, amLODipine (NORVASC) 10 MG tablet, atorvastatin (LIPITOR) 20 MG tablet, metFORMIN (GLUCOPHAGE) 500 MG tablet, metoprolol tartrate (LOPRESSOR) 25 MG tablet, ONETOUCH VERIO test strip, and Lancets (ONETOUCH DELICA PLUS Q000111Q) MISC   Has the patient contacted their pharmacy? yes   Preferred Pharmacy (with phone number or street name): [facility], [place] Phone: [phone]  Fax: [phone]  Agent: Please be advised that RX refills may take up to 3 business days. We ask that you follow-up with your pharmacy.

## 2019-09-02 NOTE — Patient Instructions (Signed)

## 2019-09-02 NOTE — Telephone Encounter (Signed)
Done

## 2019-09-03 MED ORDER — ONETOUCH DELICA PLUS LANCET30G MISC: 1.0000 | Freq: Two times a day (BID) | 3 refills | Status: DC

## 2019-09-03 MED ORDER — METFORMIN HCL 500 MG PO TABS: 500.0000 mg | ORAL_TABLET | Freq: Two times a day (BID) | ORAL | 1 refills | Status: DC

## 2019-09-03 MED ORDER — ATORVASTATIN CALCIUM 20 MG PO TABS: 20.0000 mg | ORAL_TABLET | Freq: Every day | ORAL | 3 refills | Status: DC

## 2019-09-03 MED ORDER — ONETOUCH VERIO VI STRP: ORAL_STRIP | 3 refills | Status: DC

## 2019-09-03 MED ORDER — AMLODIPINE BESYLATE 10 MG PO TABS: 10.0000 mg | ORAL_TABLET | Freq: Every day | ORAL | 1 refills | Status: DC

## 2019-09-03 MED ORDER — METOPROLOL TARTRATE 25 MG PO TABS: 25.0000 mg | ORAL_TABLET | Freq: Two times a day (BID) | ORAL | 1 refills | Status: DC

## 2019-09-05 LAB — URINE CULTURE

## 2019-09-07 ENCOUNTER — Telehealth: Payer: Self-pay

## 2019-09-07 NOTE — Telephone Encounter (Signed)
-----   Message from Margo Common, [place] sent at 09/06/2019  8:38 AM EDT ----- Urine culture isolated Klebsiella bacteria that was sensitive to the Ciprofloxacin given last week. Finish all the antibiotic and drink extra water to help flush out infection. Recheck after finishing the antibiotic if any symptoms remain.

## 2019-09-07 NOTE — Telephone Encounter (Signed)
Patient coming to office for papers, will discuss with her then

## 2019-09-09 NOTE — Telephone Encounter (Signed)
Patient advised and verbalized understanding 

## 2019-09-15 LAB — HM DIABETES EYE EXAM

## 2019-09-20 ENCOUNTER — Other Ambulatory Visit: Payer: Self-pay | Admitting: Family Medicine

## 2019-09-20 DIAGNOSIS — E119 Type 2 diabetes mellitus without complications: Secondary | ICD-10-CM

## 2019-09-20 NOTE — Telephone Encounter (Signed)
Medication Refill - Medication: metformin   Has the patient contacted their pharmacy? Yes.   (Agent: If no, request that the patient contact the pharmacy for the refill.) (Agent: If yes, when and what did the pharmacy advise?)  Preferred Pharmacy (with phone number or street name):  [id number] Emily Stokes [address]  [address] [place] [address]  Phone: [phone] Fax: [phone]  Not a 24 hour pharmacy; exact hours not known.     Agent: Please be advised that RX refills may take up to 3 business days. We ask that you follow-up with your pharmacy.

## 2019-10-21 ENCOUNTER — Other Ambulatory Visit: Payer: Self-pay

## 2019-10-21 ENCOUNTER — Ambulatory Visit
Payer: Medicare (Managed Care) | Attending: Student in an Organized Health Care Education/Training Program | Admitting: Student in an Organized Health Care Education/Training Program

## 2019-10-21 ENCOUNTER — Encounter: Payer: Self-pay | Admitting: Student in an Organized Health Care Education/Training Program

## 2019-10-21 VITALS — BP 125/79 | HR 89 | Temp 97.0°F | Resp 16 | Ht 69.0 in | Wt 238.0 lb

## 2019-10-21 DIAGNOSIS — M5416 Radiculopathy, lumbar region: Secondary | ICD-10-CM

## 2019-10-21 DIAGNOSIS — M47816 Spondylosis without myelopathy or radiculopathy, lumbar region: Secondary | ICD-10-CM | POA: Diagnosis not present

## 2019-10-21 DIAGNOSIS — G894 Chronic pain syndrome: Secondary | ICD-10-CM

## 2019-10-21 DIAGNOSIS — M5136 Other intervertebral disc degeneration, lumbar region: Secondary | ICD-10-CM | POA: Diagnosis not present

## 2019-10-21 MED ORDER — HYDROCODONE-ACETAMINOPHEN 7.5-325 MG PO TABS: 1.0000 | ORAL_TABLET | Freq: Two times a day (BID) | ORAL | 0 refills | Status: AC | PRN

## 2019-10-21 MED ORDER — GABAPENTIN 300 MG PO CAPS: ORAL_CAPSULE | ORAL | 0 refills | Status: DC

## 2019-10-21 NOTE — Progress Notes (Signed)
Safety precautions to be maintained throughout the outpatient stay will include: orient to surroundings, keep bed in low position, maintain call bell within reach at all times, provide assistance with transfer out of bed and ambulation.  

## 2019-10-21 NOTE — Progress Notes (Signed)
PROVIDER NOTE: Information contained herein reflects review and annotations entered in association with encounter. Interpretation of such information and data should be left to medically-trained personnel. Information provided to patient can be located elsewhere in the medical record under "Patient Instructions". Document created using STT-dictation technology, any transcriptional errors that may result from process are unintentional.    Patient: Emily Stokes  Service Category: E/M  Provider: Gillis Santa, MD  DOB: 1961-02-05  DOS: 10/21/2019  Specialty: Interventional Pain Management  MRN: [id number]  Setting: Ambulatory outpatient  PCP: Margo Common, PA  Type: Established Patient    Referring Provider: Margo Common, PA  Location: Office  Delivery: Face-to-face     HPI  Reason for encounter: Emily Stokes, a 59 y.o. year old female, is here today for evaluation and management of her Lumbar radiculopathy [M54.16]. [name]'s primary complain today is Back Pain and Hip Pain (left) Last encounter: Practice (Visit date not found). My last encounter with her was on Visit date not found. Pertinent problems: [name] has Arthralgia of lower leg; Lumbar degenerative disc disease; Lumbar radiculopathy; Chronic pain syndrome; Lumbar facet arthropathy; and Lumbar spondylosis on their pertinent problem list. Pain Assessment: Severity of Chronic pain is reported as a 8 /10. Location: Back Left, Lower/left hip, left thigh. Onset: More than a month ago. Quality: Throbbing. Timing: Constant. Modifying factor(s): nothing. Vitals:  height is '5\' 9"'$  (1.753 m) and weight is 238 lb (108 kg). Her temporal temperature is 97 F (36.1 C) (abnormal). Her blood pressure is 125/79 and her pulse is 89. Her respiration is 16 and oxygen saturation is 98%.   Patient's last visit with me was on November 2019.  Today she presents with worsening low back pain that radiates into her left hip and her left thigh in a  dermatomal distribution.  She states that her pain is gotten worse since her last visit with me.  She finds it harder to perform ADLs.  She would like to discuss going back on her hydrocodone and gabapentin which she did find some benefit in the past.  Patient's previous lumbar MRI was in 2017 and given her worsening radicular pain, recommend repeating so that we can discuss interventional treatment plans.  She has had physical therapy at home in the interim and she states that it was not very helpful.  She tries to walk but is limited by her pain.  ROS  Constitutional: Denies any fever or chills Gastrointestinal: No reported hemesis, hematochezia, vomiting, or acute GI distress Musculoskeletal: Denies any acute onset joint swelling, redness, loss of ROM, or weakness Neurological: No reported episodes of acute onset apraxia, aphasia, dysarthria, agnosia, amnesia, paralysis, loss of coordination, or loss of consciousness  Medication Review  HYDROcodone-acetaminophen, OneTouch Delica Plus [id number], amLODipine, aspirin, atorvastatin, brompheniramine-pseudoephedrine-DM, gabapentin, glucose blood, metFORMIN, and metoprolol tartrate  History Review  Allergy: [name] is allergic to bactrim [sulfamethoxazole-trimethoprim]. Drug: [name]  reports no history of drug use. Alcohol:  reports no history of alcohol use. Tobacco:  reports that she has never smoked. She has never used smokeless tobacco. Social: [name]  reports that she has never smoked. She has never used smokeless tobacco. She reports that she does not drink alcohol and does not use drugs. Medical:  has a past medical history of Abdominal pain, epigastric, Anemia, Backache, Bronchitis, Calculus, kidney, Carpal tunnel syndrome, Chest pain, Chicken pox, Circulatory disease, Disorder of kidney and ureter, Excess, menstruation, Hypercholesteremia, Hypertension, essential, benign, Infected postoperative seroma, Iron  deficiency, Malaise and  fatigue, Measles, MRSA (methicillin resistant staph aureus) culture positive, Nausea alone, Nonspecific abnormal electrocardiogram (ECG) (EKG), and Pain in joint, lower leg. Surgical: [name]  has a past surgical history that includes Colonoscopy (1997); Partial hysterectomy (2009); Cesarean section; Lithotripsy (1997); mrsa; Hernia repair (2012); Abdominal hysterectomy; Hartmann's procedure.; Colostomy takedown (2012); Colonoscopy with propofol (N/A, 01/09/2018); and Colonoscopy with propofol (N/A, 08/20/2019). Family: family history includes Diabetes in her mother and other family members; Hypertension in her father, mother, and other family members; Stroke in her father.  Laboratory Chemistry Profile   Renal Lab Results  Component Value Date   BUN 18 07/28/2019   CREATININE 1.04 (H) 07/28/2019   BCR 17 07/28/2019   GFRAA 68 07/28/2019   GFRNONAA 59 (L) 07/28/2019     Hepatic Lab Results  Component Value Date   AST 17 07/28/2019   ALT 16 07/28/2019   ALBUMIN 4.4 07/28/2019   ALKPHOS 114 07/28/2019   LIPASE 26 07/07/2017     Electrolytes Lab Results  Component Value Date   NA 143 07/28/2019   K 4.1 07/28/2019   CL 107 (H) 07/28/2019   CALCIUM 9.4 07/28/2019     Bone No results found for: VD25OH, VD125OH2TOT, IR4854OE7, OJ5009FG1, 25OHVITD1, 25OHVITD2, 25OHVITD3, TESTOFREE, TESTOSTERONE   Inflammation (CRP: Acute Phase) (ESR: Chronic Phase) No results found for: CRP, ESRSEDRATE, LATICACIDVEN     Note: Above Lab results reviewed.  Recent Imaging Review  DG C-Arm 1-60 Min-No Report Fluoroscopy was utilized by the requesting physician.  No radiographic  interpretation.  Note: Reviewed        Physical Exam  General appearance: Well nourished, well developed, and well hydrated. In no apparent acute distress Mental status: Alert, oriented x 3 (person, place, & time)       Respiratory: No evidence of acute respiratory distress Eyes: PERLA Vitals: BP 125/79   Pulse 89    Temp (!) 97 F (36.1 C) (Temporal)   Resp 16   Ht '5\' 9"'$  (1.753 m)   Wt 238 lb (108 kg)   SpO2 98%   BMI 35.15 kg/m  BMI: Estimated body mass index is 35.15 kg/m as calculated from the following:   Height as of this encounter: '5\' 9"'$  (1.753 m).   Weight as of this encounter: 238 lb (108 kg). Ideal: Ideal body weight: 66.2 kg (145 lb 15.1 oz) Adjusted ideal body weight: 82.9 kg (182 lb 12.3 oz)   Lumbar Spine Area Exam  Skin & Axial Inspection: No masses, redness, or swelling Alignment: Symmetrical Functional ROM: Decreased ROM affecting primarily the left Stability: No instability detected Muscle Tone/Strength: Functionally intact. No obvious neuro-muscular anomalies detected. Sensory (Neurological): Dermatomal pain pattern Palpation: No palpable anomalies       Provocative Tests: Hyperextension/rotation test: (+) bilaterally for facet joint pain. Lumbar quadrant test (Kemp's test): (+) on the left for foraminal stenosis Lateral bending test: (+) ipsilateral radicular pain, on the left. Positive for left-sided foraminal stenosis. Patrick's Maneuver: deferred today                   FABER* test: deferred today                   S-I anterior distraction/compression test: deferred today         S-I lateral compression test: deferred today         S-I Thigh-thrust test: deferred today         S-I Gaenslen's test: deferred today         *(  Flexion, ABduction and External Rotation) Gait & Posture Assessment  Ambulation: Unassisted Gait: Relatively normal for age and body habitus Posture: WNL  Lower Extremity Exam    Side: Right lower extremity  Side: Left lower extremity  Stability: No instability observed          Stability: No instability observed          Skin & Extremity Inspection: Skin color, temperature, and hair growth are WNL. No peripheral edema or cyanosis. No masses, redness, swelling, asymmetry, or associated skin lesions. No contractures.  Skin & Extremity Inspection:  Skin color, temperature, and hair growth are WNL. No peripheral edema or cyanosis. No masses, redness, swelling, asymmetry, or associated skin lesions. No contractures.  Functional ROM: Unrestricted ROM                  Functional ROM: Pain restricted ROM for all joints of the lower extremity          Muscle Tone/Strength: Functionally intact. No obvious neuro-muscular anomalies detected.  Muscle Tone/Strength: Functionally intact. No obvious neuro-muscular anomalies detected.  Sensory (Neurological): Unimpaired        Sensory (Neurological): Dermatomal pain pattern        DTR: Patellar: deferred today Achilles: deferred today Plantar: deferred today  DTR: Patellar: deferred today Achilles: deferred today Plantar: deferred today  Palpation: No palpable anomalies  Palpation: No palpable anomalies    Assessment   Status Diagnosis  Having a Flare-up Persistent Persistent 1. Lumbar radiculopathy   2. Lumbar degenerative disc disease   3. Lumbar facet arthropathy   4. Lumbar spondylosis   5. DDD (degenerative disc disease), lumbar   6. Chronic pain syndrome      Updated Problems: Problem  Lumbar Facet Arthropathy  Lumbar Spondylosis  Chronic Pain Syndrome  Lumbar Degenerative Disc Disease  Lumbar Radiculopathy  Arthralgia of Lower Leg    Plan of Care   Ms. VERNADINE COOMBS has a current medication list which includes the following long-term medication(s): amlodipine, atorvastatin, metformin, metoprolol tartrate, and gabapentin.  Pharmacotherapy (Medications Ordered): Meds ordered this encounter  Medications  . gabapentin (NEURONTIN) 300 MG capsule    Sig: Take 1 capsule (300 mg total) by mouth at bedtime for 30 days, THEN 1 capsule (300 mg total) 2 (two) times daily.    Dispense:  90 capsule    Refill:  0  . HYDROcodone-acetaminophen (NORCO) 7.5-325 MG tablet    Sig: Take 1 tablet by mouth every 12 (twelve) hours as needed for moderate pain. For chronic pain.  To fill on  or after 03/18/2018, 04/16/2018    Dispense:  60 tablet    Refill:  0    Do not place this medication, or any other prescription from our practice, on "Automatic Refill". Patient may have prescription filled one day early if pharmacy is closed on scheduled refill date.   Orders:  Orders Placed This Encounter  Procedures  . MR LUMBAR SPINE WO CONTRAST    Patient presents with axial pain with possible radicular component.  In addition to any acute findings, please report on:  1. Facet (Zygapophyseal) joint DJD (Hypertrophy, space narrowing, subchondral sclerosis, and/or osteophyte formation) 2. DDD and/or IVDD (Loss of disc height, desiccation or "Black disc disease") 3. Pars defects 4. Spondylolisthesis, spondylosis, and/or spondyloarthropathies (include Degree/Grade of displacement in mm) 5. Vertebral body Fractures, including age (old, new/acute) 36. Modic Type Changes 7. Demineralization 8. Bone pathology 9. Central, Lateral Recess, and/or Foraminal Stenosis (include AP diameter of stenosis  in mm) 10. Surgical changes (hardware type, status, and presence of fibrosis)  NOTE: Please specify level(s) and laterality.    Standing Status:   Future    Standing Expiration Date:   01/21/2020    Order Specific Question:   What is the patient's sedation requirement?    Answer:   No Sedation    Order Specific Question:   Does the patient have a pacemaker or implanted devices?    Answer:   No    Order Specific Question:   Preferred imaging location?    Answer:   [facility] (table limit-350lbs)    Order Specific Question:   Call Results- Best Contact Number?    Answer:   [phone] ([facility])    Order Specific Question:   Radiology Contrast Protocol - do NOT remove file path    Answer:   \\[email or web address].PDF  . ToxASSURE Select 13 (MW), Urine    Volume: 30 ml(s). Minimum 3 ml of urine is needed. Document temperature of fresh sample. Indications:  Long term (current) use of opiate analgesic (O72.550)   Follow-up plan:   Return in about 6 weeks (around 12/02/2019) for Medication Management, in person.   Recent Visits No visits were found meeting these conditions. Showing recent visits within past 90 days and meeting all other requirements Today's Visits Date Type Provider Dept  10/21/19 Office Visit Gillis Santa, MD [facility]  Showing today's visits and meeting all other requirements Future Appointments Date Type Provider Dept  11/25/19 Appointment Gillis Santa, MD [facility]  Showing future appointments within next 90 days and meeting all other requirements  I discussed the assessment and treatment plan with the patient. The patient was provided an opportunity to ask questions and all were answered. The patient agreed with the plan and demonstrated an understanding of the instructions.  Patient advised to call back or seek an in-person evaluation if the symptoms or condition worsens.  Duration of encounter: 30 minutes.  Note by: Gillis Santa, MD Date: 10/21/2019; Time: 11:43 AM

## 2019-10-21 NOTE — Patient Instructions (Signed)
Prescriptions for Hydrocodone and Gabapentin have been sent to your pharmacy.  An MRI of your lower back has been ordered.

## 2019-10-26 ENCOUNTER — Other Ambulatory Visit: Payer: Self-pay | Admitting: Family Medicine

## 2019-10-26 DIAGNOSIS — E119 Type 2 diabetes mellitus without complications: Secondary | ICD-10-CM

## 2019-10-26 LAB — TOXASSURE SELECT 13 (MW), URINE

## 2019-10-26 NOTE — Telephone Encounter (Signed)
Requested Prescriptions  Pending Prescriptions Disp Refills  . glucose blood (ONETOUCH VERIO) test strip [Pharmacy Med Name: ONE TOUCH VERIO TEST STRIP] 100 strip 4    Sig: TEST FASTING BLOOD SUGAR BEFORE BREAKFAST DAILY. RECHECK BLOOD SUGAR IF ANY SYMPTOMS OF HYPOGLYCEMIA     Endocrinology: Diabetes - Testing Supplies Passed - 10/26/2019  1:30 AM      Passed - Valid encounter within last 12 months    Recent Outpatient Visits          1 month ago Acute cystitis without hematuria   [place]   3 months ago Type 2 diabetes mellitus without complication, without long-term current use of insulin ([place])   [place], [place], [place]   6 months ago Urinary tract infection without hematuria, site unspecified   [name], [place] [address], [place]   10 months ago Urinary tract infection without hematuria, site unspecified   [name], [place] [address], [place]   11 months ago Acute cystitis without hematuria   [name], [name], [place]      Future Appointments            In 2 days [place], [name], [place], [place]

## 2019-10-27 NOTE — Progress Notes (Signed)
     Established patient visit   Patient: Emily Stokes   DOB: 1960/11/21   59 y.o. Female  MRN: [id number] Visit Date: 10/28/2019      1. Erroneous encounter - disregard Patient left without being seen. Charting started prior to visit.  [place]

## 2019-10-28 ENCOUNTER — Encounter: Payer: Medicare HMO | Admitting: Family Medicine

## 2019-10-28 ENCOUNTER — Other Ambulatory Visit: Payer: Self-pay

## 2019-10-29 ENCOUNTER — Other Ambulatory Visit: Payer: Self-pay | Admitting: Family Medicine

## 2019-10-29 ENCOUNTER — Telehealth: Payer: Self-pay | Admitting: Family Medicine

## 2019-10-29 DIAGNOSIS — E119 Type 2 diabetes mellitus without complications: Secondary | ICD-10-CM

## 2019-10-29 NOTE — Telephone Encounter (Signed)
Phone call to pt.  Left vm to call office re: refill on Metformin.  ([facility] sent refill request for Metformin.  Metformin refill was sent to [facility] on 09/03/19; #180 with RF x 1)    Noted that refill was sent to [facility] on [address] [place], by PCP, after vm was left for pt. to call back re: pharmacy preference.

## 2019-10-29 NOTE — Telephone Encounter (Signed)
Left message for patient to call back and schedule Medicare Annual Wellness Visit (AWV) either virtually or in office.  Last AWVs 11/04/2018  ; please schedule at anytime with [facility] Health Advisor.

## 2019-11-08 ENCOUNTER — Other Ambulatory Visit: Payer: Self-pay | Admitting: Family Medicine

## 2019-11-08 DIAGNOSIS — I10 Essential (primary) hypertension: Secondary | ICD-10-CM

## 2019-11-08 DIAGNOSIS — E119 Type 2 diabetes mellitus without complications: Secondary | ICD-10-CM

## 2019-11-08 MED ORDER — METFORMIN HCL 500 MG PO TABS: 1000.0000 mg | ORAL_TABLET | Freq: Two times a day (BID) | ORAL | 1 refills | Status: DC

## 2019-11-08 NOTE — Telephone Encounter (Signed)
Patient returned call stating that she has run out of Metformin. She states that at last ov she was told to take 2 tabs in the am and 2 tabs in pm. She states she is now out of medication. Her BS have run 154-190 after dose change.  She is requesting a bridge dose [facility] for the remainder.  Will route to office fo Sig will need changed.

## 2019-11-25 ENCOUNTER — Encounter: Payer: Medicare (Managed Care) | Admitting: Student in an Organized Health Care Education/Training Program

## 2019-12-24 ENCOUNTER — Other Ambulatory Visit: Payer: Self-pay | Admitting: Student in an Organized Health Care Education/Training Program

## 2019-12-24 DIAGNOSIS — M5416 Radiculopathy, lumbar region: Secondary | ICD-10-CM

## 2019-12-24 DIAGNOSIS — M47816 Spondylosis without myelopathy or radiculopathy, lumbar region: Secondary | ICD-10-CM

## 2019-12-24 DIAGNOSIS — G894 Chronic pain syndrome: Secondary | ICD-10-CM

## 2019-12-29 ENCOUNTER — Telehealth: Payer: Self-pay | Admitting: Family Medicine

## 2019-12-29 NOTE — Telephone Encounter (Signed)
Copied from [place] [id number]. Topic: Medicare AWV >> Dec 29, 2019 11:34 AM Cher Nakai R wrote: Reason for CRM:  Left message for patient to call back and schedule Medicare Annual Wellness Visit (AWV) either virtually or in office.  Last AWV 11/04/2018  Please schedule at anytime with [facility] Health Advisor.  If any questions, please contact me at [phone]

## 2019-12-30 ENCOUNTER — Other Ambulatory Visit: Payer: Self-pay | Admitting: Family Medicine

## 2019-12-30 DIAGNOSIS — Z1231 Encounter for screening mammogram for malignant neoplasm of breast: Secondary | ICD-10-CM

## 2020-01-24 ENCOUNTER — Ambulatory Visit: Payer: Medicare (Managed Care) | Admitting: Family Medicine

## 2020-02-11 ENCOUNTER — Ambulatory Visit (INDEPENDENT_AMBULATORY_CARE_PROVIDER_SITE_OTHER): Payer: Medicare Other | Admitting: Family Medicine

## 2020-02-11 ENCOUNTER — Other Ambulatory Visit: Payer: Self-pay

## 2020-02-11 ENCOUNTER — Encounter: Payer: Self-pay | Admitting: Family Medicine

## 2020-02-11 VITALS — BP 131/82 | HR 93 | Temp 98.2°F | Resp 16 | Wt 232.6 lb

## 2020-02-11 DIAGNOSIS — Z23 Encounter for immunization: Secondary | ICD-10-CM

## 2020-02-11 DIAGNOSIS — H01001 Unspecified blepharitis right upper eyelid: Secondary | ICD-10-CM

## 2020-02-11 DIAGNOSIS — R3915 Urgency of urination: Secondary | ICD-10-CM

## 2020-02-11 DIAGNOSIS — H01004 Unspecified blepharitis left upper eyelid: Secondary | ICD-10-CM

## 2020-02-11 MED ORDER — CEFUROXIME AXETIL 250 MG PO TABS: 250.0000 mg | ORAL_TABLET | Freq: Two times a day (BID) | ORAL | 0 refills | Status: DC

## 2020-02-11 MED ORDER — ERYTHROMYCIN 5 MG/GM OP OINT: 1.0000 "application " | TOPICAL_OINTMENT | Freq: Every day | OPHTHALMIC | 0 refills | Status: DC

## 2020-02-11 NOTE — Progress Notes (Signed)
Established patient visit   Patient: Emily Stokes   DOB: April 09, 1961   59 y.o. Female  MRN: [id number] Visit Date: 02/11/2020  Today's healthcare provider: [name], PA   Chief Complaint  Patient presents with   Urinary Urgency   Subjective    Urinary Frequency  This is a new problem. The current episode started 1 to 4 weeks ago. The problem occurs every urination. The patient is experiencing no pain. There has been no fever. Associated symptoms include frequency and urgency. Pertinent negatives include no chills, discharge, flank pain, hematuria, hesitancy, nausea, possible pregnancy, sweats or vomiting. She has tried nothing for the symptoms.     Past Medical History:  Diagnosis Date   Abdominal pain, epigastric    Anemia    Backache    Bronchitis    Calculus, kidney    Carpal tunnel syndrome    Chest pain    Chicken pox    Circulatory disease    Disorder of kidney and ureter    Excess, menstruation    Hypercholesteremia    Hypertension, essential, benign    Infected postoperative seroma    Iron deficiency    Malaise and fatigue    Measles    MRSA (methicillin resistant staph aureus) culture positive    Nausea alone    Nonspecific abnormal electrocardiogram (ECG) (EKG)    Pain in joint, lower leg    Past Surgical History:  Procedure Laterality Date   ABDOMINAL HYSTERECTOMY     CESAREAN SECTION     COLONOSCOPY  1997   COLONOSCOPY WITH PROPOFOL N/A 01/09/2018   Procedure: COLONOSCOPY WITH PROPOFOL;  Surgeon: Virgel Manifold, MD;  Location: [facility] ENDOSCOPY;  Service: Endoscopy;  Laterality: N/A;   COLONOSCOPY WITH PROPOFOL N/A 08/20/2019   Procedure: COLONOSCOPY WITH PROPOFOL;  Surgeon: Lucilla Lame, MD;  Location: [facility] ENDOSCOPY;  Service: Endoscopy;  Laterality: N/A;   COLOSTOMY TAKEDOWN  2012   Hartmann's procedure.     HERNIA REPAIR  [id number]   umbilical   LITHOTRIPSY  [id number]   with complications, hospitalized due to these complications for 3 months    mrsa     removal on nose   PARTIAL HYSTERECTOMY  2009    vaginal hysterectomy, has both ovaries   Social History   Tobacco Use   Smoking status: Never Smoker   Smokeless tobacco: Never Used  Vaping Use   Vaping Use: Never used  Substance Use Topics   Alcohol use: No    Alcohol/week: 0.0 standard drinks   Drug use: No   Family Status  Relation Name Status   Father  (Not Specified)   Mother  (Not Specified)   Other  (Not Specified)   Other  (Not Specified)   Other  (Not Specified)   Other  (Not Specified)   Other  (Not Specified)   Other  (Not Specified)   Allergies  Allergen Reactions   Bactrim [Sulfamethoxazole-Trimethoprim] Swelling       Medications: Outpatient Medications Prior to Visit  Medication Sig   amLODipine (NORVASC) 10 MG tablet Take 1 tablet (10 mg total) by mouth daily.   aspirin 81 MG tablet Take 81 mg by mouth daily.   atorvastatin (LIPITOR) 20 MG tablet Take 1 tablet (20 mg total) by mouth daily.   glucose blood (ONETOUCH VERIO) test strip TEST FASTING BLOOD SUGAR BEFORE BREAKFAST DAILY. RECHECK BLOOD SUGAR IF ANY SYMPTOMS OF HYPOGLYCEMIA   Lancets (ONETOUCH DELICA PLUS [id number]) [place] 1 each by Does  not apply route in the morning and at bedtime.   metFORMIN (GLUCOPHAGE) 500 MG tablet Take 2 tablets (1,000 mg total) by mouth 2 (two) times daily with a meal.   metoprolol tartrate (LOPRESSOR) 25 MG tablet Take 1 tablet (25 mg total) by mouth 2 (two) times daily.   brompheniramine-pseudoephedrine-DM 30-2-10 MG/5ML syrup Take 5 mLs by mouth 4 (four) times daily as needed. (Patient not taking: Reported on 02/11/2020)   gabapentin (NEURONTIN) 300 MG capsule Take 1 capsule (300 mg total) by mouth at bedtime for 30 days, THEN 1 capsule (300 mg total) 2 (two) times daily.   No facility-administered medications prior to visit.    Review of Systems  Constitutional: Negative for chills.  Gastrointestinal: Negative for nausea and vomiting.  Genitourinary:  Positive for frequency and urgency. Negative for flank pain, hematuria and hesitancy.      Objective    BP 131/82   Pulse 93   Temp 98.2 F (36.8 C) (Oral)   Resp 16   Wt 232 lb 9.6 oz (105.5 kg)   SpO2 100%   BMI 34.35 kg/m    Physical Exam Constitutional:      General: She is not in acute distress.    Appearance: She is well-developed.  HENT:     Head: Normocephalic and atraumatic.     Right Ear: Hearing normal.     Left Ear: Hearing normal.     Nose: Nose normal.  Eyes:     General: Lids are normal. No scleral icterus.       Right eye: No discharge.        Left eye: No discharge.     Conjunctiva/sclera: Conjunctivae normal.  Cardiovascular:     Rate and Rhythm: Normal rate and regular rhythm.  Pulmonary:     Effort: Pulmonary effort is normal. No respiratory distress.  Musculoskeletal:        General: Normal range of motion.  Skin:    Findings: No lesion or rash.  Neurological:     Mental Status: She is alert and oriented to person, place, and time.  Psychiatric:        Speech: Speech normal.        Behavior: Behavior normal.        Thought Content: Thought content normal.       No results found for any visits on 02/11/20.  Assessment & Plan     1. Urinary urgency Developed urinary frequency and urgency the past week or two. No hematuria or fever. Symptoms similar to the last acute cystitis she had in April 2021. Will treat with Ceftin and increase in water intake. Recommend getting urine culture if another specimen can be collected (QNS). - POCT urinalysis dipstick  2. Need for influenza vaccination - Flu Vaccine QUAD 36+ mos IM  3. Blepharitis of upper eyelids of both eyes, unspecified type Some redness of upper eyelids bilaterally. No drainage or photosensitivity. Denies itching. Treat with Erythromycin Ophthalmic Ointment 3.5 gm tube apply 1/4-1/5 inch ointment inside of lower lid at bedtime. May apply warm compresses prn. Recheck if no better in  5-7 days.   No follow-ups on file.      I, Monnie Anspach, PA-C, have reviewed all documentation for this visit. The documentation on 01/29/21 for the exam, diagnosis, procedures, and orders are all accurate and complete.    [name], [place] [phone] (phone) [phone] (fax)  [place]

## 2020-02-29 ENCOUNTER — Telehealth: Payer: Self-pay | Admitting: Student in an Organized Health Care Education/Training Program

## 2020-02-29 DIAGNOSIS — M5416 Radiculopathy, lumbar region: Secondary | ICD-10-CM

## 2020-02-29 DIAGNOSIS — M47816 Spondylosis without myelopathy or radiculopathy, lumbar region: Secondary | ICD-10-CM

## 2020-02-29 DIAGNOSIS — G894 Chronic pain syndrome: Secondary | ICD-10-CM

## 2020-02-29 DIAGNOSIS — M5136 Other intervertebral disc degeneration, lumbar region: Secondary | ICD-10-CM

## 2020-02-29 NOTE — Telephone Encounter (Signed)
Patient lvmail stating she now has her insurance changed and is able to come back to the clinic. She needs to have MRI done but will need a new order put in as the old one has expired. Please have Dr. Holley Raring to put in new order for MRI and we will get this take care of so patient can get back in the clinic. Thank you

## 2020-03-08 ENCOUNTER — Telehealth: Payer: Self-pay

## 2020-03-08 NOTE — Telephone Encounter (Signed)
Copied from [place] [id number]. Topic: General - Other >> Mar 08, 2020 12:45 PM Emily Stokes A wrote: Patient called to inform Chrismon that she finished uti medication however she is still having symptoms .Please advise

## 2020-03-09 ENCOUNTER — Other Ambulatory Visit (INDEPENDENT_AMBULATORY_CARE_PROVIDER_SITE_OTHER): Payer: Medicare Other

## 2020-03-09 DIAGNOSIS — N390 Urinary tract infection, site not specified: Secondary | ICD-10-CM

## 2020-03-09 NOTE — Telephone Encounter (Signed)
Patient has been notified and orders have been placed.

## 2020-03-09 NOTE — Telephone Encounter (Signed)
The treatment for a UTI was 4 weeks ago and we should recheck urinalysis with urine C&S. If no sign of infection, will probably need an urology referral.

## 2020-03-13 ENCOUNTER — Other Ambulatory Visit: Payer: Self-pay | Admitting: Family Medicine

## 2020-03-13 DIAGNOSIS — I10 Essential (primary) hypertension: Secondary | ICD-10-CM

## 2020-03-16 ENCOUNTER — Encounter: Payer: Self-pay | Admitting: Family Medicine

## 2020-03-16 ENCOUNTER — Other Ambulatory Visit: Payer: Self-pay

## 2020-03-16 ENCOUNTER — Ambulatory Visit (INDEPENDENT_AMBULATORY_CARE_PROVIDER_SITE_OTHER): Payer: Medicare Other | Admitting: Family Medicine

## 2020-03-16 DIAGNOSIS — J40 Bronchitis, not specified as acute or chronic: Secondary | ICD-10-CM | POA: Diagnosis not present

## 2020-03-16 MED ORDER — BENZONATATE 100 MG PO CAPS: 100.0000 mg | ORAL_CAPSULE | Freq: Three times a day (TID) | ORAL | 0 refills | Status: DC | PRN

## 2020-03-16 MED ORDER — AZITHROMYCIN 250 MG PO TABS: ORAL_TABLET | ORAL | 0 refills | Status: DC

## 2020-03-16 NOTE — Progress Notes (Signed)
MyChart Video Visit    Virtual Visit via Video Note   This visit type was conducted due to national recommendations for restrictions regarding the COVID-19 Pandemic (e.g. social distancing) in an effort to limit this patient's exposure and mitigate transmission in our community. This patient is at least at moderate risk for complications without adequate follow up. This format is felt to be most appropriate for this patient at this time. Physical exam was limited by quality of the video and audio technology used for the visit.   Patient location: Home Provider location: Office  I discussed the limitations of evaluation and management by telemedicine and the availability of in person appointments. The patient expressed understanding and agreed to proceed.  Patient: Emily Stokes   DOB: 12-26-1960   59 y.o. Female  MRN: [id number] Visit Date: 03/16/2020  Today's healthcare provider: [name], PA   Chief Complaint  Patient presents with   Cough   Subjective    Cough This is a new problem. Episode onset: 4 days ago. The problem has been unchanged. The cough is productive of sputum (green colored). Pertinent negatives include no chest pain, chills, ear congestion, ear pain, fever, headaches, hemoptysis, nasal congestion, postnasal drip, rhinorrhea, sore throat, shortness of breath, sweats or wheezing. Treatments tried: NyQuil. The treatment provided mild relief.   Cough is worse at night.     Past Medical History:  Diagnosis Date   Abdominal pain, epigastric    Anemia    Backache    Bronchitis    Calculus, kidney    Carpal tunnel syndrome    Chest pain    Chicken pox    Circulatory disease    Disorder of kidney and ureter    Excess, menstruation    Hypercholesteremia    Hypertension, essential, benign    Infected postoperative seroma    Iron deficiency    Malaise and fatigue    Measles    MRSA (methicillin resistant staph aureus) culture  positive    Nausea alone    Nonspecific abnormal electrocardiogram (ECG) (EKG)    Pain in joint, lower leg    Past Surgical History:  Procedure Laterality Date   ABDOMINAL HYSTERECTOMY     CESAREAN SECTION     COLONOSCOPY  1997   COLONOSCOPY WITH PROPOFOL N/A 01/09/2018   Procedure: COLONOSCOPY WITH PROPOFOL;  Surgeon: Virgel Manifold, MD;  Location: [facility] ENDOSCOPY;  Service: Endoscopy;  Laterality: N/A;   COLONOSCOPY WITH PROPOFOL N/A 08/20/2019   Procedure: COLONOSCOPY WITH PROPOFOL;  Surgeon: Lucilla Lame, MD;  Location: [facility] ENDOSCOPY;  Service: Endoscopy;  Laterality: N/A;   COLOSTOMY TAKEDOWN  2012   Hartmann's procedure.     HERNIA REPAIR  [id number]   umbilical   LITHOTRIPSY  [id number]   with complications, hospitalized due to these complications for 3 months   mrsa     removal on nose   PARTIAL HYSTERECTOMY  2009    vaginal hysterectomy, has both ovaries   Family History  Problem Relation Age of Onset   Hypertension Father    Stroke Father    Diabetes Mother    Hypertension Mother    Diabetes Other        sibling   Diabetes Other        sibling   Diabetes Other        sibling   Hypertension Other        sibling   Hypertension Other        sibling  Hypertension Other        sibling   Social History   Tobacco Use   Smoking status: Never Smoker   Smokeless tobacco: Never Used  Vaping Use   Vaping Use: Never used  Substance Use Topics   Alcohol use: No    Alcohol/week: 0.0 standard drinks   Drug use: No   Allergies  Allergen Reactions   Bactrim [Sulfamethoxazole-Trimethoprim] Swelling    Medications: Outpatient Medications Prior to Visit  Medication Sig   amLODipine (NORVASC) 10 MG tablet Take 1 tablet (10 mg total) by mouth daily.   aspirin 81 MG tablet Take 81 mg by mouth daily.   atorvastatin (LIPITOR) 20 MG tablet Take 1 tablet (20 mg total) by mouth daily.   glucose blood (ONETOUCH VERIO) test strip TEST FASTING  BLOOD SUGAR BEFORE BREAKFAST DAILY. RECHECK BLOOD SUGAR IF ANY SYMPTOMS OF HYPOGLYCEMIA   Lancets (ONETOUCH DELICA PLUS [id number]) MISC USE AS DIRECTED TWICE DAILY IN THE MORNING AND AT BEDTIME   metFORMIN (GLUCOPHAGE) 500 MG tablet Take 2 tablets (1,000 mg total) by mouth 2 (two) times daily with a meal.   metoprolol tartrate (LOPRESSOR) 25 MG tablet TAKE ONE (1) TABLET BY MOUTH TWICE DAILY   gabapentin (NEURONTIN) 300 MG capsule Take 1 capsule (300 mg total) by mouth at bedtime for 30 days, THEN 1 capsule (300 mg total) 2 (two) times daily.   [DISCONTINUED] brompheniramine-pseudoephedrine-DM 30-2-10 MG/5ML syrup Take 5 mLs by mouth 4 (four) times daily as needed. (Patient not taking: Reported on 02/11/2020)   [DISCONTINUED] cefUROXime (CEFTIN) 250 MG tablet Take 1 tablet (250 mg total) by mouth 2 (two) times daily with a meal.   [DISCONTINUED] erythromycin ophthalmic ointment Place 1 application into both eyes at bedtime. (Patient not taking: Reported on 03/16/2020)   No facility-administered medications prior to visit.    Review of Systems  Constitutional: Negative for appetite change, chills, fatigue and fever.  HENT: Positive for trouble swallowing and voice change. Negative for ear pain, postnasal drip, rhinorrhea and sore throat.   Respiratory: Positive for cough. Negative for hemoptysis, chest tightness, shortness of breath and wheezing.   Cardiovascular: Negative for chest pain and palpitations.  Gastrointestinal: Negative for abdominal pain, nausea and vomiting.  Neurological: Negative for dizziness, weakness and headaches.       Objective    There were no vitals taken for this visit.    Physical Exam: WDWN female in no apparent distress.  Head: Normocephalic, atraumatic. Neck: Supple, NROM Respiratory: No apparent distress. Congested cough during interview without wheeze or dyspnea. Psych: Normal mood and affect     Assessment & Plan     1. Bronchitis Developed  cough with greenish sputum production over the past 3-4 days. No fever, sore throat, chest pain, wheeze, new loss of taste or body aches. Some slight sneeze and mild relief from Nyquil. Two grand children have had a similar cough with negative COVID tests. This patient had COVID-30 January 2019 and has had both vaccinations 08-05-19 and 08-31-19. Increase fluid intake and add Z-pak with Benzonatate. Monitor for fever and may need COVID test if no improvement in a couple days. Maintain COVID restrictions. - benzonatate (TESSALON) 100 MG capsule; Take 1 capsule (100 mg total) by mouth 3 (three) times daily as needed for cough.  Dispense: 21 capsule; Refill: 0 - azithromycin (ZITHROMAX) 250 MG tablet; Take 2 tablets by mouth today then 1 daily for 4 days.  Dispense: 6 tablet; Refill: 0   No follow-ups on file.  I discussed the assessment and treatment plan with the patient. The patient was provided an opportunity to ask questions and all were answered. The patient agreed with the plan and demonstrated an understanding of the instructions.   The patient was advised to call back or seek an in-person evaluation if the symptoms worsen or if the condition fails to improve as anticipated.  I provided 20 minutes of non-face-to-face time during this encounter.  Andres Shad, PA, have reviewed all documentation for this visit. The documentation on 03/16/20 for the exam, diagnosis, procedures, and orders are all accurate and complete.   [name], [place] [phone] (phone) [phone] (fax)  [place]

## 2020-03-22 ENCOUNTER — Other Ambulatory Visit: Payer: Self-pay | Admitting: Family Medicine

## 2020-03-22 DIAGNOSIS — E119 Type 2 diabetes mellitus without complications: Secondary | ICD-10-CM

## 2020-03-23 ENCOUNTER — Ambulatory Visit
Admission: RE | Admit: 2020-03-23 | Discharge: 2020-03-23 | Disposition: A | Discharge status: Home / Self Care | Payer: Medicare Other | Source: Ambulatory Visit | Attending: Student in an Organized Health Care Education/Training Program | Admitting: Student in an Organized Health Care Education/Training Program

## 2020-03-23 ENCOUNTER — Other Ambulatory Visit: Payer: Self-pay

## 2020-03-23 DIAGNOSIS — G894 Chronic pain syndrome: Secondary | ICD-10-CM | POA: Insufficient documentation

## 2020-03-23 DIAGNOSIS — M47816 Spondylosis without myelopathy or radiculopathy, lumbar region: Secondary | ICD-10-CM | POA: Diagnosis present

## 2020-03-23 DIAGNOSIS — M5416 Radiculopathy, lumbar region: Secondary | ICD-10-CM | POA: Diagnosis not present

## 2020-03-23 DIAGNOSIS — M5136 Other intervertebral disc degeneration, lumbar region: Secondary | ICD-10-CM | POA: Diagnosis present

## 2020-04-27 ENCOUNTER — Other Ambulatory Visit: Payer: Self-pay

## 2020-04-27 ENCOUNTER — Encounter: Payer: Self-pay | Admitting: Student in an Organized Health Care Education/Training Program

## 2020-04-27 ENCOUNTER — Ambulatory Visit
Payer: Medicare Other | Attending: Student in an Organized Health Care Education/Training Program | Admitting: Student in an Organized Health Care Education/Training Program

## 2020-04-27 VITALS — BP 136/80 | HR 83 | Temp 97.4°F | Resp 16 | Ht 69.0 in | Wt 238.0 lb

## 2020-04-27 DIAGNOSIS — M48062 Spinal stenosis, lumbar region with neurogenic claudication: Secondary | ICD-10-CM

## 2020-04-27 DIAGNOSIS — G894 Chronic pain syndrome: Secondary | ICD-10-CM

## 2020-04-27 DIAGNOSIS — M5416 Radiculopathy, lumbar region: Secondary | ICD-10-CM

## 2020-04-27 DIAGNOSIS — M47816 Spondylosis without myelopathy or radiculopathy, lumbar region: Secondary | ICD-10-CM | POA: Diagnosis present

## 2020-04-27 MED ORDER — TIZANIDINE HCL 4 MG PO TABS: 4.0000 mg | ORAL_TABLET | Freq: Every evening | ORAL | 2 refills | Status: DC | PRN

## 2020-04-27 MED ORDER — GABAPENTIN 300 MG PO CAPS: 300.0000 mg | ORAL_CAPSULE | Freq: Two times a day (BID) | ORAL | 0 refills | Status: DC

## 2020-04-27 NOTE — Progress Notes (Signed)
PROVIDER NOTE: Information contained herein reflects review and annotations entered in association with encounter. Interpretation of such information and data should be left to medically-trained personnel. Information provided to patient can be located elsewhere in the medical record under "Patient Instructions". Document created using STT-dictation technology, any transcriptional errors that may result from process are unintentional.    Patient: Emily Stokes  Service Category: E/M  Provider: Gillis Santa, MD  DOB: 04-May-1961  DOS: 04/27/2020  Specialty: Interventional Pain Management  MRN: [id number]  Setting: Ambulatory outpatient  PCP: Emily Common, PA-C  Type: Established Patient    Referring Provider: Margo Common, PA-C  Location: Office  Delivery: Face-to-face     HPI  [name], a 59 y.o. year old female, is here today because of her Lumbar radiculopathy [M54.16]. [name] primary complain today is Back Pain (lower) Last encounter: My last encounter with her was on 02/29/2020. Pertinent problems: [name] has Arthralgia of lower leg; Lumbar degenerative disc disease; Lumbar radiculopathy; Chronic pain syndrome; Lumbar facet arthropathy; and Lumbar spondylosis on their pertinent problem list. Pain Assessment: Severity of Chronic pain is reported as a 8 /10. Location: Back Lower/to left thigh. Onset: More than a month ago. Quality: Aching. Timing: Constant. Modifying factor(s): nothing is helping; hydrocodone did help when she had it. Vitals:  height is '5\' 9"'  (1.753 m) and weight is 238 lb (108 kg). Her temporal temperature is 97.4 F (36.3 C) (abnormal). Her blood pressure is 136/80 and her pulse is 83. Her respiration is 16 and oxygen saturation is 98%.   Reason for encounter: worsening of previously known (established) problem   Presents today with increased low back pain with radiation to her lateral hip and down her medial thigh/groin in a dermatomal  fashion MRI completed, results below (reviewed with patient see A&P) Is sad today as her 66 year old nephew passed away from heart attack (family hx of CAD) Denies any bowel/bladder issues, no LE weakness  ROS  Constitutional: Denies any fever or chills Gastrointestinal: No reported hemesis, hematochezia, vomiting, or acute GI distress Musculoskeletal: low back pain + left leg pain Neurological: No reported episodes of acute onset apraxia, aphasia, dysarthria, agnosia, amnesia, paralysis, loss of coordination, or loss of consciousness  Medication Review  Ibuprofen-diphenhydrAMINE Cit, OneTouch Delica Plus [id number], acetaminophen, amLODipine, aspirin, atorvastatin, benzonatate, gabapentin, glucose blood, metFORMIN, metoprolol tartrate, and tiZANidine  History Review  Allergy: [name] is allergic to bactrim [sulfamethoxazole-trimethoprim]. Drug: [name]  reports no history of drug use. Alcohol:  reports no history of alcohol use. Tobacco:  reports that she has never smoked. She has never used smokeless tobacco. Social: [name]  reports that she has never smoked. She has never used smokeless tobacco. She reports that she does not drink alcohol and does not use drugs. Medical:  has a past medical history of Abdominal pain, epigastric, Anemia, Backache, Bronchitis, Calculus, kidney, Carpal tunnel syndrome, Chest pain, Chicken pox, Circulatory disease, Disorder of kidney and ureter, Excess, menstruation, Hypercholesteremia, Hypertension, essential, benign, Infected postoperative seroma, Iron deficiency, Malaise and fatigue, Measles, MRSA (methicillin resistant staph aureus) culture positive, Nausea alone, Nonspecific abnormal electrocardiogram (ECG) (EKG), and Pain in joint, lower leg. Surgical: [name]  has a past surgical history that includes Colonoscopy (1997); Partial hysterectomy (2009); Cesarean section; Lithotripsy (1997); mrsa; Hernia repair (2012); Abdominal hysterectomy;  Hartmann's procedure.; Colostomy takedown (2012); Colonoscopy with propofol (N/A, 01/09/2018); and Colonoscopy with propofol (N/A, 08/20/2019). Family: family history includes Diabetes in her mother and  other family members; Hypertension in her father, mother, and other family members; Stroke in her father.  Laboratory Chemistry Profile   Renal Lab Results  Component Value Date   BUN 18 07/28/2019   CREATININE 1.04 (H) 07/28/2019   BCR 17 07/28/2019   GFRAA 68 07/28/2019   GFRNONAA 59 (L) 07/28/2019     Hepatic Lab Results  Component Value Date   AST 17 07/28/2019   ALT 16 07/28/2019   ALBUMIN 4.4 07/28/2019   ALKPHOS 114 07/28/2019   LIPASE 26 07/07/2017     Electrolytes Lab Results  Component Value Date   NA 143 07/28/2019   K 4.1 07/28/2019   CL 107 (H) 07/28/2019   CALCIUM 9.4 07/28/2019     Bone No results found for: VD25OH, VD125OH2TOT, [id number], DH7416LA4, 25OHVITD1, 25OHVITD2, 25OHVITD3, TESTOFREE, TESTOSTERONE   Inflammation (CRP: Acute Phase) (ESR: Chronic Phase) No results found for: CRP, ESRSEDRATE, LATICACIDVEN     Note: Above Lab results reviewed.  Recent Imaging Review  MR LUMBAR SPINE WO CONTRAST CLINICAL DATA:  Osteoarthritis, lumbosacral. Low back pain. Lumbar radiculopathy.  EXAM: MRI LUMBAR SPINE WITHOUT CONTRAST  TECHNIQUE: Multiplanar, multisequence MR imaging of the lumbar spine was performed. No intravenous contrast was administered.  COMPARISON:  MRI of the lumbar spine July 13, 2015.  FINDINGS: Segmentation:  Standard.  Alignment: Grade 1 anterolisthesis of L3 over L4, progressed from prior MRI. Minimal anterolisthesis of L4 over L5.  Vertebrae:  No fracture, evidence of discitis, or bone lesion.  Conus medullaris and cauda equina: Conus extends to the L1 level. Conus and cauda equina appear normal.  Paraspinal and other soft tissues: Left renal cyst.  Disc levels:  T12-L1: No spinal canal or neural foraminal  stenosis.  L1-2: No spinal canal or neural foraminal stenosis.  L2-3: Shallow disc bulge, facet degenerative changes and ligamentum flavum redundancy without significant spinal canal or neural foraminal stenosis.  L3-4: Disc bulge/disc uncovering, prominent hypertrophic facet degenerative changes and ligamentum flavum redundancy resulting in mild spinal canal stenosis with narrowing of the bilateral subarticular zones, mild right and moderate left neural foraminal narrowing.  L4-5: Disc bulge, prominent facet degenerative changes without significant spinal canal or neural foraminal stenosis.  L5-S1: Left asymmetric disc bulge with associated osteophytic component and facet degenerative changes resulting in narrowing of the left subarticular zone and mild left neural foraminal narrowing. No spinal canal stenosis.  Compared to prior study, there is progression of the facet arthrosis and spinal canal stenosis at L3-4.  IMPRESSION: 1. Interval progression of grade 1 anterolisthesis of L3 over L4. 2. Multilevel degenerative changes of the lumbar spine, worst at L3-4 where there is mild spinal canal stenosis with narrowing of the bilateral subarticular zones, mild right and moderate left neural foraminal narrowing, mildly progressed from prior MRI. 3. Mild left neural foraminal narrowing at L5-S1.  Electronically Signed   By: Pedro Earls M.D.   On: 03/23/2020 21:33 Note: Reviewed        Physical Exam  General appearance: Well nourished, well developed, and well hydrated. In no apparent acute distress Mental status: Alert, oriented x 3 (person, place, & time)       Respiratory: No evidence of acute respiratory distress Eyes: PERLA Vitals: BP 136/80   Pulse 83   Temp (!) 97.4 F (36.3 C) (Temporal)   Resp 16   Ht '5\' 9"'  (1.753 m)   Wt 238 lb (108 kg)   SpO2 98%   BMI 35.15 kg/m  BMI: Estimated  body mass index is 35.15 kg/m as calculated from the  following:   Height as of this encounter: '5\' 9"'  (1.753 m).   Weight as of this encounter: 238 lb (108 kg). Ideal: Ideal body weight: 66.2 kg (145 lb 15.1 oz) Adjusted ideal body weight: 82.9 kg (182 lb 12.3 oz)  Lumbar Spine Area Exam  Skin & Axial Inspection: No masses, redness, or swelling Alignment: Symmetrical Functional ROM: Pain restricted ROM affecting primarily the left Stability: No instability detected Muscle Tone/Strength: Functionally intact. No obvious neuro-muscular anomalies detected. Sensory (Neurological): Dermatomal pain pattern left l3, l4 Palpation: No palpable anomalies       Provocative Tests: Hyperextension/rotation test: deferred today       Lumbar quadrant test (Kemp's test): (+) on the left for foraminal stenosis Lateral bending test: (+) ipsilateral radicular pain, on the left. Positive for left-sided foraminal stenosis.   Gait & Posture Assessment  Ambulation: Unassisted Gait: Relatively normal for age and body habitus Posture: Difficulty standing up straight, due to pain  Lower Extremity Exam    Side: Right lower extremity  Side: Left lower extremity  Stability: No instability observed          Stability: No instability observed          Skin & Extremity Inspection: Skin color, temperature, and hair growth are WNL. No peripheral edema or cyanosis. No masses, redness, swelling, asymmetry, or associated skin lesions. No contractures.  Skin & Extremity Inspection: Skin color, temperature, and hair growth are WNL. No peripheral edema or cyanosis. No masses, redness, swelling, asymmetry, or associated skin lesions. No contractures.  Functional ROM: Unrestricted ROM                  Functional ROM: Pain restricted ROM for hip joint Limited SLR (straight leg raise)  Muscle Tone/Strength: Functionally intact. No obvious neuro-muscular anomalies detected.  Muscle Tone/Strength: Functionally intact. No obvious neuro-muscular anomalies detected.  Sensory  (Neurological): Unimpaired        Sensory (Neurological): Dermatomal pain pattern        DTR: Patellar: deferred today Achilles: deferred today Plantar: deferred today  DTR: Patellar: deferred today Achilles: deferred today Plantar: deferred today  Palpation: No palpable anomalies  Palpation: No palpable anomalies    Assessment   Status Diagnosis  Worsening Worsening Worsening 1. Lumbar radiculopathy (left L3/4)   2. Spinal stenosis, lumbar region, with neurogenic claudication   3. Lumbar spondylosis   4. Chronic pain syndrome      Updated Problems: Problem  Spinal Stenosis, Lumbar Region, With Neurogenic Claudication    Plan of Care  Ms. TRICIA PLEDGER has a current medication list which includes the following long-term medication(s): amlodipine, atorvastatin, ibuprofen-diphenhydramine cit, metformin, metoprolol tartrate, and gabapentin.  Pharmacotherapy (Medications Ordered): Meds ordered this encounter  Medications  . gabapentin (NEURONTIN) 300 MG capsule    Sig: Take 1 capsule (300 mg total) by mouth 2 (two) times daily.    Dispense:  60 capsule    Refill:  0  . tiZANidine (ZANAFLEX) 4 MG tablet    Sig: Take 1 tablet (4 mg total) by mouth at bedtime as needed for muscle spasms.    Dispense:  30 tablet    Refill:  2    Do not place this medication, or any other prescription from our practice, on "Automatic Refill". Patient may have prescription filled one day early if pharmacy is closed on scheduled refill date.   Orders:  Orders Placed This Encounter  Procedures  .  Lumbar Epidural Injection    Standing Status:   Future    Standing Expiration Date:   05/28/2020    Scheduling Instructions:     Procedure: Interlaminar Lumbar Epidural Steroid injection (LESI)            Laterality: Midline L3/4 (LEFT)     Sedation: PO Valium     Timeframe: ASAA    Order Specific Question:   Where will this procedure be performed?    Answer:   [facility]  . Ambulatory  referral to Physical Therapy    Referral Priority:   Routine    Referral Type:   Physical Medicine    Referral Reason:   Specialty Services Required    Requested Specialty:   Physical Therapy    Number of Visits Requested:   1   Follow-up plan:   Return in about 3 weeks (around 05/18/2020) for Left L3/4 ESI with PO Valium.   Recent Visits No visits were found meeting these conditions. Showing recent visits within past 90 days and meeting all other requirements Today's Visits Date Type Provider Dept  04/27/20 Office Visit Emily Santa, MD [facility]  Showing today's visits and meeting all other requirements Future Appointments No visits were found meeting these conditions. Showing future appointments within next 90 days and meeting all other requirements  I discussed the assessment and treatment plan with the patient. The patient was provided an opportunity to ask questions and all were answered. The patient agreed with the plan and demonstrated an understanding of the instructions.  Patient advised to call back or seek an in-person evaluation if the symptoms or condition worsens.  Duration of encounter: 30 minutes.  Note by: Emily Santa, MD Date: 04/27/2020; Time: 3:06 PM

## 2020-04-27 NOTE — Patient Instructions (Addendum)
Gabapentin and tizanidine have been escribed to your pharmacy.  You have been referred for physical therapy.   Moderate Conscious Sedation, Adult Sedation is the use of medicines to promote relaxation and relieve discomfort and anxiety. Moderate conscious sedation is a type of sedation. Under moderate conscious sedation, you are less alert than normal, but you are still able to respond to instructions, touch, or both. Moderate conscious sedation is used during short medical and dental procedures. It is milder than deep sedation, which is a type of sedation under which you cannot be easily woken up. It is also milder than general anesthesia, which is the use of medicines to make you unconscious. Moderate conscious sedation allows you to return to your regular activities sooner. Tell a health care provider about:  Any allergies you have.  All medicines you are taking, including vitamins, herbs, eye drops, creams, and over-the-counter medicines.  Use of steroids (by mouth or creams).  Any problems you or family members have had with sedatives and anesthetic medicines.  Any blood disorders you have.  Any surgeries you have had.  Any medical conditions you have, such as sleep apnea.  Whether you are pregnant or may be pregnant.  Any use of cigarettes, alcohol, marijuana, or street drugs. What are the risks? Generally, this is a safe procedure. However, problems may occur, including:  Getting too much medicine (oversedation).  Nausea.  Allergic reaction to medicines.  Trouble breathing. If this happens, a breathing tube may be used to help with breathing. It will be removed when you are awake and breathing on your own.  Heart trouble.  Lung trouble. What happens before the procedure? Staying hydrated Follow instructions from your health care provider about hydration, which may include:  Up to 2 hours before the procedure - you may continue to drink clear liquids, such as water,  clear fruit juice, black coffee, and plain tea. Eating and drinking restrictions Follow instructions from your health care provider about eating and drinking, which may include:  8 hours before the procedure - stop eating heavy meals or foods such as meat, fried foods, or fatty foods.  6 hours before the procedure - stop eating light meals or foods, such as toast or cereal.  6 hours before the procedure - stop drinking milk or drinks that contain milk.  2 hours before the procedure - stop drinking clear liquids. Medicine Ask your health care provider about:  Changing or stopping your regular medicines. This is especially important if you are taking diabetes medicines or blood thinners.  Taking medicines such as aspirin and ibuprofen. These medicines can thin your blood. Do not take these medicines before your procedure if your health care provider instructs you not to.  Tests and exams  You will have a physical exam.  You may have blood tests done to show: ? How well your kidneys and liver are working. ? How well your blood can clot. General instructions  Plan to have someone take you home from the hospital or clinic.  If you will be going home right after the procedure, plan to have someone with you for 24 hours. What happens during the procedure?  An IV tube will be inserted into one of your veins.  Medicine to help you relax (sedative) will be given through the IV tube.  The medical or dental procedure will be performed. What happens after the procedure?  Your blood pressure, heart rate, breathing rate, and blood oxygen level will be monitored often until  the medicines you were given have worn off.  Do not drive for 24 hours. This information is not intended to replace advice given to you by your health care provider. Make sure you discuss any questions you have with your health care provider. Document Revised: 04/11/2017 Document Reviewed: 08/19/2015 Elsevier Patient  Education  [place]  What are the risk, side effects and possible complications? Generally speaking, most procedures are safe.  However, with any procedure there are risks, side effects, and the possibility of complications.  The risks and complications are dependent upon the sites that are lesioned, or the type of nerve block to be performed.  The closer the procedure is to the spine, the more serious the risks are.  Great care is taken when placing the radio frequency needles, block needles or lesioning probes, but sometimes complications can occur. 1. Infection: Any time there is an injection through the skin, there is a risk of infection.  This is why sterile conditions are used for these blocks.  There are four possible types of infection. 1. Localized skin infection. 2. Central Nervous System Infection-This can be in the form of Meningitis, which can be deadly. 3. Epidural Infections-This can be in the form of an epidural abscess, which can cause pressure inside of the spine, causing compression of the spinal cord with subsequent paralysis. This would require an emergency surgery to decompress, and there are no guarantees that the patient would recover from the paralysis. 4. Discitis-This is an infection of the intervertebral discs.  It occurs in about 1% of discography procedures.  It is difficult to treat and it may lead to surgery.        2. Pain: the needles have to go through skin and soft tissues, will cause soreness.       3. Damage to internal structures:  The nerves to be lesioned may be near blood vessels or    other nerves which can be potentially damaged.       4. Bleeding: Bleeding is more common if the patient is taking blood thinners such as  aspirin, Coumadin, Ticiid, Plavix, etc., or if he/she have some genetic predisposition  such as hemophilia. Bleeding into the spinal canal can cause compression of the spinal  cord with subsequent  paralysis.  This would require an emergency surgery to  decompress and there are no guarantees that the patient would recover from the  paralysis.       5. Pneumothorax:  Puncturing of a lung is a possibility, every time a needle is introduced in  the area of the chest or upper back.  Pneumothorax refers to free air around the  collapsed lung(s), inside of the thoracic cavity (chest cavity).  Another two possible  complications related to a similar event would include: Hemothorax and Chylothorax.   These are variations of the Pneumothorax, where instead of air around the collapsed  lung(s), you may have blood or chyle, respectively.       6. Spinal headaches: They may occur with any procedures in the area of the spine.       7. Persistent CSF (Cerebro-Spinal Fluid) leakage: This is a rare problem, but may occur  with prolonged intrathecal or epidural catheters either due to the formation of a fistulous  track or a dural tear.       8. Nerve damage: By working so close to the spinal cord, there is always a possibility of  nerve damage, which  could be as serious as a permanent spinal cord injury with  paralysis.       9. Death:  Although rare, severe deadly allergic reactions known as "Anaphylactic  reaction" can occur to any of the medications used.      10. Worsening of the symptoms:  We can always make thing worse.  What are the chances of something like this happening? Chances of any of this occuring are extremely low.  By statistics, you have more of a chance of getting killed in a motor vehicle accident: while driving to the hospital than any of the above occurring .  Nevertheless, you should be aware that they are possibilities.  In general, it is similar to taking a shower.  Everybody knows that you can slip, hit your head and get killed.  Does that mean that you should not shower again?  Nevertheless always keep in mind that statistics do not mean anything if you happen to be on the wrong side of  them.  Even if a procedure has a 1 (one) in a 1,000,000 (million) chance of going wrong, it you happen to be that one..Also, keep in mind that by statistics, you have more of a chance of having something go wrong when taking medications.  Who should not have this procedure? If you are on a blood thinning medication (e.g. Coumadin, Plavix, see list of "Blood Thinners"), or if you have an active infection going on, you should not have the procedure.  If you are taking any blood thinners, please inform your physician.  How should I prepare for this procedure?  Do not eat or drink anything at least six hours prior to the procedure.  Bring a driver with you .  It cannot be a taxi.  Come accompanied by an adult that can drive you back, and that is strong enough to help you if your legs get weak or numb from the local anesthetic.  Take all of your medicines the morning of the procedure with just enough water to swallow them.  If you have diabetes, make sure that you are scheduled to have your procedure done first thing in the morning, whenever possible.  If you have diabetes, take only half of your insulin dose and notify our nurse that you have done so as soon as you arrive at the clinic.  If you are diabetic, but only take blood sugar pills (oral hypoglycemic), then do not take them on the morning of your procedure.  You may take them after you have had the procedure.  Do not take aspirin or any aspirin-containing medications, at least eleven (11) days prior to the procedure.  They may prolong bleeding.  Wear loose fitting clothing that may be easy to take off and that you would not mind if it got stained with Betadine or blood.  Do not wear any jewelry or perfume  Remove any nail coloring.  It will interfere with some of our monitoring equipment.  NOTE: Remember that this is not meant to be interpreted as a complete list of all possible complications.  Unforeseen problems may occur.  BLOOD  THINNERS The following drugs contain aspirin or other products, which can cause increased bleeding during surgery and should not be taken for 2 weeks prior to and 1 week after surgery.  If you should need take something for relief of minor pain, you may take acetaminophen which is found in Tylenol,m Datril, Anacin-3 and Panadol. It is not blood thinner. The products  listed below are.  Do not take any of the products listed below in addition to any listed on your instruction sheet.  A.P.C or A.P.C with Codeine Codeine Phosphate Capsules #3 Ibuprofen Ridaura  ABC compound Congesprin Imuran rimadil  Advil Cope Indocin Robaxisal  Alka-Seltzer Effervescent Pain Reliever and Antacid Coricidin or Coricidin-D  Indomethacin Rufen  Alka-Seltzer plus Cold Medicine Cosprin Ketoprofen S-A-C Tablets  Anacin Analgesic Tablets or Capsules Coumadin Korlgesic Salflex  Anacin Extra Strength Analgesic tablets or capsules CP-2 Tablets Lanoril Salicylate  Anaprox Cuprimine Capsules Levenox Salocol  Anexsia-D Dalteparin Magan Salsalate  Anodynos Darvon compound Magnesium Salicylate Sine-off  Ansaid Dasin Capsules Magsal Sodium Salicylate  Anturane Depen Capsules Marnal Soma  APF Arthritis pain formula Dewitt's Pills Measurin Stanback  Argesic Dia-Gesic Meclofenamic Sulfinpyrazone  Arthritis Bayer Timed Release Aspirin Diclofenac Meclomen Sulindac  Arthritis pain formula Anacin Dicumarol Medipren Supac  Analgesic (Safety coated) Arthralgen Diffunasal Mefanamic Suprofen  Arthritis Strength Bufferin Dihydrocodeine Mepro Compound Suprol  Arthropan liquid Dopirydamole Methcarbomol with Aspirin Synalgos  ASA tablets/Enseals Disalcid Micrainin Tagament  Ascriptin Doan's Midol Talwin  Ascriptin A/D Dolene Mobidin Tanderil  Ascriptin Extra Strength Dolobid Moblgesic Ticlid  Ascriptin with Codeine Doloprin or Doloprin with Codeine Momentum Tolectin  Asperbuf Duoprin Mono-gesic Trendar  Aspergum Duradyne Motrin or Motrin  IB Triminicin  Aspirin plain, buffered or enteric coated Durasal Myochrisine Trigesic  Aspirin Suppositories Easprin Nalfon Trillsate  Aspirin with Codeine Ecotrin Regular or Extra Strength Naprosyn Uracel  Atromid-S Efficin Naproxen Ursinus  Auranofin Capsules Elmiron Neocylate Vanquish  Axotal Emagrin Norgesic Verin  Azathioprine Empirin or Empirin with Codeine Normiflo Vitamin E  Azolid Emprazil Nuprin Voltaren  Bayer Aspirin plain, buffered or children's or timed BC Tablets or powders Encaprin Orgaran Warfarin Sodium  Buff-a-Comp Enoxaparin Orudis Zorpin  Buff-a-Comp with Codeine Equegesic Os-Cal-Gesic   Buffaprin Excedrin plain, buffered or Extra Strength Oxalid   Bufferin Arthritis Strength Feldene Oxphenbutazone   Bufferin plain or Extra Strength Feldene Capsules Oxycodone with Aspirin   Bufferin with Codeine Fenoprofen Fenoprofen Pabalate or Pabalate-SF   Buffets II Flogesic Panagesic   Buffinol plain or Extra Strength Florinal or Florinal with Codeine Panwarfarin   Buf-Tabs Flurbiprofen Penicillamine   Butalbital Compound Four-way cold tablets Penicillin   Butazolidin Fragmin Pepto-Bismol   Carbenicillin Geminisyn Percodan   Carna Arthritis Reliever Geopen Persantine   Carprofen Gold's salt Persistin   Chloramphenicol Goody's Phenylbutazone   Chloromycetin Haltrain Piroxlcam   Clmetidine heparin Plaquenil   Cllnoril Hyco-pap Ponstel   Clofibrate Hydroxy chloroquine Propoxyphen         Before stopping any of these medications, be sure to consult the physician who ordered them.  Some, such as Coumadin (Warfarin) are ordered to prevent or treat serious conditions such as "deep thrombosis", "pumonary embolisms", and other heart problems.  The amount of time that you may need off of the medication may also vary with the medication and the reason for which you were taking it.  If you are taking any of these medications, please make sure you notify your pain physician before you  undergo any procedures.         Epidural Steroid Injection Patient Information  Description: The epidural space surrounds the nerves as they exit the spinal cord.  In some patients, the nerves can be compressed and inflamed by a bulging disc or a tight spinal canal (spinal stenosis).  By injecting steroids into the epidural space, we can bring irritated nerves into direct contact with a potentially helpful medication.  These steroids  act directly on the irritated nerves and can reduce swelling and inflammation which often leads to decreased pain.  Epidural steroids may be injected anywhere along the spine and from the neck to the low back depending upon the location of your pain.   After numbing the skin with local anesthetic (like Novocaine), a small needle is passed into the epidural space slowly.  You may experience a sensation of pressure while this is being done.  The entire block usually last less than 10 minutes.  Conditions which may be treated by epidural steroids:   Low back and leg pain  Neck and arm pain  Spinal stenosis  Post-laminectomy syndrome  Herpes zoster (shingles) pain  Pain from compression fractures  Preparation for the injection:  7. Do not eat any solid food or dairy products within 8 hours of your appointment.  8. You may drink clear liquids up to 3 hours before appointment.  Clear liquids include water, black coffee, juice or soda.  No milk or cream please. 9. You may take your regular medication, including pain medications, with a sip of water before your appointment  Diabetics should hold regular insulin (if taken separately) and take 1/2 normal NPH dos the morning of the procedure.  Carry some sugar containing items with you to your appointment. 10. A driver must accompany you and be prepared to drive you home after your procedure.  11. Bring all your current medications with your. 12. An IV may be inserted and sedation may be given at the discretion  of the physician.   13. A blood pressure cuff, EKG and other monitors will often be applied during the procedure.  Some patients may need to have extra oxygen administered for a short period. 93. You will be asked to provide medical information, including your allergies, prior to the procedure.  We must know immediately if you are taking blood thinners (like Coumadin/Warfarin)  Or if you are allergic to IV iodine contrast (dye). We must know if you could possible be pregnant.  Possible side-effects:  Bleeding from needle site  Infection (rare, may require surgery)  Nerve injury (rare)  Numbness & tingling (temporary)  Difficulty urinating (rare, temporary)  Spinal headache ( a headache worse with upright posture)  Light -headedness (temporary)  Pain at injection site (several days)  Decreased blood pressure (temporary)  Weakness in arm/leg (temporary)  Pressure sensation in back/neck (temporary)  Call if you experience:  Fever/chills associated with headache or increased back/neck pain.  Headache worsened by an upright position.  New onset weakness or numbness of an extremity below the injection site  Hives or difficulty breathing (go to the emergency room)  Inflammation or drainage at the infection site  Severe back/neck pain  Any new symptoms which are concerning to you  Please note:  Although the local anesthetic injected can often make your back or neck feel good for several hours after the injection, the pain will likely return.  It takes 3-7 days for steroids to work in the epidural space.  You may not notice any pain relief for at least that one week.  If effective, we will often do a series of three injections spaced 3-6 weeks apart to maximally decrease your pain.  After the initial series, we generally will wait several months before considering a repeat injection of the same type.  If you have any questions, please call [phone] [facility]

## 2020-04-27 NOTE — Progress Notes (Signed)
Safety precautions to be maintained throughout the outpatient stay will include: orient to surroundings, keep bed in low position, maintain call bell within reach at all times, provide assistance with transfer out of bed and ambulation.  

## 2020-05-02 NOTE — Progress Notes (Addendum)
Subjective:   Emily Stokes is a 59 y.o. female who presents for Medicare Annual (Subsequent) preventive examination.  I connected with Emily Stokes today by telephone and verified that I am speaking with the correct person using two identifiers. Location patient: home Location provider: work Persons participating in the virtual visit: patient, provider.   I discussed the limitations, risks, security and privacy concerns of performing an evaluation and management service by telephone and the availability of in person appointments. I also discussed with the patient that there may be a patient responsible charge related to this service. The patient expressed understanding and verbally consented to this telephonic visit.    Interactive audio and video telecommunications were attempted between this provider and patient, however failed, due to patient having technical difficulties OR patient did not have access to video capability.  We continued and completed visit with audio only.    Review of Systems    N/A  Cardiac Risk Factors include: diabetes mellitus;hypertension;dyslipidemia;obesity (BMI >30kg/m2)     Objective:    Today's Vitals   05/03/20 1428  PainSc: 9    There is no height or weight on file to calculate BMI.  Advanced Directives 05/03/2020 04/27/2020 10/21/2019 08/20/2019 11/04/2018 02/12/2018 01/07/2018  Does Patient Have a Medical Advance Directive? No No No No No No No  Would patient like information on creating a medical advance directive? No - Patient declined No - Patient declined - - No - Patient declined No - Patient declined No - Patient declined    Current Medications (verified) Outpatient Encounter Medications as of 05/03/2020  Medication Sig   acetaminophen (TYLENOL) 500 MG tablet Take 500 mg by mouth every 6 (six) hours as needed.   amLODipine (NORVASC) 10 MG tablet Take 1 tablet (10 mg total) by mouth daily.   aspirin 81 MG tablet Take 81 mg by mouth daily.    atorvastatin (LIPITOR) 20 MG tablet Take 1 tablet (20 mg total) by mouth daily.   benzonatate (TESSALON) 100 MG capsule Take 1 capsule (100 mg total) by mouth 3 (three) times daily as needed for cough.   gabapentin (NEURONTIN) 300 MG capsule Take 1 capsule (300 mg total) by mouth 2 (two) times daily.   glucose blood (ONETOUCH VERIO) test strip USE TO TEST BLOOD SUGAR BEFORE BREAKFAST - RECHECK IF ANY SYMTOMPS OF HYPOGLYCEMIA   Ibuprofen-diphenhydrAMINE Cit (IBUPROFEN PM PO) Take by mouth at bedtime as needed.   Lancets (ONETOUCH DELICA PLUS [id number]) MISC USE AS DIRECTED TWICE DAILY IN THE MORNING AND AT BEDTIME   metFORMIN (GLUCOPHAGE) 500 MG tablet Take 2 tablets (1,000 mg total) by mouth 2 (two) times daily with a meal.   metoprolol tartrate (LOPRESSOR) 25 MG tablet TAKE ONE (1) TABLET BY MOUTH TWICE DAILY   tiZANidine (ZANAFLEX) 4 MG tablet Take 1 tablet (4 mg total) by mouth at bedtime as needed for muscle spasms.   No facility-administered encounter medications on file as of 05/03/2020.    Allergies (verified) Bactrim [sulfamethoxazole-trimethoprim]   History: Past Medical History:  Diagnosis Date   Abdominal pain, epigastric    Anemia    Backache    Bronchitis    Calculus, kidney    Carpal tunnel syndrome    Chest pain    Chicken pox    Circulatory disease    Diabetes mellitus without complication (HCC)    Disorder of kidney and ureter    Excess, menstruation    Hypercholesteremia    Hypertension, essential, benign  Infected postoperative seroma    Iron deficiency    Malaise and fatigue    Measles    MRSA (methicillin resistant staph aureus) culture positive    Nausea alone    Nonspecific abnormal electrocardiogram (ECG) (EKG)    Pain in joint, lower leg    Past Surgical History:  Procedure Laterality Date   ABDOMINAL HYSTERECTOMY     CESAREAN SECTION     COLONOSCOPY  1997   COLONOSCOPY WITH PROPOFOL N/A 01/09/2018   Procedure: COLONOSCOPY WITH PROPOFOL;   Surgeon: Virgel Manifold, MD;  Location: [facility] ENDOSCOPY;  Service: Endoscopy;  Laterality: N/A;   COLONOSCOPY WITH PROPOFOL N/A 08/20/2019   Procedure: COLONOSCOPY WITH PROPOFOL;  Surgeon: Lucilla Lame, MD;  Location: [facility] ENDOSCOPY;  Service: Endoscopy;  Laterality: N/A;   COLOSTOMY TAKEDOWN  2012   Hartmann's procedure.     HERNIA REPAIR  0000000   umbilical   LITHOTRIPSY  0000000   with complications, hospitalized due to these complications for 3 months   mrsa     removal on nose   PARTIAL HYSTERECTOMY  2009    vaginal hysterectomy, has both ovaries   Family History  Problem Relation Age of Onset   Hypertension Father    Stroke Father    Diabetes Mother    Hypertension Mother    Diabetes Other        sibling   Diabetes Other        sibling   Diabetes Other        sibling   Hypertension Other        sibling   Hypertension Other        sibling   Hypertension Other        sibling   Social History   Socioeconomic History   Marital status: Widowed    Spouse name: Not on file   Number of children: 4   Years of education: Not on file   Highest education level: Bachelor's degree (e.g., BA, AB, BS)  Occupational History   Occupation: Control and instrumentation engineer    Comment: disability  Tobacco Use   Smoking status: Never Smoker   Smokeless tobacco: Never Used  Vaping Use   Vaping Use: Never used  Substance and Sexual Activity   Alcohol use: No    Alcohol/week: 0.0 standard drinks   Drug use: No   Sexual activity: Never  Other Topics Concern   Not on file  Social History Narrative   Not on file   Social Determinants of Health   Financial Resource Strain: Low Risk    Difficulty of Paying Living Expenses: Not hard at all  Food Insecurity: No Food Insecurity   Worried About Charity fundraiser in the Last Year: Never true   [place] in the Last Year: Never true  Transportation Needs: No Transportation Needs   Lack of Transportation (Medical): No   Lack of  Transportation (Non-Medical): No  Physical Activity: Inactive   Days of Exercise per Week: 0 days   Minutes of Exercise per Session: 0 min  Stress: No Stress Concern Present   Feeling of Stress : Only a little  Social Connections: Moderately Isolated   Frequency of Communication with Friends and Family: More than three times a week   Frequency of Social Gatherings with Friends and Family: More than three times a week   Attends Religious Services: More than 4 times per year   Active Member of Clubs or Organizations: No   Attends  Club or Organization Meetings: Never   Marital Status: Widowed    Tobacco Counseling Counseling given: Not Answered   Clinical Intake:  Pre-visit preparation completed: Yes  Pain : 0-10 Pain Score: 9  Pain Type: Chronic pain Pain Location: Leg Pain Orientation: Left Pain Descriptors / Indicators: Aching Pain Frequency: Constant Pain Relieving Factors: Pt is taking Gabapentin and Tizanidine for pain.  Pain Relieving Factors: Pt is taking Gabapentin and Tizanidine for pain.  Nutritional Risks: None Diabetes: Yes  How often do you need to have someone help you when you read instructions, pamphlets, or other written materials from your doctor or pharmacy?: 1 - Never  Diabetic? Yes  Nutrition Risk Assessment:  Has the patient had any N/V/D within the last 2 months?  No  Does the patient have any non-healing wounds?  No  Has the patient had any unintentional weight loss or weight gain? No  Diabetes:  Is the patient diabetic?  Yes  If diabetic, was a CBG obtained today?  No  Did the patient bring in their glucometer from home?  No  How often do you monitor your CBG's? Twice a day.   Financial Strains and Diabetes Management:  Are you having any financial strains with the device, your supplies or your medication? No .  Does the patient want to be seen by Chronic Care Management for management of their diabetes?  No  Would the patient like to  be referred to a Nutritionist or for Diabetic Management?  No   Diabetic Exams:  Diabetic Eye Exam: Completed 09/15/19 Diabetic Foot Exam: Completed 07/27/19   Interpreter Needed?: No  Information entered by :: [facility], LPN   Activities of Daily Living In your present state of health, do you have any difficulty performing the following activities: 05/03/2020 02/11/2020  Hearing? N N  Vision? N Y  Difficulty concentrating or making decisions? N N  Walking or climbing stairs? Y Y  Comment Due to left leg pain and tiredness. -  Dressing or bathing? N N  Doing errands, shopping? N N  Preparing Food and eating ? N -  Using the Toilet? N -  In the past six months, have you accidently leaked urine? Y -  Comment Often has urine leakage. Pt to schedule f/u with PCP to get a referral to see a urologist. -  Do you have problems with loss of bowel control? N -  Managing your Medications? N -  Managing your Finances? N -  Housekeeping or managing your Housekeeping? N -  Some recent data might be hidden    Patient Care Team: Chrismon, [name], PA-C as PCP - General (Physician Assistant) Pa, [place] (Optometry) Gillis Santa, MD as Consulting Physician (Pain Medicine)  Indicate any recent Medical Services you may have received from other than [facility] providers in the past year (date may be approximate).     Assessment:   This is a routine wellness examination for [place].  Hearing/Vision screen No exam data present  Dietary issues and exercise activities discussed: Current Exercise Habits: The patient does not participate in regular exercise at present, Exercise limited by: orthopedic condition(s)  Goals      Exercise 2x per week (45 min per time)     Recommend increasing exercise weekly. Pt to start physical therapy 2 times a week for 45 minutes.      Have 3 meals a day     Recommend to start eating 3 meals a day with 2 healthy snacks  in between.     LIFESTYLE -  DECREASE FALLS RISK     Recommend to remove any items from the home that may cause slips or trips.       Depression Screen PHQ 2/9 Scores 05/03/2020 02/11/2020 11/04/2018 03/18/2018 02/12/2018 01/07/2018 10/31/2017  PHQ - 2 Score 0 0 0 0 0 0 3  PHQ- 9 Score - 0 - - - - 7    Fall Risk Fall Risk  05/03/2020 04/27/2020 10/21/2019 11/04/2018 03/18/2018  Falls in the past year? 1 0 0 0 0  Number falls in past yr: 0 - - - -  Comment - - - - -  Injury with Fall? 0 - - - -  Comment - - - - -  Risk for fall due to : Orthopedic patient - - - -  Follow up Falls prevention discussed - - - -    [place]:  Any stairs in or around the home? Yes  If so, are there any without handrails? No  Home free of loose throw rugs in walkways, pet beds, electrical cords, etc? Yes  Adequate lighting in your home to reduce risk of falls? Yes   ASSISTIVE DEVICES UTILIZED TO PREVENT FALLS:  Life alert? No  Use of a cane, walker or w/c? No  Grab bars in the bathroom? No  Shower chair or bench in shower? No  Elevated toilet seat or a handicapped toilet? No    Cognitive Function: Normal cognitive status assessed by observation by this Nurse Health Advisor. No abnormalities found.       6CIT Screen 10/24/2016  What Year? 0 points  What month? 0 points  What time? 0 points  Count back from 20 0 points  Months in reverse 0 points  Repeat phrase 6 points  Total Score 6    Immunizations Immunization History  Administered Date(s) Administered   Influenza Split 05/26/2007   Influenza,inj,Quad PF,6+ Mos 05/31/2016, 05/19/2017, 01/30/2018, 02/11/2020   PFIZER SARS-COV-2 Vaccination 08/05/2019, 08/31/2019   Pneumococcal Polysaccharide-23 01/30/2018    TDAP status: Due, Education has been provided regarding the importance of this vaccine. Advised may receive this vaccine at local pharmacy or Health Dept. Aware to provide a copy of the vaccination record if obtained from local  pharmacy or Health Dept. Verbalized acceptance and understanding.  Flu Vaccine status: Up to date  Covid-19 vaccine status: Completed vaccines  Qualifies for Shingles Vaccine? Yes   Zostavax completed No   Shingrix Completed?: No.    Education has been provided regarding the importance of this vaccine. Patient has been advised to call insurance company to determine out of pocket expense if they have not yet received this vaccine. Advised may also receive vaccine at local pharmacy or Health Dept. Verbalized acceptance and understanding.  Screening Tests Health Maintenance  Topic Date Due   URINE MICROALBUMIN  01/31/2019   HEMOGLOBIN A1C  01/28/2020   COVID-19 Vaccine (3 - Booster for Pfizer series) 03/01/2020   TETANUS/TDAP  05/03/2021 (Originally 11/24/1979)   FOOT EXAM  07/26/2020   OPHTHALMOLOGY EXAM  09/14/2020   MAMMOGRAM  07/28/2021   COLONOSCOPY  08/19/2024   INFLUENZA VACCINE  Completed   PNEUMOCOCCAL POLYSACCHARIDE VACCINE AGE 62-64 HIGH RISK  Completed   Hepatitis C Screening  Completed   HIV Screening  Completed    Health Maintenance  Health Maintenance Due  Topic Date Due   URINE MICROALBUMIN  01/31/2019   HEMOGLOBIN A1C  01/28/2020   COVID-19  Vaccine (3 - Booster for Pfizer series) 03/01/2020    Colorectal cancer screening: Type of screening: Colonoscopy. Completed 08/20/19. Repeat every 5 years  Mammogram status: Completed 07/29/19. Repeat yearly.  Lung Cancer Screening: (Low Dose CT Chest recommended if Age 17-80 years, 30 pack-year currently smoking OR have quit w/in 15years.) does not qualify.   Additional Screening:  Hepatitis C Screening: Up to date  Vision Screening: Recommended annual ophthalmology exams for early detection of glaucoma and other disorders of the eye. Is the patient up to date with their annual eye exam?  Yes  Who is the provider or what is the name of the office in which the patient attends annual eye exams? [facility] If pt is  not established with a provider, would they like to be referred to a provider to establish care? No .   Dental Screening: Recommended annual dental exams for proper oral hygiene  Community Resource Referral / Chronic Care Management: CRR required this visit?  No   CCM required this visit?  No      Plan:     I have personally reviewed and noted the following in the patient's chart:   Medical and social history Use of alcohol, tobacco or illicit drugs  Current medications and supplements Functional ability and status Nutritional status Physical activity Advanced directives List of other physicians Hospitalizations, surgeries, and ER visits in previous 12 months Vitals Screenings to include cognitive, depression, and falls Referrals and appointments  In addition, I have reviewed and discussed with patient certain preventive protocols, quality metrics, and best practice recommendations. A written personalized care plan for preventive services as well as general preventive health recommendations were provided to patient.     Tatiyana Foucher [place], [place]   [id number]   Nurse Notes: Pt needs a urine check and Hgb A1c check completed at next in office apt. Pt plans to receive her Covid booster at her local pharmacy.   Reviewed note and plan of Nurse Health Advisor. Agree with documentation and recommendations.

## 2020-05-03 ENCOUNTER — Other Ambulatory Visit: Payer: Self-pay

## 2020-05-03 ENCOUNTER — Ambulatory Visit (INDEPENDENT_AMBULATORY_CARE_PROVIDER_SITE_OTHER): Payer: Medicare Other

## 2020-05-03 DIAGNOSIS — Z0000 Encounter for general adult medical examination without abnormal findings: Secondary | ICD-10-CM

## 2020-05-03 NOTE — Patient Instructions (Signed)
[name] , Thank you for taking time to come for your Medicare Wellness Visit. I appreciate your ongoing commitment to your health goals. Please review the following plan we discussed and let me know if I can assist you in the future.   Screening recommendations/referrals: Colonoscopy: Up to date, due 08/2024 Mammogram: Up to date, due 07/2020 Recommended yearly ophthalmology/optometry visit for glaucoma screening and checkup Recommended yearly dental visit for hygiene and checkup  Vaccinations: Influenza vaccine: Done 02/11/20 Tdap vaccine: Currently due, declined receiving. Shingles vaccine: Shingrix discussed. Please contact your pharmacy for coverage information.     Advanced directives: Advance directive discussed with you today. Even though you declined this today please call our office should you change your mind and we can give you the proper paperwork for you to fill out.  Conditions/risks identified: Fall risk preventatives discussed today. Recommend to start PT two days a week and focus on eating a balanced diet.   Next appointment: 05/09/20 @ 10:00 AM with [name] 60 Years and Older, Female Preventive care refers to lifestyle choices and visits with your health care provider that can promote health and wellness. What does preventive care include?  A yearly physical exam. This is also called an annual well check.  Dental exams once or twice a year.  Routine eye exams. Ask your health care provider how often you should have your eyes checked.  Personal lifestyle choices, including:  Daily care of your teeth and gums.  Regular physical activity.  Eating a healthy diet.  Avoiding tobacco and drug use.  Limiting alcohol use.  Practicing safe sex.  Taking low-dose aspirin every day.  Taking vitamin and mineral supplements as recommended by your health care provider. What happens during an annual well check? The services and screenings  done by your health care provider during your annual well check will depend on your age, overall health, lifestyle risk factors, and family history of disease. Counseling  Your health care provider may ask you questions about your:  Alcohol use.  Tobacco use.  Drug use.  Emotional well-being.  Home and relationship well-being.  Sexual activity.  Eating habits.  History of falls.  Memory and ability to understand (cognition).  Work and work Statistician.  Reproductive health. Screening  You may have the following tests or measurements:  Height, weight, and BMI.  Blood pressure.  Lipid and cholesterol levels. These may be checked every 5 years, or more frequently if you are over 31 years old.  Skin check.  Lung cancer screening. You may have this screening every year starting at age 9 if you have a 30-pack-year history of smoking and currently smoke or have quit within the past 15 years.  Fecal occult blood test (FOBT) of the stool. You may have this test every year starting at age 73.  Flexible sigmoidoscopy or colonoscopy. You may have a sigmoidoscopy every 5 years or a colonoscopy every 10 years starting at age 48.  Hepatitis C blood test.  Hepatitis B blood test.  Sexually transmitted disease (STD) testing.  Diabetes screening. This is done by checking your blood sugar (glucose) after you have not eaten for a while (fasting). You may have this done every 1-3 years.  Bone density scan. This is done to screen for osteoporosis. You may have this done starting at age 91.  Mammogram. This may be done every 1-2 years. Talk to your health care provider about how often you should have regular mammograms. Talk  with your health care provider about your test results, treatment options, and if necessary, the need for more tests. Vaccines  Your health care provider may recommend certain vaccines, such as:  Influenza vaccine. This is recommended every year.  Tetanus,  diphtheria, and acellular pertussis (Tdap, Td) vaccine. You may need a Td booster every 10 years.  Zoster vaccine. You may need this after age 37.  Pneumococcal 13-valent conjugate (PCV13) vaccine. One dose is recommended after age 10.  Pneumococcal polysaccharide (PPSV23) vaccine. One dose is recommended after age 3. Talk to your health care provider about which screenings and vaccines you need and how often you need them. This information is not intended to replace advice given to you by your health care provider. Make sure you discuss any questions you have with your health care provider. Document Released: 05/26/2015 Document Revised: 01/17/2016 Document Reviewed: 02/28/2015 Elsevier Interactive Patient Education  2017 [place] Prevention in the Home Falls can cause injuries. They can happen to people of all ages. There are many things you can do to make your home safe and to help prevent falls. What can I do on the outside of my home?  Regularly fix the edges of walkways and driveways and fix any cracks.  Remove anything that might make you trip as you walk through a door, such as a raised step or threshold.  Trim any bushes or trees on the path to your home.  Use bright outdoor lighting.  Clear any walking paths of anything that might make someone trip, such as rocks or tools.  Regularly check to see if handrails are loose or broken. Make sure that both sides of any steps have handrails.  Any raised decks and porches should have guardrails on the edges.  Have any leaves, snow, or ice cleared regularly.  Use sand or salt on walking paths during winter.  Clean up any spills in your garage right away. This includes oil or grease spills. What can I do in the bathroom?  Use night lights.  Install grab bars by the toilet and in the tub and shower. Do not use towel bars as grab bars.  Use non-skid mats or decals in the tub or shower.  If you need to sit down in  the shower, use a plastic, non-slip stool.  Keep the floor dry. Clean up any water that spills on the floor as soon as it happens.  Remove soap buildup in the tub or shower regularly.  Attach bath mats securely with double-sided non-slip rug tape.  Do not have throw rugs and other things on the floor that can make you trip. What can I do in the bedroom?  Use night lights.  Make sure that you have a light by your bed that is easy to reach.  Do not use any sheets or blankets that are too big for your bed. They should not hang down onto the floor.  Have a firm chair that has side arms. You can use this for support while you get dressed.  Do not have throw rugs and other things on the floor that can make you trip. What can I do in the kitchen?  Clean up any spills right away.  Avoid walking on wet floors.  Keep items that you use a lot in easy-to-reach places.  If you need to reach something above you, use a strong step stool that has a grab bar.  Keep electrical cords out of the way.  Do  not use floor polish or wax that makes floors slippery. If you must use wax, use non-skid floor wax.  Do not have throw rugs and other things on the floor that can make you trip. What can I do with my stairs?  Do not leave any items on the stairs.  Make sure that there are handrails on both sides of the stairs and use them. Fix handrails that are broken or loose. Make sure that handrails are as long as the stairways.  Check any carpeting to make sure that it is firmly attached to the stairs. Fix any carpet that is loose or worn.  Avoid having throw rugs at the top or bottom of the stairs. If you do have throw rugs, attach them to the floor with carpet tape.  Make sure that you have a light switch at the top of the stairs and the bottom of the stairs. If you do not have them, ask someone to add them for you. What else can I do to help prevent falls?  Wear shoes that:  Do not have high  heels.  Have rubber bottoms.  Are comfortable and fit you well.  Are closed at the toe. Do not wear sandals.  If you use a stepladder:  Make sure that it is fully opened. Do not climb a closed stepladder.  Make sure that both sides of the stepladder are locked into place.  Ask someone to hold it for you, if possible.  Clearly mark and make sure that you can see:  Any grab bars or handrails.  First and last steps.  Where the edge of each step is.  Use tools that help you move around (mobility aids) if they are needed. These include:  Canes.  Walkers.  Scooters.  Crutches.  Turn on the lights when you go into a dark area. Replace any light bulbs as soon as they burn out.  Set up your furniture so you have a clear path. Avoid moving your furniture around.  If any of your floors are uneven, fix them.  If there are any pets around you, be aware of where they are.  Review your medicines with your doctor. Some medicines can make you feel dizzy. This can increase your chance of falling. Ask your doctor what other things that you can do to help prevent falls. This information is not intended to replace advice given to you by your health care provider. Make sure you discuss any questions you have with your health care provider. Document Released: 02/23/2009 Document Revised: 10/05/2015 Document Reviewed: 06/03/2014 Elsevier Interactive Patient Education  2017 [employer].

## 2020-05-09 ENCOUNTER — Other Ambulatory Visit: Payer: Self-pay | Admitting: Family Medicine

## 2020-05-09 ENCOUNTER — Other Ambulatory Visit: Payer: Self-pay

## 2020-05-09 ENCOUNTER — Encounter: Payer: Self-pay | Admitting: Family Medicine

## 2020-05-09 ENCOUNTER — Ambulatory Visit (INDEPENDENT_AMBULATORY_CARE_PROVIDER_SITE_OTHER): Payer: Medicare Other | Admitting: Family Medicine

## 2020-05-09 VITALS — BP 116/60 | HR 91 | Temp 97.0°F | Ht 69.0 in | Wt 226.6 lb

## 2020-05-09 DIAGNOSIS — R32 Unspecified urinary incontinence: Secondary | ICD-10-CM

## 2020-05-09 DIAGNOSIS — I10 Essential (primary) hypertension: Secondary | ICD-10-CM

## 2020-05-09 DIAGNOSIS — N3000 Acute cystitis without hematuria: Secondary | ICD-10-CM

## 2020-05-09 DIAGNOSIS — E119 Type 2 diabetes mellitus without complications: Secondary | ICD-10-CM

## 2020-05-09 LAB — POCT URINALYSIS DIPSTICK
Bilirubin, UA: NEGATIVE
Glucose, UA: POSITIVE — AB
Ketones, UA: NEGATIVE
Nitrite, UA: NEGATIVE
Protein, UA: POSITIVE — AB
Spec Grav, UA: 1.01 (ref 1.010–1.025)
Urobilinogen, UA: 0.2 E.U./dL
pH, UA: 6 (ref 5.0–8.0)

## 2020-05-09 MED ORDER — CIPROFLOXACIN HCL 500 MG PO TABS: 500.0000 mg | ORAL_TABLET | Freq: Two times a day (BID) | ORAL | 0 refills | Status: DC

## 2020-05-09 NOTE — Telephone Encounter (Signed)
Requested medication (s) are due for refill today:   Prescribed this morning  Requested medication (s) are on the active medication list:   Yes  Future visit scheduled:   No but was seen this morning by Maurine Minister Chrismon (05/09/2020)   Last ordered: 05/09/2020  Returned because phar.doesn't have this medication due to backorder/unavailable.  Requesting an alternative   Requested Prescriptions  Pending Prescriptions Disp Refills   ciprofloxacin (CIPRO) 500 MG tablet [Pharmacy Med Name: CIPROFLOXACIN HCL 500 MG TAB] 20 tablet 0    Sig: TAKE 1 TABLET BY MOUTH TWICE A DAY      Off-Protocol Failed - 05/09/2020  1:01 PM      Failed - Medication not assigned to a protocol, review manually.      Passed - Valid encounter within last 12 months    Recent Outpatient Visits           Today Urinary incontinence, unspecified type   [facility] Chrismon, Jodell Cipro, PA-C   1 month ago Bronchitis   [facility], Jodell Cipro, PA-C   2 months ago Urinary urgency   [facility] Chrismon, Jodell Cipro, PA-C   6 months ago Type 2 diabetes mellitus without complication, without long-term current use of insulin [facility])   [facility] Chrismon, Jodell Cipro, PA-C   8 months ago Acute cystitis without hematuria   [facility], Jodell Cipro, [place]

## 2020-05-09 NOTE — Progress Notes (Signed)
Established patient visit    Patient: Emily Stokes   DOB: September 04, 1960   59 y.o. Female  MRN: [id number] Visit Date: 05/09/2020  Today's healthcare provider: [name], PA-C   No chief complaint on file.  Subjective    HPI  Pt has urine incontinence and is unable to hold her urine when she feels the urge to use the restroom. Pt reports this has been going on for 2 weeks. Pt reports pain on right side of lumbar spine.  Past Medical History:  Diagnosis Date  . Abdominal pain, epigastric   . Anemia   . Backache   . Bronchitis   . Calculus, kidney   . Carpal tunnel syndrome   . Chest pain   . Chicken pox   . Circulatory disease   . Diabetes mellitus without complication ([place])   . Disorder of kidney and ureter   . Excess, menstruation   . Hypercholesteremia   . Hypertension, essential, benign   . Infected postoperative seroma   . Iron deficiency   . Malaise and fatigue   . Measles   . MRSA (methicillin resistant staph aureus) culture positive   . Nausea alone   . Nonspecific abnormal electrocardiogram (ECG) (EKG)   . Pain in joint, lower leg    Past Surgical History:  Procedure Laterality Date  . ABDOMINAL HYSTERECTOMY    . CESAREAN SECTION    . COLONOSCOPY  1997  . COLONOSCOPY WITH PROPOFOL N/A 01/09/2018   Procedure: COLONOSCOPY WITH PROPOFOL;  Surgeon: Virgel Manifold, MD;  Location: [facility] ENDOSCOPY;  Service: Endoscopy;  Laterality: N/A;  . COLONOSCOPY WITH PROPOFOL N/A 08/20/2019   Procedure: COLONOSCOPY WITH PROPOFOL;  Surgeon: Lucilla Lame, MD;  Location: [facility] ENDOSCOPY;  Service: Endoscopy;  Laterality: N/A;  . COLOSTOMY TAKEDOWN  2012  . Hartmann's procedure.    Marland Kitchen HERNIA REPAIR  0000000   umbilical  . LITHOTRIPSY  0000000   with complications, hospitalized due to these complications for 3 months  . mrsa     removal on nose  . PARTIAL HYSTERECTOMY  2009    vaginal hysterectomy, has both ovaries   Social History   Tobacco Use  . Smoking status:  Never Smoker  . Smokeless tobacco: Never Used  Vaping Use  . Vaping Use: Never used  Substance Use Topics  . Alcohol use: No    Alcohol/week: 0.0 standard drinks  . Drug use: No   Family History  Problem Relation Age of Onset  . Hypertension Father   . Stroke Father   . Diabetes Mother   . Hypertension Mother   . Diabetes Other        sibling  . Diabetes Other        sibling  . Diabetes Other        sibling  . Hypertension Other        sibling  . Hypertension Other        sibling  . Hypertension Other        sibling   Allergies  Allergen Reactions  . Bactrim [Sulfamethoxazole-Trimethoprim] Swelling       Medications: Outpatient Medications Prior to Visit  Medication Sig  . acetaminophen (TYLENOL) 500 MG tablet Take 500 mg by mouth every 6 (six) hours as needed.  Marland Kitchen amLODipine (NORVASC) 10 MG tablet Take 1 tablet (10 mg total) by mouth daily.  Marland Kitchen aspirin 81 MG tablet Take 81 mg by mouth daily.  Marland Kitchen atorvastatin (LIPITOR) 20 MG tablet  Take 1 tablet (20 mg total) by mouth daily.  . benzonatate (TESSALON) 100 MG capsule Take 1 capsule (100 mg total) by mouth 3 (three) times daily as needed for cough.  . gabapentin (NEURONTIN) 300 MG capsule Take 1 capsule (300 mg total) by mouth 2 (two) times daily.  Marland Kitchen glucose blood (ONETOUCH VERIO) test strip USE TO TEST BLOOD SUGAR BEFORE BREAKFAST - RECHECK IF ANY SYMTOMPS OF HYPOGLYCEMIA  . Ibuprofen-diphenhydrAMINE Cit (IBUPROFEN PM PO) Take by mouth at bedtime as needed.  . Lancets (ONETOUCH DELICA PLUS [id number]) MISC USE AS DIRECTED TWICE DAILY IN THE MORNING AND AT BEDTIME  . metFORMIN (GLUCOPHAGE) 500 MG tablet Take 2 tablets (1,000 mg total) by mouth 2 (two) times daily with a meal.  . metoprolol tartrate (LOPRESSOR) 25 MG tablet TAKE ONE (1) TABLET BY MOUTH TWICE DAILY  . tiZANidine (ZANAFLEX) 4 MG tablet Take 1 tablet (4 mg total) by mouth at bedtime as needed for muscle spasms.   No facility-administered medications prior to  visit.    Review of Systems  Constitutional: Negative for activity change, appetite change, chills, diaphoresis, fatigue, fever and unexpected weight change.  Genitourinary: Negative for difficulty urinating and dysuria.      Objective    BP 116/60 (BP Location: Left Arm, Patient Position: Sitting, Cuff Size: Large)   Pulse 91   Temp (!) 97 F (36.1 C) (Temporal)   Ht 5\' 9"  (1.753 m)   Wt 226 lb 9.6 oz (102.8 kg)   SpO2 98%   BMI 33.46 kg/m    Physical Exam Constitutional:      General: She is not in acute distress.    Appearance: She is well-developed and well-nourished.  HENT:     Head: Normocephalic and atraumatic.     Right Ear: Hearing normal.     Left Ear: Hearing normal.     Nose: Nose normal.  Eyes:     General: Lids are normal. No scleral icterus.       Right eye: No discharge.        Left eye: No discharge.     Conjunctiva/sclera: Conjunctivae normal.  Pulmonary:     Effort: Pulmonary effort is normal. No respiratory distress.  Abdominal:     General: Bowel sounds are normal.     Palpations: Abdomen is soft.     Tenderness: There is no abdominal tenderness.     Comments: Suprapubic tenderness to palpation. Questionable CVA tenderness to percussion with past history of chronic back pain.  Musculoskeletal:        General: Normal range of motion.  Skin:    General: Skin is intact.     Findings: No lesion or rash.  Neurological:     Mental Status: She is alert and oriented to person, place, and time.  Psychiatric:        Mood and Affect: Mood and affect normal.        Speech: Speech normal.        Behavior: Behavior normal.        Thought Content: Thought content normal.       No results found for any visits on 05/09/20.  Assessment & Plan     1. Urinary incontinence, unspecified type Onset over the past couple weeks. No fever but urinalysis indicates infection. - POCT Urinalysis Dipstick  2. Acute cystitis without hematuria No hematuria but  heavy pyuria on microscopic exam of urine. Will get C&S started and given antibiotic. Recheck pending reports. - CULTURE, URINE  COMPREHENSIVE - ciprofloxacin (CIPRO) 500 MG tablet; Take 1 tablet (500 mg total) by mouth 2 (two) times daily.  Dispense: 20 tablet; Refill: 0   No follow-ups on file.      I, Keundra Petrucelli, PA-C, have reviewed all documentation for this visit. The documentation on 05/09/20 for the exam, diagnosis, procedures, and orders are all accurate and complete.    Dortha Kern, PA-C  [facility] [employer] [phone] (phone) [phone] (fax)  [facility]

## 2020-05-09 NOTE — Patient Instructions (Signed)

## 2020-05-11 ENCOUNTER — Telehealth: Payer: Self-pay | Admitting: Family Medicine

## 2020-05-11 ENCOUNTER — Other Ambulatory Visit: Payer: Self-pay | Admitting: Family Medicine

## 2020-05-11 MED ORDER — CIPROFLOXACIN HCL 500 MG PO TABS: 500.0000 mg | ORAL_TABLET | Freq: Two times a day (BID) | ORAL | 0 refills | Status: DC

## 2020-05-11 NOTE — Telephone Encounter (Signed)
[facility] [phone]) [address] Has Cipro at 500 mg in stock. Pt was called and understands.

## 2020-05-11 NOTE — Telephone Encounter (Signed)
Ask patient if another pharmacy can be used. Check with [facility] or [facility]. Another pharmacy may still have it in stock.

## 2020-05-11 NOTE — Telephone Encounter (Signed)
1.  The pt states [facility] told her they have not had the  ciprofloxacin (CIPRO) 500 MG tablet For months.  It is on back order. She will need something else.  2. Pt's handi cap placard will expire end of this month and she needs new application

## 2020-05-14 LAB — CULTURE, URINE COMPREHENSIVE

## 2020-05-17 ENCOUNTER — Ambulatory Visit
Admission: RE | Admit: 2020-05-17 | Discharge: 2020-05-17 | Disposition: A | Discharge status: Home / Self Care | Payer: Medicare Other | Source: Ambulatory Visit | Attending: Student in an Organized Health Care Education/Training Program | Admitting: Student in an Organized Health Care Education/Training Program

## 2020-05-17 ENCOUNTER — Other Ambulatory Visit: Payer: Self-pay

## 2020-05-17 ENCOUNTER — Ambulatory Visit (HOSPITAL_BASED_OUTPATIENT_CLINIC_OR_DEPARTMENT_OTHER): Payer: Medicare Other | Admitting: Student in an Organized Health Care Education/Training Program

## 2020-05-17 VITALS — BP 162/98 | HR 97 | Temp 97.2°F | Resp 18 | Ht 69.0 in | Wt 233.0 lb

## 2020-05-17 DIAGNOSIS — M5416 Radiculopathy, lumbar region: Secondary | ICD-10-CM

## 2020-05-17 DIAGNOSIS — M48062 Spinal stenosis, lumbar region with neurogenic claudication: Secondary | ICD-10-CM | POA: Insufficient documentation

## 2020-05-17 DIAGNOSIS — M47816 Spondylosis without myelopathy or radiculopathy, lumbar region: Secondary | ICD-10-CM | POA: Insufficient documentation

## 2020-05-17 DIAGNOSIS — G894 Chronic pain syndrome: Secondary | ICD-10-CM

## 2020-05-17 MED ORDER — SODIUM CHLORIDE (PF) 0.9 % IJ SOLN
INTRAMUSCULAR | Status: AC
  Filled 2020-05-17: qty 10

## 2020-05-17 MED ORDER — IOHEXOL 180 MG/ML  SOLN
10.0000 mL | Freq: Once | INTRAMUSCULAR | Status: AC
  Administered 2020-05-17: 10 mL via EPIDURAL
  Filled 2020-05-17: qty 20

## 2020-05-17 MED ORDER — SODIUM CHLORIDE 0.9% FLUSH
2.0000 mL | Freq: Once | INTRAVENOUS | Status: AC
  Administered 2020-05-17: 2 mL

## 2020-05-17 MED ORDER — DEXAMETHASONE SODIUM PHOSPHATE 10 MG/ML IJ SOLN
10.0000 mg | Freq: Once | INTRAMUSCULAR | Status: AC
  Administered 2020-05-17: 10 mg
  Filled 2020-05-17: qty 1

## 2020-05-17 MED ORDER — ROPIVACAINE HCL 2 MG/ML IJ SOLN
2.0000 mL | Freq: Once | INTRAMUSCULAR | Status: AC
  Administered 2020-05-17: 2 mL via EPIDURAL
  Filled 2020-05-17: qty 10

## 2020-05-17 MED ORDER — DIAZEPAM 5 MG PO TABS
ORAL_TABLET | ORAL | Status: AC
  Filled 2020-05-17: qty 1

## 2020-05-17 MED ORDER — LIDOCAINE HCL 2 % IJ SOLN
20.0000 mL | Freq: Once | INTRAMUSCULAR | Status: AC
  Administered 2020-05-17: 200 mg
  Filled 2020-05-17: qty 20

## 2020-05-17 MED ORDER — DIAZEPAM 5 MG PO TABS
5.0000 mg | ORAL_TABLET | Freq: Once | ORAL | Status: AC
  Administered 2020-05-17: 5 mg via ORAL

## 2020-05-17 NOTE — Progress Notes (Signed)
Safety precautions to be maintained throughout the outpatient stay will include: orient to surroundings, keep bed in low position, maintain call bell within reach at all times, provide assistance with transfer out of bed and ambulation.  

## 2020-05-17 NOTE — Progress Notes (Signed)
PROVIDER NOTE: Information contained herein reflects review and annotations entered in association with encounter. Interpretation of such information and data should be left to medically-trained personnel. Information provided to patient can be located elsewhere in the medical record under "Patient Instructions". Document created using STT-dictation technology, any transcriptional errors that may result from process are unintentional.    Patient: Emily Stokes  Service Category: Procedure  Provider: Gillis Santa, MD  DOB: 1960-06-03  DOS: 05/17/2020  Location: [facility]  MRN: [id number]  Setting: Ambulatory - outpatient  Referring Provider: Margo Common, PA-C  Type: Established Patient  Specialty: Interventional Pain Management  PCP: Margo Common, PA-C   Primary Reason for Visit: Interventional Pain Management Treatment. CC: Back Pain  Procedure:          Anesthesia, Analgesia, Anxiolysis:  Type: Therapeutic Inter-Laminar Epidural Steroid Injection  #1 for 2022  Region: Lumbar Level: L3-4 Level. Laterality: Left         Type: Local Anesthesia with p.o. Valium  Local Anesthetic: Lidocaine 1-2%  Position: Prone with head of the table was raised to facilitate breathing.   Indications: 1. Lumbar radiculopathy (left L3/4)   2. Spinal stenosis, lumbar region, with neurogenic claudication   3. Lumbar spondylosis   4. Chronic pain syndrome    Pain Score: Pre-procedure: 8 /10 Post-procedure: 5  (walking around room, pain decreasing with time)/10   Pre-op H&P Assessment:  Emily Stokes is a 59 y.o. (year old), female patient, seen today for interventional treatment. She  has a past surgical history that includes Colonoscopy (1997); Partial hysterectomy (2009); Cesarean section; Lithotripsy (1997); mrsa; Hernia repair (2012); Abdominal hysterectomy; Hartmann's procedure.; Colostomy takedown (2012); Colonoscopy with propofol (N/A, 01/09/2018); and Colonoscopy with propofol  (N/A, 08/20/2019). Emily Stokes has a current medication list which includes the following prescription(s): acetaminophen, amlodipine, aspirin, atorvastatin, benzonatate, ciprofloxacin, gabapentin, onetouch verio, ibuprofen-diphenhydramine cit, onetouch delica plus [id number], metformin, and metoprolol tartrate. Her primarily concern today is the Back Pain  Initial Vital Signs:  Pulse/HCG Rate: 88  Temp: (!) 97.2 F (36.2 C) Resp: 18 BP: 128/74 SpO2: 98 %  BMI: Estimated body mass index is 34.41 kg/m as calculated from the following:   Height as of this encounter: 5\' 9"  (1.753 m).   Weight as of this encounter: 233 lb (105.7 kg).  Risk Assessment: Allergies: Reviewed. She is allergic to bactrim [sulfamethoxazole-trimethoprim].  Allergy Precautions: None required Coagulopathies: Reviewed. None identified.  Blood-thinner therapy: None at this time Active Infection(s): Reviewed. None identified. Emily Stokes is afebrile  Site Confirmation: Emily Stokes was asked to confirm the procedure and laterality before marking the site Procedure checklist: Completed Consent: Before the procedure and under the influence of no sedative(s), amnesic(s), or anxiolytics, the patient was informed of the treatment options, risks and possible complications. To fulfill our ethical and legal obligations, as recommended by the American Medical Association's Code of Ethics, I have informed the patient of my clinical impression; the nature and purpose of the treatment or procedure; the risks, benefits, and possible complications of the intervention; the alternatives, including doing nothing; the risk(s) and benefit(s) of the alternative treatment(s) or procedure(s); and the risk(s) and benefit(s) of doing nothing. The patient was provided information about the general risks and possible complications associated with the procedure. These may include, but are not limited to: failure to achieve desired goals, infection, bleeding,  organ or nerve damage, allergic reactions, paralysis, and death. In addition, the patient was informed of those risks and complications associated  to Spine-related procedures, such as failure to decrease pain; infection (i.e.: Meningitis, epidural or intraspinal abscess); bleeding (i.e.: epidural hematoma, subarachnoid hemorrhage, or any other type of intraspinal or peri-dural bleeding); organ or nerve damage (i.e.: Any type of peripheral nerve, nerve root, or spinal cord injury) with subsequent damage to sensory, motor, and/or autonomic systems, resulting in permanent pain, numbness, and/or weakness of one or several areas of the body; allergic reactions; (i.e.: anaphylactic reaction); and/or death. Furthermore, the patient was informed of those risks and complications associated with the medications. These include, but are not limited to: allergic reactions (i.e.: anaphylactic or anaphylactoid reaction(s)); adrenal axis suppression; blood sugar elevation that in diabetics may result in ketoacidosis or comma; water retention that in patients with history of congestive heart failure may result in shortness of breath, pulmonary edema, and decompensation with resultant heart failure; weight gain; swelling or edema; medication-induced neural toxicity; particulate matter embolism and blood vessel occlusion with resultant organ, and/or nervous system infarction; and/or aseptic necrosis of one or more joints. Finally, the patient was informed that Medicine is not an exact science; therefore, there is also the possibility of unforeseen or unpredictable risks and/or possible complications that may result in a catastrophic outcome. The patient indicated having understood very clearly. We have given the patient no guarantees and we have made no promises. Enough time was given to the patient to ask questions, all of which were answered to the patient's satisfaction. Emily Stokes has indicated that she wanted to continue with  the procedure. Attestation: I, the ordering provider, attest that I have discussed with the patient the benefits, risks, side-effects, alternatives, likelihood of achieving goals, and potential problems during recovery for the procedure that I have provided informed consent. Date  Time: 05/17/2020  9:51 AM  Pre-Procedure Preparation:  Monitoring: As per clinic protocol. Respiration, ETCO2, SpO2, BP, heart rate and rhythm monitor placed and checked for adequate function Safety Precautions: Patient was assessed for positional comfort and pressure points before starting the procedure. Time-out: I initiated and conducted the "Time-out" before starting the procedure, as per protocol. The patient was asked to participate by confirming the accuracy of the "Time Out" information. Verification of the correct person, site, and procedure were performed and confirmed by me, the nursing staff, and the patient. "Time-out" conducted as per Joint Commission's Universal Protocol (UP.01.01.01). Time: 1021  Description of Procedure:          Target Area: The interlaminar space, initially targeting the lower laminar border of the superior vertebral body. Approach: Paramedial approach. Area Prepped: Entire Posterior Lumbar Region DuraPrep (Iodine Povacrylex [0.7% available iodine] and Isopropyl Alcohol, 74% w/w) Safety Precautions: Aspiration looking for blood return was conducted prior to all injections. At no point did we inject any substances, as a needle was being advanced. No attempts were made at seeking any paresthesias. Safe injection practices and needle disposal techniques used. Medications properly checked for expiration dates. SDV (single dose vial) medications used. Description of the Procedure: Protocol guidelines were followed. The procedure needle was introduced through the skin, ipsilateral to the reported pain, and advanced to the target area. Bone was contacted and the needle walked caudad, until the  lamina was cleared. The epidural space was identified using "loss-of-resistance technique" with 2-3 ml of PF-NaCl (0.9% NSS), in a 5cc LOR glass syringe.  Vitals:   05/17/20 0956 05/17/20 1020 05/17/20 1030  BP: 128/74 (!) 165/100 (!) 162/98  Pulse: 88 96 97  Resp: 18 19 18   Temp: (!) 97.2  F (36.2 C)    SpO2:  98% 99%  Weight: 233 lb (105.7 kg)    Height: 5\' 9"  (1.753 m)      Start Time: 1021 hrs. End Time: 1026 hrs.  Materials:  Needle(s) Type: Epidural needle Gauge: 17G Length: 3.5-in Medication(s): Please see orders for medications and dosing details. 7 cc solution made of 4 cc of preservative-free saline, 2 cc of 0.2% ropivacaine, 1 cc of Decadron 10 mg/cc.  Imaging Guidance (Spinal):          Type of Imaging Technique: Fluoroscopy Guidance (Spinal) Indication(s): Assistance in needle guidance and placement for procedures requiring needle placement in or near specific anatomical locations not easily accessible without such assistance. Exposure Time: Please see nurses notes. Contrast: Before injecting any contrast, we confirmed that the patient did not have an allergy to iodine, shellfish, or radiological contrast. Once satisfactory needle placement was completed at the desired level, radiological contrast was injected. Contrast injected under live fluoroscopy. No contrast complications. See chart for type and volume of contrast used. Fluoroscopic Guidance: I was personally present during the use of fluoroscopy. "Tunnel Vision Technique" used to obtain the best possible view of the target area. Parallax error corrected before commencing the procedure. "Direction-depth-direction" technique used to introduce the needle under continuous pulsed fluoroscopy. Once target was reached, antero-posterior, oblique, and lateral fluoroscopic projection used confirm needle placement in all planes. Images permanently stored in EMR. Interpretation: I personally interpreted the imaging  intraoperatively. Adequate needle placement confirmed in multiple planes. Appropriate spread of contrast into desired area was observed. No evidence of afferent or efferent intravascular uptake. No intrathecal or subarachnoid spread observed. Permanent images saved into the patient's record.  Antibiotic Prophylaxis:   Anti-infectives (From admission, onward)   None     Indication(s): None identified  Post-operative Assessment:  Post-procedure Vital Signs:  Pulse/HCG Rate: 97  Temp: (!) 97.2 F (36.2 C) Resp: 18 BP: (!) 162/98 SpO2: 99 %  EBL: None  Complications: No immediate post-treatment complications observed by team, or reported by patient.  Note: The patient tolerated the entire procedure well. A repeat set of vitals were taken after the procedure and the patient was kept under observation following institutional policy, for this type of procedure. Post-procedural neurological assessment was performed, showing return to baseline, prior to discharge. The patient was provided with post-procedure discharge instructions, including a section on how to identify potential problems. Should any problems arise concerning this procedure, the patient was given instructions to immediately contact , at any time, without hesitation. In any case, we plan to contact the patient by telephone for a follow-up status report regarding this interventional procedure.  Comments:  No additional relevant information.  Plan of Care  Orders:  Orders Placed This Encounter  Procedures  . DG PAIN CLINIC C-ARM 1-60 MIN NO REPORT    Intraoperative interpretation by procedural physician at [facility].    Standing Status:   Standing    Number of Occurrences:   1    Order Specific Question:   Reason for exam:    Answer:   Assistance in needle guidance and placement for procedures requiring needle placement in or near specific anatomical locations not easily accessible without such assistance.     Medications ordered for procedure: Meds ordered this encounter  Medications  . iohexol (OMNIPAQUE) 180 MG/ML injection 10 mL    Must be Myelogram-compatible. If not available, you may substitute with a water-soluble, non-ionic, hypoallergenic, myelogram-compatible radiological contrast medium.  [facility]  lidocaine (XYLOCAINE) 2 % (with pres) injection 400 mg  . ropivacaine (PF) 2 mg/mL (0.2%) (NAROPIN) injection 2 mL  . sodium chloride flush (NS) 0.9 % injection 2 mL  . dexamethasone (DECADRON) injection 10 mg  . diazepam (VALIUM) tablet 5 mg   Medications administered: We administered iohexol, lidocaine, ropivacaine (PF) 2 mg/mL (0.2%), sodium chloride flush, dexamethasone, and diazepam.  See the medical record for exact dosing, route, and time of administration.  Follow-up plan:   Return in about 4 weeks (around 06/14/2020) for Post Procedure Evaluation, virtual.      Left L3-L4 ESI 05/17/2020   Recent Visits Date Type Provider Dept  04/27/20 Office Visit Gillis Santa, MD [facility]  Showing recent visits within past 90 days and meeting all other requirements Today's Visits Date Type Provider Dept  05/17/20 Procedure visit Gillis Santa, MD [facility]  Showing today's visits and meeting all other requirements Future Appointments Date Type Provider Dept  06/13/20 Appointment Gillis Santa, MD [facility]  Showing future appointments within next 90 days and meeting all other requirements  Disposition: Discharge home  Discharge (Date  Time): 05/17/2020; 1035 hrs.   Primary Care Physician: Margo Common, PA-C Location: [facility] Note by: Gillis Santa, MD Date: 05/17/2020; Time: 11:03 AM  Disclaimer:  Medicine is not an exact science. The only guarantee in medicine is that nothing is guaranteed. It is important to note that the decision to proceed with this intervention was based on the information collected from the  patient. The Data and conclusions were drawn from the patient's questionnaire, the interview, and the physical examination. Because the information was provided in large part by the patient, it cannot be guaranteed that it has not been purposely or unconsciously manipulated. Every effort has been made to obtain as much relevant data as possible for this evaluation. It is important to note that the conclusions that lead to this procedure are derived in large part from the available data. Always take into account that the treatment will also be dependent on availability of resources and existing treatment guidelines, considered by other Pain Management Practitioners as being common knowledge and practice, at the time of the intervention. For Medico-Legal purposes, it is also important to point out that variation in procedural techniques and pharmacological choices are the acceptable norm. The indications, contraindications, technique, and results of the above procedure should only be interpreted and judged by a Board-Certified Interventional Pain Specialist with extensive familiarity and expertise in the same exact procedure and technique.

## 2020-05-18 ENCOUNTER — Telehealth: Payer: Self-pay | Admitting: *Deleted

## 2020-05-18 NOTE — Telephone Encounter (Signed)
Called for post procedure check. Denies any problems.

## 2020-05-29 ENCOUNTER — Encounter: Payer: Self-pay | Admitting: Family Medicine

## 2020-05-29 ENCOUNTER — Ambulatory Visit: Payer: Self-pay | Admitting: *Deleted

## 2020-05-29 NOTE — Telephone Encounter (Addendum)
Called patient back to recheck blood glucose. Patient reports she just checked blood glucose for 359. Patient is continuing to drink water and monitor blood glucose. Reports she is better but blood glucose is taking extra time to come down. Patient is going to take her prescribed metformin and recheck blood glucose. Instructed patient to call back or go to ED or call EMS if symptoms worsen. Patient reports she will call back to clinic in am. On call provider, Dr. Ancil Boozer, called and left message to call back regarding elevated blood glucose.  Reason for Disposition . [1] Blood glucose > 300 mg/dL (16.7 mmol/L) AND [2] two or more times in a row  Protocols used: DIABETES - HIGH BLOOD SUGAR-A-AH  Patient states symptoms of polydipsia, blurred vision, polyuria and general malaise has been present since having an epidural injection with Dexamethasone for lumbar radiculopathy on 05-18-20. After taking Metformin 500 mg this morning, BS dropped to 442. She is feeling better and a few minutes ago BS was 359. Encouraged to drink extra water and take an extra dose of 500 mg this evening. She will get back on the Metformin 500 mg 2 tablets BID and call report of FBS in the morning. If it goes back up to >350-400, will need to go to the ER this evening. May need to go up to Metformin 500 mg 2 breakfast, 1 lunch and 2 in the evening.

## 2020-05-29 NOTE — Telephone Encounter (Signed)
Patient c/o elevated blood glucose yesterday of 600, blurred vision and frequent urination, dry mouth and dizziness. Patient reports she has developed white patches in her mouth and on her tongue and gums hard to eat and painful. Rechecked blood glucose for 388 this am. Patient rechecked her blood glucose while taking to NT for 442. Patient did not take am dose of metformin because she was afraid to eat and elevate her blood glucose more. Instructed patient to eat low carb meal and take metformin now. Increase water intake and if no dizziness after eating walk around house as tolerated. Recheck her blood glucose within 30 minutes after taking medication and walking and call back. Care advise given. Patient verbalized understanding of care advise and to call back or call 911 is symptoms worsen. Please advise of further instructions for patient at this time. Office will not open until after 1 pm due to weather.    Reason for Disposition . Blood glucose > 400 mg/dL (22.2 mmol/L)  Answer Assessment - Initial Assessment Questions 1. BLOOD GLUCOSE: "What is your blood glucose level?"      388 and patient has not taken metformin this am. 2. ONSET: "When did you check the blood glucose?"     15 minutes ago 3. USUAL RANGE: "What is your glucose level usually?" (e.g., usual fasting morning value, usual evening value)     130's 4. KETONES: "Do you check for ketones (urine or blood test strips)?" If yes, ask: "What does the test show now?"      no 5. TYPE 1 or 2:  "Do you know what type of diabetes you have?"  (e.g., Type 1, Type 2, Gestational; doesn't know)      Not sure  6. INSULIN: "Do you take insulin?" "What type of insulin(s) do you use? What is the mode of delivery? (syringe, pen; injection or pump)?"      no 7. DIABETES PILLS: "Do you take any pills for your diabetes?" If yes, ask: "Have you missed taking any pills recently?"     metfromin 8. OTHER SYMPTOMS: "Do you have any symptoms?" (e.g.,  fever, frequent urination, difficulty breathing, dizziness, weakness, vomiting)     Frequent urination, dry mouth and increased thirst and dizziness 9. PREGNANCY: "Is there any chance you are pregnant?" "When was your last menstrual period?"     na  Protocols used: DIABETES - HIGH BLOOD SUGAR-A-AH

## 2020-05-30 NOTE — Telephone Encounter (Signed)
Added an addendum to the message above:  Patient states symptoms of polydipsia, blurred vision, polyuria and general malaise has been present since having an epidural injection with Dexamethasone for lumbar radiculopathy on 05-18-20. After taking Metformin 500 mg this morning, BS dropped to 442. She is feeling better and a few minutes ago BS was 359. Encouraged to drink extra water and take an extra dose of 500 mg this evening. She will get back on the Metformin 500 mg 2 tablets BID and call report of FBS in the morning. If it goes back up to >350-400, will need to go to the ER this evening. May need to go up to Metformin 500 mg 2 breakfast, 1 lunch and 2 in the evening.

## 2020-05-30 NOTE — Telephone Encounter (Signed)
Please advise 

## 2020-06-01 ENCOUNTER — Telehealth: Payer: Self-pay

## 2020-06-01 NOTE — Telephone Encounter (Signed)
Copied from [place] [id number]. Topic: Quick Communication - See Telephone Encounter >> May 31, 2020  4:05 PM Loma Boston wrote: CRM for notification. See Telephone encounter for: 05/31/20. Pt was to call Dr C about BS 269  am fasting  @ 3:30 after eating 239. FU [phone] /Pt also wants him to know her handicapped sticker has expired.

## 2020-06-01 NOTE — Telephone Encounter (Signed)
Blood sugar is improving. Review the nurse note from 05-29-20 for additional advice and should schedule follow up appointment with fasting labs next week.

## 2020-06-01 NOTE — Telephone Encounter (Signed)
Tried calling patient. Left message to call back. OK for [facility] triage to advise patient and schedule appointment.

## 2020-06-05 ENCOUNTER — Ambulatory Visit: Payer: Self-pay | Admitting: Family Medicine

## 2020-06-05 NOTE — Telephone Encounter (Signed)
Rec'd call from pt.  Reported fasting blood sugar of  300 this morning.  Stated the fasting readings have ranged 177-335 over past week.  Reported pre-prandial readings of 130-225.  Stated she is not feeling quite as good as she was feeling; c/o being more tired and having intermittent blurring of vision.  Stated some improvement in freq. Urination and dizziness, since her sugars are not as high as they were a few weeks ago. (in the 600's)  Today, wanted PCP to be aware that her blood sugars are starting to go back up.  Taking Metformin on schedule.    Per Telephone note of 06/01/20, pt. Is to schedule f/u appt. And fasting labs this week.  Scheduled appt. for 1/25 with PCP.  Pt. Knows to be fasting for the appt.  Care advice given per protocol.  Pt. Verb. Understanding.     Reason for Disposition . [1] Blood glucose > 300 mg/dL (16.7 mmol/L) AND [2] does not  use insulin (e.g., not insulin-dependent; most people with type 2 diabetes)  Answer Assessment - Initial Assessment Questions 1. BLOOD GLUCOSE: "What is your blood glucose level?"      300, fasting at 8:00 AM 2. ONSET: "When did you check the blood glucose?"     Today  3. USUAL RANGE: "What is your glucose level usually?" (e.g., usual fasting morning value, usual evening value)     177- 335 fasting;  Blood sugars before meals 130-225 4. KETONES: "Do you check for ketones (urine or blood test strips)?" If yes, ask: "What does the test show now?"      no 5. TYPE 1 or 2:  "Do you know what type of diabetes you have?"  (e.g., Type 1, Type 2, Gestational; doesn't know)      Type 2 6. INSULIN: "Do you take insulin?" "What type of insulin(s) do you use? What is the mode of delivery? (syringe, pen; injection or pump)?"      no 7. DIABETES PILLS: "Do you take any pills for your diabetes?" If yes, ask: "Have you missed taking any pills recently?"     No- taking Metformin bid 8. OTHER SYMPTOMS: "Do you have any symptoms?" (e.g., fever, frequent  urination, difficulty breathing, dizziness, weakness, vomiting)    Freq, urination and dizziness in recent past, but has improved since her blood sugars are not as high as they had been; stated not feeling as good; more tired; blurring of vision comes and goes.  Denied nausea/ vomiting.  Denied shortness of breath. 9. PREGNANCY: "Is there any chance you are pregnant?" "When was your last menstrual period?"     N/a  Protocols used: DIABETES - HIGH BLOOD SUGAR-A-AH

## 2020-06-05 NOTE — Telephone Encounter (Signed)
Follow up appointment scheduled for 06/06/2020.

## 2020-06-06 ENCOUNTER — Ambulatory Visit (INDEPENDENT_AMBULATORY_CARE_PROVIDER_SITE_OTHER): Payer: Medicare Other | Admitting: Family Medicine

## 2020-06-06 ENCOUNTER — Encounter: Payer: Self-pay | Admitting: Family Medicine

## 2020-06-06 ENCOUNTER — Ambulatory Visit: Payer: Self-pay | Admitting: Family Medicine

## 2020-06-06 ENCOUNTER — Other Ambulatory Visit: Payer: Self-pay

## 2020-06-06 VITALS — BP 138/84 | HR 72 | Temp 99.3°F | Ht 69.0 in | Wt 226.0 lb

## 2020-06-06 DIAGNOSIS — E1165 Type 2 diabetes mellitus with hyperglycemia: Secondary | ICD-10-CM

## 2020-06-06 LAB — POCT URINALYSIS DIPSTICK
Appearance: NORMAL
Bilirubin, UA: NEGATIVE
Glucose, UA: NEGATIVE
Ketones, UA: NEGATIVE
Nitrite, UA: NEGATIVE
Protein, UA: POSITIVE — AB
Spec Grav, UA: 1.025 (ref 1.010–1.025)
Urobilinogen, UA: NEGATIVE E.U./dL — AB
pH, UA: 6 (ref 5.0–8.0)

## 2020-06-06 LAB — POCT UA - MICROALBUMIN: Microalbumin Ur, POC: 50 mg/L

## 2020-06-06 NOTE — Progress Notes (Signed)
Acute Office Visit  Subjective:    Patient ID: Emily Stokes, female    DOB: 15-Jul-1960, 60 y.o.   MRN: [id number]  No chief complaint on file.   HPI Patient is in today for elevated blood sugar after procedure involving an epidural and nerve block x few weeks ago.  Pt reports levels of over 600 but says that on average her blood sugar level has been in the 200-300s. Pt reports her blood sugar to be higher in the morning.   Past Medical History:  Diagnosis Date   Abdominal pain, epigastric    Anemia    Backache    Bronchitis    Calculus, kidney    Carpal tunnel syndrome    Chest pain    Chicken pox    Circulatory disease    Diabetes mellitus without complication (HCC)    Disorder of kidney and ureter    Excess, menstruation    Hypercholesteremia    Hypertension, essential, benign    Infected postoperative seroma    Iron deficiency    Malaise and fatigue    Measles    MRSA (methicillin resistant staph aureus) culture positive    Nausea alone    Nonspecific abnormal electrocardiogram (ECG) (EKG)    Pain in joint, lower leg     Past Surgical History:  Procedure Laterality Date   ABDOMINAL HYSTERECTOMY     CESAREAN SECTION     COLONOSCOPY  1997   COLONOSCOPY WITH PROPOFOL N/A 01/09/2018   Procedure: COLONOSCOPY WITH PROPOFOL;  Surgeon: Virgel Manifold, MD;  Location: [facility] ENDOSCOPY;  Service: Endoscopy;  Laterality: N/A;   COLONOSCOPY WITH PROPOFOL N/A 08/20/2019   Procedure: COLONOSCOPY WITH PROPOFOL;  Surgeon: Lucilla Lame, MD;  Location: [facility] ENDOSCOPY;  Service: Endoscopy;  Laterality: N/A;   COLOSTOMY TAKEDOWN  2012   Hartmann's procedure.     HERNIA REPAIR  [id number]   umbilical   LITHOTRIPSY  [id number]   with complications, hospitalized due to these complications for 3 months   mrsa     removal on nose   PARTIAL HYSTERECTOMY  2009    vaginal hysterectomy, has both ovaries    Family History  Problem Relation Age of Onset   Hypertension Father    Stroke  Father    Diabetes Mother    Hypertension Mother    Diabetes Other        sibling   Diabetes Other        sibling   Diabetes Other        sibling   Hypertension Other        sibling   Hypertension Other        sibling   Hypertension Other        sibling    Social History   Socioeconomic History   Marital status: Widowed    Spouse name: Not on file   Number of children: 4   Years of education: Not on file   Highest education level: Bachelor's degree (e.g., BA, AB, BS)  Occupational History   Occupation: Control and instrumentation engineer    Comment: disability  Tobacco Use   Smoking status: Never Smoker   Smokeless tobacco: Never Used  Vaping Use   Vaping Use: Never used  Substance and Sexual Activity   Alcohol use: No    Alcohol/week: 0.0 standard drinks   Drug use: No   Sexual activity: Never  Other Topics Concern   Not on file  Social History Narrative   Not on  file   Social Determinants of Health   Financial Resource Strain: Low Risk    Difficulty of Paying Living Expenses: Not hard at all  Food Insecurity: No Food Insecurity   Worried About Charity fundraiser in the Last Year: Never true   Arboriculturist in the Last Year: Never true  Transportation Needs: No Transportation Needs   Lack of Transportation (Medical): No   Lack of Transportation (Non-Medical): No  Physical Activity: Inactive   Days of Exercise per Week: 0 days   Minutes of Exercise per Session: 0 min  Stress: No Stress Concern Present   Feeling of Stress : Only a little  Social Connections: Moderately Isolated   Frequency of Communication with Friends and Family: More than three times a week   Frequency of Social Gatherings with Friends and Family: More than three times a week   Attends Religious Services: More than 4 times per year   Active Member of [employer] or Organizations: No   Attends Archivist Meetings: Never   Marital Status: Widowed  Human resources officer Violence: Not At Risk   Fear of  Current or Ex-Partner: No   Emotionally Abused: No   Physically Abused: No   Sexually Abused: No    Outpatient Medications Prior to Visit  Medication Sig Dispense Refill   acetaminophen (TYLENOL) 500 MG tablet Take 500 mg by mouth every 6 (six) hours as needed.     amLODipine (NORVASC) 10 MG tablet Take 1 tablet (10 mg total) by mouth daily. 90 tablet 1   aspirin 81 MG tablet Take 81 mg by mouth daily.     atorvastatin (LIPITOR) 20 MG tablet Take 1 tablet (20 mg total) by mouth daily. 90 tablet 3   benzonatate (TESSALON) 100 MG capsule Take 1 capsule (100 mg total) by mouth 3 (three) times daily as needed for cough. 21 capsule 0   ciprofloxacin (CIPRO) 500 MG tablet Take 1 tablet (500 mg total) by mouth 2 (two) times daily. 20 tablet 0   gabapentin (NEURONTIN) 300 MG capsule Take 1 capsule (300 mg total) by mouth 2 (two) times daily. 60 capsule 0   glucose blood (ONETOUCH VERIO) test strip USE TO TEST BLOOD SUGAR BEFORE BREAKFAST - RECHECK IF ANY SYMTOMPS OF HYPOGLYCEMIA 100 each 3   Ibuprofen-diphenhydrAMINE Cit (IBUPROFEN PM PO) Take by mouth at bedtime as needed.     Lancets (ONETOUCH DELICA PLUS [id number]) MISC USE AS DIRECTED TWICE DAILY IN THE MORNING AND AT BEDTIME 100 each 3   metFORMIN (GLUCOPHAGE) 500 MG tablet TAKE TWO (2) TABLETS BY MOUTH TWICE DAILY WITH MEALS 120 tablet 1   metoprolol tartrate (LOPRESSOR) 25 MG tablet TAKE 1 TABLET BY MOUTH TWICE DAILY 60 tablet 1   No facility-administered medications prior to visit.    Allergies  Allergen Reactions   Bactrim [Sulfamethoxazole-Trimethoprim] Swelling    Review of Systems  Constitutional: Positive for fatigue.  HENT: Negative.   Eyes:       Some blurring of vision.  Respiratory: Negative.   Cardiovascular: Negative.   Genitourinary: Positive for frequency.       Objective:   Physical Exam Constitutional:      General: She is not in acute distress.    Appearance: She is well-developed and well-nourished.   HENT:     Head: Normocephalic and atraumatic.     Right Ear: Hearing normal.     Left Ear: Hearing normal.     Nose: Nose normal.  Eyes:  General: Lids are normal. No scleral icterus.       Right eye: No discharge.        Left eye: No discharge.     Conjunctiva/sclera: Conjunctivae normal.  Pulmonary:     Effort: Pulmonary effort is normal. No respiratory distress.  Musculoskeletal:        General: Normal range of motion.  Skin:    General: Skin is intact.     Findings: No lesion or rash.  Neurological:     Mental Status: She is alert and oriented to person, place, and time.  Psychiatric:        Mood and Affect: Mood and affect normal.        Speech: Speech normal.        Behavior: Behavior normal.        Thought Content: Thought content normal.    Diabetic Foot Form - Detailed   Diabetic Foot Exam - detailed Diabetic Foot exam was performed with the following findings: Yes 06/06/2020 10:20 AM  Visual Foot Exam completed.: Yes  Can the patient see the bottom of their feet?: Yes Are the shoes appropriate in style and fit?: Yes Is there swelling or and abnormal foot shape?: No Is there a claw toe deformity?: No Is there elevated skin temparature?: No Is there foot or ankle muscle weakness?: No Normal Range of Motion: Yes Pulse Foot Exam completed.: Yes   Right posterior Tibialias: Present Left posterior Tibialias: Present   Right Dorsalis Pedis: Present Left Dorsalis Pedis: Present  Semmes-Weinstein Monofilament Test R Site 1-Great Toe: Pos L Site 1-Great Toe: Pos           BP 138/84 (BP Location: Left Arm, Patient Position: Sitting, Cuff Size: Large)    Pulse 72    Temp 99.3 F (37.4 C) (Oral)    Ht 5\' 9"  (1.753 m)    Wt 226 lb (102.5 kg)    SpO2 98%    BMI 33.37 kg/m  Wt Readings from Last 3 Encounters:  05/17/20 233 lb (105.7 kg)  05/09/20 226 lb 9.6 oz (102.8 kg)  04/27/20 238 lb (108 kg)    Health Maintenance Due  Topic Date Due   URINE  MICROALBUMIN  01/31/2019   HEMOGLOBIN A1C  01/28/2020   COVID-19 Vaccine (3 - Booster for Pfizer series) 03/01/2020    There are no preventive care reminders to display for this patient.   Lab Results  Component Value Date   TSH 1.500 07/28/2019   Lab Results  Component Value Date   WBC 6.0 07/28/2019   HGB 11.7 07/28/2019   HCT 35.1 07/28/2019   MCV 91 07/28/2019   PLT 374 07/28/2019   Lab Results  Component Value Date   NA 143 07/28/2019   K 4.1 07/28/2019   CO2 21 07/28/2019   GLUCOSE 179 (H) 07/28/2019   BUN 18 07/28/2019   CREATININE 1.04 (H) 07/28/2019   BILITOT 0.4 07/28/2019   ALKPHOS 114 07/28/2019   AST 17 07/28/2019   ALT 16 07/28/2019   PROT 7.4 07/28/2019   ALBUMIN 4.4 07/28/2019   CALCIUM 9.4 07/28/2019   ANIONGAP 10 07/07/2017   Lab Results  Component Value Date   CHOL 163 07/28/2019   Lab Results  Component Value Date   HDL 40 07/28/2019   Lab Results  Component Value Date   LDLCALC 107 (H) 07/28/2019   Lab Results  Component Value Date   TRIG 82 07/28/2019   Lab Results  Component Value Date  CHOLHDL 5.3 (H) 04/20/2019   Lab Results  Component Value Date   HGBA1C 7.9 (H) 07/28/2019       Assessment & Plan:   1. Uncontrolled type 2 diabetes mellitus with hyperglycemia (HCC) Last Hgb A1C was 7.9 on 07-28-19. Recent increase in FBS  probably due to steroid injection. Still taking Metformin 1000 mg BID. Urine microalbumin up to 50 mg/L and urinalysis showed moderate leukocytes without glucose. Will get follow up labs and watch diet closely. Exercise and increase in water intake will help. Recheck pending lab reports. - POCT UA - Microalbumin - Hemoglobin A1c - Comprehensive metabolic panel   Sylvester Harder, CMA  I, Elvan Ebron, PA-C, have reviewed all documentation for this visit. The documentation on 02/04/21 for the exam, diagnosis, procedures, and orders are all accurate and complete.

## 2020-06-06 NOTE — Patient Instructions (Signed)
Diabetes Mellitus and Nutrition, Adult When you have diabetes, or diabetes mellitus, it is very important to have healthy eating habits because your blood sugar (glucose) levels are greatly affected by what you eat and drink. Eating healthy foods in the right amounts, at about the same times every day, can help you:  Control your blood glucose.  Lower your risk of heart disease.  Improve your blood pressure.  Reach or maintain a healthy weight. What can affect my meal plan? Every person with diabetes is different, and each person has different needs for a meal plan. Your health care provider may recommend that you work with a dietitian to make a meal plan that is best for you. Your meal plan may vary depending on factors such as:  The calories you need.  The medicines you take.  Your weight.  Your blood glucose, blood pressure, and cholesterol levels.  Your activity level.  Other health conditions you have, such as heart or kidney disease. How do carbohydrates affect me? Carbohydrates, also called carbs, affect your blood glucose level more than any other type of food. Eating carbs naturally raises the amount of glucose in your blood. Carb counting is a method for keeping track of how many carbs you eat. Counting carbs is important to keep your blood glucose at a healthy level, especially if you use insulin or take certain oral diabetes medicines. It is important to know how many carbs you can safely have in each meal. This is different for every person. Your dietitian can help you calculate how many carbs you should have at each meal and for each snack. How does alcohol affect me? Alcohol can cause a sudden decrease in blood glucose (hypoglycemia), especially if you use insulin or take certain oral diabetes medicines. Hypoglycemia can be a life-threatening condition. Symptoms of hypoglycemia, such as sleepiness, dizziness, and confusion, are similar to symptoms of having too much  alcohol.  Do not drink alcohol if: ? Your health care provider tells you not to drink. ? You are pregnant, may be pregnant, or are planning to become pregnant.  If you drink alcohol: ? Do not drink on an empty stomach. ? Limit how much you use to:  0-1 drink a day for women.  0-2 drinks a day for men. ? Be aware of how much alcohol is in your drink. In the [place] one drink equals one 12 oz bottle of beer (355 mL), one 5 oz glass of wine (148 mL), or one 1 oz glass of hard liquor (44 mL). ? Keep yourself hydrated with water, diet soda, or unsweetened iced tea.  Keep in mind that regular soda, juice, and other mixers may contain a lot of sugar and must be counted as carbs. What are tips for following this plan? Reading food labels  Start by checking the serving size on the "Nutrition Facts" label of packaged foods and drinks. The amount of calories, carbs, fats, and other nutrients listed on the label is based on one serving of the item. Many items contain more than one serving per package.  Check the total grams (g) of carbs in one serving. You can calculate the number of servings of carbs in one serving by dividing the total carbs by 15. For example, if a food has 30 g of total carbs per serving, it would be equal to 2 servings of carbs.  Check the number of grams (g) of saturated fats and trans fats in one serving. Choose foods that have   a low amount or none of these fats.  Check the number of milligrams (mg) of salt (sodium) in one serving. Most people should limit total sodium intake to less than 2,300 mg per day.  Always check the nutrition information of foods labeled as "low-fat" or "nonfat." These foods may be higher in added sugar or refined carbs and should be avoided.  Talk to your dietitian to identify your daily goals for nutrients listed on the label. Shopping  Avoid buying canned, pre-made, or processed foods. These foods tend to be high in fat, sodium, and added  sugar.  Shop around the outside edge of the grocery store. This is where you will most often find fresh fruits and vegetables, bulk grains, fresh meats, and fresh dairy. Cooking  Use low-heat cooking methods, such as baking, instead of high-heat cooking methods like deep frying.  Cook using healthy oils, such as olive, canola, or sunflower oil.  Avoid cooking with butter, cream, or high-fat meats. Meal planning  Eat meals and snacks regularly, preferably at the same times every day. Avoid going long periods of time without eating.  Eat foods that are high in fiber, such as fresh fruits, vegetables, beans, and whole grains. Talk with your dietitian about how many servings of carbs you can eat at each meal.  Eat 4-6 oz (112-168 g) of lean protein each day, such as lean meat, chicken, fish, eggs, or tofu. One ounce (oz) of lean protein is equal to: ? 1 oz (28 g) of meat, chicken, or fish. ? 1 egg. ?  cup (62 g) of tofu.  Eat some foods each day that contain healthy fats, such as avocado, nuts, seeds, and fish.   What foods should I eat? Fruits Berries. Apples. Oranges. Peaches. Apricots. Plums. Grapes. Mango. Papaya. Pomegranate. Kiwi. Cherries. Vegetables Lettuce. Spinach. Leafy greens, including kale, chard, collard greens, and mustard greens. Beets. Cauliflower. Cabbage. Broccoli. Carrots. Green beans. Tomatoes. Peppers. Onions. Cucumbers. Brussels sprouts. Grains Whole grains, such as whole-wheat or whole-grain bread, crackers, tortillas, cereal, and pasta. Unsweetened oatmeal. Quinoa. Brown or wild rice. Meats and other proteins Seafood. Poultry without skin. Lean cuts of poultry and beef. Tofu. Nuts. Seeds. Dairy Low-fat or fat-free dairy products such as milk, yogurt, and cheese. The items listed above may not be a complete list of foods and beverages you can eat. Contact a dietitian for more information. What foods should I avoid? Fruits Fruits canned with  syrup. Vegetables Canned vegetables. Frozen vegetables with butter or cream sauce. Grains Refined white flour and flour products such as bread, pasta, snack foods, and cereals. Avoid all processed foods. Meats and other proteins Fatty cuts of meat. Poultry with skin. Breaded or fried meats. Processed meat. Avoid saturated fats. Dairy Full-fat yogurt, cheese, or milk. Beverages Sweetened drinks, such as soda or iced tea. The items listed above may not be a complete list of foods and beverages you should avoid. Contact a dietitian for more information. Questions to ask a health care provider  Do I need to meet with a diabetes educator?  Do I need to meet with a dietitian?  What number can I call if I have questions?  When are the best times to check my blood glucose? Where to find more information:  American Diabetes Association: diabetes.org  Academy of Nutrition and Dietetics: www.eatright.org  National Institute of Diabetes and Digestive and Kidney Diseases: www.niddk.nih.gov  Association of Diabetes Care and Education Specialists: www.diabeteseducator.org Summary  It is important to have healthy eating   habits because your blood sugar (glucose) levels are greatly affected by what you eat and drink.  A healthy meal plan will help you control your blood glucose and maintain a healthy lifestyle.  Your health care provider may recommend that you work with a dietitian to make a meal plan that is best for you.  Keep in mind that carbohydrates (carbs) and alcohol have immediate effects on your blood glucose levels. It is important to count carbs and to use alcohol carefully. This information is not intended to replace advice given to you by your health care provider. Make sure you discuss any questions you have with your health care provider. Document Revised: 04/06/2019 Document Reviewed: 04/06/2019 Elsevier Patient Education  2021 Elsevier Inc.  

## 2020-06-07 LAB — HEMOGLOBIN A1C
Est. average glucose Bld gHb Est-mCnc: 381 mg/dL
Hgb A1c MFr Bld: 14.9 % — ABNORMAL HIGH (ref 4.8–5.6)

## 2020-06-07 LAB — COMPREHENSIVE METABOLIC PANEL
ALT: 25 IU/L (ref 0–32)
AST: 31 IU/L (ref 0–40)
Albumin/Globulin Ratio: 1.2 (ref 1.2–2.2)
Albumin: 4 g/dL (ref 3.8–4.9)
Alkaline Phosphatase: 116 IU/L (ref 44–121)
BUN/Creatinine Ratio: 11 — ABNORMAL LOW (ref 12–28)
BUN: 12 mg/dL (ref 8–27)
Bilirubin Total: 0.4 mg/dL (ref 0.0–1.2)
CO2: 19 mmol/L — ABNORMAL LOW (ref 20–29)
Calcium: 9.4 mg/dL (ref 8.7–10.3)
Chloride: 104 mmol/L (ref 96–106)
Creatinine, Ser: 1.1 mg/dL — ABNORMAL HIGH (ref 0.57–1.00)
GFR calc Af Amer: 63 mL/min/{1.73_m2} (ref >59)
GFR calc non Af Amer: 55 mL/min/{1.73_m2} — ABNORMAL LOW (ref >59)
Globulin, Total: 3.4 g/dL (ref 1.5–4.5)
Glucose: 183 mg/dL — ABNORMAL HIGH (ref 65–99)
Potassium: 4.3 mmol/L (ref 3.5–5.2)
Sodium: 140 mmol/L (ref 134–144)
Total Protein: 7.4 g/dL (ref 6.0–8.5)

## 2020-06-13 ENCOUNTER — Telehealth: Payer: Self-pay

## 2020-06-13 ENCOUNTER — Other Ambulatory Visit: Payer: Self-pay

## 2020-06-13 ENCOUNTER — Ambulatory Visit
Payer: Medicare Other | Attending: Student in an Organized Health Care Education/Training Program | Admitting: Student in an Organized Health Care Education/Training Program

## 2020-06-13 ENCOUNTER — Encounter: Payer: Self-pay | Admitting: Student in an Organized Health Care Education/Training Program

## 2020-06-13 VITALS — BP 134/81 | HR 78 | Temp 97.2°F | Resp 16 | Ht 69.0 in | Wt 233.0 lb

## 2020-06-13 DIAGNOSIS — M5136 Other intervertebral disc degeneration, lumbar region: Secondary | ICD-10-CM | POA: Insufficient documentation

## 2020-06-13 DIAGNOSIS — M48062 Spinal stenosis, lumbar region with neurogenic claudication: Secondary | ICD-10-CM | POA: Insufficient documentation

## 2020-06-13 DIAGNOSIS — G894 Chronic pain syndrome: Secondary | ICD-10-CM | POA: Diagnosis present

## 2020-06-13 DIAGNOSIS — M5416 Radiculopathy, lumbar region: Secondary | ICD-10-CM | POA: Insufficient documentation

## 2020-06-13 DIAGNOSIS — M47816 Spondylosis without myelopathy or radiculopathy, lumbar region: Secondary | ICD-10-CM | POA: Diagnosis present

## 2020-06-13 MED ORDER — GABAPENTIN 300 MG PO CAPS: 300.0000 mg | ORAL_CAPSULE | Freq: Two times a day (BID) | ORAL | 5 refills | Status: DC

## 2020-06-13 MED ORDER — TRAMADOL HCL 50 MG PO TABS: 50.0000 mg | ORAL_TABLET | Freq: Two times a day (BID) | ORAL | 2 refills | Status: AC | PRN

## 2020-06-13 NOTE — Telephone Encounter (Signed)
Copied from [place] [id number]. Topic: General - Other >> Jun 13, 2020  1:52 PM Keene Breath wrote: Reason for CRM: Patient is requesting another handicap sticker because her sticker has expired.  Please advise and call patient to confirm at [phone] >> Jun 13, 2020  2:14 PM Jill Side wrote: Not our patient

## 2020-06-13 NOTE — Progress Notes (Signed)
Safety precautions to be maintained throughout the outpatient stay will include: orient to surroundings, keep bed in low position, maintain call bell within reach at all times, provide assistance with transfer out of bed and ambulation.  

## 2020-06-13 NOTE — Progress Notes (Signed)
PROVIDER NOTE: Information contained herein reflects review and annotations entered in association with encounter. Interpretation of such information and data should be left to medically-trained personnel. Information provided to patient can be located elsewhere in the medical record under "Patient Instructions". Document created using STT-dictation technology, any transcriptional errors that may result from process are unintentional.    Patient: Emily Stokes  Service Category: E/M  Provider: Bilal Lateef, MD  DOB: 03/28/1961  DOS: 06/13/2020  Specialty: Interventional Pain Management  MRN: [id number]  Setting: Ambulatory outpatient  PCP: Chrismon, Dennis E, PA-C  Type: Established Patient    Referring Provider: Chrismon, Dennis E, PA-C  Location: Office  Delivery: Face-to-face     HPI  Emily Stokes, a 59 y.o. year old female, is here today because of her Lumbar radiculopathy [M54.16]. Emily Stokes's primary complain today is Leg Pain (Left thigh) Last encounter: My last encounter with her was on 05/17/2020. Pertinent problems: Emily Stokes has Arthralgia of lower leg; Lumbar degenerative disc disease; Lumbar radiculopathy; Chronic pain syndrome; Lumbar facet arthropathy; and Lumbar spondylosis on their pertinent problem list. Pain Assessment: Severity of Chronic pain is reported as a 5 /10. Location: Leg Left,Upper/denies. Onset: More than a month ago. Quality: Sharp. Timing: Intermittent (since procedure, no longer constant). Modifying factor(s): procedure. Vitals:  height is 5' 9" (1.753 m) and weight is 233 lb (105.7 kg). Her temporal temperature is 97.2 F (36.2 C) (abnormal). Her blood pressure is 134/81 and her pulse is 78. Her respiration is 16 and oxygen saturation is 98%.   Reason for encounter: both, medication management and post-procedure assessment.     Post-Procedure Evaluation  Procedure (05/17/2020):   Type: Therapeutic Inter-Laminar Epidural Steroid Injection  #1 for 2022   Region: Lumbar Level: L3-4 Level. Laterality: Left     Sedation: Please see nurses note.  Effectiveness during initial hour after procedure(Ultra-Short Term Relief): 100 %  Local anesthetic used: Long-acting (4-6 hours) Effectiveness: Defined as any analgesic benefit obtained secondary to the administration of local anesthetics. This carries significant diagnostic value as to the etiological location, or anatomical origin, of the pain. Duration of benefit is expected to coincide with the duration of the local anesthetic used.  Effectiveness during initial 4-6 hours after procedure(Short-Term Relief): 100 % (X 3 weeks)   Long-term benefit: Defined as any relief past the pharmacologic duration of the local anesthetics.  Effectiveness past the initial 6 hours after procedure(Long-Term Relief): 25 % (can now lift left leg all the way up, could not before procedure, can now put shoes on, could not before)   Current benefits: Defined as benefit that persist at this time.   Analgesia:  20-30% Function: Emily Stokes reports improvement in function ROM: Emily Stokes reports improvement in ROM   Pharmacotherapy Assessment   Analgesic: Tramadol 50 mg  BID prn   Monitoring:  PMP: PDMP reviewed during this encounter.       Pharmacotherapy: No side-effects or adverse reactions reported. Compliance: No problems identified. Effectiveness: Clinically acceptable.  Welborn, Susan, RN  06/13/2020 12:01 PM  Sign when Signing Visit Safety precautions to be maintained throughout the outpatient stay will include: orient to surroundings, keep bed in low position, maintain call bell within reach at all times, provide assistance with transfer out of bed and ambulation.     UDS:  Summary  Date Value Ref Range Status  10/20/2019 Note  Final    Comment:    ==================================================================== [facility] 13  (MW) ==================================================================== Test                               Result       Flag       Units  Drug Absent but Declared for Prescription Verification   Hydrocodone                    Not Detected UNEXPECTED ng/mg creat ==================================================================== Test                      Result    Flag   Units      Ref Range   Creatinine              122              mg/dL      >=20 ==================================================================== Declared Medications:  The flagging and interpretation on this report are based on the  following declared medications.  Unexpected results may arise from  inaccuracies in the declared medications.   **Note: The testing scope of this panel includes these medications:   Hydrocodone (Norco)   **Note: The testing scope of this panel does not include the  following reported medications:   Acetaminophen (Norco)  Amlodipine (Norvasc)  Aspirin  Atorvastatin (Lipitor)  Brompheniramine  Dextromethorphan  Gabapentin (Neurontin)  Metformin (Glucophage)  Metoprolol (Lopressor)  Pseudoephedrine ==================================================================== For clinical consultation, please call [phone]. ====================================================================      ROS  Constitutional: Denies any fever or chills Gastrointestinal: No reported hemesis, hematochezia, vomiting, or acute GI distress Musculoskeletal: Low back, left leg pain, improved after epidural steroid injection Neurological: No reported episodes of acute onset apraxia, aphasia, dysarthria, agnosia, amnesia, paralysis, loss of coordination, or loss of consciousness  Medication Review  Ibuprofen-diphenhydrAMINE Cit, OneTouch Delica Plus Lancet30G, acetaminophen, amLODipine, aspirin, atorvastatin, benzonatate, ciprofloxacin, gabapentin, glucose blood, metFORMIN, metoprolol tartrate,  and traMADol  History Review  Allergy: Emily Stokes is allergic to bactrim [sulfamethoxazole-trimethoprim]. Drug: Emily Stokes  reports no history of drug use. Alcohol:  reports no history of alcohol use. Tobacco:  reports that she has never smoked. She has never used smokeless tobacco. Social: Emily Stokes  reports that she has never smoked. She has never used smokeless tobacco. She reports that she does not drink alcohol and does not use drugs. Medical:  has a past medical history of Abdominal pain, epigastric, Anemia, Backache, Bronchitis, Calculus, kidney, Carpal tunnel syndrome, Chest pain, Chicken pox, Circulatory disease, Diabetes mellitus without complication (HCC), Disorder of kidney and ureter, Excess, menstruation, Hypercholesteremia, Hypertension, essential, benign, Infected postoperative seroma, Iron deficiency, Malaise and fatigue, Measles, MRSA (methicillin resistant staph aureus) culture positive, Nausea alone, Nonspecific abnormal electrocardiogram (ECG) (EKG), and Pain in joint, lower leg. Surgical: Emily Stokes  has a past surgical history that includes Colonoscopy (1997); Partial hysterectomy (2009); Cesarean section; Lithotripsy (1997); mrsa; Hernia repair (2012); Abdominal hysterectomy; Hartmann's procedure.; Colostomy takedown (2012); Colonoscopy with propofol (N/A, 01/09/2018); and Colonoscopy with propofol (N/A, 08/20/2019). Family: family history includes Diabetes in her mother and other family members; Hypertension in her father, mother, and other family members; Stroke in her father.  Laboratory Chemistry Profile   Renal Lab Results  Component Value Date   BUN 12 06/06/2020   CREATININE 1.10 (H) 06/06/2020   BCR 11 (L) 06/06/2020   GFRAA 63 06/06/2020   GFRNONAA 55 (L) 06/06/2020     Hepatic Lab Results  Component Value Date   AST 31 06/06/2020   ALT 25 06/06/2020   ALBUMIN 4.0 06/06/2020   ALKPHOS 116 06/06/2020   LIPASE 26 07/07/2017     Electrolytes Lab Results     Component Value Date   NA 140 06/06/2020   K 4.3 06/06/2020   CL 104 06/06/2020   CALCIUM 9.4 06/06/2020     Bone No results found for: VD25OH, VD125OH2TOT, VD3125OH2, VD2125OH2, 25OHVITD1, 25OHVITD2, 25OHVITD3, TESTOFREE, TESTOSTERONE   Inflammation (CRP: Acute Phase) (ESR: Chronic Phase) No results found for: CRP, ESRSEDRATE, LATICACIDVEN     Note: Above Lab results reviewed.  Recent Imaging Review  DG PAIN CLINIC C-ARM 1-60 MIN NO REPORT Fluoro was used, but no Radiologist interpretation will be provided.  Please refer to "NOTES" tab for provider progress note. Note: Reviewed        Physical Exam  General appearance: Well nourished, well developed, and well hydrated. In no apparent acute distress Mental status: Alert, oriented x 3 (person, place, & time)       Respiratory: No evidence of acute respiratory distress Eyes: PERLA Vitals: BP 134/81 (BP Location: Right Arm, Patient Position: Sitting, Cuff Size: Large)   Pulse 78   Temp (!) 97.2 F (36.2 C) (Temporal)   Resp 16   Ht 5' 9" (1.753 m)   Wt 233 lb (105.7 kg)   SpO2 98%   BMI 34.41 kg/m  BMI: Estimated body mass index is 34.41 kg/m as calculated from the following:   Height as of this encounter: 5' 9" (1.753 m).   Weight as of this encounter: 233 lb (105.7 kg). Ideal: Ideal body weight: 66.2 kg (145 lb 15.1 oz) Adjusted ideal body weight: 82 kg (180 lb 12.3 oz)   Lumbar Spine Area Exam  Skin & Axial Inspection: No masses, redness, or swelling Alignment: Symmetrical Functional ROM: Pain restricted ROM affecting primarily the left, improved after treatment Stability: No instability detected Muscle Tone/Strength: Functionally intact. No obvious neuro-muscular anomalies detected. Sensory (Neurological): Dermatomal pain pattern left l3, l4, improved after treatment   Gait & Posture Assessment  Ambulation: Unassisted Gait: Relatively normal for age and body habitus Posture: Difficulty standing up straight,  due to pain  Lower Extremity Exam    Side: Right lower extremity  Side: Left lower extremity  Stability: No instability observed          Stability: No instability observed          Skin & Extremity Inspection: Skin color, temperature, and hair growth are WNL. No peripheral edema or cyanosis. No masses, redness, swelling, asymmetry, or associated skin lesions. No contractures.  Skin & Extremity Inspection: Skin color, temperature, and hair growth are WNL. No peripheral edema or cyanosis. No masses, redness, swelling, asymmetry, or associated skin lesions. No contractures.  Functional ROM: Unrestricted ROM                  Functional ROM: Pain restricted ROM for hip joint Limited SLR (straight leg raise)  Muscle Tone/Strength: Functionally intact. No obvious neuro-muscular anomalies detected.  Muscle Tone/Strength: Functionally intact. No obvious neuro-muscular anomalies detected.  Sensory (Neurological): Unimpaired        Sensory (Neurological): Dermatomal pain pattern      , improved after treatment  DTR: Patellar: deferred today Achilles: deferred today Plantar: deferred today  DTR: Patellar: deferred today Achilles: deferred today Plantar: deferred today  Palpation: No palpable anomalies  Palpation: No palpable anomalies     Assessment   Status Diagnosis  Responding Persistent Persistent 1. Lumbar radiculopathy (left L3/4)   2. Spinal stenosis, lumbar region, with neurogenic claudication   3. Lumbar spondylosis   4. Other intervertebral disc degeneration, lumbar region   5. Chronic pain syndrome         Plan of Care   Emily Stokes has a current medication list which includes the following long-term medication(s): amlodipine, atorvastatin, ibuprofen-diphenhydramine cit, metformin, metoprolol tartrate, and gabapentin.  Positive response after previous lumbar epidural steroid injection.  Patient states that her left thigh pain is better and she is able to bear  weight on that left leg more comfortably.  Can repeat lumbar epidural steroid injection as needed.  Unfortunately, it did cause an elevation in her blood sugars which she is still trying to get a better handle of but she is grateful that her left lower extremity pain is much better.  Requesting a refill of her gabapentin and tramadol as below.  Takes tramadol strictly on a as needed basis.  Repeat lumbar epidural steroid injection as needed.  Pharmacotherapy (Medications Ordered): Meds ordered this encounter  Medications  . gabapentin (NEURONTIN) 300 MG capsule    Sig: Take 1 capsule (300 mg total) by mouth 2 (two) times daily.    Dispense:  60 capsule    Refill:  5  . traMADol (ULTRAM) 50 MG tablet    Sig: Take 1 tablet (50 mg total) by mouth every 12 (twelve) hours as needed for severe pain.    Dispense:  60 tablet    Refill:  2    Fill one day early if pharmacy is closed on scheduled refill date. For chronic pain syndrome   Orders:  Orders Placed This Encounter  Procedures  . Lumbar Epidural Injection    Standing Status:   Standing    Number of Occurrences:   9    Standing Expiration Date:   06/13/2021    Scheduling Instructions:     Purpose: Palliative     Indication: Lower extremity pain/Sciatica unspecified side (M54.30).     Side: Midline     Level: TBD     Sedation: Patient's choice.     TIMEFRAME: PRN procedure. (Emily Stokes will call when needed.)    Order Specific Question:   Where will this procedure be performed?    Answer:   [facility]   Follow-up plan:   Return in about 4 months (around 10/11/2020) for Medication Management, in person.     Left L3-L4 ESI 05/17/2020    Recent Visits Date Type Provider Dept  05/17/20 Procedure visit Gillis Santa, MD [facility]  04/27/20 Office Visit Gillis Santa, MD [facility]  Showing recent visits within past 90 days and meeting all other requirements Today's Visits Date Type Provider Dept  06/13/20  Office Visit Gillis Santa, MD [facility]  Showing today's visits and meeting all other requirements Future Appointments No visits were found meeting these conditions. Showing future appointments within next 90 days and meeting all other requirements  I discussed the assessment and treatment plan with the patient. The patient was provided an opportunity to ask questions and all were answered. The patient agreed with the plan and demonstrated an understanding of the instructions.  Patient advised to call back or seek an in-person evaluation if the symptoms or condition worsens.  Duration of encounter:30 minutes.  Note by: Gillis Santa, MD Date: 06/13/2020; Time: 1:21 PM

## 2020-06-13 NOTE — Patient Instructions (Addendum)
Tramadol to last until 09/11/2020 and gabapentin have been escribed to your pharmacy.   Moderate Conscious Sedation, Adult Sedation is the use of medicines to promote relaxation and to relieve discomfort and anxiety. Moderate conscious sedation is a type of sedation. Under moderate conscious sedation, you are less alert than normal, but you are still able to respond to instructions, touch, or both. Moderate conscious sedation is used during short medical and dental procedures. It is milder than deep sedation, which is a type of sedation under which you cannot be easily woken up. It is also milder than general anesthesia, which is the use of medicines to make you unconscious. Moderate conscious sedation allows you to return to your regular activities sooner. Tell a health care provider about:  Any allergies you have.  All medicines you are taking, including vitamins, herbs, eye drops, creams, and over-the-counter medicines.  Any use of steroids. This includes steroids taken by mouth or as a cream.  Any problems you or family members have had with sedatives and anesthetic medicines.  Any blood disorders you have.  Any surgeries you have had.  Any medical conditions you have, such as sleep apnea.  Whether you are pregnant or may be pregnant.  Any use of cigarettes, alcohol, marijuana, or drugs. What are the risks? Generally, this is a safe procedure. However, problems may occur, including:  Getting too much medicine (oversedation).  Nausea.  Allergic reaction to medicines.  Trouble breathing. If this happens, a breathing tube may be used. It will be removed when you are awake and breathing on your own.  Heart trouble.  Lung trouble.  Confusion that gets better with time (emergence delirium). What happens before the procedure? Staying hydrated Follow instructions from your health care provider about hydration, which may include:  Up to 2 hours before the procedure - you may  continue to drink clear liquids, such as water, clear fruit juice, black coffee, and plain tea. Eating and drinking restrictions Follow instructions from your health care provider about eating and drinking, which may include:  8 hours before the procedure - stop eating heavy meals or foods, such as meat, fried foods, or fatty foods.  6 hours before the procedure - stop eating light meals or foods, such as toast or cereal.  6 hours before the procedure - stop drinking milk or drinks that contain milk.  2 hours before the procedure - stop drinking clear liquids. Medicines Ask your health care provider about:  Changing or stopping your regular medicines. This is especially important if you are taking diabetes medicines or blood thinners.  Taking medicines such as aspirin and ibuprofen. These medicines can thin your blood. Do not take these medicines unless your health care provider tells you to take them.  Taking over-the-counter medicines, vitamins, herbs, and supplements. Tests and exams  You will have a physical exam.  You may have blood tests done to show how well: ? Your kidneys and liver work. ? Your blood clots. General instructions  Plan to have a responsible adult take you home from the hospital or clinic.  If you will be going home right after the procedure, plan to have a responsible adult care for you for the time you are told. This is important. What happens during the procedure?  You will be given the sedative. The sedative may be given: ? As a pill that you will swallow. It can also be inserted into the rectum. ? As a spray through the nose. ? As  an injection into the muscle. ? As an injection into the vein through an IV.  You may be given oxygen as needed.  Your breathing, heart rate, and blood pressure will be monitored during the procedure.  The medical or dental procedure will be done. The procedure may vary among health care providers and hospitals.    What happens after the procedure?  Your blood pressure, heart rate, breathing rate, and blood oxygen level will be monitored until you leave the hospital or clinic.  You will get fluids through your IV if needed.  Do not drive or operate machinery until your health care provider says that it is safe. Summary  Sedation is the use of medicines to promote relaxation and to relieve discomfort and anxiety. Moderate conscious sedation is a type of sedation that is used during short medical and dental procedures.  Tell the health care provider about any medical conditions that you have and about all the medicines that you are taking.  You will be given the sedative as a pill, a spray through the nose, an injection into the muscle, or an injection into the vein through an IV. Vital signs are monitored during the sedation.  Moderate conscious sedation allows you to return to your regular activities sooner. This information is not intended to replace advice given to you by your health care provider. Make sure you discuss any questions you have with your health care provider. Document Revised: 08/27/2019 Document Reviewed: 03/25/2019 Elsevier Patient Education  2021 [place]  What are the risk, side effects and possible complications? Generally speaking, most procedures are safe.  However, with any procedure there are risks, side effects, and the possibility of complications.  The risks and complications are dependent upon the sites that are lesioned, or the type of nerve block to be performed.  The closer the procedure is to the spine, the more serious the risks are.  Great care is taken when placing the radio frequency needles, block needles or lesioning probes, but sometimes complications can occur. 1. Infection: Any time there is an injection through the skin, there is a risk of infection.  This is why sterile conditions are used for these blocks.  There are  four possible types of infection. 1. Localized skin infection. 2. Central Nervous System Infection-This can be in the form of Meningitis, which can be deadly. 3. Epidural Infections-This can be in the form of an epidural abscess, which can cause pressure inside of the spine, causing compression of the spinal cord with subsequent paralysis. This would require an emergency surgery to decompress, and there are no guarantees that the patient would recover from the paralysis. 4. Discitis-This is an infection of the intervertebral discs.  It occurs in about 1% of discography procedures.  It is difficult to treat and it may lead to surgery.        2. Pain: the needles have to go through skin and soft tissues, will cause soreness.       3. Damage to internal structures:  The nerves to be lesioned may be near blood vessels or    other nerves which can be potentially damaged.       4. Bleeding: Bleeding is more common if the patient is taking blood thinners such as  aspirin, Coumadin, Ticiid, Plavix, etc., or if he/she have some genetic predisposition  such as hemophilia. Bleeding into the spinal canal can cause compression of the spinal  cord with subsequent paralysis.  This would require an emergency surgery to  decompress and there are no guarantees that the patient would recover from the  paralysis.       5. Pneumothorax:  Puncturing of a lung is a possibility, every time a needle is introduced in  the area of the chest or upper back.  Pneumothorax refers to free air around the  collapsed lung(s), inside of the thoracic cavity (chest cavity).  Another two possible  complications related to a similar event would include: Hemothorax and Chylothorax.   These are variations of the Pneumothorax, where instead of air around the collapsed  lung(s), you may have blood or chyle, respectively.       6. Spinal headaches: They may occur with any procedures in the area of the spine.       7. Persistent CSF (Cerebro-Spinal  Fluid) leakage: This is a rare problem, but may occur  with prolonged intrathecal or epidural catheters either due to the formation of a fistulous  track or a dural tear.       8. Nerve damage: By working so close to the spinal cord, there is always a possibility of  nerve damage, which could be as serious as a permanent spinal cord injury with  paralysis.       9. Death:  Although rare, severe deadly allergic reactions known as "Anaphylactic  reaction" can occur to any of the medications used.      10. Worsening of the symptoms:  We can always make thing worse.  What are the chances of something like this happening? Chances of any of this occuring are extremely low.  By statistics, you have more of a chance of getting killed in a motor vehicle accident: while driving to the hospital than any of the above occurring .  Nevertheless, you should be aware that they are possibilities.  In general, it is similar to taking a shower.  Everybody knows that you can slip, hit your head and get killed.  Does that mean that you should not shower again?  Nevertheless always keep in mind that statistics do not mean anything if you happen to be on the wrong side of them.  Even if a procedure has a 1 (one) in a 1,000,000 (million) chance of going wrong, it you happen to be that one..Also, keep in mind that by statistics, you have more of a chance of having something go wrong when taking medications.  Who should not have this procedure? If you are on a blood thinning medication (e.g. Coumadin, Plavix, see list of "Blood Thinners"), or if you have an active infection going on, you should not have the procedure.  If you are taking any blood thinners, please inform your physician.  How should I prepare for this procedure?  Do not eat or drink anything at least six hours prior to the procedure.  Bring a driver with you .  It cannot be a taxi.  Come accompanied by an adult that can drive you back, and that is strong  enough to help you if your legs get weak or numb from the local anesthetic.  Take all of your medicines the morning of the procedure with just enough water to swallow them.  If you have diabetes, make sure that you are scheduled to have your procedure done first thing in the morning, whenever possible.  If you have diabetes, take only half of your insulin dose and notify our nurse that you have done so as soon  as you arrive at the clinic.  If you are diabetic, but only take blood sugar pills (oral hypoglycemic), then do not take them on the morning of your procedure.  You may take them after you have had the procedure.  Do not take aspirin or any aspirin-containing medications, at least eleven (11) days prior to the procedure.  They may prolong bleeding.  Wear loose fitting clothing that may be easy to take off and that you would not mind if it got stained with Betadine or blood.  Do not wear any jewelry or perfume  Remove any nail coloring.  It will interfere with some of our monitoring equipment.  NOTE: Remember that this is not meant to be interpreted as a complete list of all possible complications.  Unforeseen problems may occur.  BLOOD THINNERS The following drugs contain aspirin or other products, which can cause increased bleeding during surgery and should not be taken for 2 weeks prior to and 1 week after surgery.  If you should need take something for relief of minor pain, you may take acetaminophen which is found in Tylenol,m Datril, Anacin-3 and Panadol. It is not blood thinner. The products listed below are.  Do not take any of the products listed below in addition to any listed on your instruction sheet.  A.P.C or A.P.C with Codeine Codeine Phosphate Capsules #3 Ibuprofen Ridaura  ABC compound Congesprin Imuran rimadil  Advil Cope Indocin Robaxisal  Alka-Seltzer Effervescent Pain Reliever and Antacid Coricidin or Coricidin-D  Indomethacin Rufen  Alka-Seltzer plus Cold  Medicine Cosprin Ketoprofen S-A-C Tablets  Anacin Analgesic Tablets or Capsules Coumadin Korlgesic Salflex  Anacin Extra Strength Analgesic tablets or capsules CP-2 Tablets Lanoril Salicylate  Anaprox Cuprimine Capsules Levenox Salocol  Anexsia-D Dalteparin Magan Salsalate  Anodynos Darvon compound Magnesium Salicylate Sine-off  Ansaid Dasin Capsules Magsal Sodium Salicylate  Anturane Depen Capsules Marnal Soma  APF Arthritis pain formula Dewitt's Pills Measurin Stanback  Argesic Dia-Gesic Meclofenamic Sulfinpyrazone  Arthritis Bayer Timed Release Aspirin Diclofenac Meclomen Sulindac  Arthritis pain formula Anacin Dicumarol Medipren Supac  Analgesic (Safety coated) Arthralgen Diffunasal Mefanamic Suprofen  Arthritis Strength Bufferin Dihydrocodeine Mepro Compound Suprol  Arthropan liquid Dopirydamole Methcarbomol with Aspirin Synalgos  ASA tablets/Enseals Disalcid Micrainin Tagament  Ascriptin Doan's Midol Talwin  Ascriptin A/D Dolene Mobidin Tanderil  Ascriptin Extra Strength Dolobid Moblgesic Ticlid  Ascriptin with Codeine Doloprin or Doloprin with Codeine Momentum Tolectin  Asperbuf Duoprin Mono-gesic Trendar  Aspergum Duradyne Motrin or Motrin IB Triminicin  Aspirin plain, buffered or enteric coated Durasal Myochrisine Trigesic  Aspirin Suppositories Easprin Nalfon Trillsate  Aspirin with Codeine Ecotrin Regular or Extra Strength Naprosyn Uracel  Atromid-S Efficin Naproxen Ursinus  Auranofin Capsules Elmiron Neocylate Vanquish  Axotal Emagrin Norgesic Verin  Azathioprine Empirin or Empirin with Codeine Normiflo Vitamin E  Azolid Emprazil Nuprin Voltaren  Bayer Aspirin plain, buffered or children's or timed BC Tablets or powders Encaprin Orgaran Warfarin Sodium  Buff-a-Comp Enoxaparin Orudis Zorpin  Buff-a-Comp with Codeine Equegesic Os-Cal-Gesic   Buffaprin Excedrin plain, buffered or Extra Strength Oxalid   Bufferin Arthritis Strength Feldene Oxphenbutazone   Bufferin plain or  Extra Strength Feldene Capsules Oxycodone with Aspirin   Bufferin with Codeine Fenoprofen Fenoprofen Pabalate or Pabalate-SF   Buffets II Flogesic Panagesic   Buffinol plain or Extra Strength Florinal or Florinal with Codeine Panwarfarin   Buf-Tabs Flurbiprofen Penicillamine   Butalbital Compound Four-way cold tablets Penicillin   Butazolidin Fragmin Pepto-Bismol   Carbenicillin Geminisyn Percodan   Carna Arthritis Reliever Geopen Persantine  Carprofen Gold's salt Persistin   Chloramphenicol Goody's Phenylbutazone   Chloromycetin Haltrain Piroxlcam   Clmetidine heparin Plaquenil   Cllnoril Hyco-pap Ponstel   Clofibrate Hydroxy chloroquine Propoxyphen         Before stopping any of these medications, be sure to consult the physician who ordered them.  Some, such as Coumadin (Warfarin) are ordered to prevent or treat serious conditions such as "deep thrombosis", "pumonary embolisms", and other heart problems.  The amount of time that you may need off of the medication may also vary with the medication and the reason for which you were taking it.  If you are taking any of these medications, please make sure you notify your pain physician before you undergo any procedures.         Epidural Steroid Injection Patient Information  Description: The epidural space surrounds the nerves as they exit the spinal cord.  In some patients, the nerves can be compressed and inflamed by a bulging disc or a tight spinal canal (spinal stenosis).  By injecting steroids into the epidural space, we can bring irritated nerves into direct contact with a potentially helpful medication.  These steroids act directly on the irritated nerves and can reduce swelling and inflammation which often leads to decreased pain.  Epidural steroids may be injected anywhere along the spine and from the neck to the low back depending upon the location of your pain.   After numbing the skin with local anesthetic (like Novocaine),  a small needle is passed into the epidural space slowly.  You may experience a sensation of pressure while this is being done.  The entire block usually last less than 10 minutes.  Conditions which may be treated by epidural steroids:   Low back and leg pain  Neck and arm pain  Spinal stenosis  Post-laminectomy syndrome  Herpes zoster (shingles) pain  Pain from compression fractures  Preparation for the injection:  1. Do not eat any solid food or dairy products within 8 hours of your appointment.  2. You may drink clear liquids up to 3 hours before appointment.  Clear liquids include water, black coffee, juice or soda.  No milk or cream please. 3. You may take your regular medication, including pain medications, with a sip of water before your appointment  Diabetics should hold regular insulin (if taken separately) and take 1/2 normal NPH dos the morning of the procedure.  Carry some sugar containing items with you to your appointment. 4. A driver must accompany you and be prepared to drive you home after your procedure.  5. Bring all your current medications with your. 6. An IV may be inserted and sedation may be given at the discretion of the physician.   7. A blood pressure cuff, EKG and other monitors will often be applied during the procedure.  Some patients may need to have extra oxygen administered for a short period. 8. You will be asked to provide medical information, including your allergies, prior to the procedure.  We must know immediately if you are taking blood thinners (like Coumadin/Warfarin)  Or if you are allergic to IV iodine contrast (dye). We must know if you could possible be pregnant.  Possible side-effects:  Bleeding from needle site  Infection (rare, may require surgery)  Nerve injury (rare)  Numbness & tingling (temporary)  Difficulty urinating (rare, temporary)  Spinal headache ( a headache worse with upright posture)  Light -headedness  (temporary)  Pain at injection site (several days)  Decreased  blood pressure (temporary)  Weakness in arm/leg (temporary)  Pressure sensation in back/neck (temporary)  Call if you experience:  Fever/chills associated with headache or increased back/neck pain.  Headache worsened by an upright position.  New onset weakness or numbness of an extremity below the injection site  Hives or difficulty breathing (go to the emergency room)  Inflammation or drainage at the infection site  Severe back/neck pain  Any new symptoms which are concerning to you  Please note:  Although the local anesthetic injected can often make your back or neck feel good for several hours after the injection, the pain will likely return.  It takes 3-7 days for steroids to work in the epidural space.  You may not notice any pain relief for at least that one week.  If effective, we will often do a series of three injections spaced 3-6 weeks apart to maximally decrease your pain.  After the initial series, we generally will wait several months before considering a repeat injection of the same type.  If you have any questions, please call [phone] [facility]

## 2020-06-24 ENCOUNTER — Other Ambulatory Visit: Payer: Self-pay | Admitting: Student in an Organized Health Care Education/Training Program

## 2020-07-05 ENCOUNTER — Other Ambulatory Visit: Payer: Self-pay | Admitting: Family Medicine

## 2020-07-05 DIAGNOSIS — I10 Essential (primary) hypertension: Secondary | ICD-10-CM

## 2020-07-06 ENCOUNTER — Other Ambulatory Visit: Payer: Self-pay | Admitting: Family Medicine

## 2020-07-06 DIAGNOSIS — E119 Type 2 diabetes mellitus without complications: Secondary | ICD-10-CM

## 2020-07-06 DIAGNOSIS — I10 Essential (primary) hypertension: Secondary | ICD-10-CM

## 2020-07-13 ENCOUNTER — Telehealth: Payer: Self-pay

## 2020-07-13 NOTE — Telephone Encounter (Signed)
Pt say she need an auth for her Tramadol

## 2020-09-11 ENCOUNTER — Other Ambulatory Visit: Payer: Self-pay | Admitting: Family Medicine

## 2020-09-11 DIAGNOSIS — I10 Essential (primary) hypertension: Secondary | ICD-10-CM

## 2020-09-12 ENCOUNTER — Other Ambulatory Visit: Payer: Self-pay | Admitting: Family Medicine

## 2020-09-12 DIAGNOSIS — E119 Type 2 diabetes mellitus without complications: Secondary | ICD-10-CM

## 2020-09-12 DIAGNOSIS — I10 Essential (primary) hypertension: Secondary | ICD-10-CM

## 2020-10-04 ENCOUNTER — Ambulatory Visit: Payer: Self-pay | Admitting: *Deleted

## 2020-10-04 DIAGNOSIS — Z86010 Personal history of colonic polyps: Secondary | ICD-10-CM | POA: Insufficient documentation

## 2020-10-04 NOTE — Telephone Encounter (Signed)
  Pt called in c/o her right elbow being swollen and very painful.   It's painful to movement it or straighten it out.   There is a bump on the end of her elbow that is sore.   Denies injuries, falls, accidents.  There are no appts available at [facility] with any provider within the 24 hour timeframe indicated on the protocol.   I have suggested she go to an urgent care which she was agreeable to doing.  At the end of the call she mentioned having a UTI.   Should I see them about that too?   I let her know yes to let them know about that too so she could be treated.   Reason for Disposition . Can't move joint normally (bend and straighten completely)  Answer Assessment - Initial Assessment Questions 1. ONSET: "When did the pain start?"     Right elbow started swelling on Friday.   It's stinging like a pin is sticking in it.   It looks like a blister on her elbow.    My elbow is swollen.  I can bend it.   It hurts to touch it and straighten it out.   No injuries or accidents. 2. LOCATION: "Where is the pain located?"     In my right elbow on the side. 3. PAIN: "How bad is the pain?" (Scale 1-10; or mild, moderate, severe)   - MILD (1-3): doesn't interfere with normal activities.   - MODERATE (4-7): interferes with normal activities (e.g., work or school) or awakens from sleep.   - SEVERE (8-10): excruciating pain, unable to do any normal activities, unable to use arm at all.     10 on pain scale    I've taken ibuprofen and Tylenol without relief 4. WORK OR EXERCISE: "Has there been any recent work or exercise that involved this part of the body?"     No  5. CAUSE: "What do you think is causing the elbow pain?"     I don't know     6. OTHER SYMPTOMS: "Do you have any other symptoms?" (e.g., neck pain, elbow swelling, rash, fever)     One little bump on my elbow 7. PREGNANCY: "Is there any chance you are pregnant?" "When was your last menstrual period?"     N/A due to  age  Protocols used: ELBOW PAIN-A-AH

## 2020-10-05 ENCOUNTER — Encounter: Payer: Medicare Other | Admitting: Student in an Organized Health Care Education/Training Program

## 2020-10-09 ENCOUNTER — Other Ambulatory Visit: Payer: Self-pay | Admitting: Family Medicine

## 2020-10-09 DIAGNOSIS — E119 Type 2 diabetes mellitus without complications: Secondary | ICD-10-CM

## 2020-10-10 NOTE — Telephone Encounter (Signed)
Requested medication (s) are due for refill today:   Yes  Requested medication (s) are on the active medication list:   Yes  Future visit scheduled:   No   Message left for her to call and make an appt. For her refills   Last ordered: 09/12/2020 #120, 0 refills   This was a 30 day courtesy refill for May  Returned since 30 day courtesy refill has been given.   Provider to review for additional refills.  Requested Prescriptions  Pending Prescriptions Disp Refills   metFORMIN (GLUCOPHAGE) 500 MG tablet [Pharmacy Med Name: METFORMIN 500 MG TABS 500 Tablet] 120 tablet 10    Sig: TAKE TWO (2) TABLETS BY MOUTH TWICE DAILY WITH MEALS. SCHEDULE FOLLOW UP      Endocrinology:  Diabetes - Biguanides Failed - 10/09/2020 12:15 PM      Failed - Cr in normal range and within 360 days    Creatinine  Date Value Ref Range Status  03/25/2014 1.16 0.60 - 1.30 mg/dL Final   Creatinine, Ser  Date Value Ref Range Status  06/06/2020 1.10 (H) 0.57 - 1.00 mg/dL Final          Failed - HBA1C is between 0 and 7.9 and within 180 days    Hgb A1c MFr Bld  Date Value Ref Range Status  06/06/2020 14.9 (H) 4.8 - 5.6 % Final    Comment:             Prediabetes: 5.7 - 6.4          Diabetes: >6.4          Glycemic control for adults with diabetes: <7.0           Passed - AA eGFR in normal range and within 360 days    EGFR (African American)  Date Value Ref Range Status  03/25/2014 >60 >56m/min Final   GFR calc Af Amer  Date Value Ref Range Status  06/06/2020 63 >59 mL/min/1.73 Final    Comment:    **In accordance with recommendations from the NKF-ASN Task force,**   [facility] is in the process of updating its eGFR calculation to the   2021 CKD-EPI creatinine equation that estimates kidney function   without a race variable.    EGFR (Non-African Amer.)  Date Value Ref Range Status  03/25/2014 52 (L) >65mmin Final    Comment:    eGFR values <6080min/1.73 m2 may be an indication of chronic kidney  disease (CKD). Calculated eGFR, using the MRDR Study equation, is useful in  patients with stable renal function. The eGFR calculation will not be reliable in acutely ill patients when serum creatinine is changing rapidly. It is not useful in patients on dialysis. The eGFR calculation may not be applicable to patients at the low and high extremes of body sizes, pregnant women, and vegetarians.    GFR calc non Af Amer  Date Value Ref Range Status  06/06/2020 55 (L) >59 mL/min/1.73 Final          Passed - Valid encounter within last 6 months    Recent Outpatient Visits           4 months ago [facility]controlled type 2 [facility]   5 months ago Urinary incontinence, unspecified type   [facility]   6 months ago [facility]   8 months ago  Urinary urgency   [place], PA-C   11 months ago Type 2 diabetes mellitus without complication, without long-term current use of insulin [facility])   [place], [name], [place]

## 2020-11-02 ENCOUNTER — Other Ambulatory Visit: Payer: Self-pay | Admitting: Family Medicine

## 2020-11-02 DIAGNOSIS — E119 Type 2 diabetes mellitus without complications: Secondary | ICD-10-CM

## 2020-11-02 DIAGNOSIS — I10 Essential (primary) hypertension: Secondary | ICD-10-CM

## 2020-11-02 NOTE — Telephone Encounter (Signed)
Future visit in 2 weeks  

## 2020-11-02 NOTE — Telephone Encounter (Signed)
Notes to clinic: Patient has appt on 11/21/2020 Review for refills   Requested Prescriptions  Pending Prescriptions Disp Refills   atorvastatin (LIPITOR) 20 MG tablet [Pharmacy Med Name: ATORVASTATIN 20MG  TAB 20 Tablet] 30 tablet 10    Sig: TAKE 1 TABLET BY MOUTH DAILY *SCHEDULE FOLLOW-UP*      Cardiovascular:  Antilipid - Statins Failed - 11/02/2020  2:33 PM      Failed - Total Cholesterol in normal range and within 360 days    Cholesterol, Total  Date Value Ref Range Status  07/28/2019 163 100 - 199 mg/dL Final          Failed - LDL in normal range and within 360 days    LDL Chol Calc (NIH)  Date Value Ref Range Status  07/28/2019 107 (H) 0 - 99 mg/dL Final          Failed - HDL in normal range and within 360 days    HDL  Date Value Ref Range Status  07/28/2019 40 >39 mg/dL Final          Failed - Triglycerides in normal range and within 360 days    Triglycerides  Date Value Ref Range Status  07/28/2019 82 0 - 149 mg/dL Final          Passed - Patient is not pregnant      Passed - Valid encounter within last 12 months    Recent Outpatient Visits           4 months ago Uncontrolled type 2 diabetes mellitus with hyperglycemia ([place])   [name], [name], PA-C   5 months ago Urinary incontinence, unspecified type   [name], [name], PA-C   7 months ago [place], [name], PA-C   8 months ago Urinary urgency   [place], [name], PA-C   1 year ago Type 2 diabetes mellitus without complication, without long-term current use of insulin ([place])   [name], [name], PA-C       Future Appointments             In 2 weeks Chrismon, [name], PA-C [facility], [facility]               metoprolol tartrate (LOPRESSOR) 25 MG tablet [Pharmacy Med Name: METOPROLOL TART 25MG  TAB 25 Tablet] 60 tablet 10    Sig:  TAKE 1 TABLET BY MOUTH TWICE DAILY      Cardiovascular:  Beta Blockers Passed - 11/02/2020  2:33 PM      Passed - Last BP in normal range    BP Readings from Last 1 Encounters:  06/13/20 134/81          Passed - Last Heart Rate in normal range    Pulse Readings from Last 1 Encounters:  06/13/20 78          Passed - Valid encounter within last 6 months    Recent Outpatient Visits           4 months ago Uncontrolled type 2 diabetes mellitus with hyperglycemia ([place])   [place], [name], PA-C   5 months ago Urinary incontinence, unspecified type   [name], [name], PA-C   7 months ago [place], [name], PA-C   8 months ago Urinary urgency   [name], [name], [place]  1 year ago Type 2 diabetes mellitus without complication, without long-term current use of insulin [facility])   [place], [name], PA-C       Future Appointments             In 2 weeks Chrismon, [name], PA-C [facility], [place]

## 2020-11-03 ENCOUNTER — Other Ambulatory Visit: Payer: Self-pay | Admitting: Family Medicine

## 2020-11-03 DIAGNOSIS — E119 Type 2 diabetes mellitus without complications: Secondary | ICD-10-CM

## 2020-11-21 ENCOUNTER — Ambulatory Visit: Payer: Medicare Other | Admitting: Family Medicine

## 2020-11-21 ENCOUNTER — Other Ambulatory Visit: Payer: Self-pay | Admitting: Family Medicine

## 2020-11-21 DIAGNOSIS — E119 Type 2 diabetes mellitus without complications: Secondary | ICD-10-CM

## 2020-11-21 NOTE — Telephone Encounter (Signed)
Requested medication (s) are due for refill today: no  Requested medication (s) are on the active medication list: yes  Last refill:  10/10/20 #120  Future visit scheduled: pt has appt today at 1600  Notes to clinic:  forwarded to office pt has appt today    Requested Prescriptions  Pending Prescriptions Disp Refills   metFORMIN (GLUCOPHAGE) 500 MG tablet [Pharmacy Med Name: METFORMIN 500 MG TABS 500 Tablet] 120 tablet 10    Sig: TAKE 2 TABLETS BY MOUTH TWICE DAILY WITH MEALS *PATIENT NEEDS APPOINTMENT FOR FURTHER REFILLS*      Endocrinology:  Diabetes - Biguanides Failed - 11/21/2020 11:12 AM      Failed - Cr in normal range and within 360 days    Creatinine  Date Value Ref Range Status  03/25/2014 1.16 0.60 - 1.30 mg/dL Final   Creatinine, Ser  Date Value Ref Range Status  06/06/2020 1.10 (H) 0.57 - 1.00 mg/dL Final          Failed - HBA1C is between 0 and 7.9 and within 180 days    Hgb A1c MFr Bld  Date Value Ref Range Status  06/06/2020 14.9 (H) 4.8 - 5.6 % Final    Comment:             Prediabetes: 5.7 - 6.4          Diabetes: >6.4          Glycemic control for adults with diabetes: <7.0           Passed - AA eGFR in normal range and within 360 days    EGFR (African American)  Date Value Ref Range Status  03/25/2014 >60 >83mL/min Final   GFR calc Af Amer  Date Value Ref Range Status  06/06/2020 63 >59 mL/min/1.73 Final    Comment:    **In accordance with recommendations from the NKF-ASN Task force,**   [facility] is in the process of updating its eGFR calculation to the   2021 CKD-EPI creatinine equation that estimates kidney function   without a race variable.    EGFR (Non-African Amer.)  Date Value Ref Range Status  03/25/2014 52 (L) >45mL/min Final    Comment:    eGFR values <34mL/min/1.73 m2 may be an indication of chronic kidney disease (CKD). Calculated eGFR, using the MRDR Study equation, is useful in  patients with stable renal function. The  eGFR calculation will not be reliable in acutely ill patients when serum creatinine is changing rapidly. It is not useful in patients on dialysis. The eGFR calculation may not be applicable to patients at the low and high extremes of body sizes, pregnant women, and vegetarians.    GFR calc non Af Amer  Date Value Ref Range Status  06/06/2020 55 (L) >59 mL/min/1.73 Final          Passed - Valid encounter within last 6 months    Recent Outpatient Visits           5 months ago Uncontrolled type 2 diabetes mellitus with hyperglycemia [facility])   [facility] Chrismon, Jodell Cipro, PA-C   6 months ago Urinary incontinence, unspecified type   [facility], Jodell Cipro, PA-C   8 months ago Bronchitis   [facility], Jodell Cipro, PA-C   9 months ago Urinary urgency   [facility] Chrismon, Jodell Cipro, PA-C   1 year ago Type 2 diabetes mellitus without complication, without long-term current use of insulin (HCC)  [place], PA-C       Future Appointments             Today Chrismon, [name], PA-C [facility], [facility]

## 2020-11-28 ENCOUNTER — Telehealth: Payer: Self-pay | Admitting: *Deleted

## 2020-11-28 NOTE — Chronic Care Management (AMB) (Signed)
  Chronic Care Management   Note  11/28/2020 Name: MAKENNAH OMURA MRN: [id number] DOB: 1960-06-20  AMENDA DUCLOS is a 60 y.o. year old female who is a primary care patient of Chrismon, [name], PA-C. I reached out to Loni Beckwith by phone today in response to a referral sent by Ms. Jackie Plum Blaschke's PCP Chrismon, [name], PA-C     Ms. Kulkarni was given information about Chronic Care Management services today including:  CCM service includes personalized support from designated clinical staff supervised by her physician, including individualized plan of care and coordination with other care providers 24/7 contact phone numbers for assistance for urgent and routine care needs. Service will only be billed when office clinical staff spend 20 minutes or more in a month to coordinate care. Only one practitioner may furnish and bill the service in a calendar month. The patient may stop CCM services at any time (effective at the end of the month) by phone call to the office staff. The patient will be responsible for cost sharing (co-pay) of up to 20% of the service fee (after annual deductible is met).  Patient agreed to services and verbal consent obtained.   Follow up plan: Telephone appointment with care management team member scheduled for: 12/11/2020  Julian Hy, [place], [place] [facility]  Direct Dial: [phone]

## 2020-12-04 ENCOUNTER — Ambulatory Visit (INDEPENDENT_AMBULATORY_CARE_PROVIDER_SITE_OTHER): Payer: Medicare Other | Admitting: Family Medicine

## 2020-12-04 ENCOUNTER — Other Ambulatory Visit: Payer: Self-pay

## 2020-12-04 ENCOUNTER — Encounter: Payer: Self-pay | Admitting: Family Medicine

## 2020-12-04 VITALS — BP 110/70 | HR 76 | Temp 98.4°F | Ht 69.0 in | Wt 225.4 lb

## 2020-12-04 DIAGNOSIS — N3000 Acute cystitis without hematuria: Secondary | ICD-10-CM

## 2020-12-04 LAB — POCT URINALYSIS DIPSTICK
Appearance: NORMAL
Bilirubin, UA: NEGATIVE
Blood, UA: NEGATIVE
Glucose, UA: POSITIVE — AB
Ketones, UA: NEGATIVE
Nitrite, UA: POSITIVE
Odor: NORMAL
Protein, UA: POSITIVE — AB
Spec Grav, UA: 1.015 (ref 1.010–1.025)
Urobilinogen, UA: 0.2 E.U./dL
pH, UA: 5 (ref 5.0–8.0)

## 2020-12-04 MED ORDER — CIPROFLOXACIN HCL 500 MG PO TABS: 500.0000 mg | ORAL_TABLET | Freq: Two times a day (BID) | ORAL | 0 refills | Status: DC

## 2020-12-04 NOTE — Progress Notes (Signed)
Established patient visit   Patient: Emily Stokes   DOB: 1960-09-13   60 y.o. Female  MRN: [id number] Visit Date: 12/04/2020  Today's healthcare provider: [name], PA-C   No chief complaint on file.  Subjective    HPI UTI f/u  Patient Active Problem List   Diagnosis Date Noted   Spinal stenosis, lumbar region, with neurogenic claudication 04/27/2020   Personal history of colonic polyps    Polyp of transverse colon    Lumbar facet arthropathy 03/18/2018   Lumbar spondylosis 03/18/2018   Encounter for screening colonoscopy    Internal hemorrhoids    Benign neoplasm of descending colon    Benign neoplasm of cecum    Type 2 diabetes mellitus without complication, without long-term current use of insulin ([place]) 07/31/2017   Recurrent abdominal hernia without obstruction or gangrene 07/24/2017   Cellulitis of face 05/30/2016   Chronic pain syndrome 05/30/2016   Nasal septal abscess 05/30/2016   Pain in elbow 02/05/2016   Lumbar degenerative disc disease 09/14/2015   Facet syndrome, lumbar 09/14/2015   Lumbar radiculopathy 09/14/2015   Sacroiliac joint dysfunction 09/14/2015   History of chicken pox 02/02/2015   Leg pain 02/02/2015   Hypercholesteremia 02/02/2015   Blood glucose elevated 02/02/2015   Pain in soft tissues of limb 02/02/2015   Nonspecific ST-T changes 02/02/2015   Awareness of heartbeats 02/02/2015   Pain in extremity at multiple sites 02/02/2015   Snores 02/02/2015   Hernia of anterior abdominal wall 02/24/2012   Pain in shoulder 12/05/2011   Adnexal mass 10/30/2011   Pelvic cyst 10/30/2011   Palpitations 03/14/2011   HTN (hypertension) 03/14/2011   Abnormal finding on EKG 03/14/2011   Obesity 03/14/2011   Infected surgical wound 09/15/2008   Urinary system disease 08/10/2008   Benign essential HTN 07/12/2008   Arthralgia of lower leg 03/28/2008   Anemia, iron deficiency 06/17/2007   Calculus of kidney 05/26/2007   Past Medical  History:  Diagnosis Date   Abdominal pain, epigastric    Anemia    Backache    Bronchitis    Calculus, kidney    Carpal tunnel syndrome    Chest pain    Chicken pox    Circulatory disease    Diabetes mellitus without complication (HCC)    Disorder of kidney and ureter    Excess, menstruation    Hypercholesteremia    Hypertension, essential, benign    Infected postoperative seroma    Iron deficiency    Malaise and fatigue    Measles    MRSA (methicillin resistant staph aureus) culture positive    Nausea alone    Nonspecific abnormal electrocardiogram (ECG) (EKG)    Pain in joint, lower leg    Allergies  Allergen Reactions   Bactrim [Sulfamethoxazole-Trimethoprim] Swelling       Medications: Outpatient Medications Prior to Visit  Medication Sig   acetaminophen (TYLENOL) 500 MG tablet Take 500 mg by mouth every 6 (six) hours as needed.   amLODipine (NORVASC) 10 MG tablet TAKE 1 TABLET BY MOUTH DAILY   aspirin 81 MG tablet Take 81 mg by mouth daily.   atorvastatin (LIPITOR) 20 MG tablet TAKE 1 TABLET BY MOUTH DAILY *SCHEDULE FOLLOW-UP*   benzonatate (TESSALON) 100 MG capsule Take 1 capsule (100 mg total) by mouth 3 (three) times daily as needed for cough.   ciprofloxacin (CIPRO) 500 MG tablet Take 1 tablet (500 mg total) by mouth 2 (two) times daily.   gabapentin (NEURONTIN)  300 MG capsule Take 1 capsule (300 mg total) by mouth 2 (two) times daily.   glucose blood (ONETOUCH VERIO) test strip USE AS DIRECTED TO TEST BLOOD GLUCOSE BEFORE BREAKFAST. RECHECK IF ANY SYMPTOMS OF HYPOGLYCEMIA   Ibuprofen-diphenhydrAMINE Cit (IBUPROFEN PM PO) Take by mouth at bedtime as needed.   Lancets (ONETOUCH DELICA PLUS Q000111Q) MISC USE AS DIRECTED TWICE DAILY IN THE MORNING AND AT BEDTIME   metFORMIN (GLUCOPHAGE) 500 MG tablet Take 2 tablets (1,000 mg total) by mouth 2 (two) times daily with a meal.   metoprolol tartrate (LOPRESSOR) 25 MG tablet TAKE 1 TABLET BY MOUTH TWICE DAILY   No  facility-administered medications prior to visit.    Review of Systems      Objective    BP 110/70   Pulse 76   Temp 98.4 F (36.9 C) (Oral)   Ht '5\' 9"'$  (1.753 m)   Wt 225 lb 6.4 oz (102.2 kg)   SpO2 97%   BMI 33.29 kg/m  BP Readings from Last 3 Encounters:  06/13/20 134/81  06/06/20 138/84  05/17/20 (!) 162/98   Wt Readings from Last 3 Encounters:  06/13/20 233 lb (105.7 kg)  06/06/20 226 lb (102.5 kg)  05/17/20 233 lb (105.7 kg)   Physical Exam Constitutional:      General: She is not in acute distress.    Appearance: She is well-developed.  HENT:     Head: Normocephalic and atraumatic.     Right Ear: Hearing normal.     Left Ear: Hearing normal.     Nose: Nose normal.  Eyes:     General: Lids are normal. No scleral icterus.       Right eye: No discharge.        Left eye: No discharge.     Conjunctiva/sclera: Conjunctivae normal.  Cardiovascular:     Rate and Rhythm: Normal rate and regular rhythm.     Heart sounds: Normal heart sounds.  Pulmonary:     Effort: Pulmonary effort is normal. No respiratory distress.     Breath sounds: Normal breath sounds.  Abdominal:     General: Bowel sounds are normal.     Palpations: Abdomen is soft.  Musculoskeletal:        General: Normal range of motion.  Skin:    Findings: No lesion or rash.  Neurological:     Mental Status: She is alert and oriented to person, place, and time.  Psychiatric:        Speech: Speech normal.        Behavior: Behavior normal.        Thought Content: Thought content normal.      No results found for any visits on 12/04/20.  Assessment & Plan     1. Acute cystitis without hematuria Recurrence of frequency and slight dysuria. Urinalysis showed moderate leukocytes and positive nitrites today. Will refill the Cipro 500 mg BID for 10 days and increase fluid intake. Unable to get culture done urine QNS. Recheck in 10 days if any symptoms remain. Increase water intake to flush urinary  tract. - POCT urinalysis dipstick   No follow-ups on file.      I, Granvel Proudfoot, PA-C, have reviewed all documentation for this visit. The documentation on 02/04/21 for the exam, diagnosis, procedures, and orders are all accurate and complete.    [name], PA-C  [facility] [phone] (phone) [phone] (fax)  [place]

## 2020-12-08 ENCOUNTER — Other Ambulatory Visit: Payer: Self-pay | Admitting: Family Medicine

## 2020-12-08 DIAGNOSIS — I10 Essential (primary) hypertension: Secondary | ICD-10-CM

## 2020-12-11 ENCOUNTER — Other Ambulatory Visit: Payer: Self-pay | Admitting: Family Medicine

## 2020-12-11 ENCOUNTER — Ambulatory Visit (INDEPENDENT_AMBULATORY_CARE_PROVIDER_SITE_OTHER): Payer: Medicare Other

## 2020-12-11 DIAGNOSIS — E7800 Pure hypercholesterolemia, unspecified: Secondary | ICD-10-CM | POA: Diagnosis not present

## 2020-12-11 DIAGNOSIS — I10 Essential (primary) hypertension: Secondary | ICD-10-CM | POA: Diagnosis not present

## 2020-12-11 DIAGNOSIS — E1165 Type 2 diabetes mellitus with hyperglycemia: Secondary | ICD-10-CM

## 2020-12-11 IMAGING — MR MR LUMBAR SPINE W/O CM
5 series · 30 of 48 positions shown · non-contrast
Comparison: MRI of the lumbar spine July 13, 2015.

CLINICAL DATA: Osteoarthritis, lumbosacral. Low back pain. Lumbar
radiculopathy.

EXAM:
MRI LUMBAR SPINE WITHOUT CONTRAST
TECHNIQUE: Multiplanar, multisequence MR imaging of the lumbar spine was
performed. No intravenous contrast was administered.

[Series 9: T2 · sagittal · 4.0mm · 0.81mm/px · 6 of 17 slices shown (1 of 2)]
[im 1/17]
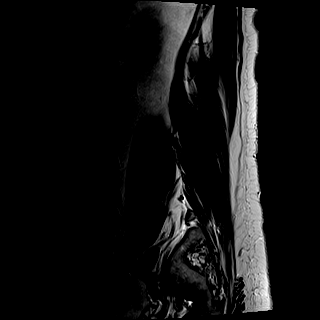
[im 4/17]
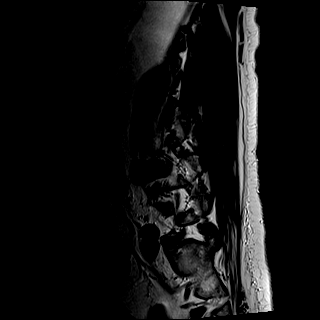
[im 7/17]
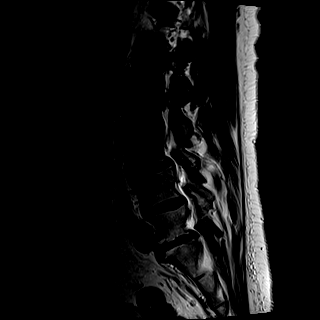
[im 10/17]
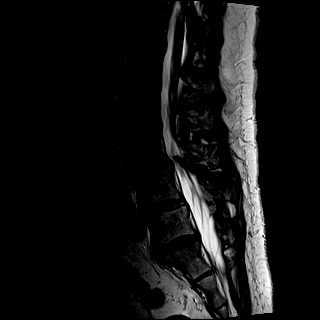
[im 13/17]
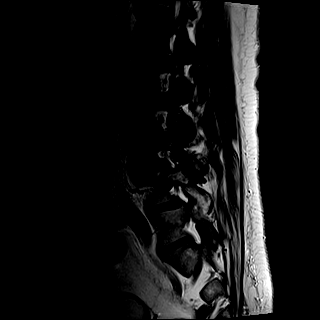
[im 17/17]
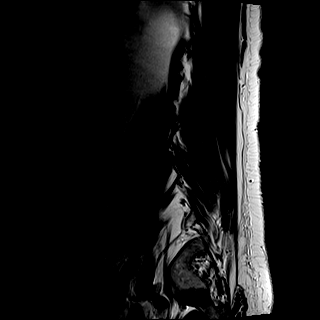

[Series 10: T1 · sagittal · 4.0mm · 0.81mm/px · 7 of 17 slices shown (1 of 2)]
[im 1/17]
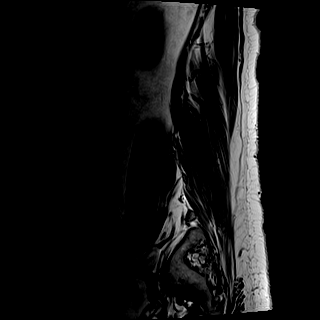
[im 3/17]
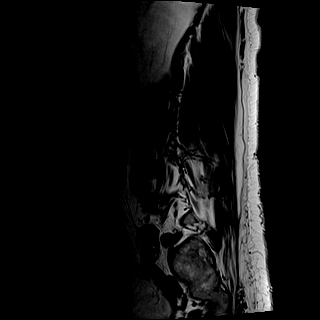
[im 6/17]
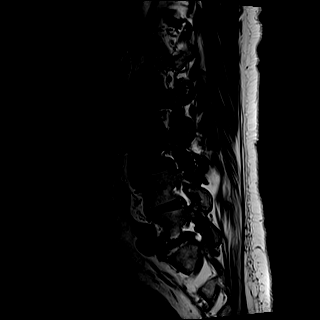
[im 9/17]
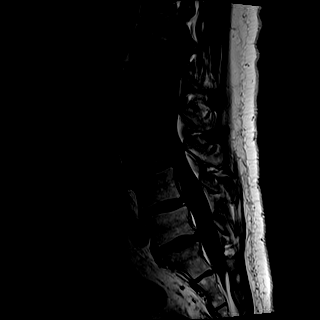
[im 11/17]
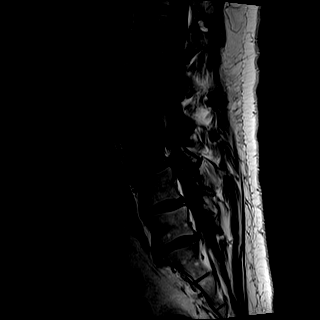
[im 14/17]
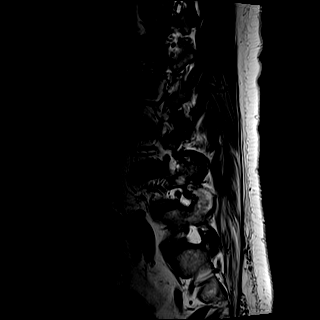
[im 17/17]
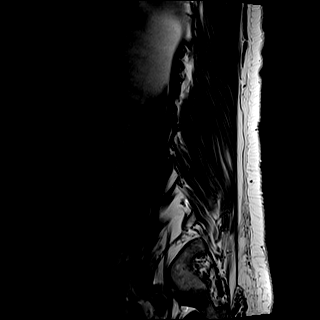

[Series 11: STIR · sagittal · 4.0mm · 0.41mm/px · 1 of 17 slices shown]
[im 1/17]
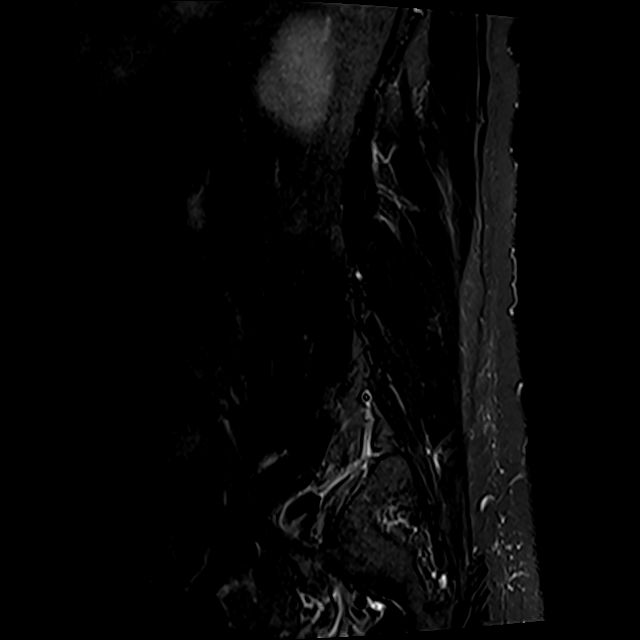

[Series 12: T2 · axial · 4.0mm · 0.78mm/px · z∈[+22,+221]mm · 8 of 34 slices shown (2 of 2)]
[im 1/34]
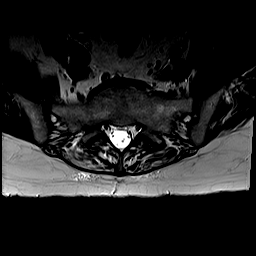
[im 6/34]
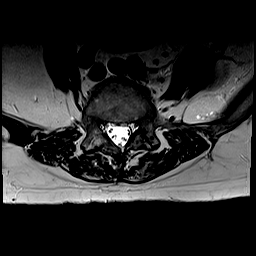
[im 11/34]
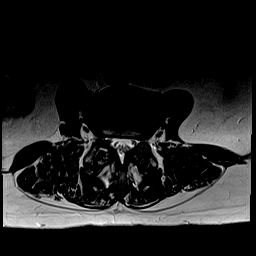
[im 16/34]
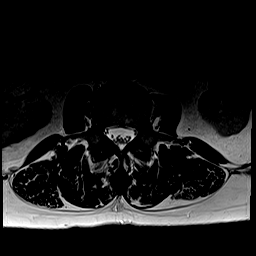
[im 18/34]
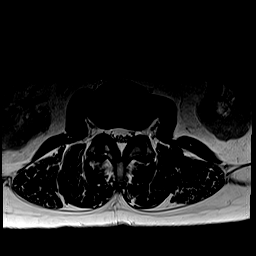
[im 23/34]
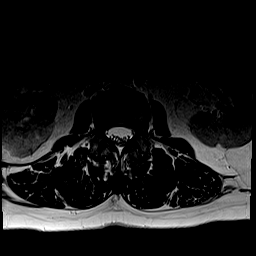
[im 28/34]
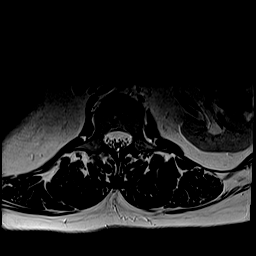
[im 34/34]
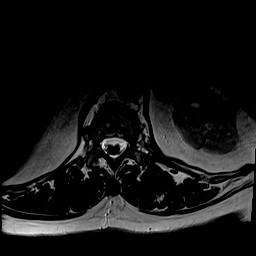

[Series 13: T1 · axial · 4.0mm · 0.39mm/px · z∈[+22,+221]mm · 8 of 34 slices shown (2 of 2)]
[im 1/34]
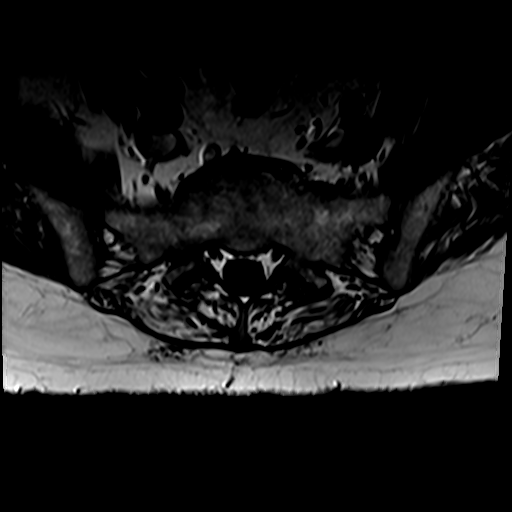
[im 6/34]
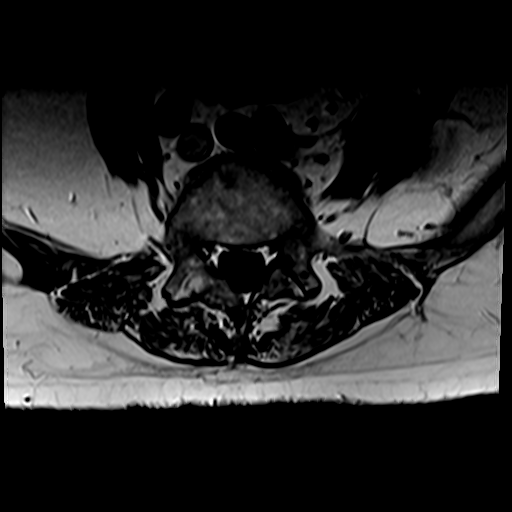
[im 11/34]
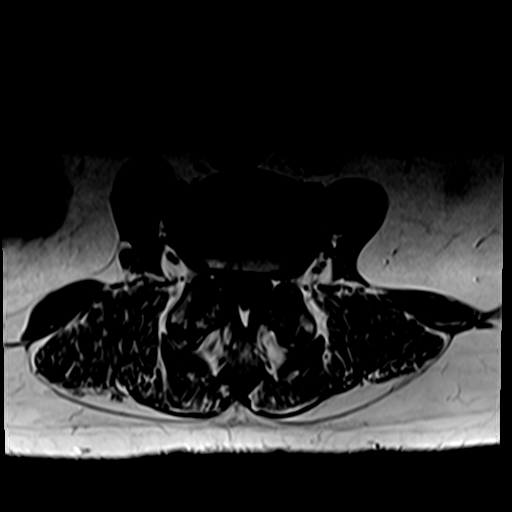
[im 16/34]
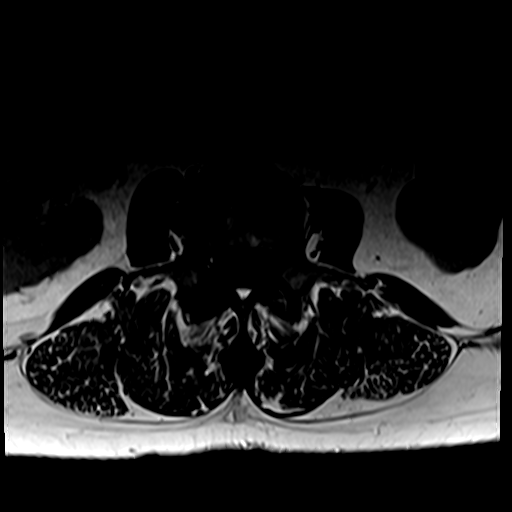
[im 18/34]
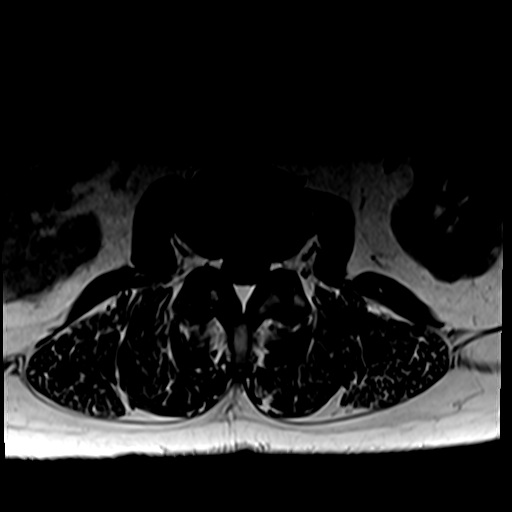
[im 23/34]
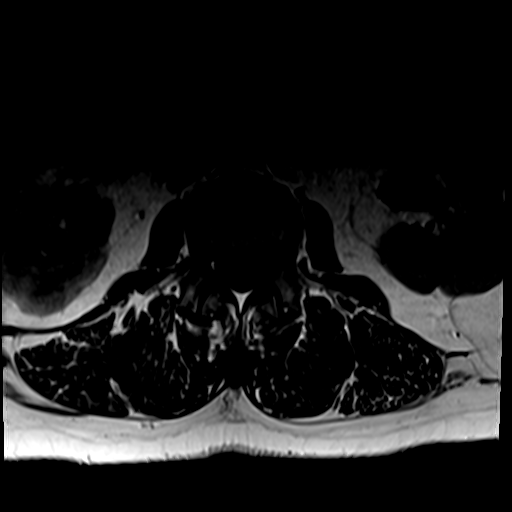
[im 28/34]
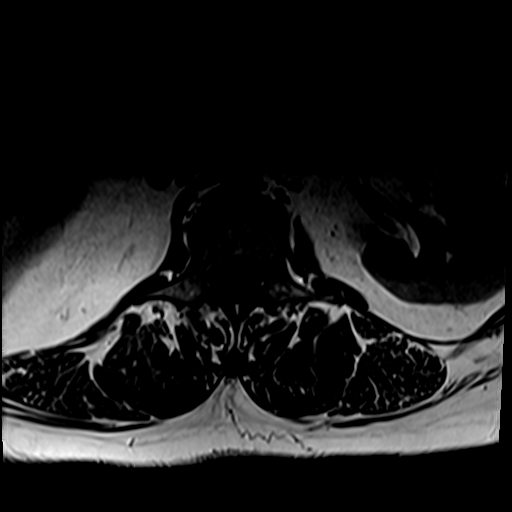
[im 34/34]
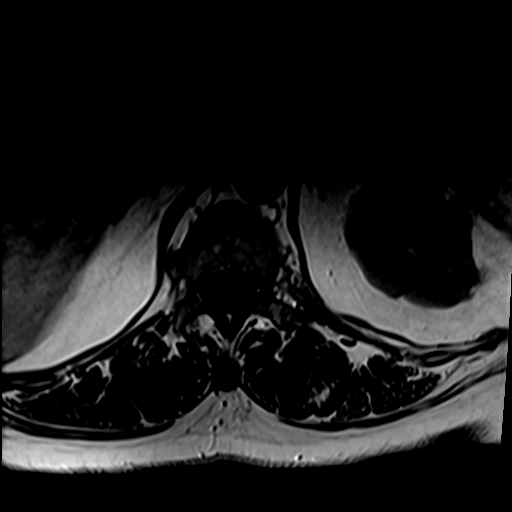

[30 of 48 positions shown; findings below may reference images not displayed]

FINDINGS: Segmentation:  Standard.

Alignment: Grade 1 anterolisthesis of L3 over L4, progressed from
prior MRI. Minimal anterolisthesis of L4 over L5.

Vertebrae:  No fracture, evidence of discitis, or bone lesion.

Conus medullaris and cauda equina: Conus extends to the L1 level.
Conus and cauda equina appear normal.

Paraspinal and other soft tissues: Left renal cyst.

Disc levels:

T12-L1: No spinal canal or neural foraminal stenosis.

L1-2: No spinal canal or neural foraminal stenosis.

L2-3: Shallow disc bulge, facet degenerative changes and ligamentum
flavum redundancy without significant spinal canal or neural
foraminal stenosis.

L3-4: Disc bulge/disc uncovering, prominent hypertrophic facet
degenerative changes and ligamentum flavum redundancy resulting in
mild spinal canal stenosis with narrowing of the bilateral
subarticular zones, mild right and moderate left neural foraminal
narrowing.

L4-5: Disc bulge, prominent facet degenerative changes without
significant spinal canal or neural foraminal stenosis.

L5-S1: Left asymmetric disc bulge with associated osteophytic
component and facet degenerative changes resulting in narrowing of
the left subarticular zone and mild left neural foraminal narrowing.
No spinal canal stenosis.

Compared to prior study, there is progression of the facet arthrosis
and spinal canal stenosis at L3-4.
IMPRESSION: 1. Interval progression of grade 1 anterolisthesis of L3 over L4.
2. Multilevel degenerative changes of the lumbar spine, worst at
L3-4 where there is mild spinal canal stenosis with narrowing of the
bilateral subarticular zones, mild right and moderate left neural
foraminal narrowing, mildly progressed from prior MRI.
3. Mild left neural foraminal narrowing at L5-S1.

## 2020-12-11 NOTE — Chronic Care Management (AMB) (Signed)
Chronic Care Management   CCM RN Visit Note  12/11/2020 Name: Emily Stokes MRN: [id number] DOB: 1960/07/25  Subjective: Emily Stokes is a 60 y.o. year old female who is a primary care patient of Chrismon, [name], PA-C. The care management team was consulted for assistance with disease management and care coordination needs.    Engaged with patient by telephone for initial visit in response to provider referral for case management and/or care coordination services.   Consent to Services:  The patient was given the following information about Chronic Care Management services: 1. CCM service includes personalized support from designated clinical staff supervised by the primary care provider, including individualized plan of care and coordination with other care providers 2. 24/7 contact phone numbers for assistance for urgent and routine care needs. 3. Service will only be billed when office clinical staff spend 20 minutes or more in a month to coordinate care. 4. Only one practitioner may furnish and bill the service in a calendar month. 5.The patient may stop CCM services at any time (effective at the end of the month) by phone call to the office staff. 6. The patient will be responsible for cost sharing (co-pay) of up to 20% of the service fee (after annual deductible is met). Patient agreed to services and consent obtained.   Assessment: Review of patient past medical history, allergies, medications, health status, including review of consultants reports, laboratory and other test data, was performed as part of comprehensive evaluation and provision of chronic care management services.   SDOH (Social Determinants of Health) assessments and interventions performed:  SDOH Interventions    Flowsheet Row Most Recent Value  SDOH Interventions   Food Insecurity Interventions Intervention Not Indicated  Transportation Interventions Intervention Not Indicated        CCM Care  Plan  Allergies  Allergen Reactions   Bactrim [Sulfamethoxazole-Trimethoprim] Swelling    Outpatient Encounter Medications as of 12/11/2020  Medication Sig   acetaminophen (TYLENOL) 500 MG tablet Take 500 mg by mouth every 6 (six) hours as needed.   amLODipine (NORVASC) 10 MG tablet TAKE 1 TABLET BY MOUTH DAILY   aspirin 81 MG tablet Take 81 mg by mouth daily.   atorvastatin (LIPITOR) 20 MG tablet TAKE 1 TABLET BY MOUTH DAILY *SCHEDULE FOLLOW-UP*   ciprofloxacin (CIPRO) 500 MG tablet Take 1 tablet (500 mg total) by mouth 2 (two) times daily for 10 days.   metFORMIN (GLUCOPHAGE) 500 MG tablet Take 2 tablets (1,000 mg total) by mouth 2 (two) times daily with a meal.   metoprolol tartrate (LOPRESSOR) 25 MG tablet TAKE 1 TABLET BY MOUTH TWICE DAILY   benzonatate (TESSALON) 100 MG capsule Take 1 capsule (100 mg total) by mouth 3 (three) times daily as needed for cough. (Patient not taking: No sig reported)   gabapentin (NEURONTIN) 300 MG capsule Take 1 capsule (300 mg total) by mouth 2 (two) times daily.   glucose blood (ONETOUCH VERIO) test strip USE AS DIRECTED TO TEST BLOOD GLUCOSE BEFORE BREAKFAST. RECHECK IF ANY SYMPTOMS OF HYPOGLYCEMIA   Ibuprofen-diphenhydrAMINE Cit (IBUPROFEN PM PO) Take by mouth at bedtime as needed.   Lancets (ONETOUCH DELICA PLUS [id number]) MISC USE AS DIRECTED TWICE DAILY IN THE MORNING AND AT BEDTIME   No facility-administered encounter medications on file as of 12/11/2020.    Patient Active Problem List   Diagnosis Date Noted   Spinal stenosis, lumbar region, with neurogenic claudication 04/27/2020   Personal history of colonic polyps    Polyp  of transverse colon    Lumbar facet arthropathy 03/18/2018   Lumbar spondylosis 03/18/2018   Encounter for screening colonoscopy    Internal hemorrhoids    Benign neoplasm of descending colon    Benign neoplasm of cecum    Type 2 diabetes mellitus without complication, without long-term current use of insulin ([place])  07/31/2017   Recurrent abdominal hernia without obstruction or gangrene 07/24/2017   Cellulitis of face 05/30/2016   Chronic pain syndrome 05/30/2016   Nasal septal abscess 05/30/2016   Pain in elbow 02/05/2016   Lumbar degenerative disc disease 09/14/2015   Facet syndrome, lumbar 09/14/2015   Lumbar radiculopathy 09/14/2015   Sacroiliac joint dysfunction 09/14/2015   History of chicken pox 02/02/2015   Leg pain 02/02/2015   Hypercholesteremia 02/02/2015   Blood glucose elevated 02/02/2015   Pain in soft tissues of limb 02/02/2015   Nonspecific ST-T changes 02/02/2015   Awareness of heartbeats 02/02/2015   Pain in extremity at multiple sites 02/02/2015   Snores 02/02/2015   Hernia of anterior abdominal wall 02/24/2012   Pain in shoulder 12/05/2011   Adnexal mass 10/30/2011   Pelvic cyst 10/30/2011   Palpitations 03/14/2011   HTN (hypertension) 03/14/2011   Abnormal finding on EKG 03/14/2011   Obesity 03/14/2011   Infected surgical wound 09/15/2008   Urinary system disease 08/10/2008   Benign essential HTN 07/12/2008   Arthralgia of lower leg 03/28/2008   Anemia, iron deficiency 06/17/2007   Calculus of kidney 05/26/2007    Conditions to be addressed/monitored:HTN, HLD, DMII.  Patient Care Plan: Diabetes Type 2 (Adult)     Problem Identified: Disease Progression (Diabetes, Type 2)      Long-Range Goal: Disease Progression Prevented or Minimized   Start Date: 12/11/2020  Expected End Date: 04/10/2021  Priority: High  Note:   Objective:  Lab Results  Component Value Date   HGBA1C 14.9 (H) 06/06/2020   Lab Results  Component Value Date   CREATININE 1.10 (H) 06/06/2020   CREATININE 1.04 (H) 07/28/2019   CREATININE 1.16 (H) 04/20/2019   No results found for: EGFR  Current Barriers:  Chronic Disease Management support and educational needs related to Diabetes self-management.  Case Manager Clinical Goal(s):  Over the next 120 days, patient will demonstrate  improved adherence to prescribed treatment plan for diabetes self care/management as evidenced by: taking medications as prescribed, daily monitoring and recording CBG's,  adherence to an ADA/ carb modified diet.   Interventions:  Collaboration with Chrismon, [name], PA-C regarding development and update of comprehensive plan of care as evidenced by provider attestation and co-signature Inter-disciplinary care team collaboration (see longitudinal plan of care) Reviewed medications and discussed importance of medication adherence. Advised to continue taking medications as prescribed and notify provider is unable to tolerate the regimen. Advised to update the care management team with concerns regarding medication management and prescription cost. Discussed s/sx of hypoglycemia and hyperglycemia along with recommended interventions. Advised to monitor blood glucose levels daily and maintain a log. Discussed importance of exercise and adhering to a modified carb/ADA diet. Advised to read nutrition labels and limit intake of processed carbohydrates. Reviewed recommended diabetic exams. Reports not being followed by Podiatry but completes foot care as advised. Denies recent changes. Reports completing eye exams as recommended. Last exam was completed within the last year. Discussed available Diabetic education and nutrition course. Diabetes is currently uncontrolled. Declined need for additional resources. Agreed to consider A1C does not improve.   Self-Care/Patient Goals: Self administer medications as prescribed  Attend all scheduled provider appointments Monitor blood glucose levels and record readings Engage in low impact activity as tolerated Adhere to prescribed ADA/carb modified Contact clinic with questions and new concerns as needed   Follow Up Plan:  Will follow up in two months    Patient Care Plan: Hypertension (Adult)     Problem Identified: Hypertension (Hypertension)       Long-Range Goal: Hypertension Monitored   Start Date: 12/11/2020  Expected End Date: 04/10/2021  Priority: High  Note:   Objective:  Last practice recorded BP readings:  BP Readings from Last 3 Encounters:  12/04/20 110/70  06/13/20 134/81  06/06/20 138/84   Most recent eGFR/CrCl: No results found for: EGFR  No components found for: CRCL Lab Results  Component Value Date   CHOL 163 07/28/2019   HDL 40 07/28/2019   LDLCALC 107 (H) 07/28/2019   TRIG 82 07/28/2019   CHOLHDL 5.3 (H) 04/20/2019     Current Barriers:  Chronic Disease Management support and educational needs related to Hypertension and Dyslipidemia.  Case Manager Clinical Goal(s):  Over the next 120 days, patient will demonstrate improved adherence to prescribed treatment plan as evidenced by taking all medications as prescribed, monitoring and recording blood pressure and adhering to a low sodium/DASH diet.  Interventions:  Collaboration with Chrismon, [name], PA-C regarding development and update of comprehensive plan of care as evidenced by provider attestation and co-signature. Inter-disciplinary care team collaboration (see longitudinal plan of care) Reviewed medications and current treatment plan. Advised to take all medications as prescribed and notify provider if unable to tolerate prescribed regimen. Advised to update the care management team with concerns regarding medication management and prescription cost. Discussed established blood pressure parameters and indications for notifying a provider. Advised to monitor BP daily and record readings. Discussed nutritional intake and importance of adhering to a low sodium/heart healthy diet. Discussed importance of engaging in low impact activity when able. Advised to continue monitoring sodium intake. Advised to avoid read nutrition labels and avoid highly processed foods when possible. Reviewed s/sx of heart attack, stroke and worsening symptoms that require  immediate medical attention.   Self-Care/Patient Goals: Self administer medications as prescribed Attend all scheduled provider appointments Monitor BP and record readings Adhere to a low sodium/DASH diet Engage in low impact activity as tolerated Contact provider office with questions and new concerns as needed   Follow Up Plan:  Will follow up in two months        PLAN A member of the care management team will follow up with Emily Stokes in two months.   [name] Care Management [facility] [phone]

## 2020-12-11 NOTE — Telephone Encounter (Signed)
Requested medication (s) are due for refill today: Yes  Requested medication (s) are on the active medication list: Yes  Last refill:  12/04/20  Future visit scheduled: No  Notes to clinic:  Unable to refill per protocol, appointment needed.      Requested Prescriptions  Pending Prescriptions Disp Refills   atorvastatin (LIPITOR) 20 MG tablet [Pharmacy Med Name: ATORVASTATIN '20MG'$  TAB 20 Tablet] 30 tablet 10    Sig: TAKE 1 TABLET BY MOUTH DAILY *SCHEDULE FOLLOW-UP*      Cardiovascular:  Antilipid - Statins Failed - 12/11/2020 10:16 AM      Failed - Total Cholesterol in normal range and within 360 days    Cholesterol, Total  Date Value Ref Range Status  07/28/2019 163 100 - 199 mg/dL Final          Failed - LDL in normal range and within 360 days    LDL Chol Calc (NIH)  Date Value Ref Range Status  07/28/2019 107 (H) 0 - 99 mg/dL Final          Failed - HDL in normal range and within 360 days    HDL  Date Value Ref Range Status  07/28/2019 40 >39 mg/dL Final          Failed - Triglycerides in normal range and within 360 days    Triglycerides  Date Value Ref Range Status  07/28/2019 82 0 - 149 mg/dL Final          Passed - Patient is not pregnant      Passed - Valid encounter within last 12 months    Recent Outpatient Visits           1 week ago Acute cystitis without hematuria   [name], [name], PA-C   6 months ago Uncontrolled type 2 diabetes mellitus with hyperglycemia ([place])   [name], [name], PA-C   7 months ago Urinary incontinence, unspecified type   [name], [name], PA-C   9 months ago [place], [name], PA-C   10 months ago Urinary urgency   [place], PA-C                 Signed Prescriptions Disp Refills   metoprolol tartrate (LOPRESSOR) 25 MG tablet 180 tablet 1    Sig:  TAKE ONE (1) TABLET BY MOUTH TWICE DAILY      Cardiovascular:  Beta Blockers Passed - 12/11/2020 10:16 AM      Passed - Last BP in normal range    BP Readings from Last 1 Encounters:  12/04/20 110/70          Passed - Last Heart Rate in normal range    Pulse Readings from Last 1 Encounters:  12/04/20 76          Passed - Valid encounter within last 6 months    Recent Outpatient Visits           1 week ago Acute cystitis without hematuria   [place], [name], PA-C   6 months ago Uncontrolled type 2 diabetes mellitus with hyperglycemia [facility])   [place], [name], PA-C   7 months ago Urinary incontinence, unspecified type   [name], [name], PA-C   9 months ago [place], [name], PA-C   10 months ago Urinary urgency  [place], [place]

## 2020-12-11 NOTE — Patient Instructions (Addendum)
Thank you for allowing the Chronic Care Management team to participate in your care.    Goals Addressed: Patient Care Plan: Diabetes Type 2 (Adult)     Problem Identified: Disease Progression (Diabetes, Type 2)      Long-Range Goal: Disease Progression Prevented or Minimized   Start Date: 12/11/2020  Expected End Date: 04/10/2021  Priority: High  Note:   Objective:  Lab Results  Component Value Date   HGBA1C 14.9 (H) 06/06/2020   Lab Results  Component Value Date   CREATININE 1.10 (H) 06/06/2020   CREATININE 1.04 (H) 07/28/2019   CREATININE 1.16 (H) 04/20/2019   No results found for: EGFR  Current Barriers:  Chronic Disease Management support and educational needs related to Diabetes self-management.  Case Manager Clinical Goal(s):  Over the next 120 days, patient will demonstrate improved adherence to prescribed treatment plan for diabetes self care/management as evidenced by: taking medications as prescribed, daily monitoring and recording CBG's,  adherence to an ADA/ carb modified diet.   Interventions:  Collaboration with Emily Stokes, Emily Muff, PA-C regarding development and update of comprehensive plan of care as evidenced by provider attestation and co-signature Inter-disciplinary care team collaboration (see longitudinal plan of care) Reviewed medications and discussed importance of medication adherence. Advised to continue taking medications as prescribed and notify provider is unable to tolerate the regimen. Advised to update the care management team with concerns regarding medication management and prescription cost. Discussed s/sx of hypoglycemia and hyperglycemia along with recommended interventions. Advised to monitor blood glucose levels daily and maintain a log. Discussed importance of exercise and adhering to a modified carb/ADA diet. Advised to read nutrition labels and limit intake of processed carbohydrates. Reviewed recommended diabetic exams. Reports not being  followed by Podiatry but completes foot care as advised. Denies recent changes. Reports completing eye exams as recommended. Last exam was completed within the last year. Discussed available Diabetic education and nutrition course. Diabetes is currently uncontrolled. Declined need for additional resources. Agreed to consider A1C does not improve.   Self-Care/Patient Goals: Self administer medications as prescribed Attend all scheduled provider appointments Monitor blood glucose levels and record readings Engage in low impact activity as tolerated Adhere to prescribed ADA/carb modified Contact clinic with questions and new concerns as needed   Follow Up Plan:  Will follow up in two months    Patient Care Plan: Hypertension (Adult)     Problem Identified: Hypertension (Hypertension)      Long-Range Goal: Hypertension Monitored   Start Date: 12/11/2020  Expected End Date: 04/10/2021  Priority: High  Note:   Objective:  Last practice recorded BP readings:  BP Readings from Last 3 Encounters:  12/04/20 110/70  06/13/20 134/81  06/06/20 138/84   Most recent eGFR/CrCl: No results found for: EGFR  No components found for: CRCL Lab Results  Component Value Date   CHOL 163 07/28/2019   HDL 40 07/28/2019   LDLCALC 107 (H) 07/28/2019   TRIG 82 07/28/2019   CHOLHDL 5.3 (H) 04/20/2019     Current Barriers:  Chronic Disease Management support and educational needs related to Hypertension and Dyslipidemia.  Case Manager Clinical Goal(s):  Over the next 120 days, patient will demonstrate improved adherence to prescribed treatment plan as evidenced by taking all medications as prescribed, monitoring and recording blood pressure and adhering to a low sodium/DASH diet.  Interventions:  Collaboration with Emily Stokes, Emily Muff, PA-C regarding development and update of comprehensive plan of care as evidenced by provider attestation and co-signature. Inter-disciplinary care  team  collaboration (see longitudinal plan of care) Reviewed medications and current treatment plan. Advised to take all medications as prescribed and notify provider if unable to tolerate prescribed regimen. Advised to update the care management team with concerns regarding medication management and prescription cost. Discussed established blood pressure parameters and indications for notifying a provider. Advised to monitor BP daily and record readings. Discussed nutritional intake and importance of adhering to a low sodium/heart healthy diet. Discussed importance of engaging in low impact activity when able. Advised to continue monitoring sodium intake. Advised to avoid read nutrition labels and avoid highly processed foods when possible. Reviewed s/sx of heart attack, stroke and worsening symptoms that require immediate medical attention.   Self-Care/Patient Goals: Self administer medications as prescribed Attend all scheduled provider appointments Monitor BP and record readings Adhere to a low sodium/DASH diet Engage in low impact activity as tolerated Contact provider office with questions and new concerns as needed   Follow Up Plan:  Will follow up in two months        Emily Stokes verbalized understanding of the information discussed during the telephonic outreach today. Declined need for mailed/printed instructions. A member of the care management team will follow up in two months.   Emily Stokes [facility] Care Management [facility] [phone]

## 2020-12-11 NOTE — Telephone Encounter (Signed)
Patient called, left VM to return the call to the office to schedule an OV for refills, noted on last refill on 12/04/20 for Atorvastatin to schedule appointment.

## 2020-12-12 ENCOUNTER — Ambulatory Visit: Payer: Medicare Other | Admitting: Student in an Organized Health Care Education/Training Program

## 2020-12-14 ENCOUNTER — Ambulatory Visit
Payer: Medicare Other | Attending: Student in an Organized Health Care Education/Training Program | Admitting: Student in an Organized Health Care Education/Training Program

## 2020-12-14 ENCOUNTER — Encounter: Payer: Self-pay | Admitting: Student in an Organized Health Care Education/Training Program

## 2020-12-14 ENCOUNTER — Other Ambulatory Visit: Payer: Self-pay

## 2020-12-14 DIAGNOSIS — M5416 Radiculopathy, lumbar region: Secondary | ICD-10-CM | POA: Diagnosis not present

## 2020-12-14 DIAGNOSIS — M48062 Spinal stenosis, lumbar region with neurogenic claudication: Secondary | ICD-10-CM

## 2020-12-14 DIAGNOSIS — G894 Chronic pain syndrome: Secondary | ICD-10-CM | POA: Diagnosis not present

## 2020-12-14 DIAGNOSIS — M47816 Spondylosis without myelopathy or radiculopathy, lumbar region: Secondary | ICD-10-CM | POA: Diagnosis not present

## 2020-12-14 MED ORDER — GABAPENTIN 300 MG PO CAPS: 300.0000 mg | ORAL_CAPSULE | Freq: Two times a day (BID) | ORAL | 5 refills | Status: DC

## 2020-12-14 NOTE — Progress Notes (Signed)
Patient: Emily Stokes  Service Category: E/M  Provider: Gillis Santa, MD  DOB: 06-10-1960  DOS: 12/14/2020  Location: Office  MRN: [id number]  Setting: Ambulatory outpatient  Referring Provider: Margo Common, PA-C  Type: Established Patient  Specialty: Interventional Pain Management  PCP: Margo Common, PA-C  Location: Home  Delivery: TeleHealth     Virtual Encounter - Pain Management PROVIDER NOTE: Information contained herein reflects review and annotations entered in association with encounter. Interpretation of such information and data should be left to medically-trained personnel. Information provided to patient can be located elsewhere in the medical record under "Patient Instructions". Document created using STT-dictation technology, any transcriptional errors that may result from process are unintentional.    Contact & Pharmacy Preferred: [phone] Home: [phone] (home) Mobile: [phone] (mobile) E-mail: [email or web address]  [place], [place] [address] Phone: [phone] Fax: [phone]  [facility] [place]: [phone]: [address] [place]Phone: [phone]: [address]Phone: [phone]: [phone] [facility] [place] [place]Phone: [phone]: [phone]  Pre-screening  Ms. CDefinooffered "in-person" vs "virtual" encounter. She indicated preferring virtual for this encounter.    Reason COVID-19*  Social distancing based on CDC and AMA recommendations.   I contacted DLoni Beckwithon 12/14/2020 via video conference.      I clearly identified myself as BGillis Santa MD. I verified that I was speaking with the correct person using two identifiers (Name: DILAH BOULE and date of birth: 7May 09, 1962.  Consent I sought verbal advanced consent from DLoni Beckwithfor virtual visit interactions. I informed Ms. CCurvinof possible security and privacy concerns, risks, and limitations associated with providing "not-in-person" medical evaluation and management services. I also informed Ms. CBolteof the availability of "in-person" appointments. Finally, I informed her that there would be a charge for the virtual visit and that she could be  personally, fully or partially, financially responsible for it. Ms. CRoarkexpressed understanding and agreed to proceed.   Historic Elements   Ms. DMAKISHA MARRINis a 60y.o. year old, female patient evaluated today after our last contact on 12/12/2020. Ms. CPeace has a past medical history of Abdominal pain, epigastric, Anemia, Backache, Bronchitis, Calculus, kidney, Carpal tunnel syndrome, Chest pain, Chicken pox, Circulatory disease, Diabetes mellitus without complication ([place], Disorder of kidney and ureter, Excess, menstruation, Hypercholesteremia, Hypertension, essential, benign, Infected postoperative seroma, Iron deficiency, Malaise and fatigue, Measles, MRSA (methicillin resistant staph aureus) culture positive, Nausea alone, Nonspecific abnormal electrocardiogram (ECG) (EKG), and Pain in joint, lower leg. She also  has a past surgical history that includes Colonoscopy (1997); Partial hysterectomy (2009); Cesarean section; Lithotripsy (1997); mrsa; Hernia repair (2012); Abdominal hysterectomy; Hartmann's procedure.; Colostomy takedown (2012); Colonoscopy with propofol (N/A, 01/09/2018); and Colonoscopy with propofol (N/A, 08/20/2019). Ms. CNuon has a current medication list which includes the following prescription(s): acetaminophen, amlodipine, aspirin, atorvastatin, ciprofloxacin, onetouch verio, ibuprofen-diphenhydramine cit, onetouch delica plus [id number] metformin, metoprolol tartrate, and  gabapentin. She  reports that she has never smoked. She has never used smokeless tobacco. She reports that she does not drink alcohol and does not use drugs. Ms. Blizard is allergic to bactrim [sulfamethoxazole-trimethoprim].   HPI  Today, she is being contacted for worsening of previously known (established) problem  Patient is complaining of low back pain that radiates into her left leg in a posterior lateral distribution.  She states that the pain is more frequent and she is having difficulty ambulating.  Of note her previous L3-L4 lumbar epidural steroid injection was 05/17/2020 which provided 80% pain relief for 3 months with gradual return of pain thereafter.  We discussed repeating lumbar epidural steroid injection with p.o. Valium.  Risk and benefits reviewed.  Laboratory Chemistry Profile   Renal Lab Results  Component Value Date   BUN 12 06/06/2020   CREATININE 1.10 (H) 06/06/2020   BCR 11 (L) 06/06/2020   GFRAA 63 06/06/2020   GFRNONAA 55 (L) 06/06/2020    Hepatic Lab Results  Component Value Date   AST 31 06/06/2020   ALT 25 06/06/2020   ALBUMIN 4.0 06/06/2020   ALKPHOS 116 06/06/2020   LIPASE 26 07/07/2017    Electrolytes Lab Results  Component Value Date   NA 140 06/06/2020   K 4.3 06/06/2020   CL 104 06/06/2020   CALCIUM 9.4 06/06/2020    Bone No results found for: VD25OH, VD125OH2TOT, JH4174YC1, KG8185UD1, 25OHVITD1, 25OHVITD2, 25OHVITD3, TESTOFREE, TESTOSTERONE  Inflammation (CRP: Acute Phase) (ESR: Chronic Phase) No results found for: CRP, ESRSEDRATE, LATICACIDVEN       Note: Above Lab results reviewed.   Assessment  The primary encounter diagnosis was Lumbar radiculopathy (left L3/4). Diagnoses of Spinal  stenosis, lumbar region, with neurogenic claudication, Lumbar spondylosis, and Chronic pain syndrome were also pertinent to this visit.  Plan of Care    1. Lumbar radiculopathy (left L3/4) - Lumbar Epidural Injection; Future - gabapentin (NEURONTIN) 300 MG capsule; Take 1 capsule (300 mg total) by mouth 2 (two) times daily.  Dispense: 60 capsule; Refill: 5  2. Spinal stenosis, lumbar region, with neurogenic claudication - Lumbar Epidural Injection; Future  3. Lumbar spondylosis - Lumbar Epidural Injection; Future - gabapentin (NEURONTIN) 300 MG capsule; Take 1 capsule (300 mg total) by mouth 2 (two) times daily.  Dispense: 60 capsule; Refill: 5  4. Chronic pain syndrome - Lumbar Epidural Injection; Future - gabapentin (NEURONTIN) 300 MG capsule; Take 1 capsule (300 mg total) by mouth 2 (two) times daily.  Dispense: 60 capsule; Refill: 5  Continue home stretching exercises Continue gabapentin as prescribed  Follow-up plan:   Return in about 6 days (around 12/20/2020) for Left L3/4 ESI , minimal sedation (PO Valium).     Left L3-L4 ESI 05/17/2020     Recent Visits Date Type Provider Dept  12/12/20 Appointment Gillis Santa, MD [facility]  Showing recent visits within past 90 days and meeting all other requirements Today's Visits Date Type Provider Dept  12/14/20 Telemedicine Gillis Santa, MD [facility]  Showing today's visits and meeting all other requirements Future Appointments No visits were found meeting these conditions. Showing future appointments within next 90 days and meeting all other requirements I discussed the assessment and treatment plan with the patient. The patient was provided an opportunity to ask questions and all were answered. The patient agreed with the plan and demonstrated an understanding of the instructions.  Patient advised to call back or seek an in-person evaluation if the symptoms or condition  worsens.  Duration of encounter:  72mnutes.  Note by: BGillis Santa MD Date: 12/14/2020; Time: 9:26 AM

## 2020-12-20 ENCOUNTER — Ambulatory Visit (HOSPITAL_BASED_OUTPATIENT_CLINIC_OR_DEPARTMENT_OTHER): Payer: Medicare Other | Admitting: Student in an Organized Health Care Education/Training Program

## 2020-12-20 ENCOUNTER — Other Ambulatory Visit: Payer: Self-pay

## 2020-12-20 ENCOUNTER — Ambulatory Visit
Admission: RE | Admit: 2020-12-20 | Discharge: 2020-12-20 | Disposition: A | Discharge status: Home / Self Care | Payer: Medicare Other | Source: Ambulatory Visit | Attending: Student in an Organized Health Care Education/Training Program | Admitting: Student in an Organized Health Care Education/Training Program

## 2020-12-20 ENCOUNTER — Encounter: Payer: Self-pay | Admitting: Student in an Organized Health Care Education/Training Program

## 2020-12-20 VITALS — BP 109/74 | HR 72 | Temp 97.1°F | Resp 20 | Ht 69.0 in | Wt 238.0 lb

## 2020-12-20 DIAGNOSIS — M5416 Radiculopathy, lumbar region: Secondary | ICD-10-CM | POA: Diagnosis present

## 2020-12-20 DIAGNOSIS — G894 Chronic pain syndrome: Secondary | ICD-10-CM | POA: Insufficient documentation

## 2020-12-20 DIAGNOSIS — M47816 Spondylosis without myelopathy or radiculopathy, lumbar region: Secondary | ICD-10-CM | POA: Diagnosis present

## 2020-12-20 DIAGNOSIS — M48062 Spinal stenosis, lumbar region with neurogenic claudication: Secondary | ICD-10-CM

## 2020-12-20 MED ORDER — ROPIVACAINE HCL 2 MG/ML IJ SOLN
INTRAMUSCULAR | Status: AC
  Filled 2020-12-20: qty 20

## 2020-12-20 MED ORDER — DEXAMETHASONE SODIUM PHOSPHATE 10 MG/ML IJ SOLN
INTRAMUSCULAR | Status: AC
  Filled 2020-12-20: qty 1

## 2020-12-20 MED ORDER — ROPIVACAINE HCL 2 MG/ML IJ SOLN
2.0000 mL | Freq: Once | INTRAMUSCULAR | Status: AC
  Administered 2020-12-20: 2 mL via EPIDURAL

## 2020-12-20 MED ORDER — LIDOCAINE HCL (PF) 2 % IJ SOLN
INTRAMUSCULAR | Status: AC
  Filled 2020-12-20: qty 20

## 2020-12-20 MED ORDER — DEXAMETHASONE SODIUM PHOSPHATE 10 MG/ML IJ SOLN
10.0000 mg | Freq: Once | INTRAMUSCULAR | Status: AC
  Administered 2020-12-20: 10 mg

## 2020-12-20 MED ORDER — SODIUM CHLORIDE 0.9% FLUSH
2.0000 mL | Freq: Once | INTRAVENOUS | Status: AC
  Administered 2020-12-20: 2 mL

## 2020-12-20 MED ORDER — IOHEXOL 180 MG/ML  SOLN
10.0000 mL | Freq: Once | INTRAMUSCULAR | Status: AC
  Administered 2020-12-20: 5 mL via EPIDURAL

## 2020-12-20 MED ORDER — DIAZEPAM 5 MG PO TABS
ORAL_TABLET | ORAL | Status: AC
  Filled 2020-12-20: qty 1

## 2020-12-20 MED ORDER — DIAZEPAM 5 MG PO TABS
5.0000 mg | ORAL_TABLET | Freq: Once | ORAL | Status: AC
  Administered 2020-12-20: 5 mg via ORAL

## 2020-12-20 MED ORDER — SODIUM CHLORIDE (PF) 0.9 % IJ SOLN
INTRAMUSCULAR | Status: AC
  Filled 2020-12-20: qty 10

## 2020-12-20 MED ORDER — LIDOCAINE HCL 2 % IJ SOLN
20.0000 mL | Freq: Once | INTRAMUSCULAR | Status: AC
  Administered 2020-12-20: 200 mg

## 2020-12-20 NOTE — Progress Notes (Signed)
PROVIDER NOTE: Information contained herein reflects review and annotations entered in association with encounter. Interpretation of such information and data should be left to medically-trained personnel. Information provided to patient can be located elsewhere in the medical record under "Patient Instructions". Document created using STT-dictation technology, any transcriptional errors that may result from process are unintentional.    Patient: Emily Stokes  Service Category: Procedure  Provider: Gillis Santa, MD  DOB: 08-May-1961  DOS: 12/20/2020  Location: [facility]  MRN: [id number]  Setting: Ambulatory - outpatient  Referring Provider: Margo Common, PA-C  Type: Established Patient  Specialty: Interventional Pain Management  PCP: Margo Common, PA-C   Primary Reason for Visit: Interventional Pain Management Treatment. CC: Hip Pain (Left to thigh)  Procedure:          Anesthesia, Analgesia, Anxiolysis:  Type: Therapeutic Inter-Laminar Epidural Steroid Injection  #2 for 2022  Region: Lumbar Level: L3-4 Level. Laterality: Left         Type: Local Anesthesia with p.o. Valium  Local Anesthetic: Lidocaine 1-2%  Position: Prone with head of the table was raised to facilitate breathing.   Indications: 1. Lumbar radiculopathy (left L3/4)   2. Spinal stenosis, lumbar region, with neurogenic claudication   3. Lumbar spondylosis   4. Chronic pain syndrome     Pain Score: Pre-procedure: 9 /10 Post-procedure: 0-No pain/10   Pre-op H&P Assessment:  Emily Stokes is a 60 y.o. (year old), female patient, seen today for interventional treatment. She  has a past surgical history that includes Colonoscopy (1997); Partial hysterectomy (2009); Cesarean section; Lithotripsy (1997); mrsa; Hernia repair (2012); Abdominal hysterectomy; Hartmann's procedure.; Colostomy takedown (2012); Colonoscopy with propofol (N/A, 01/09/2018); and Colonoscopy with propofol (N/A, 08/20/2019). Ms.  Stokes has a current medication list which includes the following prescription(s): acetaminophen, amlodipine, aspirin, atorvastatin, gabapentin, onetouch verio, ibuprofen-diphenhydramine cit, onetouch delica plus [id number], metformin, and metoprolol tartrate. Her primarily concern today is the Hip Pain (Left to thigh)  Initial Vital Signs:  Pulse/HCG Rate: 74  Temp: (!) 97.1 F (36.2 C) Resp: 16 BP: 120/67 SpO2: 98 %  BMI: Estimated body mass index is 35.15 kg/m as calculated from the following:   Height as of this encounter: '5\' 9"'$  (1.753 m).   Weight as of this encounter: 238 lb (108 kg).  Risk Assessment: Allergies: Reviewed. She is allergic to bactrim [sulfamethoxazole-trimethoprim].  Allergy Precautions: None required Coagulopathies: Reviewed. None identified.  Blood-thinner therapy: None at this time Active Infection(s): Reviewed. None identified. Emily Stokes is afebrile  Site Confirmation: Emily Stokes was asked to confirm the procedure and laterality before marking the site Procedure checklist: Completed Consent: Before the procedure and under the influence of no sedative(s), amnesic(s), or anxiolytics, the patient was informed of the treatment options, risks and possible complications. To fulfill our ethical and legal obligations, as recommended by the American Medical Association's Code of Ethics, I have informed the patient of my clinical impression; the nature and purpose of the treatment or procedure; the risks, benefits, and possible complications of the intervention; the alternatives, including doing nothing; the risk(s) and benefit(s) of the alternative treatment(s) or procedure(s); and the risk(s) and benefit(s) of doing nothing. The patient was provided information about the general risks and possible complications associated with the procedure. These may include, but are not limited to: failure to achieve desired goals, infection, bleeding, organ or nerve damage, allergic  reactions, paralysis, and death. In addition, the patient was informed of those risks and complications associated to [facility]  procedures, such as failure to decrease pain; infection (i.e.: Meningitis, epidural or intraspinal abscess); bleeding (i.e.: epidural hematoma, subarachnoid hemorrhage, or any other type of intraspinal or peri-dural bleeding); organ or nerve damage (i.e.: Any type of peripheral nerve, nerve root, or spinal cord injury) with subsequent damage to sensory, motor, and/or autonomic systems, resulting in permanent pain, numbness, and/or weakness of one or several areas of the body; allergic reactions; (i.e.: anaphylactic reaction); and/or death. Furthermore, the patient was informed of those risks and complications associated with the medications. These include, but are not limited to: allergic reactions (i.e.: anaphylactic or anaphylactoid reaction(s)); adrenal axis suppression; blood sugar elevation that in diabetics may result in ketoacidosis or comma; water retention that in patients with history of congestive heart failure may result in shortness of breath, pulmonary edema, and decompensation with resultant heart failure; weight gain; swelling or edema; medication-induced neural toxicity; particulate matter embolism and blood vessel occlusion with resultant organ, and/or nervous system infarction; and/or aseptic necrosis of one or more joints. Finally, the patient was informed that Medicine is not an exact science; therefore, there is also the possibility of unforeseen or unpredictable risks and/or possible complications that may result in a catastrophic outcome. The patient indicated having understood very clearly. We have given the patient no guarantees and we have made no promises. Enough time was given to the patient to ask questions, all of which were answered to the patient's satisfaction. Emily Stokes has indicated that she wanted to continue with the procedure. Attestation: I,  the ordering provider, attest that I have discussed with the patient the benefits, risks, side-effects, alternatives, likelihood of achieving goals, and potential problems during recovery for the procedure that I have provided informed consent. Date  Time: 12/20/2020  8:46 AM  Pre-Procedure Preparation:  Monitoring: As per clinic protocol. Respiration, ETCO2, SpO2, BP, heart rate and rhythm monitor placed and checked for adequate function Safety Precautions: Patient was assessed for positional comfort and pressure points before starting the procedure. Time-out: I initiated and conducted the "Time-out" before starting the procedure, as per protocol. The patient was asked to participate by confirming the accuracy of the "Time Out" information. Verification of the correct person, site, and procedure were performed and confirmed by me, the nursing staff, and the patient. "Time-out" conducted as per Joint Commission's Universal Protocol (UP.01.01.01). Time: 0923  Description of Procedure:          Target Area: The interlaminar space, initially targeting the lower laminar border of the superior vertebral body. Approach: Paramedial approach. Area Prepped: Entire Posterior Lumbar Region DuraPrep (Iodine Povacrylex [0.7% available iodine] and Isopropyl Alcohol, 74% w/w) Safety Precautions: Aspiration looking for blood return was conducted prior to all injections. At no point did we inject any substances, as a needle was being advanced. No attempts were made at seeking any paresthesias. Safe injection practices and needle disposal techniques used. Medications properly checked for expiration dates. SDV (single dose vial) medications used. Description of the Procedure: Protocol guidelines were followed. The procedure needle was introduced through the skin, ipsilateral to the reported pain, and advanced to the target area. Bone was contacted and the needle walked caudad, until the lamina was cleared. The epidural  space was identified using "loss-of-resistance technique" with 2-3 ml of PF-NaCl (0.9% NSS), in a 5cc LOR glass syringe.  Vitals:   12/20/20 0849 12/20/20 0920 12/20/20 0930  BP: 120/67 120/73 109/74  Pulse: 74 73 72  Resp: '16 18 20  '$ Temp: (!) 97.1 F (36.2 C)  TempSrc: Temporal    SpO2: 98% 98% 98%  Weight: 238 lb (108 kg)    Height: '5\' 9"'$  (1.753 m)      Start Time: 0923 hrs. End Time: 0930 hrs.  Materials:  Needle(s) Type: Epidural needle Gauge: 22G Length: 3.5-in Medication(s): Please see orders for medications and dosing details. 6 cc solution made of 3 cc of preservative-free saline, 2 cc of 0.2% ropivacaine, 1 cc of Decadron 10 mg/cc.  Imaging Guidance (Spinal):          Type of Imaging Technique: Fluoroscopy Guidance (Spinal) Indication(s): Assistance in needle guidance and placement for procedures requiring needle placement in or near specific anatomical locations not easily accessible without such assistance. Exposure Time: Please see nurses notes. Contrast: Before injecting any contrast, we confirmed that the patient did not have an allergy to iodine, shellfish, or radiological contrast. Once satisfactory needle placement was completed at the desired level, radiological contrast was injected. Contrast injected under live fluoroscopy. No contrast complications. See chart for type and volume of contrast used. Fluoroscopic Guidance: I was personally present during the use of fluoroscopy. "Tunnel Vision Technique" used to obtain the best possible view of the target area. Parallax error corrected before commencing the procedure. "Direction-depth-direction" technique used to introduce the needle under continuous pulsed fluoroscopy. Once target was reached, antero-posterior, oblique, and lateral fluoroscopic projection used confirm needle placement in all planes. Images permanently stored in EMR. Interpretation: I personally interpreted the imaging intraoperatively. Adequate  needle placement confirmed in multiple planes. Appropriate spread of contrast into desired area was observed. No evidence of afferent or efferent intravascular uptake. No intrathecal or subarachnoid spread observed. Permanent images saved into the patient's record.   Post-operative Assessment:  Post-procedure Vital Signs:  Pulse/HCG Rate: 72  Temp: (!) 97.1 F (36.2 C) Resp: 20 BP: 109/74 SpO2: 98 %  EBL: None  Complications: No immediate post-treatment complications observed by team, or reported by patient.  Note: The patient tolerated the entire procedure well. A repeat set of vitals were taken after the procedure and the patient was kept under observation following institutional policy, for this type of procedure. Post-procedural neurological assessment was performed, showing return to baseline, prior to discharge. The patient was provided with post-procedure discharge instructions, including a section on how to identify potential problems. Should any problems arise concerning this procedure, the patient was given instructions to immediately contact us, at any time, without hesitation. In any case, we plan to contact the patient by telephone for a follow-up status report regarding this interventional procedure.  Comments:  No additional relevant information.  Plan of Care  Orders:  Orders Placed This Encounter  Procedures   DG PAIN CLINIC C-ARM 1-60 MIN NO REPORT    Intraoperative interpretation by procedural physician at [place].    Standing Status:   Standing    Number of Occurrences:   1    Order Specific Question:   Reason for exam:    Answer:   Assistance in needle guidance and placement for procedures requiring needle placement in or near specific anatomical locations not easily accessible without such assistance.     Medications ordered for procedure: Meds ordered this encounter  Medications   iohexol (OMNIPAQUE) 180 MG/ML injection 10 mL    Must be  Myelogram-compatible. If not available, you may substitute with a water-soluble, non-ionic, hypoallergenic, myelogram-compatible radiological contrast medium.   lidocaine (XYLOCAINE) 2 % (with pres) injection 400 mg   ropivacaine (PF) 2 mg/mL (0.2%) (NAROPIN) injection 2  mL   sodium chloride flush (NS) 0.9 % injection 2 mL   dexamethasone (DECADRON) injection 10 mg   diazepam (VALIUM) tablet 5 mg    Medications administered: We administered iohexol, lidocaine, ropivacaine (PF) 2 mg/mL (0.2%), sodium chloride flush, dexamethasone, and diazepam.  See the medical record for exact dosing, route, and time of administration.  Follow-up plan:   Return in about 4 weeks (around 01/17/2021) for Post Procedure Evaluation, virtual.      Left L3-L4 ESI 05/17/2020, 12/20/20   Recent Visits Date Type Provider Dept  12/14/20 Telemedicine Gillis Santa, MD [facility]  Showing recent visits within past 90 days and meeting all other requirements Today's Visits Date Type Provider Dept  12/20/20 Procedure visit Gillis Santa, MD [facility]  Showing today's visits and meeting all other requirements Future Appointments Date Type Provider Dept  01/17/21 Appointment Gillis Santa, MD [facility]  Showing future appointments within next 90 days and meeting all other requirements Disposition: Discharge home  Discharge (Date  Time): 12/20/2020;   hrs.   Primary Care Physician: Margo Common, PA-C Location: [facility] Note by: Gillis Santa, MD Date: 12/20/2020; Time: 9:49 AM  Disclaimer:  Medicine is not an exact science. The only guarantee in medicine is that nothing is guaranteed. It is important to note that the decision to proceed with this intervention was based on the information collected from the patient. The Data and conclusions were drawn from the patient's questionnaire, the interview, and the physical examination. Because the information  was provided in large part by the patient, it cannot be guaranteed that it has not been purposely or unconsciously manipulated. Every effort has been made to obtain as much relevant data as possible for this evaluation. It is important to note that the conclusions that lead to this procedure are derived in large part from the available data. Always take into account that the treatment will also be dependent on availability of resources and existing treatment guidelines, considered by other Pain Management Practitioners as being common knowledge and practice, at the time of the intervention. For Medico-Legal purposes, it is also important to point out that variation in procedural techniques and pharmacological choices are the acceptable norm. The indications, contraindications, technique, and results of the above procedure should only be interpreted and judged by a Board-Certified Interventional Pain Specialist with extensive familiarity and expertise in the same exact procedure and technique.

## 2020-12-20 NOTE — Progress Notes (Signed)
Safety precautions to be maintained throughout the outpatient stay will include: orient to surroundings, keep bed in low position, maintain call bell within reach at all times, provide assistance with transfer out of bed and ambulation.  

## 2020-12-20 NOTE — Progress Notes (Signed)
Watch Blood sugars  patient states it elevate in the 600's last procedure.  Instructed to to watch BS and call if increased.

## 2020-12-21 ENCOUNTER — Telehealth: Payer: Self-pay

## 2020-12-21 NOTE — Telephone Encounter (Signed)
Post procedure phone call.  Patient states she is doing ok but blood sugar is 500 this morning.  She is getting ready to call her PCP.  Dr Holley Raring notified.

## 2021-01-09 ENCOUNTER — Telehealth: Payer: Self-pay | Admitting: Family Medicine

## 2021-01-09 ENCOUNTER — Other Ambulatory Visit: Payer: Self-pay | Admitting: Family Medicine

## 2021-01-09 DIAGNOSIS — E781 Pure hyperglyceridemia: Secondary | ICD-10-CM

## 2021-01-09 MED ORDER — OMEGA-3-ACID ETHYL ESTERS 1 G PO CAPS: 1.0000 g | ORAL_CAPSULE | Freq: Two times a day (BID) | ORAL | 3 refills | Status: DC

## 2021-01-09 NOTE — Telephone Encounter (Signed)
I don't see this on her med list.  Please advise.   Thanks,   -Mickel Baas

## 2021-01-09 NOTE — Telephone Encounter (Signed)
Sent refill to the [facility] for diagnosis of hypertriglyceridemia history.

## 2021-01-09 NOTE — Telephone Encounter (Signed)
[facility] faxed refill request for the following medications:   OMEGA-3 ACID ETHYL ESTERS CAPSULES 1GM   Please advise.

## 2021-01-16 ENCOUNTER — Telehealth: Payer: Self-pay | Admitting: Family Medicine

## 2021-01-16 NOTE — Telephone Encounter (Signed)
[facility] faxed refill request for the following medications:    Error . wrong provider   Please advise.

## 2021-01-17 ENCOUNTER — Other Ambulatory Visit: Payer: Self-pay

## 2021-01-17 ENCOUNTER — Ambulatory Visit
Payer: Medicare HMO | Attending: Student in an Organized Health Care Education/Training Program | Admitting: Student in an Organized Health Care Education/Training Program

## 2021-01-17 DIAGNOSIS — M5416 Radiculopathy, lumbar region: Secondary | ICD-10-CM

## 2021-01-17 NOTE — Progress Notes (Signed)
I attempted to call the patient however no response. Voicemail left instructing patient to call front desk office at [phone] to reschedule appointment. -Dr Holley Raring   Post-Procedure Evaluation  Procedure (12/20/2020):  Type: Therapeutic Inter-Laminar Epidural Steroid Injection  #2 for 2022  Region: Lumbar Level: L3-4 Level. Laterality: Left   Anxiolysis: Please see nurses note.  Effectiveness during initial hour after procedure (Ultra-Short Term Relief): 100 %  Local anesthetic used: Long-acting (4-6 hours) Effectiveness: Defined as any analgesic benefit obtained secondary to the administration of local anesthetics. This carries significant diagnostic value as to the etiological location, or anatomical origin, of the pain. Duration of benefit is expected to coincide with the duration of the local anesthetic used.  Effectiveness during initial 4-6 hours after procedure (Short-Term Relief): 100 %   Long-term benefit: Defined as any relief past the pharmacologic duration of the local anesthetics.  Effectiveness past the initial 6 hours after procedure (Long-Term Relief): 100 % (2-3 days)

## 2021-01-23 ENCOUNTER — Other Ambulatory Visit: Payer: Self-pay | Admitting: Family Medicine

## 2021-01-23 NOTE — Telephone Encounter (Signed)
Requested medications are due for refill today.  yes  Requested medications are on the active medications list.  yes  Last refill. 12/11/2020  Future visit scheduled.   no  Notes to clinic.  Labs are expired.

## 2021-01-25 ENCOUNTER — Other Ambulatory Visit: Payer: Self-pay | Admitting: Family Medicine

## 2021-01-26 ENCOUNTER — Telehealth: Payer: Self-pay

## 2021-01-26 NOTE — Telephone Encounter (Signed)
Requested medication (s) are due for refill today:  Yes  Requested medication (s) are on the active medication list:   Yes  Future visit scheduled:   No   Last ordered: 01/25/2021 #30, 0 refills  Returned because note requires pt have an appt.   30 day courtesy refill given yesterday 9/15.   Pt to see [name], NP.   [facility] does not schedule for her.   Needs appt. For refills.   Requested Prescriptions  Pending Prescriptions Disp Refills   atorvastatin (LIPITOR) 20 MG tablet [Pharmacy Med Name: ATORVASTATIN '20MG'$  TAB 20 Tablet] 30 tablet 10    Sig: TAKE 1 TABLET BY MOUTH DAILY *PATIENT NEEDS APPOINTMENT*     Cardiovascular:  Antilipid - Statins Failed - 01/25/2021  6:31 PM      Failed - Total Cholesterol in normal range and within 360 days    Cholesterol, Total  Date Value Ref Range Status  07/28/2019 163 100 - 199 mg/dL Final          Failed - LDL in normal range and within 360 days    LDL Chol Calc (NIH)  Date Value Ref Range Status  07/28/2019 107 (H) 0 - 99 mg/dL Final          Failed - HDL in normal range and within 360 days    HDL  Date Value Ref Range Status  07/28/2019 40 >39 mg/dL Final          Failed - Triglycerides in normal range and within 360 days    Triglycerides  Date Value Ref Range Status  07/28/2019 82 0 - 149 mg/dL Final          Passed - Patient is not pregnant      Passed - Valid encounter within last 12 months    Recent Outpatient Visits           1 month ago Acute cystitis without hematuria   [place], [name], PA-C   7 months ago Uncontrolled type 2 diabetes mellitus with hyperglycemia [facility])   [place], [name], PA-C   8 months ago Urinary incontinence, unspecified type   [name], [name], PA-C   10 months ago [place], [name], PA-C   11 months ago Urinary urgency   [name], [name], [place]

## 2021-01-26 NOTE — Telephone Encounter (Signed)
Copied from [place] [id number]. Topic: General - Other >> Jan 26, 2021  1:51 PM Leward Quan A wrote: Reason for CRM: Patient called in to inform PCP that she may have another UTI,states that she have frequent urination, strong odor and just cant seem to hold her urine with the urges. Please advise can be reached at Ph#  [phone]

## 2021-01-29 ENCOUNTER — Other Ambulatory Visit: Payer: Self-pay

## 2021-01-29 ENCOUNTER — Ambulatory Visit (INDEPENDENT_AMBULATORY_CARE_PROVIDER_SITE_OTHER): Payer: Medicare HMO | Admitting: Family Medicine

## 2021-01-29 ENCOUNTER — Encounter: Payer: Self-pay | Admitting: Family Medicine

## 2021-01-29 VITALS — BP 138/87 | HR 79 | Temp 97.0°F | Resp 18 | Wt 223.0 lb

## 2021-01-29 DIAGNOSIS — N3000 Acute cystitis without hematuria: Secondary | ICD-10-CM | POA: Diagnosis not present

## 2021-01-29 DIAGNOSIS — E119 Type 2 diabetes mellitus without complications: Secondary | ICD-10-CM | POA: Diagnosis not present

## 2021-01-29 DIAGNOSIS — R350 Frequency of micturition: Secondary | ICD-10-CM | POA: Diagnosis not present

## 2021-01-29 LAB — POCT URINALYSIS DIPSTICK
Bilirubin, UA: NEGATIVE
Glucose, UA: POSITIVE — AB
Ketones, UA: NEGATIVE
Nitrite, UA: POSITIVE
Protein, UA: POSITIVE — AB
Spec Grav, UA: 1.02 (ref 1.010–1.025)
Urobilinogen, UA: 0.2 E.U./dL
pH, UA: 6 (ref 5.0–8.0)

## 2021-01-29 LAB — POCT GLYCOSYLATED HEMOGLOBIN (HGB A1C)
Est. average glucose Bld gHb Est-mCnc: 266
Hemoglobin A1C: 10.9 % — AB (ref 4.0–5.6)

## 2021-01-29 MED ORDER — GLIPIZIDE ER 2.5 MG PO TB24: 2.5000 mg | ORAL_TABLET | Freq: Every day | ORAL | 1 refills | Status: DC

## 2021-01-29 MED ORDER — CIPROFLOXACIN HCL 500 MG PO TABS: 500.0000 mg | ORAL_TABLET | Freq: Two times a day (BID) | ORAL | 0 refills | Status: AC

## 2021-01-29 NOTE — Patient Instructions (Signed)
Please review the attached list of medications and notify my office if there are any errors.   Start taking one tablet of glipizide every morning in addition to your current dose of metformin

## 2021-01-29 NOTE — Progress Notes (Signed)
Established patient visit   Patient: Emily Stokes   DOB: 06-30-1960   60 y.o. Female  MRN: [id number] Visit Date: 01/29/2021  Today's healthcare provider: Lelon Huh, MD   Chief Complaint  Patient presents with   Urinary Frequency   Subjective    Urinary Frequency  This is a new problem. Episode onset: 1 week ago. The problem has been gradually worsening. There has been no fever. Associated symptoms include frequency and urgency. Pertinent negatives include no chills, nausea or vomiting. She has tried nothing for the symptoms.    She is also overdue for diabetes follow up. Reports home sugars usually in the 130s to 140s. Last A1c in January was 14.9. She reports she is now taking metformin consistently and tolerating well.      Medications: Outpatient Medications Prior to Visit  Medication Sig   acetaminophen (TYLENOL) 500 MG tablet Take 500 mg by mouth every 6 (six) hours as needed.   amLODipine (NORVASC) 10 MG tablet TAKE 1 TABLET BY MOUTH DAILY   aspirin 81 MG tablet Take 81 mg by mouth daily.   atorvastatin (LIPITOR) 20 MG tablet Take 1 tablet (20 mg total) by mouth daily. Please schedule an office visit before anymore refills.   gabapentin (NEURONTIN) 300 MG capsule Take 1 capsule (300 mg total) by mouth 2 (two) times daily.   glucose blood (ONETOUCH VERIO) test strip USE AS DIRECTED TO TEST BLOOD GLUCOSE BEFORE BREAKFAST. RECHECK IF ANY SYMPTOMS OF HYPOGLYCEMIA   Ibuprofen-diphenhydrAMINE Cit (IBUPROFEN PM PO) Take by mouth at bedtime as needed.   Lancets (ONETOUCH DELICA PLUS Q000111Q) MISC USE AS DIRECTED TWICE DAILY IN THE MORNING AND AT BEDTIME   metFORMIN (GLUCOPHAGE) 500 MG tablet Take 2 tablets (1,000 mg total) by mouth 2 (two) times daily with a meal.   metoprolol tartrate (LOPRESSOR) 25 MG tablet TAKE ONE (1) TABLET BY MOUTH TWICE DAILY   omega-3 acid ethyl esters (LOVAZA) 1 g capsule Take 1 capsule (1 g total) by mouth 2 (two) times daily.   No  facility-administered medications prior to visit.    Review of Systems  Constitutional:  Negative for appetite change, chills, fatigue and fever.  Respiratory:  Negative for chest tightness and shortness of breath.   Cardiovascular:  Negative for chest pain and palpitations.  Gastrointestinal:  Negative for abdominal pain, nausea and vomiting.  Genitourinary:  Positive for frequency and urgency.       Abnormal urine odor and urinary incontinence  Neurological:  Negative for dizziness and weakness.      Objective    BP 138/87 (BP Location: Left Arm, Patient Position: Sitting, Cuff Size: Large)   Pulse 79   Temp (!) 97 F (36.1 C) (Temporal)   Resp 18   Wt 223 lb (101.2 kg)   BMI 32.93 kg/m  {Show previous vital signs (optional):23777}  Physical Exam   General appearance: Mildly obese female, cooperative and in no acute distress Head: Normocephalic, without obvious abnormality, atraumatic Respiratory: Respirations even and unlabored, normal respiratory rate Extremities: All extremities are intact.  Skin: Skin color, texture, turgor normal. No rashes seen  Psych: Appropriate mood and affect. Neurologic: Mental status: Alert, oriented to person, place, and time, thought content appropriate.   Results for orders placed or performed in visit on 01/29/21  POCT Urinalysis Dipstick  Result Value Ref Range   Color, UA yellow    Clarity, UA cloudy    Glucose, UA Positive (A) Negative   Bilirubin, UA  negative    Ketones, UA negative    Spec Grav, UA 1.020 1.010 - 1.025   Blood, UA moderate (non hemolyzed)    pH, UA 6.0 5.0 - 8.0   Protein, UA Positive (A) Negative   Urobilinogen, UA 0.2 0.2 or 1.0 E.U./dL   Nitrite, UA positive    Leukocytes, UA Moderate (2+) (A) Negative   Appearance     Odor    POCT HgB A1C  Result Value Ref Range   Hemoglobin A1C 10.9 (A) 4.0 - 5.6 %   Est. average glucose Bld gHb Est-mCnc 266     Assessment & Plan     1. Urinary frequency  -  Urine Culture  2. Acute cystitis without hematuria  - ciprofloxacin (CIPRO) 500 MG tablet; Take 1 tablet (500 mg total) by mouth 2 (two) times daily for 10 days.  Dispense: 20 tablet; Refill: 0 - Urine Culture  3. Type 2 diabetes mellitus without complication, without long-term current use of insulin ([place]) Improved since starting taking metformin consistently after her January appointment, but not controlled. Will add- glipiZIDE (GLIPIZIDE XL) 2.5 MG 24 hr tablet; Take 1 tablet (2.5 mg total) by mouth daily with breakfast.  Dispense: 30 tablet; Refill: 1   Consider a GLP-1 agonist or SGLT-1 inhibitor.  Is to establish  and follow up with [name] in 2 months.       The entirety of the information documented in the History of Present Illness, Review of Systems and Physical Exam were personally obtained by me. Portions of this information were initially documented by the CMA and reviewed by me for thoroughness and accuracy.     Lelon Huh, MD  [facility] [phone] (phone) [phone] (fax)  [place]

## 2021-02-04 ENCOUNTER — Encounter: Payer: Self-pay | Admitting: Family Medicine

## 2021-02-05 LAB — URINE CULTURE

## 2021-02-07 ENCOUNTER — Telehealth: Payer: Self-pay

## 2021-02-07 MED ORDER — NITROFURANTOIN MONOHYD MACRO 100 MG PO CAPS: 100.0000 mg | ORAL_CAPSULE | Freq: Two times a day (BID) | ORAL | 0 refills | Status: DC

## 2021-02-07 NOTE — Telephone Encounter (Signed)
-----   Message from Birdie Sons, MD sent at 02/05/2021  7:55 AM EDT ----- Infection is resistant to Cipro. Need to change to nitrofurantoin 100mg  twice a day for 7 days.

## 2021-02-07 NOTE — Telephone Encounter (Signed)
Pt advised. RX sent to  [facility] on [address]   [name],   Emily Stokes

## 2021-02-12 ENCOUNTER — Telehealth: Payer: Self-pay

## 2021-02-12 NOTE — Telephone Encounter (Signed)
  Care Management   Follow Up Note   02/12/2021 Name: Emily Stokes MRN: [id number] DOB: 16-Jul-1960   Primary Care Primary: Gwyneth Sprout, FNP Reason for referral : Chronic Care Management   An unsuccessful telephone outreach was attempted today. The patient was referred to the case management team for assistance with care management and care coordination.    Follow Up Plan:  A HIPAA compliant voice message was left today requesting a return call.    [name] Care Management [facility] [phone]

## 2021-02-21 ENCOUNTER — Other Ambulatory Visit: Payer: Self-pay | Admitting: Family Medicine

## 2021-02-21 DIAGNOSIS — E119 Type 2 diabetes mellitus without complications: Secondary | ICD-10-CM

## 2021-02-22 NOTE — Telephone Encounter (Signed)
Requested Prescriptions  Pending Prescriptions Disp Refills  . glipiZIDE (GLUCOTROL XL) 2.5 MG 24 hr tablet [Pharmacy Med Name: GLIPIZIDE ER 2.5 MG TABLET] 30 tablet 1    Sig: TAKE 1 TABLET BY MOUTH DAILY WITH BREAKFAST.     Endocrinology:  Diabetes - Sulfonylureas Failed - 02/21/2021  2:31 PM      Failed - HBA1C is between 0 and 7.9 and within 180 days    Hemoglobin A1C  Date Value Ref Range Status  01/29/2021 10.9 (A) 4.0 - 5.6 % Final   Hgb A1c MFr Bld  Date Value Ref Range Status  06/06/2020 14.9 (H) 4.8 - 5.6 % Final    Comment:             Prediabetes: 5.7 - 6.4          Diabetes: >6.4          Glycemic control for adults with diabetes: <7.0          Passed - Valid encounter within last 6 months    Recent Outpatient Visits          3 weeks ago Urinary frequency   [facility] Birdie Sons, MD   2 months ago Acute cystitis without hematuria   [place], [name], PA-C   8 months ago Uncontrolled type 2 diabetes mellitus with hyperglycemia [facility])   [place], PA-C   9 months ago Urinary incontinence, unspecified type   [place], PA-C   11 months ago [place], [name], PA-C      Future Appointments            In 1 month Rollene Rotunda, Jaci Standard, [place], [place]

## 2021-03-01 ENCOUNTER — Telehealth: Payer: Self-pay

## 2021-03-01 NOTE — Telephone Encounter (Signed)
  Care Management   Follow Up Note   03/01/2021 Name: Emily Stokes MRN: [id number] DOB: 1960/12/04   Primary Care Provider: Gwyneth Sprout, FNP Reason for referral : Chronic Care Management   An unsuccessful telephone outreach was attempted today. The patient was referred to the case management team for assistance with care management and care coordination.    Follow Up Plan:  A HIPAA compliant voice message was left today requesting a return call.   [name] Care Management [facility] [phone]

## 2021-03-08 ENCOUNTER — Other Ambulatory Visit: Payer: Self-pay | Admitting: Family Medicine

## 2021-03-08 DIAGNOSIS — E119 Type 2 diabetes mellitus without complications: Secondary | ICD-10-CM

## 2021-03-13 ENCOUNTER — Ambulatory Visit (INDEPENDENT_AMBULATORY_CARE_PROVIDER_SITE_OTHER): Payer: Medicare Other

## 2021-03-13 ENCOUNTER — Telehealth: Payer: Self-pay

## 2021-03-13 ENCOUNTER — Telehealth: Payer: Medicare HMO

## 2021-03-13 DIAGNOSIS — E119 Type 2 diabetes mellitus without complications: Secondary | ICD-10-CM

## 2021-03-13 DIAGNOSIS — I10 Essential (primary) hypertension: Secondary | ICD-10-CM

## 2021-03-13 NOTE — Chronic Care Management (AMB) (Signed)
Chronic Care Management   CCM RN Visit Note  03/13/2021 Name: Emily Stokes MRN: [id number] DOB: 1960/10/29  Subjective: Emily Stokes is a 60 y.o. year old female who is a primary care patient of Gwyneth Sprout, FNP. The care management team was consulted for assistance with disease management and care coordination needs.    Engaged with patient by telephone for follow up visit in response to provider referral for case management and care coordination services.   Consent to Services:  The patient was given information about Chronic Care Management services, agreed to services, and gave verbal consent prior to initiation of services.  Please see initial visit note for detailed documentation.    Assessment: Review of patient past medical history, allergies, medications, health status, including review of consultants reports, laboratory and other test data, was performed as part of comprehensive evaluation and provision of chronic care management services.   SDOH (Social Determinants of Health) assessments and interventions performed: No   CCM Care Plan  Allergies  Allergen Reactions   Bactrim [Sulfamethoxazole-Trimethoprim] Swelling    Outpatient Encounter Medications as of 03/13/2021  Medication Sig   acetaminophen (TYLENOL) 500 MG tablet Take 500 mg by mouth every 6 (six) hours as needed.   amLODipine (NORVASC) 10 MG tablet TAKE 1 TABLET BY MOUTH DAILY   aspirin 81 MG tablet Take 81 mg by mouth daily.   atorvastatin (LIPITOR) 20 MG tablet Take 1 tablet (20 mg total) by mouth daily. Please schedule an office visit before anymore refills.   gabapentin (NEURONTIN) 300 MG capsule Take 1 capsule (300 mg total) by mouth 2 (two) times daily.   glipiZIDE (GLUCOTROL XL) 2.5 MG 24 hr tablet TAKE 1 TABLET BY MOUTH EVERY DAY WITH BREAKFAST   glucose blood (ONETOUCH VERIO) test strip USE AS DIRECTED TO TEST BLOOD GLUCOSE BEFORE BREAKFAST. RECHECK IF ANY SYMPTOMS OF HYPOGLYCEMIA    Ibuprofen-diphenhydrAMINE Cit (IBUPROFEN PM PO) Take by mouth at bedtime as needed.   Lancets (ONETOUCH DELICA PLUS [id number]) MISC USE AS DIRECTED TWICE DAILY IN THE MORNING AND AT BEDTIME   metFORMIN (GLUCOPHAGE) 500 MG tablet Take 2 tablets (1,000 mg total) by mouth 2 (two) times daily with a meal.   metoprolol tartrate (LOPRESSOR) 25 MG tablet TAKE ONE (1) TABLET BY MOUTH TWICE DAILY   nitrofurantoin, macrocrystal-monohydrate, (MACROBID) 100 MG capsule Take 1 capsule (100 mg total) by mouth 2 (two) times daily.   omega-3 acid ethyl esters (LOVAZA) 1 g capsule Take 1 capsule (1 g total) by mouth 2 (two) times daily.   No facility-administered encounter medications on file as of 03/13/2021.    Patient Active Problem List   Diagnosis Date Noted   Spinal stenosis, lumbar region, with neurogenic claudication 04/27/2020   Personal history of colonic polyps    Polyp of transverse colon    Lumbar facet arthropathy 03/18/2018   Lumbar spondylosis 03/18/2018   Encounter for screening colonoscopy    Internal hemorrhoids    Benign neoplasm of descending colon    Benign neoplasm of cecum    Type 2 diabetes mellitus without complication, without long-term current use of insulin ([place]) 07/31/2017   Recurrent abdominal hernia without obstruction or gangrene 07/24/2017   Cellulitis of face 05/30/2016   Chronic pain syndrome 05/30/2016   Nasal septal abscess 05/30/2016   Pain in elbow 02/05/2016   Lumbar degenerative disc disease 09/14/2015   Facet syndrome, lumbar 09/14/2015   Lumbar radiculopathy 09/14/2015   Sacroiliac joint dysfunction 09/14/2015   History  of chicken pox 02/02/2015   Leg pain 02/02/2015   Hypercholesteremia 02/02/2015   Blood glucose elevated 02/02/2015   Pain in soft tissues of limb 02/02/2015   Nonspecific ST-T changes 02/02/2015   Awareness of heartbeats 02/02/2015   Pain in extremity at multiple sites 02/02/2015   Snores 02/02/2015   Hernia of anterior abdominal wall  02/24/2012   Pain in shoulder 12/05/2011   Adnexal mass 10/30/2011   Pelvic cyst 10/30/2011   Palpitations 03/14/2011   HTN (hypertension) 03/14/2011   Abnormal finding on EKG 03/14/2011   Obesity 03/14/2011   Infected surgical wound 09/15/2008   Urinary system disease 08/10/2008   Benign essential HTN 07/12/2008   Arthralgia of lower leg 03/28/2008   Anemia, iron deficiency 06/17/2007   Calculus of kidney 05/26/2007    Conditions to be addressed/monitored:HTN and DMII  Patient Care Plan: Diabetes Type 2 (Adult)     Problem Identified: Disease Progression (Diabetes, Type 2)      Long-Range Goal: Disease Progression Prevented or Minimized   Start Date: 12/11/2020  Expected End Date: 04/10/2021  Priority: High  Note:   Objective:  Lab Results  Component Value Date   HGBA1C 14.9 (H) 06/06/2020    Lab Results  Component Value Date   HGBA1C 10.9 (A) 01/29/2021    Current Barriers:  Chronic Disease Management support and educational needs related to Diabetes self-management.  Case Manager Clinical Goal(s):  Over the next 120 days, patient will demonstrate improved adherence to prescribed treatment plan for diabetes self care/management as evidenced by: taking medications as prescribed, daily monitoring and recording CBG's,  adherence to an ADA/ carb modified diet.   Interventions:  Collaboration with PCP regarding development and update of comprehensive plan of care as evidenced by provider attestation and co-signature Inter-disciplinary care team collaboration (see longitudinal plan of care) Discussed s/sx of hypoglycemia and hyperglycemia along with recommended interventions. Reviewed blood glucose readings. Reports numbers have improved but continues to have high readings in the 250's. She is very eager to achieve better glycemic control but prefers not to use insulin. Reports taking glipizide and metformin as prescribed. Overall A1C has improved. Decreased for 14.9% to  10.9%. Collaborated with the [facility] regarding patient's request to discuss medications for better glycemic control. The Pharmacist will reach out within the next few weeks. Discussed nutritional intake. Reports nutritional intake has improved but still attempting to monitor carb intake. Advised to continue reading nutrition labels and limiting intake of concentrated sugars. Advised to update the team if she is willing to speak with a dietician. Reviewed recommended diabetic exams. Continues to perform daily foot care. Denies significant changes with vision but notes an increased glare when readings. She is due for an eye exam. Advised to contact Optometry to schedule as soon as possible.   Self-Care/Patient Goals: Self administer medications as prescribed Attend all scheduled provider appointments Monitor blood glucose levels and record readings Engage in low impact activity as tolerated Adhere to prescribed ADA/carb modified Complete outreach with the CCM Pharmacist Contact Optometry to schedule an updated eye exam Contact clinic with questions and new concerns as needed   Follow Up Plan:  Will follow up in two months    Patient Care Plan: Hypertension (Adult)     Problem Identified: Hypertension (Hypertension)      Long-Range Goal: Hypertension Monitored Completed 03/13/2021  Start Date: 12/11/2020  Expected End Date: 04/10/2021  Priority: High  Note:    Current Barriers:  Chronic Disease Management support and educational needs  related to Hypertension and Dyslipidemia.  Case Manager Clinical Goal(s):  Over the next 120 days, patient will demonstrate improved adherence to prescribed treatment plan as evidenced by taking all medications as prescribed, monitoring and recording blood pressure and adhering to a low sodium/DASH diet.  Interventions:  Inter-disciplinary care team collaboration (see longitudinal plan of care) Reviewed compliance with current treatment plan.  Advised to continue taking medications as prescribed. Advised to update her  PCP if unable to tolerate regimen. Currently tolerating statin well. Reviewed established BP parameters and encouraged to monitor and record readings. Advised to continue monitoring sodium intake and attempt to avoid highly processed foods when possible. Reviewed s/sx of heart attack, stroke and worsening symptoms that require immediate medical attention.   Self-Care/Patient Goals: Self administer medications as prescribed Attend all scheduled provider appointments Monitor BP and record readings Adhere to a low sodium/DASH diet Engage in low impact activity as tolerated Contact provider office with questions and new concerns as needed      PLAN A member of the care management team will follow up next month.  [name] Care Management [facility] [phone]

## 2021-03-13 NOTE — Patient Instructions (Addendum)
Thank you for allowing the Chronic Care Management team to participate in your care.    Patient Care Plan: Diabetes Type 2 (Adult)     Problem Identified: Disease Progression (Diabetes, Type 2)      Long-Range Goal: Disease Progression Prevented or Minimized   Start Date: 12/11/2020  Expected End Date: 04/10/2021  Priority: High  Note:   Objective:  Lab Results  Component Value Date   HGBA1C 14.9 (H) 06/06/2020    Lab Results  Component Value Date   HGBA1C 10.9 (A) 01/29/2021    Current Barriers:  Chronic Disease Management support and educational needs related to Diabetes self-management.  Case Manager Clinical Goal(s):  Over the next 120 days, patient will demonstrate improved adherence to prescribed treatment plan for diabetes self care/management as evidenced by: taking medications as prescribed, daily monitoring and recording CBG's,  adherence to an ADA/ carb modified diet.   Interventions:  Collaboration with PCP regarding development and update of comprehensive plan of care as evidenced by provider attestation and co-signature Inter-disciplinary care team collaboration (see longitudinal plan of care) Discussed s/sx of hypoglycemia and hyperglycemia along with recommended interventions. Reviewed blood glucose readings. Reports numbers have improved but continues to have high readings in the 250's. She is very eager to achieve better glycemic control but prefers not to use insulin. Reports taking glipizide and metformin as prescribed. Overall A1C has improved. Decreased for 14.9% to 10.9%. Collaborated with the [facility] regarding patient's request to discuss medications for better glycemic control. The Pharmacist will reach out within the next few weeks. Discussed nutritional intake. Reports nutritional intake has improved but still attempting to monitor carb intake. Advised to continue reading nutrition labels and limiting intake of concentrated sugars. Advised to update  the team if she is willing to speak with a dietician. Reviewed recommended diabetic exams. Continues to perform daily foot care. Denies significant changes with vision but notes an increased glare when readings. She is due for an eye exam. Advised to contact Optometry to schedule as soon as possible.   Self-Care/Patient Goals: Self administer medications as prescribed Attend all scheduled provider appointments Monitor blood glucose levels and record readings Engage in low impact activity as tolerated Adhere to prescribed ADA/carb modified Complete outreach with the [facility] Pharmacist Contact Optometry to schedule an updated eye exam Contact clinic with questions and new concerns as needed   Follow Up Plan:  Will follow up in two months    Patient Care Plan: Hypertension (Adult)     Problem Identified: Hypertension (Hypertension)      Long-Range Goal: Hypertension Monitored Completed 03/13/2021  Start Date: 12/11/2020  Expected End Date: 04/10/2021  Priority: High  Note:    Current Barriers:  Chronic Disease Management support and educational needs related to Hypertension and Dyslipidemia.  Case Manager Clinical Goal(s):  Over the next 120 days, patient will demonstrate improved adherence to prescribed treatment plan as evidenced by taking all medications as prescribed, monitoring and recording blood pressure and adhering to a low sodium/DASH diet.  Interventions:  Inter-disciplinary care team collaboration (see longitudinal plan of care) Reviewed compliance with current treatment plan. Advised to continue taking medications as prescribed. Advised to update her  PCP if unable to tolerate regimen. Currently tolerating statin well. Reviewed established BP parameters and encouraged to monitor and record readings. Advised to continue monitoring sodium intake and attempt to avoid highly processed foods when possible. Reviewed s/sx of heart attack, stroke and worsening symptoms that require  immediate medical attention.  Self-Care/Patient Goals: Self administer medications as prescribed Attend all scheduled provider appointments Monitor BP and record readings Adhere to a low sodium/DASH diet Engage in low impact activity as tolerated Contact provider office with questions and new concerns as needed       Mrs. Stratmann verbalized understanding of the information discussed during the telephonic outreach. Declined need for mailed/printed instructions. A member of the care management team will follow up next month.  [name] Care Management [facility] [phone]

## 2021-03-13 NOTE — Telephone Encounter (Signed)
  Care Management   Follow Up Note   03/13/2021 Name: Emily Stokes MRN: [id number] DOB: [birth year]   Primary Care Provider: Gwyneth Sprout, FNP Reason for referral : Chronic Care Management   An unsuccessful telephone outreach was attempted today. The patient was referred to the case management team for assistance with care management and care coordination.     Follow Up Plan:  A HIPAA compliant voice message was left today requesting a return call.    [name] Care Management [facility] [phone]

## 2021-03-22 ENCOUNTER — Telehealth: Payer: Self-pay

## 2021-03-22 NOTE — Progress Notes (Signed)
Chronic Care Management Pharmacy Assistant   Name: Emily Stokes  MRN: [id number] DOB: Oct 04, 1960  Initial visit with Emily Stokes, CPP on 03/27/2021 @ 0915 Initial Visit Assessment  Conditions to be addressed/monitored: HTN, DMII, and Internal Hemorrhoids, Lumbar radiculopathy, Lumbar Degenerative Disc Disease, Lumbar Spondylosis, Obesity, Hypercholesteremia, Facet Syndrome lumbar, Chronic Pain Syndrome, Spinal Stenosis, Lumbar Region, with Neurogenic Claudication  Primary concerns for visit include: Patient stated that she has really high numbers regarding her blood sugar, and she would like to work on getting her number normal   Recent office visits:  01/29/2021 Emily Huh, MD (PCP Office Visit) for Urinary Frequency- Started: Glipizide 2.5 mg daily, Lab order placed, patient instructed to follow-up in 2 months  12/04/2020 Emily Murders, PA-C (PCP Office Visit) for Follow-up- No medication changes noted, POCT Urinalysis dipstick order placed, no follow-up noted.  Recent consult visits:  01/17/2021 Emily Santa, MD (Pain Management Telemedicine) for Follow-up- No medication changes noted, no orders placed, no follow-up noted  12/20/2020 Emily Santa, MD (Pain Management) for Hip Pain- Procedure Office Visit for Therapeutic InterLaminar Epidural Steroid Injection- No medication changes noted, Patient instructed to follow-up in 4 weeks.   12/14/2020 Emily Santa, MD (Pain Management) for Pain- Stopped: Benzonatate 100 mg due to patient not taking, no orders placed, patient instructed to follow-up in 6 days  Hospital visits:  Medication Reconciliation was completed by comparing discharge summary, patient's EMR and Pharmacy list, and upon discussion with patient.  Admitted to the hospital on 10/04/2020 due to Abscess of Right Arm. Discharge date was 10/04/2020. Discharged from [facility] Emergency Department.    New?Medications Started at [facility]  Discharge:?? -Started Doxycycline 100 mg capsule twice a day X 7 days due to Cellulitis of R Arm Keflex 500 mg Capsule 2 capsules twice daily X 7 days   Medication Changes at Hospital Discharge: -Changed None ID  Medications Discontinued at Hospital Discharge: -Stopped None ID  Medications that remain the same after Hospital Discharge:??  -All other medications will remain the same.    Medications: Outpatient Encounter Medications as of 03/22/2021  Medication Sig   acetaminophen (TYLENOL) 500 MG tablet Take 500 mg by mouth every 6 (six) hours as needed.   amLODipine (NORVASC) 10 MG tablet TAKE 1 TABLET BY MOUTH DAILY   aspirin 81 MG tablet Take 81 mg by mouth daily.   atorvastatin (LIPITOR) 20 MG tablet Take 1 tablet (20 mg total) by mouth daily. Please schedule an office visit before anymore refills.   gabapentin (NEURONTIN) 300 MG capsule Take 1 capsule (300 mg total) by mouth 2 (two) times daily.   glipiZIDE (GLUCOTROL XL) 2.5 MG 24 hr tablet TAKE 1 TABLET BY MOUTH EVERY DAY WITH BREAKFAST   glucose blood (ONETOUCH VERIO) test strip USE AS DIRECTED TO TEST BLOOD GLUCOSE BEFORE BREAKFAST. RECHECK IF ANY SYMPTOMS OF HYPOGLYCEMIA   Ibuprofen-diphenhydrAMINE Cit (IBUPROFEN PM PO) Take by mouth at bedtime as needed.   Lancets (ONETOUCH DELICA PLUS [id number]) MISC USE AS DIRECTED TWICE DAILY IN THE MORNING AND AT BEDTIME   metFORMIN (GLUCOPHAGE) 500 MG tablet Take 2 tablets (1,000 mg total) by mouth 2 (two) times daily with a meal.   metoprolol tartrate (LOPRESSOR) 25 MG tablet TAKE ONE (1) TABLET BY MOUTH TWICE DAILY   nitrofurantoin, macrocrystal-monohydrate, (MACROBID) 100 MG capsule Take 1 capsule (100 mg total) by mouth 2 (two) times daily.   omega-3 acid ethyl esters (LOVAZA) 1 g capsule Take 1 capsule (1 g total) by mouth 2 (two) times  daily.   No facility-administered encounter medications on file as of 03/22/2021.   Care Gaps: Zoster Vaccines PNA Vaccine COVID-19 Vaccine  Booster 3 Diabetic Eye Exam Influenza Vaccine A1C > 9  Star Rating Drugs: Glipizide 2.5 mg last filled on 01/29/2021 for a 30-Day supply with [facility] Atorvastatin 20 mg last filled on 01/23/2021 for a 30-Day supply with [facility] Metformin 500 mg last filled on 01/23/2021 for a 30-Day supply with [facility]   Have you seen any other providers since your last visit? No  Any changes in your medications or health?   Yes, patient stated her blood sugars are pretty much always elevated. She reports that recently she started on Glipizide to assist with trying to get her blood sugars under control. Patient stated outside of her blood sugars being high she really has no issues with anything else regarding her health.  Any side effects from any medications?   Patient denies experiencing any side effects from her medications at this time  Do you have an symptoms or problems not managed by your medications?   Yes, patient feels like the medications she is currently taking for her diabetes is not managing her blood sugars  Any concerns about your health right now?   Yes, her blood sugars being elevated, and the long term effects that the elevated numbers can cause.  Has your provider asked that you check blood pressure, blood sugar, or follow special diet at home?   Patient stated that she does check her blood sugars daily. She reports depending on how she feels maybe 2-3 times a day. She stated her blood sugars can run anywhere between 99-279. She stated that her blood sugars are normally on the higher end like this morning it was 279 before breakfast. Patient stated she doesn't follow a specific diet. She does state that she doesn't understand why her blood sugars are always so elevated as she really doesn't eat much, or eat anything during the day. Patient reports having  a very poor appetite. Patient reports that she found out she was a diabetic right after her spouse passed away, and her  appetite has been pretty much nonexistent since that time as well. Patient stated when she does eat she will eat a banana sandwich. Patient reports that she does drink a lot of water during the day and that's pretty much about it.    Do you get any type of exercise on a regular basis? Patient states that she has pain in her left leg that she does see pain management for so she does not do much exercising.   Can you think of a goal you would like to reach for your health? Get her blood sugar number to normal  Do you have any problems getting your medications? Patient denies having issues with getting or affording her medications.  Please bring medications and supplements to appointment  11/11 LVM requesting patient to return my call 11/14 LVM requesting patient to return my call  [name], [facility] Pharmacist Assistant Phone: [phone]

## 2021-03-27 ENCOUNTER — Ambulatory Visit: Payer: Medicare HMO

## 2021-03-27 DIAGNOSIS — E7800 Pure hypercholesterolemia, unspecified: Secondary | ICD-10-CM

## 2021-03-27 DIAGNOSIS — E119 Type 2 diabetes mellitus without complications: Secondary | ICD-10-CM

## 2021-03-27 DIAGNOSIS — I10 Essential (primary) hypertension: Secondary | ICD-10-CM

## 2021-03-27 MED ORDER — METOPROLOL TARTRATE 25 MG PO TABS: 25.0000 mg | ORAL_TABLET | Freq: Two times a day (BID) | ORAL | 1 refills | Status: DC

## 2021-03-27 MED ORDER — TRULICITY 0.75 MG/0.5ML ~~LOC~~ SOAJ: 0.7500 mg | SUBCUTANEOUS | 0 refills | Status: DC

## 2021-03-27 MED ORDER — AMLODIPINE BESYLATE 10 MG PO TABS: 10.0000 mg | ORAL_TABLET | Freq: Every day | ORAL | 1 refills | Status: DC

## 2021-03-27 MED ORDER — ATORVASTATIN CALCIUM 20 MG PO TABS: 20.0000 mg | ORAL_TABLET | Freq: Every day | ORAL | 1 refills | Status: DC

## 2021-03-27 MED ORDER — ASPIRIN 81 MG PO TBEC: 81.0000 mg | DELAYED_RELEASE_TABLET | Freq: Every day | ORAL | 1 refills | Status: DC

## 2021-03-27 MED ORDER — METFORMIN HCL 500 MG PO TABS: 1000.0000 mg | ORAL_TABLET | Freq: Two times a day (BID) | ORAL | 11 refills | Status: DC

## 2021-03-27 NOTE — Progress Notes (Signed)
Chronic Care Management Pharmacy Note  03/27/2021 Name:  Emily Stokes MRN:  [id number] DOB:  November 13, 1960  Summary: Patient presents for initial CCM Consult. She is motivated to get her blood sugar readings down. She has not been taking her glipizide or her Lovaza. She gets adherence packaging through [facility] and was interested in switching pharmacies today.   Recommendations/Changes made from today's visit: -STOP Lovaza (Never started, likely not needed) -STOP Glipizide (ineffective, ran out 2-3 weeks ago)  -START Trulicity 9.16 mg weekly for four weeks, then increase to 1.5 mg weekly. Samples/patient assistance provided.  -[place] for adherence packaging.   -Recheck at PCP follow-up this week or next CPP visit:  A1c Fasting Lipid Panel  Vitamin b12   CBC CMP  Plan: CPP follow-up in 6 weeks   Subjective: Emily Stokes is an 60 y.o. year old female who is a primary patient of Gwyneth Sprout, FNP.  The CCM team was consulted for assistance with disease management and care coordination needs.    Engaged with patient by telephone for initial visit in response to provider referral for pharmacy case management and/or care coordination services.   Consent to Services:  The patient was given the following information about Chronic Care Management services today, agreed to services, and gave verbal consent: 1. CCM service includes personalized support from designated clinical staff supervised by the primary care provider, including individualized plan of care and coordination with other care providers 2. 24/7 contact phone numbers for assistance for urgent and routine care needs. 3. Service will only be billed when office clinical staff spend 20 minutes or more in a month to coordinate care. 4. Only one practitioner may furnish and bill the service in a calendar month. 5.The patient may stop CCM services at any time (effective at the end of the month) by phone call to  the office staff. 6. The patient will be responsible for cost sharing (co-pay) of up to 20% of the service fee (after annual deductible is met). Patient agreed to services and consent obtained.  Patient Care Team: Gwyneth Sprout, FNP as PCP - General (Family Medicine) [place], [place] (Optometry) Gillis Santa, MD as Consulting Physician (Pain Medicine) Neldon Labella, RN as Case Manager Germaine Pomfret, [facility] (Pharmacist)  Recent office visits: 01/29/2021 Lelon Huh, MD (PCP Office Visit) for Urinary Frequency- Started: Glipizide 2.5 mg daily, Lab order placed, patient instructed to follow-up in 2 months   12/04/2020 [name], PA-C (PCP Office Visit) for Follow-up- No medication changes noted, POCT Urinalysis dipstick order placed, no follow-up noted.  Recent consult visits: 01/17/2021 Gillis Santa, MD (Pain Management Telemedicine) for Follow-up- No medication changes noted, no orders placed, no follow-up noted   12/20/2020 Gillis Santa, MD (Pain Management) for Hip Pain- Procedure Office Visit for Therapeutic InterLaminar Epidural Steroid Injection- No medication changes noted, Patient instructed to follow-up in 4 weeks.    12/14/2020 Gillis Santa, MD (Pain Management) for Pain- Stopped: Benzonatate 100 mg due to patient not taking, no orders placed, patient instructed to follow-up in 6 days  Hospital visits: Medication Reconciliation was completed by comparing discharge summary, patient's EMR and Pharmacy list, and upon discussion with patient.   Admitted to the hospital on 10/04/2020 due to Abscess of Right Arm. Discharge date was 10/04/2020. Discharged from [facility] Emergency Department.     New?Medications Started at [facility] Discharge:?? -Started Doxycycline 100 mg capsule twice a day X 7 days due to Cellulitis of R Arm Keflex  500 mg Capsule 2 capsules twice daily X 7 days     Medication Changes at Hospital Discharge: -Changed None ID   Medications  Discontinued at Hospital Discharge: -Stopped None ID   Medications that remain the same after Hospital Discharge:??  -All other medications will remain the same.     Objective:  Lab Results  Component Value Date   CREATININE 1.10 (H) 06/06/2020   BUN 12 06/06/2020   GFRNONAA 55 (L) 06/06/2020   GFRAA 63 06/06/2020   NA 140 06/06/2020   K 4.3 06/06/2020   CALCIUM 9.4 06/06/2020   CO2 19 (L) 06/06/2020   GLUCOSE 183 (H) 06/06/2020    Lab Results  Component Value Date/Time   HGBA1C 10.9 (A) 01/29/2021 09:26 AM   HGBA1C 14.9 (H) 06/06/2020 10:44 AM   HGBA1C 7.9 (H) 07/28/2019 08:25 AM   MICROALBUR 50 06/06/2020 10:48 AM   MICROALBUR 20 01/30/2018 04:54 PM    Last diabetic Eye exam:  Lab Results  Component Value Date/Time   HMDIABEYEEXA No Retinopathy 09/15/2019 12:00 AM    Last diabetic Foot exam: No results found for: HMDIABFOOTEX   Lab Results  Component Value Date   CHOL 163 07/28/2019   HDL 40 07/28/2019   LDLCALC 107 (H) 07/28/2019   TRIG 82 07/28/2019   CHOLHDL 5.3 (H) 04/20/2019    Hepatic Function Latest Ref Rng & Units 06/06/2020 07/28/2019 04/20/2019  Total Protein 6.0 - 8.5 g/dL 7.4 7.4 7.2  Albumin 3.8 - 4.9 g/dL 4.0 4.4 4.1  AST 0 - 40 IU/L $Remov'31 17 14  'cPjknV$ ALT 0 - 32 IU/L $Remov'25 16 15  'cxCSdq$ Alk Phosphatase 44 - 121 IU/L 116 114 95  Total Bilirubin 0.0 - 1.2 mg/dL 0.4 0.4 0.2    Lab Results  Component Value Date/Time   TSH 1.500 07/28/2019 08:25 AM   TSH 1.600 11/06/2018 11:10 AM    CBC Latest Ref Rng & Units 07/28/2019 04/20/2019 11/06/2018  WBC 3.4 - 10.8 x10E3/uL 6.0 5.3 5.7  Hemoglobin 11.1 - 15.9 g/dL 11.7 11.1 11.6  Hematocrit 34.0 - 46.6 % 35.1 35.6 34.3  Platelets 150 - 450 x10E3/uL 374 396 358    No results found for: VD25OH  Clinical ASCVD: No  The 10-year ASCVD risk score (Arnett DK, et al., 2019) is: 19.6%   Values used to calculate the score:     Age: 43 years     Sex: Female     Is Non-Hispanic African American: Yes     Diabetic: Yes      Tobacco smoker: No     Systolic Blood Pressure: 740 mmHg     Is BP treated: Yes     HDL Cholesterol: 40 mg/dL     Total Cholesterol: 163 mg/dL    Depression screen [facility] 2/9 12/11/2020 05/17/2020 05/03/2020  Decreased Interest 0 0 0  Down, Depressed, Hopeless 0 0 0  PHQ - 2 Score 0 0 0  Altered sleeping - - -  Tired, decreased energy - - -  Change in appetite - - -  Feeling bad or failure about yourself  - - -  Trouble concentrating - - -  Moving slowly or fidgety/restless - - -  Suicidal thoughts - - -  PHQ-9 Score - - -  Difficult doing work/chores - - -    Social History   Tobacco Use  Smoking Status Never  Smokeless Tobacco Never   BP Readings from Last 3 Encounters:  01/29/21 138/87  12/20/20 109/74  12/04/20 110/70  Pulse Readings from Last 3 Encounters:  01/29/21 79  12/20/20 72  12/04/20 76   Wt Readings from Last 3 Encounters:  01/29/21 223 lb (101.2 kg)  12/20/20 238 lb (108 kg)  12/04/20 225 lb 6.4 oz (102.2 kg)   BMI Readings from Last 3 Encounters:  01/29/21 32.93 kg/m  12/20/20 35.15 kg/m  12/04/20 33.29 kg/m    Assessment/Interventions: Review of patient past medical history, allergies, medications, health status, including review of consultants reports, laboratory and other test data, was performed as part of comprehensive evaluation and provision of chronic care management services.   SDOH:  (Social Determinants of Health) assessments and interventions performed: Yes SDOH Interventions    Flowsheet Row Most Recent Value  SDOH Interventions   Financial Strain Interventions Other (Comment)  [PAP]      SDOH Screenings   Alcohol Screen: Low Risk    Last Alcohol Screening Score (AUDIT): 0  Depression (PHQ2-9): Low Risk    PHQ-2 Score: 0  Financial Resource Strain: Medium Risk   Difficulty of Paying Living Expenses: Somewhat hard  Food Insecurity: No Food Insecurity   Worried About Programme researcher, broadcasting/film/video in the Last Year: Never true   Ran Out  of Food in the Last Year: Never true  Housing: Low Risk    Last Housing Risk Score: 0  Physical Activity: Inactive   Days of Exercise per Week: 0 days   Minutes of Exercise per Session: 0 min  Social Connections: Moderately Isolated   Frequency of Communication with Friends and Family: More than three times a week   Frequency of Social Gatherings with Friends and Family: More than three times a week   Attends Religious Services: More than 4 times per year   Active Member of [employer] or Organizations: No   Attends Banker Meetings: Never   Marital Status: Widowed  Stress: No Stress Concern Present   Feeling of Stress : Only a little  Tobacco Use: Low Risk    Smoking Tobacco Use: Never   Smokeless Tobacco Use: Never   Passive Exposure: Not on file  Transportation Needs: No Transportation Needs   Lack of Transportation (Medical): No   Lack of Transportation (Non-Medical): No    CCM Care Plan  Allergies  Allergen Reactions   Bactrim [Sulfamethoxazole-Trimethoprim] Swelling    Medications Reviewed Today     Reviewed by Gaspar Cola, [facility] (Pharmacist) on 03/27/21 at 1024  Med List Status: <None>   Medication Order Taking? Sig Documenting Provider Last Dose Status Informant  acetaminophen (TYLENOL) 500 MG tablet [id number] Yes Take 500 mg by mouth every 6 (six) hours as needed. [provider] Taking Active   amLODipine (NORVASC) 10 MG tablet [id number] Yes TAKE 1 TABLET BY MOUTH DAILY Chrismon, Jodell Cipro, PA-C Taking Active   aspirin 81 MG tablet [id number] Yes Take 81 mg by mouth daily. [provider] Taking Active Self  atorvastatin (LIPITOR) 20 MG tablet [id number] Yes Take 1 tablet (20 mg total) by mouth daily. Please schedule an office visit before anymore refills. Jacky Kindle, FNP Taking Active   gabapentin (NEURONTIN) 300 MG capsule [id number] Yes Take 1 capsule (300 mg total) by mouth 2 (two) times daily. Edward Jolly, MD Taking Active    Patient not taking:  Discontinued 03/27/21 1024 (Ineffective)   glucose blood (ONETOUCH VERIO) test strip [id number]  USE AS DIRECTED TO TEST BLOOD GLUCOSE BEFORE BREAKFAST. RECHECK IF ANY SYMPTOMS OF HYPOGLYCEMIA Chrismon, Jodell Cipro, PA-C  Active  Ibuprofen-diphenhydrAMINE Cit (IBUPROFEN PM PO) [id number] Yes Take by mouth at bedtime as needed. [provider] Taking Active   Lancets (ONETOUCH DELICA PLUS [id number]) [place] [id number]  USE AS DIRECTED TWICE DAILY IN THE MORNING AND AT BEDTIME Chrismon, [name], PA-C  Active   metFORMIN (GLUCOPHAGE) 500 MG tablet [id number] Yes Take 2 tablets (1,000 mg total) by mouth 2 (two) times daily with a meal. Chrismon, [name], PA-C Taking Active   metoprolol tartrate (LOPRESSOR) 25 MG tablet [id number] Yes TAKE ONE (1) TABLET BY MOUTH TWICE DAILY Chrismon, [name], PA-C Taking Active   Patient not taking:  Discontinued 03/27/21 1024 (Prescription never filled)   Med List Note Landis Martins, [place] 07/13/20 7915): UDS 10-21-19 MR 09/11/2020 Opiod contracts signed 02/12/18 07-13-20 PA request for Tramadol submitted. Key [id number]  dw            Patient Active Problem List   Diagnosis Date Noted   Spinal stenosis, lumbar region, with neurogenic claudication 04/27/2020   Personal history of colonic polyps    Polyp of transverse colon    Lumbar facet arthropathy 03/18/2018   Lumbar spondylosis 03/18/2018   Encounter for screening colonoscopy    Internal hemorrhoids    Benign neoplasm of descending colon    Benign neoplasm of cecum    Type 2 diabetes mellitus without complication, without long-term current use of insulin ([place]) 07/31/2017   Recurrent abdominal hernia without obstruction or gangrene 07/24/2017   Cellulitis of face 05/30/2016   Chronic pain syndrome 05/30/2016   Nasal septal abscess 05/30/2016   Pain in elbow 02/05/2016   Lumbar degenerative disc disease 09/14/2015   Facet syndrome, lumbar 09/14/2015   Lumbar radiculopathy  09/14/2015   Sacroiliac joint dysfunction 09/14/2015   History of chicken pox 02/02/2015   Leg pain 02/02/2015   Hypercholesteremia 02/02/2015   Blood glucose elevated 02/02/2015   Pain in soft tissues of limb 02/02/2015   Nonspecific ST-T changes 02/02/2015   Awareness of heartbeats 02/02/2015   Pain in extremity at multiple sites 02/02/2015   Snores 02/02/2015   Hernia of anterior abdominal wall 02/24/2012   Pain in shoulder 12/05/2011   Adnexal mass 10/30/2011   Pelvic cyst 10/30/2011   Palpitations 03/14/2011   HTN (hypertension) 03/14/2011   Abnormal finding on EKG 03/14/2011   Obesity 03/14/2011   Infected surgical wound 09/15/2008   Urinary system disease 08/10/2008   Benign essential HTN 07/12/2008   Arthralgia of lower leg 03/28/2008   Anemia, iron deficiency 06/17/2007   Calculus of kidney 05/26/2007    Immunization History  Administered Date(s) Administered   Influenza Split 05/26/2007   Influenza,inj,Quad PF,6+ Mos 05/31/2016, 05/19/2017, 01/30/2018, 02/11/2020   PFIZER(Purple Top)SARS-COV-2 Vaccination 08/05/2019, 08/31/2019   Pneumococcal Polysaccharide-23 01/30/2018    Conditions to be addressed/monitored:  Hypertension, Hyperlipidemia, Diabetes, and Chronic Pain  Care Plan : General Pharmacy (Adult)  Updates made by Germaine Pomfret, RPH since 03/27/2021 12:00 AM     Problem: Hypertension, Hyperlipidemia, Diabetes, and Chronic Pain   Priority: High     Long-Range Goal: Patient-Specific Goal   Start Date: 03/27/2021  Expected End Date: 03/27/2022  This Visit's Progress: On track  Priority: High  Note:   Current Barriers:  Unable to achieve control of diabetes   Pharmacist Clinical Goal(s):  Patient will maintain control of diabetes as evidenced by A1c less than 8%  through collaboration with PharmD and provider.   Interventions: 1:1 collaboration with Gwyneth Sprout, FNP regarding development and update of  comprehensive plan of care as  evidenced by provider attestation and co-signature Inter-disciplinary care team collaboration (see longitudinal plan of care) Comprehensive medication review performed; medication list updated in electronic medical record  Hypertension (BP goal <130/80) -Controlled -Current treatment: Amlodipine 10 mg daily  Metoprolol 25 mg twice daily  -Medications previously tried: NA  -Current home readings: Does not monitor at home -Denies hypotensive/hypertensive symptoms -Questionably appropriate use of beta blocker given lack of clear indication. Will address at future visit -Recommended to continue current medication  Hyperlipidemia: (LDL goal < 70) -Uncontrolled -Current treatment: Atorvastatin 20 mg daily  -Current treatment: Aspirin 81 mg daily  -Medications previously tried: NA  - Patient has multiple bottles of Lovaza, but never started medications due to concerns. No signs of hypertriglyceridemia on last recheck. -STOP Lovaza -Recheck fasting lipid panel, plan to increase atorvastatin if LDL still uncontrolled  Diabetes (A1c goal <8%) -Uncontrolled -Diagnosed 2018 -Current medications: Metformin 500 mg 2 tablets twice daily  -Medications previously tried: NA  -Ran out of Glipizide for 2-3 weeks ago, felt it made no difference in her blood sugars so never refilled medication.  -Current home glucose readings  Fasting  15-Nov 204  14-Nov 274  13-Nov 159  12-Nov 99  -Blood sugars significantly elevated since receiving epidural steroid injection in September.  -Denies hypoglycemic/hyperglycemic symptoms. Does report relative hypoglycemia when her blood sugars were 99. Felt nervous.  -Current meal patterns:  Typically eats one main meal daily. Spaghetti OR banana sandwich. Rarely will have a salad. drinks: Mainly water. 1/2 cup of soda ~1x weekly.  -Current exercise: Unable walk due to leg pain.  STOP Glipizide (ineffective)  START Trulicity 3.01 mg weekly for four weeks, then  increase to 1.5 mg weekly. Patient will be provided with 2 weeks of samples and will plan to start on patient assistance.   Chronic Pain (Goal: Minimize pain symptoms) -Not ideally controlled -Managed by Dr. Holley Raring -Current treatment  Gabapentin 300 mg twice daily  -Medications previously tried: NA  -Recommended to continue current medication  Patient Goals/Self-Care Activities Patient will:  - check glucose daily before breakfast, document, and provide at future appointments  Follow Up Plan: Face to Face appointment with care management team member scheduled for: 05/08/2021 at 1:00 PM      Medication Assistance: Application for Trulicity  medication assistance program. in process.  Anticipated assistance start date TBD.  See plan of care for additional detail.  Compliance/Adherence/Medication fill history: Care Gaps: Zoster Vaccines PNA Vaccine COVID-19 Vaccine Booster 3 Diabetic Eye Exam Influenza Vaccine A1C > 9 (recheck next month)  Star-Rating Drugs: Glipizide 2.5 mg last filled on 01/29/2021 for a 30-Day supply with [facility] Atorvastatin 20 mg last filled on 01/23/2021 for a 30-Day supply with [facility] Metformin 500 mg last filled on 01/23/2021 for a 30-Day supply with [facility]   Patient's preferred pharmacy is:  [facility] - [place], [place] - [address] [place] [address] Phone: [phone] Fax: [phone]  Uses pill box? Utilizes [facility] medication packaging  We discussed: Verbal consent obtained for [facility] enhanced pharmacy services (medication synchronization, adherence packaging, delivery coordination). A medication sync plan was created to allow patient to get all medications delivered once every 30 to 90 days per patient preference. Patient understands they have freedom to choose pharmacy and clinical pharmacist will coordinate care between all prescribers and [facility]. Patient decided to: Utilize [facility] for medication synchronization, packaging and delivery  Care Plan and Follow Up Patient Decision:  Patient agrees to Care Plan and Follow-up.  Plan: Face to Face appointment with care management team member scheduled for: 05/08/2021 at 1:00 PM  Junius Argyle, PharmD, [place], [place] [phone]

## 2021-03-27 NOTE — Patient Instructions (Signed)
Visit Information It was great speaking with you today!  Please let me know if you have any questions about our visit.   Goals Addressed             This Visit's Progress    Monitor and Manage My Blood Sugar-Diabetes Type 2   On track    Timeframe:  Long-Range Goal Priority:  High Start Date: 03/27/2021                            Expected End Date: 03/27/2022                      Follow Up within 30 days   - check blood sugar daily before breakfast - check blood sugar if I feel it is too high or too low - enter blood sugar readings and medication or insulin into daily log - take the blood sugar log to all doctor visits    Why is this important?   Checking your blood sugar at home helps to keep it from getting very high or very low.  Writing the results in a diary or log helps the doctor know how to care for you.  Your blood sugar log should have the time, date and the results.  Also, write down the amount of insulin or other medicine that you take.  Other information, like what you ate, exercise done and how you were feeling, will also be helpful.     Notes:         Patient Care Plan: Diabetes Type 2 (Adult)     Problem Identified: Disease Progression (Diabetes, Type 2)      Long-Range Goal: Disease Progression Prevented or Minimized   Start Date: 12/11/2020  Expected End Date: 04/10/2021  Priority: High  Note:   Objective:  Lab Results  Component Value Date   HGBA1C 14.9 (H) 06/06/2020    Lab Results  Component Value Date   HGBA1C 10.9 (A) 01/29/2021    Current Barriers:  Chronic Disease Management support and educational needs related to Diabetes self-management.  Case Manager Clinical Goal(s):  Over the next 120 days, patient will demonstrate improved adherence to prescribed treatment plan for diabetes self care/management as evidenced by: taking medications as prescribed, daily monitoring and recording CBG's,  adherence to an ADA/ carb modified  diet.   Interventions:  Collaboration with PCP regarding development and update of comprehensive plan of care as evidenced by provider attestation and co-signature Inter-disciplinary care team collaboration (see longitudinal plan of care) Discussed s/sx of hypoglycemia and hyperglycemia along with recommended interventions. Reviewed blood glucose readings. Reports numbers have improved but continues to have high readings in the 250's. She is very eager to achieve better glycemic control but prefers not to use insulin. Reports taking glipizide and metformin as prescribed. Overall A1C has improved. Decreased for 14.9% to 10.9%. Collaborated with the [facility] Pharmacy regarding patient's request to discuss medications for better glycemic control. The Pharmacist will reach out within the next few weeks. Discussed nutritional intake. Reports nutritional intake has improved but still attempting to monitor carb intake. Advised to continue reading nutrition labels and limiting intake of concentrated sugars. Advised to update the team if she is willing to speak with a dietician. Reviewed recommended diabetic exams. Continues to perform daily foot care. Denies significant changes with vision but notes an increased glare when readings. She is due for an eye exam. Advised to contact Optometry  to schedule as soon as possible.   Self-Care/Patient Goals: Self administer medications as prescribed Attend all scheduled provider appointments Monitor blood glucose levels and record readings Engage in low impact activity as tolerated Adhere to prescribed ADA/carb modified Complete outreach with the CCM Pharmacist Contact Optometry to schedule an updated eye exam Contact clinic with questions and new concerns as needed   Follow Up Plan:  Will follow up in two months    Patient Care Plan: Hypertension (Adult)     Problem Identified: Hypertension (Hypertension)      Long-Range Goal: Hypertension Monitored  Completed 03/13/2021  Start Date: 12/11/2020  Expected End Date: 04/10/2021  Priority: High  Note:    Current Barriers:  Chronic Disease Management support and educational needs related to Hypertension and Dyslipidemia.  Case Manager Clinical Goal(s):  Over the next 120 days, patient will demonstrate improved adherence to prescribed treatment plan as evidenced by taking all medications as prescribed, monitoring and recording blood pressure and adhering to a low sodium/DASH diet.  Interventions:  Inter-disciplinary care team collaboration (see longitudinal plan of care) Reviewed compliance with current treatment plan. Advised to continue taking medications as prescribed. Advised to update her  PCP if unable to tolerate regimen. Currently tolerating statin well. Reviewed established BP parameters and encouraged to monitor and record readings. Advised to continue monitoring sodium intake and attempt to avoid highly processed foods when possible. Reviewed s/sx of heart attack, stroke and worsening symptoms that require immediate medical attention.   Self-Care/Patient Goals: Self administer medications as prescribed Attend all scheduled provider appointments Monitor BP and record readings Adhere to a low sodium/DASH diet Engage in low impact activity as tolerated Contact provider office with questions and new concerns as needed     Patient Care Plan: General Pharmacy (Adult)     Problem Identified: Hypertension, Hyperlipidemia, Diabetes, and Chronic Pain   Priority: High     Long-Range Goal: Patient-Specific Goal   Start Date: 03/27/2021  Expected End Date: 03/27/2022  This Visit's Progress: On track  Priority: High  Note:   Current Barriers:  Unable to achieve control of diabetes   Pharmacist Clinical Goal(s):  Patient will maintain control of diabetes as evidenced by A1c less than 8%  through collaboration with PharmD and provider.   Interventions: 1:1 collaboration with  Gwyneth Sprout, FNP regarding development and update of comprehensive plan of care as evidenced by provider attestation and co-signature Inter-disciplinary care team collaboration (see longitudinal plan of care) Comprehensive medication review performed; medication list updated in electronic medical record  Hypertension (BP goal <130/80) -Controlled -Current treatment: Amlodipine 10 mg daily  Metoprolol 25 mg twice daily  -Medications previously tried: NA  -Current home readings: Does not monitor at home -Denies hypotensive/hypertensive symptoms -Questionably appropriate use of beta blocker given lack of clear indication. Will address at future visit -Recommended to continue current medication  Hyperlipidemia: (LDL goal < 70) -Uncontrolled -Current treatment: Atorvastatin 20 mg daily  -Current treatment: Aspirin 81 mg daily  -Medications previously tried: NA  - Patient has multiple bottles of Lovaza, but never started medications due to concerns. No signs of hypertriglyceridemia on last recheck. -STOP Lovaza -Recheck fasting lipid panel, plan to increase atorvastatin if LDL still uncontrolled  Diabetes (A1c goal <8%) -Uncontrolled -Diagnosed 2018 -Current medications: Metformin 500 mg 2 tablets twice daily  -Medications previously tried: NA  -Ran out of Glipizide for 2-3 weeks ago, felt it made no difference in her blood sugars so never refilled medication.  -Current home glucose  readings  Fasting  15-Nov 204  14-Nov 274  13-Nov 159  12-Nov 99  -Blood sugars significantly elevated since receiving epidural steroid injection in September.  -Denies hypoglycemic/hyperglycemic symptoms. Does report relative hypoglycemia when her blood sugars were 99. Felt nervous.  -Current meal patterns:  Typically eats one main meal daily. Spaghetti OR banana sandwich. Rarely will have a salad. drinks: Mainly water. 1/2 cup of soda ~1x weekly.  -Current exercise: Unable walk due to leg pain.   STOP Glipizide (ineffective)  START Trulicity 9.47 mg weekly for four weeks, then increase to 1.5 mg weekly. Patient will be provided with 2 weeks of samples and will plan to start on patient assistance.   Chronic Pain (Goal: Minimize pain symptoms) -Not ideally controlled -Managed by Dr. Holley Raring -Current treatment  Gabapentin 300 mg twice daily  -Medications previously tried: NA  -Recommended to continue current medication  Patient Goals/Self-Care Activities Patient will:  - check glucose daily before breakfast, document, and provide at future appointments  Follow Up Plan: Face to Face appointment with care management team member scheduled for: 05/08/2021 at 1:00 PM    Ms. Vanhecke was given information about Chronic Care Management services today including:  CCM service includes personalized support from designated clinical staff supervised by her physician, including individualized plan of care and coordination with other care providers 24/7 contact phone numbers for assistance for urgent and routine care needs. Standard insurance, coinsurance, copays and deductibles apply for chronic care management only during months in which we provide at least 20 minutes of these services. Most insurances cover these services at 100%, however patients may be responsible for any copay, coinsurance and/or deductible if applicable. This service may help you avoid the need for more expensive face-to-face services. Only one practitioner may furnish and bill the service in a calendar month. The patient may stop CCM services at any time (effective at the end of the month) by phone call to the office staff.  Patient agreed to services and verbal consent obtained.   Verbal consent obtained for [facility] enhanced pharmacy services (medication synchronization, adherence packaging, delivery coordination). A medication sync plan was created to allow patient to get all medications delivered once every 30  to 90 days per patient preference. Patient understands they have freedom to choose pharmacy and clinical pharmacist will coordinate care between all prescribers and [facility].  Patient verbalizes understanding of instructions provided today and agrees to view in [place].   Junius Argyle, PharmD, [place], CPP  Clinical Pharmacist Practitioner  [facility] [phone]

## 2021-03-28 ENCOUNTER — Telehealth: Payer: Self-pay

## 2021-03-28 NOTE — Progress Notes (Signed)
Chronic Care Management Pharmacy Assistant   Name: MARIEN MANSHIP  MRN: [id number] DOB: December 21, 1960  Patient Assistant Application/Onboarding for [facility]  I received a task from Junius Argyle, CPP requesting that I start a patient assistant application for Trulicity 1.5 mg weekly for the patient. I started the patient assistant application for the patient to complete her portion of the application so it can be faxed to [facility] @ [phone] for processing. Patient has an appointment at [facility] on 85/46/2703 so the application will be left at the office so the patient can sign, fill in her financial information, and give it back to Junius Argyle, CPP and he will fax it over to [employer].   Application has been e-mailed to Junius Argyle, CPP for printing to leave at the front desk for the patient to complete.  I also received a request to complete the onboarding process for this patient so she can start receiving her mediations through [employer].  I was advised to contact [facility] to request a profile transfer for the patient, and when I reached out to [facility] the representative informed me that all the patient already had a profile transfer of her medications that had been sent to [facility] on [address]. I informed CPP, and he advised he sent all the refills to [facility] for her Chronic medications so to contact [facility] on Monday to have them transfer her Gabapentin over to [facility].  04/02/2021  I contacted [facility] @ [phone], and I was on hold for 3 minutes when someone answered the phone and hung it up without saying anything. When I called back I received a recording that stated they were at lunch and would be back at 2 pm.  04/03/2021  I contacted [facility] and requested the profile transfer for the patient.  Contacted the patient, per CPP the patient missed her appointment on Friday and did not pick up her samples of  Trulicity or the patient assistant application. Patient did not answer so I had to leave a message informing the patient the samples, and the patient assistance application is at the office waiting at the front desk for her to pick up.   Medications: Outpatient Encounter Medications as of 03/28/2021  Medication Sig   acetaminophen (TYLENOL) 500 MG tablet Take 500 mg by mouth every 6 (six) hours as needed.   amLODipine (NORVASC) 10 MG tablet Take 1 tablet (10 mg total) by mouth daily.   aspirin 81 MG EC tablet Take 1 tablet (81 mg total) by mouth daily. Swallow whole.   atorvastatin (LIPITOR) 20 MG tablet Take 1 tablet (20 mg total) by mouth daily. Please schedule an office visit before anymore refills.   Dulaglutide (TRULICITY) 9.37 JI/9.6VE SOPN Inject 0.75 mg into the skin once a week.   gabapentin (NEURONTIN) 300 MG capsule Take 1 capsule (300 mg total) by mouth 2 (two) times daily.   glucose blood (ONETOUCH VERIO) test strip USE AS DIRECTED TO TEST BLOOD GLUCOSE BEFORE BREAKFAST. RECHECK IF ANY SYMPTOMS OF HYPOGLYCEMIA   Ibuprofen-diphenhydrAMINE Cit (IBUPROFEN PM PO) Take by mouth at bedtime as needed.   Lancets (ONETOUCH DELICA PLUS [id number]) MISC USE AS DIRECTED TWICE DAILY IN THE MORNING AND AT BEDTIME   metFORMIN (GLUCOPHAGE) 500 MG tablet Take 2 tablets (1,000 mg total) by mouth 2 (two) times daily with a meal.   metoprolol tartrate (LOPRESSOR) 25 MG tablet Take 1 tablet (25 mg total) by mouth 2 (two) times daily.  No facility-administered encounter medications on file as of 03/28/2021.    [name], CPA/CMA Clinical Pharmacist Assistant Phone: [phone]

## 2021-03-30 ENCOUNTER — Ambulatory Visit: Payer: Medicare HMO | Admitting: Family Medicine

## 2021-03-30 NOTE — Progress Notes (Deleted)
Established patient visit   Patient: Emily Stokes   DOB: 06-Aug-1960   60 y.o. Female  MRN: [id number] Visit Date: 03/30/2021  Today's healthcare provider: Gwyneth Sprout, FNP   No chief complaint on file.  Subjective    HPI  Diabetes Mellitus Type II, Follow-up  Lab Results  Component Value Date   HGBA1C 10.9 (A) 01/29/2021   HGBA1C 14.9 (H) 06/06/2020   HGBA1C 7.9 (H) 07/28/2019   Wt Readings from Last 3 Encounters:  01/29/21 223 lb (101.2 kg)  12/20/20 238 lb (108 kg)  12/04/20 225 lb 6.4 oz (102.2 kg)   Last seen for diabetes 2 months ago.  Management since then includes Will add- glipiZIDE (GLIPIZIDE XL) 2.5 MG 24 hr tablet. She reports {excellent/good/fair/poor:19665} compliance with treatment. She {is/is not:21021397} having side effects. {document side effects if present:1} Symptoms: {Yes/No:20286} fatigue {Yes/No:20286} foot ulcerations  {Yes/No:20286} appetite changes {Yes/No:20286} nausea  {Yes/No:20286} paresthesia of the feet  {Yes/No:20286} polydipsia  {Yes/No:20286} polyuria {Yes/No:20286} visual disturbances   {Yes/No:20286} vomiting     Home blood sugar records: {diabetes glucometry results:16657}  Episodes of hypoglycemia? {Yes/No:20286} {enter symptoms and frequency of symptoms if yes:1}   Current insulin regiment: {enter 'none' or type of insulin and number of units taken with each dose of each insulin formulation that the patient is taking:1} Most Recent Eye Exam: *** {Current exercise:16438:::1} {Current diet habits:16563:::1}  Pertinent Labs: Lab Results  Component Value Date   CHOL 163 07/28/2019   HDL 40 07/28/2019   LDLCALC 107 (H) 07/28/2019   TRIG 82 07/28/2019   CHOLHDL 5.3 (H) 04/20/2019   Lab Results  Component Value Date   NA 140 06/06/2020   K 4.3 06/06/2020   CREATININE 1.10 (H) 06/06/2020   GFRNONAA 55 (L) 06/06/2020   MICROALBUR 50 06/06/2020      ---------------------------------------------------------------------------------------------------   {Link to patient history deactivated due to formatting error:1}  Medications: Outpatient Medications Prior to Visit  Medication Sig   acetaminophen (TYLENOL) 500 MG tablet Take 500 mg by mouth every 6 (six) hours as needed.   amLODipine (NORVASC) 10 MG tablet Take 1 tablet (10 mg total) by mouth daily.   aspirin 81 MG EC tablet Take 1 tablet (81 mg total) by mouth daily. Swallow whole.   atorvastatin (LIPITOR) 20 MG tablet Take 1 tablet (20 mg total) by mouth daily. Please schedule an office visit before anymore refills.   Dulaglutide (TRULICITY) 5.97 CB/6.3AG SOPN Inject 0.75 mg into the skin once a week.   gabapentin (NEURONTIN) 300 MG capsule Take 1 capsule (300 mg total) by mouth 2 (two) times daily.   glucose blood (ONETOUCH VERIO) test strip USE AS DIRECTED TO TEST BLOOD GLUCOSE BEFORE BREAKFAST. RECHECK IF ANY SYMPTOMS OF HYPOGLYCEMIA   Ibuprofen-diphenhydrAMINE Cit (IBUPROFEN PM PO) Take by mouth at bedtime as needed.   Lancets (ONETOUCH DELICA PLUS [id number]) MISC USE AS DIRECTED TWICE DAILY IN THE MORNING AND AT BEDTIME   metFORMIN (GLUCOPHAGE) 500 MG tablet Take 2 tablets (1,000 mg total) by mouth 2 (two) times daily with a meal.   metoprolol tartrate (LOPRESSOR) 25 MG tablet Take 1 tablet (25 mg total) by mouth 2 (two) times daily.   No facility-administered medications prior to visit.    Review of Systems  {Labs  Heme  Chem  Endocrine  Serology  Results Review (optional):23779}   Objective    There were no vitals taken for this visit. {Show previous vital signs (optional):23777}  Physical Exam  ***  No results found for any visits on 03/30/21.  Assessment & Plan     ***  No follow-ups on file.      {provider attestation***:1}   Gwyneth Sprout, [place] [phone] (phone) [phone] (fax)  [place]

## 2021-04-11 DIAGNOSIS — E7800 Pure hypercholesterolemia, unspecified: Secondary | ICD-10-CM | POA: Diagnosis not present

## 2021-04-11 DIAGNOSIS — E119 Type 2 diabetes mellitus without complications: Secondary | ICD-10-CM

## 2021-04-11 DIAGNOSIS — I10 Essential (primary) hypertension: Secondary | ICD-10-CM | POA: Diagnosis not present

## 2021-04-17 ENCOUNTER — Ambulatory Visit: Payer: Self-pay

## 2021-04-17 NOTE — Telephone Encounter (Signed)
Patient called, left VM to return the call to the office to discuss symptoms with a nurse.    Summary: UTI   Pt is having UTI infections and states can not even count how many in the last few months, this one ongoing a week  or more Fu [phone] frequency, feeling urgent and can not hold, has been on antibodicsFU

## 2021-04-17 NOTE — Telephone Encounter (Signed)
Pt called back, states she thinks she has another UTI. She reports having frequency, urgency and unable to hold urine. She says this has been going on for over a week now maybe 2. Denies having any burning or pain when urinating. States she thinks she had a UTI back in October and isnt sure if the abx treated it or not. She is concerned for having frequent UTIs. Attempted to schedule pt but first available was 04/23/21, called and spoke to [place], [facility] who was able to schedule pt for 04/19/21 at 0800 with [place], [place]. Care advice given and pt verbalized understanding. No other questions/concerns noted.   Summary: UTI   Pt is having UTI infections and states can not even count how many in the last few months, this one ongoing a week  or more Fu [phone] frequency, feeling urgent and can not hold, has been on antibodicsFU      Reason for Disposition  Urinating more frequently than usual (i.e., frequency)  Answer Assessment - Initial Assessment Questions 1. SYMPTOM: "What's the main symptom you're concerned about?" (e.g., frequency, incontinence)     Frequency, unable to hold urine, urgency 2. ONSET: "When did the  symptoms  start?"     One or two weeks 3. PAIN: "Is there any pain?" If Yes, ask: "How bad is it?" (Scale: 1-10; mild, moderate, severe)     No 4. CAUSE: "What do you think is causing the symptoms?"     UTI 5. OTHER SYMPTOMS: "Do you have any other symptoms?" (e.g., fever, flank pain, blood in urine, pain with urination)     No 6. PREGNANCY: "Is there any chance you are pregnant?" "When was your last menstrual period?"     No  Protocols used: Urinary Symptoms-A-AH

## 2021-04-19 ENCOUNTER — Ambulatory Visit (INDEPENDENT_AMBULATORY_CARE_PROVIDER_SITE_OTHER): Payer: Medicare Other | Admitting: Physician Assistant

## 2021-04-19 ENCOUNTER — Encounter: Payer: Self-pay | Admitting: Physician Assistant

## 2021-04-19 ENCOUNTER — Other Ambulatory Visit: Payer: Self-pay

## 2021-04-19 VITALS — BP 122/79 | HR 82 | Temp 98.5°F | Resp 16 | Wt 224.0 lb

## 2021-04-19 DIAGNOSIS — M47816 Spondylosis without myelopathy or radiculopathy, lumbar region: Secondary | ICD-10-CM

## 2021-04-19 DIAGNOSIS — N3000 Acute cystitis without hematuria: Secondary | ICD-10-CM | POA: Insufficient documentation

## 2021-04-19 DIAGNOSIS — G894 Chronic pain syndrome: Secondary | ICD-10-CM

## 2021-04-19 DIAGNOSIS — M5416 Radiculopathy, lumbar region: Secondary | ICD-10-CM | POA: Diagnosis not present

## 2021-04-19 HISTORY — DX: Acute cystitis without hematuria: N30.00

## 2021-04-19 LAB — POCT URINALYSIS DIPSTICK
Bilirubin, UA: NEGATIVE
Blood, UA: POSITIVE
Glucose, UA: NEGATIVE
Ketones, UA: NEGATIVE
Nitrite, UA: POSITIVE
Protein, UA: NEGATIVE
Spec Grav, UA: 1.01 (ref 1.010–1.025)
Urobilinogen, UA: 0.2 E.U./dL
pH, UA: 6 (ref 5.0–8.0)

## 2021-04-19 MED ORDER — GABAPENTIN 300 MG PO CAPS: 300.0000 mg | ORAL_CAPSULE | Freq: Two times a day (BID) | ORAL | 5 refills | Status: DC

## 2021-04-19 MED ORDER — NITROFURANTOIN MONOHYD MACRO 100 MG PO CAPS: 100.0000 mg | ORAL_CAPSULE | Freq: Two times a day (BID) | ORAL | 0 refills | Status: AC

## 2021-04-19 NOTE — Assessment & Plan Note (Signed)
Presents today with increased frequency, urgency and intermittent incontinence ongoing since Sept for which she was previously treated with Cipro and Nitrofurantoin.  Urinalysis performed today with culture for sensitivities.  Previous eGFR does not indicate need for dosage adjustment.  Will prescribe Nitrofurantoin 110m BID for 7 days and request pt return for TOC due to prolonged symptoms. Follow up in 2weeks requested for TOC.

## 2021-04-19 NOTE — Progress Notes (Addendum)
Established patient visit   Patient: Emily Stokes   DOB: 1960-06-17   60 y.o. Female  MRN: [id number] Visit Date: 04/19/2021  Today's healthcare provider: [name] [place], PA-C   Introduced myself to the patient as a Journalist, newspaper and provided education on APPs in clinical practice.    Chief Complaint  Patient presents with   Urinary Frequency   Subjective    Urinary Frequency  This is a recurrent problem. The current episode started more than 1 month ago (3 months). The problem has been unchanged. The quality of the pain is described as aching. The pain is at a severity of 3/10. The pain is mild. Associated symptoms include frequency, hesitancy and urgency. Pertinent negatives include no chills, discharge, flank pain, hematuria, nausea, possible pregnancy, sweats or vomiting. Treatments tried: AZO.    States she has been having UTI since Sept.and tx was supposed to be for 2 weeks which she finished but states it did not clear fully.  States she is not sexually active and denies new abnormal joint pains.  Cites urinary urgency, frequency, incontinence.  Denies dysuria, hematuria Admits to bilateral flank pain.  Previous notes indicate a urine culture performed in Sep as well as tx with Cipro and Nitrofurantoin due to culture results.   Patient had a UTI back in September of this year. Patient states the UTI has lingered. Patient has symptoms of low back pain, frequency, hesitancy, and urgency. Patient has been treating symptoms with over-the-counter AZO with mild relief.    Medications: Outpatient Medications Prior to Visit  Medication Sig   acetaminophen (TYLENOL) 500 MG tablet Take 500 mg by mouth every 6 (six) hours as needed.   amLODipine (NORVASC) 10 MG tablet Take 1 tablet (10 mg total) by mouth daily.   aspirin 81 MG EC tablet Take 1 tablet (81 mg total) by mouth daily. Swallow whole.   atorvastatin (LIPITOR) 20 MG tablet Take 1 tablet (20 mg total) by mouth daily. Please  schedule an office visit before anymore refills.   Blood Glucose Monitoring Suppl (ACCU-CHEK GUIDE ME) w/Device KIT    Dulaglutide (TRULICITY) 2.72 ZD/6.6YQ SOPN Inject 0.75 mg into the skin once a week.   Ibuprofen-diphenhydrAMINE Cit (IBUPROFEN PM PO) Take by mouth at bedtime as needed.   Lancet Devices (EASY MINI EJECT LANCING DEVICE) MISC    metFORMIN (GLUCOPHAGE) 500 MG tablet Take 2 tablets (1,000 mg total) by mouth 2 (two) times daily with a meal.   metoprolol tartrate (LOPRESSOR) 25 MG tablet Take 1 tablet (25 mg total) by mouth 2 (two) times daily.   [DISCONTINUED] gabapentin (NEURONTIN) 300 MG capsule Take 1 capsule (300 mg total) by mouth 2 (two) times daily.   Lancets (ONETOUCH DELICA PLUS [id number]) MISC USE AS DIRECTED TWICE DAILY IN THE MORNING AND AT BEDTIME (Patient not taking: Reported on 04/19/2021)   omega-3 acid ethyl esters (LOVAZA) 1 g capsule Take 1 capsule by mouth 2 (two) times daily. (Patient not taking: Reported on 04/19/2021)   [DISCONTINUED] glucose blood (ONETOUCH VERIO) test strip USE AS DIRECTED TO TEST BLOOD GLUCOSE BEFORE BREAKFAST. RECHECK IF ANY SYMPTOMS OF HYPOGLYCEMIA   No facility-administered medications prior to visit.    Review of Systems  Constitutional:  Negative for appetite change, chills, fatigue and fever.  Respiratory:  Negative for chest tightness and shortness of breath.   Cardiovascular:  Negative for chest pain and palpitations.  Gastrointestinal:  Negative for abdominal pain, nausea and vomiting.  Genitourinary:  Positive  for frequency, hesitancy and urgency. Negative for flank pain and hematuria.  Neurological:  Negative for dizziness and weakness.      Objective    BP 122/79 (BP Location: Left Arm, Patient Position: Sitting, Cuff Size: Large)   Pulse 82   Temp 98.5 F (36.9 C) (Temporal)   Resp 16   Wt 224 lb (101.6 kg)   SpO2 98%   BMI 33.08 kg/m  {Show previous vital signs (optional):23777}  Physical Exam Constitutional:       Appearance: Normal appearance. She is not ill-appearing.  HENT:     Head: Normocephalic.  Cardiovascular:     Rate and Rhythm: Normal rate and regular rhythm.     Pulses: Normal pulses.  Abdominal:     General: Abdomen is protuberant. Bowel sounds are normal.     Palpations: Abdomen is soft.     Tenderness: There is no abdominal tenderness.     Comments: Negative for suprapubic, bilateral CVA or flank pain tenderness   Neurological:     Mental Status: She is alert.  Psychiatric:        Mood and Affect: Mood normal.        Behavior: Behavior normal.          Problem List Items Addressed This Visit       Nervous and Auditory   Lumbar radiculopathy   Relevant Medications   gabapentin (NEURONTIN) 300 MG capsule     Musculoskeletal and Integument   Lumbar spondylosis   Relevant Medications   gabapentin (NEURONTIN) 300 MG capsule     Genitourinary   Acute cystitis without hematuria - Primary    Presents today with increased frequency, urgency and intermittent incontinence ongoing since Sept for which she was previously treated with Cipro and Nitrofurantoin.  Urinalysis performed today with culture for sensitivities.  Previous eGFR does not indicate need for dosage adjustment.  Will prescribe Nitrofurantoin 167m BID for 7 days and request pt return for TOC due to prolonged symptoms. Follow up in 2weeks requested for TOC.       Relevant Medications   nitrofurantoin, macrocrystal-monohydrate, (MACROBID) 100 MG capsule   Other Relevant Orders   POCT Urinalysis Dipstick (Completed)   CULTURE, URINE COMPREHENSIVE     Other   Chronic pain syndrome   Relevant Medications   gabapentin (NEURONTIN) 300 MG capsule     I, Tela Kotecki E Nayely Dingus, PA-C, have reviewed all documentation for this visit. The documentation on 04/19/21 for the exam, diagnosis, procedures, and orders are all accurate and complete.        EAlmon Register PA-C  [phone] (phone) [phone](fax)  [place]

## 2021-04-23 ENCOUNTER — Telehealth: Payer: Self-pay

## 2021-04-23 NOTE — Telephone Encounter (Signed)
Called patient for pre virtual; appointment questions.  Unable to leave message due to  mailbox being full.

## 2021-04-24 ENCOUNTER — Encounter: Payer: Self-pay | Admitting: Student in an Organized Health Care Education/Training Program

## 2021-04-24 ENCOUNTER — Ambulatory Visit
Payer: Medicare Other | Attending: Student in an Organized Health Care Education/Training Program | Admitting: Student in an Organized Health Care Education/Training Program

## 2021-04-24 ENCOUNTER — Other Ambulatory Visit: Payer: Self-pay

## 2021-04-24 DIAGNOSIS — M5416 Radiculopathy, lumbar region: Secondary | ICD-10-CM | POA: Diagnosis not present

## 2021-04-24 DIAGNOSIS — M48062 Spinal stenosis, lumbar region with neurogenic claudication: Secondary | ICD-10-CM | POA: Diagnosis not present

## 2021-04-24 DIAGNOSIS — G894 Chronic pain syndrome: Secondary | ICD-10-CM | POA: Diagnosis not present

## 2021-04-24 NOTE — Progress Notes (Signed)
Called [facility] and they confirmed they have the urine but results are pending.

## 2021-04-24 NOTE — Progress Notes (Signed)
Patient: Emily Stokes  Service Category: E/M  Provider: Gillis Santa, MD  DOB: 1961/05/11  DOS: 04/24/2021  Location: Office  MRN: [id number]  Setting: Ambulatory outpatient  Referring Provider: Gwyneth Sprout, Emily Stokes  Type: Established Patient  Specialty: Interventional Pain Management  PCP: Emily Sprout, Emily Stokes  Location: Remote location  Delivery: TeleHealth     Virtual Encounter - Pain Management PROVIDER NOTE: Information contained herein reflects review and annotations entered in association with encounter. Interpretation of such information and data should be left to medically-trained personnel. Information provided to patient can be located elsewhere in the medical record under "Patient Instructions". Document created using STT-dictation technology, any transcriptional errors that may result from process are unintentional.    Contact & Pharmacy Preferred: [phone] Home: [phone] (home) Mobile: [phone] (mobile) E-mail: [email or web address]  [facility] - [place], [place] - [address] [place] [address] Phone: [phone] Fax: [phone]  [id number] [id number]: [phone]: [phone]  Pre-screening  Emily Stokes "in-person" vs "virtual" encounter. She indicated preferring virtual for this encounter.   Reason COVID-19*   Social distancing based on CDC and AMA recommendations.   I contacted Emily Beckwithon 04/24/2021 via telephone.      I clearly identified myself as BGillis Santa MD. I verified that I was speaking with the correct person using two identifiers (Name: Emily Stokes: [id number].  Consent I sought verbal advanced consent from Emily Stokes. I informed Emily Stokes possible security and privacy concerns, risks, and limitations associated with  providing "not-in-person" medical evaluation and management services. I also informed Emily Stokes the availability of "in-person" appointments. Finally, I informed her that there would be a charge for the virtual visit and that she could be  personally, fully or partially, financially responsible for it. Emily Stokes understanding and agreed to proceed.   Historic Elements   Ms. DKAMYRAH FEESERis a 60y.o. year old, female patient evaluated today after our last contact on 12/20/2020. Emily Stokes has a past medical history of Abdominal pain, epigastric, Anemia, Backache, Bronchitis, Calculus, kidney, Carpal tunnel syndrome, Chest pain, Chicken pox, Circulatory disease, Diabetes mellitus without complication (HTalty, Disorder of kidney and ureter, Excess, menstruation, Hypercholesteremia, Hypertension, essential, benign, Infected postoperative seroma, Iron deficiency, Malaise and fatigue, Measles, MRSA (methicillin resistant staph aureus) culture positive, Nausea alone, Nonspecific abnormal electrocardiogram (ECG) (EKG), and Pain in joint, lower leg. She also  has a past surgical history that includes Colonoscopy (1997); Partial hysterectomy (2009); Cesarean section; Lithotripsy (1997); mrsa; Hernia repair (2012); Abdominal hysterectomy; Hartmann's procedure.; Colostomy takedown (2012); Colonoscopy with propofol (N/A, 01/09/2018); and Colonoscopy with propofol (N/A, 08/20/2019). Ms. CDousehas a current medication list which includes the following prescription(s): acetaminophen, amlodipine, aspirin, atorvastatin, accu-chek guide me, dulaglutide, trulicity, gabapentin, ibuprofen-diphenhydramine cit, easy mini eject lancing device, onetouch delica plus [id number] metformin, metoprolol tartrate, nitrofurantoin (macrocrystal-monohydrate), and omega-3 acid ethyl esters. She  reports that she has never smoked. She has never used smokeless tobacco. She reports that she does not drink alcohol and does not use drugs.  Ms. CKirtleyis allergic to bactrim [sulfamethoxazole-trimethoprim].   HPI  Today, she is being contacted for a post-procedure assessment.   Post-Procedure Evaluation  Procedure(s):   Procedure (12/20/2020):  Type: Therapeutic Inter-Laminar Epidural Steroid Injection  #2 for 2022  Region:  Lumbar Level: L3-4 Level. Laterality: Left   Anxiolysis: Please see nurses note.  Effectiveness during initial hour after procedure (Ultra-Short Term Relief): 100 %   Local anesthetic used: Long-acting (4-6 hours) Effectiveness: Defined as any analgesic benefit obtained secondary to the administration of local anesthetics. This carries significant diagnostic value as to the etiological location, or anatomical origin, of the pain. Duration of benefit is expected to coincide with the duration of the local anesthetic used.  Effectiveness during initial 4-6 hours after procedure (Short-Term Relief): 100 %   Long-term benefit: Defined as any relief past the pharmacologic duration of the local anesthetics.  Effectiveness past the initial 6 hours after procedure (Long-Term Relief): 100 %   Benefits, current: Defined as benefit present at the time of this evaluation.   Analgesia:  <20% Function: Emily Stokes reports improvement in function                           Increased blood sugars after previous lumbar epidural steroid injection.  Laboratory Chemistry Profile   Renal Lab Results  Component Value Date   BUN 12 06/06/2020   CREATININE 1.10 (H) 06/06/2020   BCR 11 (L) 06/06/2020   GFRAA 63 06/06/2020   GFRNONAA 55 (L) 06/06/2020    Hepatic Lab Results  Component Value Date   AST 31 06/06/2020   ALT 25 06/06/2020   ALBUMIN 4.0 06/06/2020   ALKPHOS 116 06/06/2020   LIPASE 26 07/07/2017    Electrolytes Lab Results  Component Value Date   NA 140 06/06/2020   K 4.3 06/06/2020   CL 104 06/06/2020   CALCIUM 9.4 06/06/2020    Bone No results found for: VD25OH, VD125OH2TOT, HR4163AG5,  XM4680HO1, 25OHVITD1, 25OHVITD2, 25OHVITD3, TESTOFREE, TESTOSTERONE  Inflammation (CRP: Acute Phase) (ESR: Chronic Phase) No results found for: CRP, ESRSEDRATE, LATICACIDVEN       Note: Above Lab results reviewed.  The primary encounter diagnosis was Lumbar radiculopathy (left L3/4). Diagnoses of Spinal stenosis, lumbar region, with neurogenic claudication and Chronic pain syndrome were also pertinent to this visit.  Plan of Care   Recommend increasing gabapentin to 300 mg, 3 times a day Follow-up as needed for lumbar epidural steroid injection for lumbar radicular pain   Follow-up plan:   Return if symptoms worsen or fail to improve.     Left L3-L4 ESI 05/17/2020, 12/20/20    Recent Visits No visits were found meeting these conditions. Showing recent visits within past 90 days and meeting all other requirements Today's Visits Date Type Provider Dept  04/24/21 Office Visit Emily Santa, MD [facility]  Showing today's visits and meeting all other requirements Future Appointments No visits were found meeting these conditions. Showing future appointments within next 90 days and meeting all other requirements I discussed the assessment and treatment plan with the patient. The patient was provided an opportunity to ask questions and all were answered. The patient agreed with the plan and demonstrated an understanding of the instructions.  Patient advised to call back or seek an in-person evaluation if the symptoms or condition worsens.  Duration of encounter: 30mnutes.  Note by: BGillis Santa MD Date: 04/24/2021; Time: 3:17 PM

## 2021-04-25 LAB — CULTURE, URINE COMPREHENSIVE

## 2021-04-25 LAB — SPECIMEN STATUS REPORT

## 2021-04-26 ENCOUNTER — Other Ambulatory Visit: Payer: Self-pay | Admitting: Physician Assistant

## 2021-04-26 DIAGNOSIS — N3000 Acute cystitis without hematuria: Secondary | ICD-10-CM

## 2021-04-26 MED ORDER — AMOXICILLIN-POT CLAVULANATE 500-125 MG PO TABS
1.0000 | ORAL_TABLET | Freq: Two times a day (BID) | ORAL | 0 refills | Status: AC
Start: 2021-04-26 — End: 2021-05-03

## 2021-05-01 ENCOUNTER — Other Ambulatory Visit: Payer: Self-pay | Admitting: Student in an Organized Health Care Education/Training Program

## 2021-05-01 ENCOUNTER — Telehealth: Payer: Self-pay

## 2021-05-01 DIAGNOSIS — M5416 Radiculopathy, lumbar region: Secondary | ICD-10-CM

## 2021-05-01 DIAGNOSIS — M47816 Spondylosis without myelopathy or radiculopathy, lumbar region: Secondary | ICD-10-CM

## 2021-05-01 DIAGNOSIS — G894 Chronic pain syndrome: Secondary | ICD-10-CM

## 2021-05-01 MED ORDER — GABAPENTIN 300 MG PO CAPS: 300.0000 mg | ORAL_CAPSULE | Freq: Three times a day (TID) | ORAL | 2 refills | Status: DC

## 2021-05-01 NOTE — Progress Notes (Addendum)
Chronic Care Management Pharmacy Assistant   Name: AJAI TERHAAR  MRN: [id number] DOB: 08-Jul-1960  Reason for Encounter: Medication Review/Medication Coordination Call   Recent office visits:  04/19/2021 Talitha Givens, PA-C (PCP Office Visit) for Urinary Frequency- Started: Nitrofurantoin Monohyd Macro 100 mg twice daily, lab orders placed, patient instructed to follow-up in 2 weeks  Recent consult visits:  04/24/2021 Gillis Santa, MD (Pain Management) for Lumbar Radiculopathy- No medication changes noted, no orders placed, no follow-up noted  Hospital visits:  None in previous 6 months  Medications: Outpatient Encounter Medications as of 05/01/2021  Medication Sig   acetaminophen (TYLENOL) 500 MG tablet Take 500 mg by mouth every 6 (six) hours as needed.   amLODipine (NORVASC) 10 MG tablet Take 1 tablet (10 mg total) by mouth daily.   amoxicillin-clavulanate (AUGMENTIN) 500-125 MG tablet Take 1 tablet (500 mg total) by mouth in the morning and at bedtime for 7 days.   aspirin 81 MG EC tablet Take 1 tablet (81 mg total) by mouth daily. Swallow whole.   atorvastatin (LIPITOR) 20 MG tablet Take 1 tablet (20 mg total) by mouth daily. Please schedule an office visit before anymore refills.   Blood Glucose Monitoring Suppl (ACCU-CHEK GUIDE ME) w/Device KIT    Dulaglutide (TRULICITY [place]) Inject into the skin once a week.   Dulaglutide (TRULICITY) 6.55 VZ/4.8OL SOPN Inject 0.75 mg into the skin once a week.   gabapentin (NEURONTIN) 300 MG capsule Take 1 capsule (300 mg total) by mouth 2 (two) times daily.   Ibuprofen-diphenhydrAMINE Cit (IBUPROFEN PM PO) Take by mouth at bedtime as needed.   Lancet Devices (EASY MINI EJECT LANCING DEVICE) MISC    Lancets (ONETOUCH DELICA PLUS [id number]) MISC USE AS DIRECTED TWICE DAILY IN THE MORNING AND AT BEDTIME   metFORMIN (GLUCOPHAGE) 500 MG tablet Take 2 tablets (1,000 mg total) by mouth 2 (two) times daily with a meal.   metoprolol tartrate  (LOPRESSOR) 25 MG tablet Take 1 tablet (25 mg total) by mouth 2 (two) times daily.   omega-3 acid ethyl esters (LOVAZA) 1 g capsule Take 1 capsule by mouth 2 (two) times daily.   No facility-administered encounter medications on file as of 05/01/2021.   Care Gaps: Zoster Vaccines PNA Vaccine Diabetic Eye Exam Influenza Vaccine Urine Microalbumin A1C > 9  Star Rating Drugs: Atorvastatin 20 mg last filled on 04/04/2021 for a 30-Day supply with [facility] Metformin 500 mg  last filled on 04/04/2021 for a 30-Day supply with [facility] Trulicity 2.01 mg  last filled on 04/04/2021 for a 30-Day supply with [facility]  Reviewed chart for medication changes ahead of medication coordination call.  No OVs, Consults, or hospital visits since last care coordination call/Pharmacist visit. (If appropriate, list visit date, provider name)  No medication changes indicated OR if recent visit, treatment plan here.  BP Readings from Last 3 Encounters:  04/19/21 122/79  01/29/21 138/87  12/20/20 109/74    Lab Results  Component Value Date   HGBA1C 10.9 (A) 01/29/2021     Patient obtains medications through Adherence Packaging  30 Days   Last adherence delivery included:  This is the patient's first adherence delivery  Patient declined medications last month: This is the first Medication Coordination Call  Patient is due for next adherence delivery on: 05/11/2021 (Friday) 2nd Route.  Called patient and reviewed medications and coordinated delivery.  This delivery to include:  Amlodipine 10 mg 1 tablet daily (Breakfast) Metformin 500 mg 2 tablets  twice daily (Breakfast, Bedtime) Aspirin 81 mg 1 tablet daily (Breakfast) Metoprolol Tartrate 25 mg 1 tablet twice daily (Breakfast, Bedtime) Atorvastatin 20 mg 1 tablet daily (Breakfast) Trulicity Inject 1.74 mg into the skin once a week  Patient declined the following medications: Gabapentin 300 mg 1 capsule twice  daily- [facility] but she stated her pain management provider increased the medication from twice daily to three times daily. I contacted Dr. Elwyn Lade Office and requested that the new prescription be sent to [facility]. I also reached out to [facility], and had the transfer her current Gabapentin prescription over to [facility].   Patient needs refills for Trulicity 0.81 mg will see if Leata Mouse, CPP can send the refill to [facility]  Confirmed delivery date of 05/11/2021 2nd Route, advised patient that pharmacy will contact them the morning of delivery.  Patient has scheduled telephone appointment with Junius Argyle, CPP on 05/08/2021 @ 1300.  [name], CPA/CMA Clinical Pharmacist Assistant Phone: [phone]   10 minutes spent in review, coordination, and documentation.  Reviewed by: Beverly Milch, PharmD Clinical Pharmacist [phone]

## 2021-05-02 ENCOUNTER — Other Ambulatory Visit: Payer: Self-pay | Admitting: Family Medicine

## 2021-05-02 DIAGNOSIS — I10 Essential (primary) hypertension: Secondary | ICD-10-CM

## 2021-05-02 NOTE — Telephone Encounter (Signed)
Requested Prescriptions  Pending Prescriptions Disp Refills   TRULICITY 2.24 SL/7.5PY SOPN [Pharmacy Med Name: Trulicity 0.51 TM/2.1 mL subcutaneous pen injector] 2 mL 0    Sig: INJECT 0.75 MG into [name] A WEEK     Endocrinology:  Diabetes - GLP-1 Receptor Agonists Failed - 05/02/2021  2:35 PM      Failed - HBA1C is between 0 and 7.9 and within 180 days    Hemoglobin A1C  Date Value Ref Range Status  01/29/2021 10.9 (A) 4.0 - 5.6 % Final   Hgb A1c MFr Bld  Date Value Ref Range Status  06/06/2020 14.9 (H) 4.8 - 5.6 % Final    Comment:             Prediabetes: 5.7 - 6.4          Diabetes: >6.4          Glycemic control for adults with diabetes: <7.0          Passed - Valid encounter within last 6 months    Recent Outpatient Visits          1 week ago Acute cystitis without hematuria   [name], [name], PA-C   3 months ago Urinary frequency   [name], MD   4 months ago Acute cystitis without hematuria   [place], PA-C   11 months ago Uncontrolled type 2 diabetes mellitus with hyperglycemia [facility])   [name], PA-C   11 months ago Urinary incontinence, unspecified type   [name], [name], [place]

## 2021-05-08 ENCOUNTER — Ambulatory Visit (INDEPENDENT_AMBULATORY_CARE_PROVIDER_SITE_OTHER): Payer: Medicare Other

## 2021-05-08 DIAGNOSIS — E119 Type 2 diabetes mellitus without complications: Secondary | ICD-10-CM

## 2021-05-08 DIAGNOSIS — E7800 Pure hypercholesterolemia, unspecified: Secondary | ICD-10-CM

## 2021-05-08 MED ORDER — TRULICITY 1.5 MG/0.5ML ~~LOC~~ SOAJ: 1.5000 mg | SUBCUTANEOUS | 1 refills | Status: DC

## 2021-05-08 NOTE — Patient Instructions (Signed)
Visit Information It was great speaking with you today!  Please let me know if you have any questions about our visit.   Goals Addressed             This Visit's Progress    Monitor and Manage My Blood Sugar-Diabetes Type 2   On track    Timeframe:  Long-Range Goal Priority:  High Start Date: 03/27/2021                            Expected End Date: 03/27/2022                      Follow Up within 30 days   - check blood sugar daily before breakfast - check blood sugar if I feel it is too high or too low - enter blood sugar readings and medication or insulin into daily log - take the blood sugar log to all doctor visits    Why is this important?   Checking your blood sugar at home helps to keep it from getting very high or very low.  Writing the results in a diary or log helps the doctor know how to care for you.  Your blood sugar log should have the time, date and the results.  Also, write down the amount of insulin or other medicine that you take.  Other information, like what you ate, exercise done and how you were feeling, will also be helpful.     Notes:         Patient Care Plan: Diabetes Type 2 (Adult)     Problem Identified: Disease Progression (Diabetes, Type 2)      Long-Range Goal: Disease Progression Prevented or Minimized   Start Date: 12/11/2020  Expected End Date: 04/10/2021  Priority: High  Note:   Objective:  Lab Results  Component Value Date   HGBA1C 14.9 (H) 06/06/2020    Lab Results  Component Value Date   HGBA1C 10.9 (A) 01/29/2021    Current Barriers:  Chronic Disease Management support and educational needs related to Diabetes self-management.  Case Manager Clinical Goal(s):  Over the next 120 days, patient will demonstrate improved adherence to prescribed treatment plan for diabetes self care/management as evidenced by: taking medications as prescribed, daily monitoring and recording CBG's,  adherence to an ADA/ carb modified  diet.   Interventions:  Collaboration with PCP regarding development and update of comprehensive plan of care as evidenced by provider attestation and co-signature Inter-disciplinary care team collaboration (see longitudinal plan of care) Discussed s/sx of hypoglycemia and hyperglycemia along with recommended interventions. Reviewed blood glucose readings. Reports numbers have improved but continues to have high readings in the 250's. She is very eager to achieve better glycemic control but prefers not to use insulin. Reports taking glipizide and metformin as prescribed. Overall A1C has improved. Decreased for 14.9% to 10.9%. Collaborated with the CCM Pharmacy regarding patient's request to discuss medications for better glycemic control. The Pharmacist will reach out within the next few weeks. Discussed nutritional intake. Reports nutritional intake has improved but still attempting to monitor carb intake. Advised to continue reading nutrition labels and limiting intake of concentrated sugars. Advised to update the team if she is willing to speak with a dietician. Reviewed recommended diabetic exams. Continues to perform daily foot care. Denies significant changes with vision but notes an increased glare when readings. She is due for an eye exam. Advised to contact Optometry  to schedule as soon as possible.   Self-Care/Patient Goals: Self administer medications as prescribed Attend all scheduled provider appointments Monitor blood glucose levels and record readings Engage in low impact activity as tolerated Adhere to prescribed ADA/carb modified Complete outreach with the CCM Pharmacist Contact Optometry to schedule an updated eye exam Contact clinic with questions and new concerns as needed   Follow Up Plan:  Will follow up in two months    Patient Care Plan: Hypertension (Adult)     Problem Identified: Hypertension (Hypertension)      Long-Range Goal: Hypertension Monitored  Completed 03/13/2021  Start Date: 12/11/2020  Expected End Date: 04/10/2021  Priority: High  Note:    Current Barriers:  Chronic Disease Management support and educational needs related to Hypertension and Dyslipidemia.  Case Manager Clinical Goal(s):  Over the next 120 days, patient will demonstrate improved adherence to prescribed treatment plan as evidenced by taking all medications as prescribed, monitoring and recording blood pressure and adhering to a low sodium/DASH diet.  Interventions:  Inter-disciplinary care team collaboration (see longitudinal plan of care) Reviewed compliance with current treatment plan. Advised to continue taking medications as prescribed. Advised to update her  PCP if unable to tolerate regimen. Currently tolerating statin well. Reviewed established BP parameters and encouraged to monitor and record readings. Advised to continue monitoring sodium intake and attempt to avoid highly processed foods when possible. Reviewed s/sx of heart attack, stroke and worsening symptoms that require immediate medical attention.   Self-Care/Patient Goals: Self administer medications as prescribed Attend all scheduled provider appointments Monitor BP and record readings Adhere to a low sodium/DASH diet Engage in low impact activity as tolerated Contact provider office with questions and new concerns as needed     Patient Care Plan: General Pharmacy (Adult)     Problem Identified: Hypertension, Hyperlipidemia, Diabetes, and Chronic Pain   Priority: High     Long-Range Goal: Patient-Specific Goal   Start Date: 03/27/2021  Expected End Date: 03/27/2022  This Visit's Progress: On track  Recent Progress: On track  Priority: High  Note:   Current Barriers:  Unable to achieve control of diabetes   Pharmacist Clinical Goal(s):  Patient will maintain control of diabetes as evidenced by A1c less than 8%  through collaboration with PharmD and provider.    Interventions: 1:1 collaboration with Gwyneth Sprout, FNP regarding development and update of comprehensive plan of care as evidenced by provider attestation and co-signature Inter-disciplinary care team collaboration (see longitudinal plan of care) Comprehensive medication review performed; medication list updated in electronic medical record  Hypertension (BP goal <130/80) -Controlled -Current treatment: Amlodipine 10 mg daily  Metoprolol 25 mg twice daily  -Medications previously tried: NA  -Current home readings: Does not monitor at home -Denies hypotensive/hypertensive symptoms -Questionably appropriate use of beta blocker given lack of clear indication. Will address at future visit -Recommended to continue current medication  Hyperlipidemia: (LDL goal < 70) -Uncontrolled -Current treatment: Atorvastatin 20 mg daily  -Current treatment: Aspirin 81 mg daily  -Medications previously tried: NA  -Continue current medications  Diabetes (A1c goal <8%) -Uncontrolled -Diagnosed 2018 -Current medications: Metformin 500 mg 2 tablets twice daily  Trulicity 1.54 mg weekly -Medications previously tried: NA  -Current home glucose readings:  Fasting: 160-200, lowest was 130, highest was 262. -Denies hypoglycemic/hyperglycemic symptoms.   -Current meal patterns:  Breakfast: Eggs + Berniece Salines, often skips.  Lunch: Salad OR sandwich, often skips  Supper: Spaghetti OR sandwich drinks: Mainly water. 1/2 cup of soda ~1x  weekly.  -Current exercise: Unable walk due to leg pain.  -Extensive diabetic counseling discussed with patient.   -Increase Trulicity to 1.5 mg weekly.   Chronic Pain (Goal: Minimize pain symptoms) -Not ideally controlled -Managed by Dr. Holley Raring -Current treatment  Gabapentin 300 mg twice daily  -Medications previously tried: NA  -Recommended to continue current medication  Patient Goals/Self-Care Activities Patient will:  - check glucose daily before breakfast,  document, and provide at future appointments  Follow Up Plan: Telephone follow up appointment with care management team member scheduled for:  06/05/2021 at 8:30 AM    Patient agreed to services and verbal consent obtained.   Patient verbalizes understanding of instructions provided today and agrees to view in [address].   Junius Argyle, PharmD, Para March, CPP  Clinical Pharmacist Practitioner  [facility] [phone]

## 2021-05-08 NOTE — Progress Notes (Signed)
Chronic Care Management Pharmacy Note  05/08/2021 Name:  Emily Stokes MRN:  [id number] DOB:  Jul 31, 1960  Summary: Patient presents for CCM follow-up  Recommendations/Changes made from today's visit: -Increase Trulicity to 1.5 mg weekly.   Recheck at PCP follow-up:  A1c Fasting Lipid Panel  Vitamin b12   CBC CMP  Plan: CPP follow-up in one month   Subjective: Emily Stokes is an 60 y.o. year old female who is a primary patient of Gwyneth Sprout, FNP.  The CCM team was consulted for assistance with disease management and care coordination needs.    Engaged with patient by telephone for follow up visit in response to provider referral for pharmacy case management and/or care coordination services.   Consent to Services:  The patient was given information about Chronic Care Management services, agreed to services, and gave verbal consent prior to initiation of services.  Please see initial visit note for detailed documentation.   Patient Care Team: Gwyneth Sprout, FNP as PCP - General (Family Medicine) [place], [place] (Optometry) Gillis Santa, MD as Consulting Physician (Pain Medicine) Neldon Labella, RN as Case Manager Germaine Pomfret, [facility] (Pharmacist)  Recent office visits: 04/19/21: Patient presented to [facility], PA-C for UTI. Macrobid.  01/29/2021 Lelon Huh, MD (PCP Office Visit) for Urinary Frequency- Started: Glipizide 2.5 mg daily, Lab order placed, patient instructed to follow-up in 2 months   12/04/2020 [name], PA-C (PCP Office Visit) for Follow-up- No medication changes noted, POCT Urinalysis dipstick order placed, no follow-up noted.  Recent consult visits: 01/17/2021 Gillis Santa, MD (Pain Management Telemedicine) for Follow-up- No medication changes noted, no orders placed, no follow-up noted   12/20/2020 Gillis Santa, MD (Pain Management) for Hip Pain- Procedure Office Visit for Therapeutic InterLaminar Epidural Steroid  Injection- No medication changes noted, Patient instructed to follow-up in 4 weeks.    12/14/2020 Gillis Santa, MD (Pain Management) for Pain- Stopped: Benzonatate 100 mg due to patient not taking, no orders placed, patient instructed to follow-up in 6 days  Hospital visits: Medication Reconciliation was completed by comparing discharge summary, patients EMR and Pharmacy list, and upon discussion with patient.  Objective:  Lab Results  Component Value Date   CREATININE 1.10 (H) 06/06/2020   BUN 12 06/06/2020   GFRNONAA 55 (L) 06/06/2020   GFRAA 63 06/06/2020   NA 140 06/06/2020   K 4.3 06/06/2020   CALCIUM 9.4 06/06/2020   CO2 19 (L) 06/06/2020   GLUCOSE 183 (H) 06/06/2020    Lab Results  Component Value Date/Time   HGBA1C 10.9 (A) 01/29/2021 09:26 AM   HGBA1C 14.9 (H) 06/06/2020 10:44 AM   HGBA1C 7.9 (H) 07/28/2019 08:25 AM   MICROALBUR 50 06/06/2020 10:48 AM   MICROALBUR 20 01/30/2018 04:54 PM    Last diabetic Eye exam:  Lab Results  Component Value Date/Time   HMDIABEYEEXA No Retinopathy 09/15/2019 12:00 AM    Last diabetic Foot exam: No results found for: HMDIABFOOTEX   Lab Results  Component Value Date   CHOL 163 07/28/2019   HDL 40 07/28/2019   LDLCALC 107 (H) 07/28/2019   TRIG 82 07/28/2019   CHOLHDL 5.3 (H) 04/20/2019    Hepatic Function Latest Ref Rng & Units 06/06/2020 07/28/2019 04/20/2019  Total Protein 6.0 - 8.5 g/dL 7.4 7.4 7.2  Albumin 3.8 - 4.9 g/dL 4.0 4.4 4.1  AST 0 - 40 IU/L _0 ALT 0 - 32 IU/L _1 Alk Phosphatase 44 -  121 IU/L 116 114 95  Total Bilirubin 0.0 - 1.2 mg/dL 0.4 0.4 0.2    Lab Results  Component Value Date/Time   TSH 1.500 07/28/2019 08:25 AM   TSH 1.600 11/06/2018 11:10 AM    CBC Latest Ref Rng & Units 07/28/2019 04/20/2019 11/06/2018  WBC 3.4 - 10.8 x10E3/uL 6.0 5.3 5.7  Hemoglobin 11.1 - 15.9 g/dL 11.7 11.1 11.6  Hematocrit 34.0 - 46.6 % 35.1 35.6 34.3  Platelets 150 - 450 x10E3/uL 374 396 358    No results  found for: VD25OH  Clinical ASCVD: No  The 10-year ASCVD risk score (Arnett DK, et al., 2019) is: 14.2%   Values used to calculate the score:     Age: 50 years     Sex: Female     Is Non-Hispanic African American: Yes     Diabetic: Yes     Tobacco smoker: No     Systolic Blood Pressure: 053 mmHg     Is BP treated: Yes     HDL Cholesterol: 40 mg/dL     Total Cholesterol: 163 mg/dL    Depression screen [facility] 2/9 12/11/2020 05/17/2020 05/03/2020  Decreased Interest 0 0 0  Down, Depressed, Hopeless 0 0 0  PHQ - 2 Score 0 0 0  Altered sleeping - - -  Tired, decreased energy - - -  Change in appetite - - -  Feeling bad or failure about yourself  - - -  Trouble concentrating - - -  Moving slowly or fidgety/restless - - -  Suicidal thoughts - - -  PHQ-9 Score - - -  Difficult doing work/chores - - -    Social History   Tobacco Use  Smoking Status Never  Smokeless Tobacco Never   BP Readings from Last 3 Encounters:  04/19/21 122/79  01/29/21 138/87  12/20/20 109/74   Pulse Readings from Last 3 Encounters:  04/19/21 82  01/29/21 79  12/20/20 72   Wt Readings from Last 3 Encounters:  04/19/21 224 lb (101.6 kg)  01/29/21 223 lb (101.2 kg)  12/20/20 238 lb (108 kg)   BMI Readings from Last 3 Encounters:  04/19/21 33.08 kg/m  01/29/21 32.93 kg/m  12/20/20 35.15 kg/m    Assessment/Interventions: Review of patient past medical history, allergies, medications, health status, including review of consultants reports, laboratory and other test data, was performed as part of comprehensive evaluation and provision of chronic care management services.   SDOH:  (Social Determinants of Health) assessments and interventions performed: Yes SDOH Interventions    Flowsheet Row Most Recent Value  SDOH Interventions   Financial Strain Interventions Intervention Not Indicated       SDOH Screenings   Alcohol Screen: Not on file  Depression (PHQ2-9): Low Risk    PHQ-2 Score: 0   Financial Resource Strain: Low Risk    Difficulty of Paying Living Expenses: Not hard at all  Food Insecurity: No Food Insecurity   Worried About Charity fundraiser in the Last Year: Never true   Ran Out of Food in the Last Year: Never true  Housing: Not on file  Physical Activity: Not on file  Social Connections: Not on file  Stress: Not on file  Tobacco Use: Low Risk    Smoking Tobacco Use: Never   Smokeless Tobacco Use: Never   Passive Exposure: Not on file  Transportation Needs: No Transportation Needs   Lack of Transportation (Medical): No   Lack of Transportation (Non-Medical): No    CCM  Care Plan  Allergies  Allergen Reactions   Bactrim [Sulfamethoxazole-Trimethoprim] Swelling    Medications Reviewed Today     Reviewed by Gillis Santa, MD (Physician) on 04/24/21 at [place] List Status: <None>   Medication Order Taking? Sig Documenting Provider Last Dose Status Informant  acetaminophen (TYLENOL) 500 MG tablet [id number] Yes Take 500 mg by mouth every 6 (six) hours as needed. [provider] Taking Active   amLODipine (NORVASC) 10 MG tablet [id number] Yes Take 1 tablet (10 mg total) by mouth daily. Birdie Sons, MD Taking Active   aspirin 81 MG EC tablet [id number] Yes Take 1 tablet (81 mg total) by mouth daily. Swallow whole. Birdie Sons, MD Taking Active   atorvastatin (LIPITOR) 20 MG tablet [id number] Yes Take 1 tablet (20 mg total) by mouth daily. Please schedule an office visit before anymore refills. Birdie Sons, MD Taking Active   Blood Glucose Monitoring Suppl (ACCU-CHEK GUIDE ME) w/Device Drucie Opitz [id number] Yes  [provider] Taking Active   Dulaglutide (TRULICITY [place]) [id number] Yes Inject into the skin once a week. [provider] Taking Active   Dulaglutide (TRULICITY) 5.36 UY/4.0HK SOPN [id number] Yes Inject 0.75 mg into the skin once a week. Birdie Sons, MD Taking Active   gabapentin (NEURONTIN) 300 MG capsule [id number]  Yes Take 1 capsule (300 mg total) by mouth 2 (two) times daily. Mecum, Erin E, PA-C Taking Active   Ibuprofen-diphenhydrAMINE Cit (IBUPROFEN PM PO) [id number] Yes Take by mouth at bedtime as needed. [provider] Taking Active   Lancet Devices (EASY MINI EJECT LANCING DEVICE) MISC [id number] Yes  [provider] Taking Active   Lancets [facility] DELICA PLUS ATFTDD22G) [place] [id number] Yes USE AS DIRECTED TWICE DAILY IN THE MORNING AND AT BEDTIME Chrismon, [name], PA-C Taking Active   metFORMIN (GLUCOPHAGE) 500 MG tablet [id number] Yes Take 2 tablets (1,000 mg total) by mouth 2 (two) times daily with a meal. Birdie Sons, MD Taking Active   metoprolol tartrate (LOPRESSOR) 25 MG tablet [id number] Yes Take 1 tablet (25 mg total) by mouth 2 (two) times daily. Birdie Sons, MD Taking Active   nitrofurantoin, macrocrystal-monohydrate, (MACROBID) 100 MG capsule [id number] Yes Take 1 capsule (100 mg total) by mouth 2 (two) times daily for 7 days. Mecum, Erin E, PA-C Taking Active   omega-3 acid ethyl esters (LOVAZA) 1 g capsule [id number] Yes Take 1 capsule by mouth 2 (two) times daily. [provider] Taking Active   Med List Note Landis Martins, RN 07/13/20 8182): UDS 10-21-19 MR 09/11/2020 Opiod contracts signed 02/12/18 07-13-20 PA request for Tramadol submitted. Key [id number]  dw            Patient Active Problem List   Diagnosis Date Noted   Acute cystitis without hematuria 04/19/2021   Spinal stenosis, lumbar region, with neurogenic claudication 04/27/2020   Personal history of colonic polyps    Polyp of transverse colon    Lumbar facet arthropathy 03/18/2018   Lumbar spondylosis 03/18/2018   Encounter for screening colonoscopy    Internal hemorrhoids    Benign neoplasm of descending colon    Benign neoplasm of cecum    Type 2 diabetes mellitus without complication, without long-term current use of insulin ([place]) 07/31/2017   Recurrent abdominal hernia  without obstruction or gangrene 07/24/2017   Cellulitis of face 05/30/2016   Chronic pain syndrome 05/30/2016   Nasal septal abscess 05/30/2016   Pain in elbow 02/05/2016   Lumbar degenerative  disc disease 09/14/2015   Facet syndrome, lumbar 09/14/2015   Lumbar radiculopathy 09/14/2015   Sacroiliac joint dysfunction 09/14/2015   History of chicken pox 02/02/2015   Leg pain 02/02/2015   Hypercholesteremia 02/02/2015   Blood glucose elevated 02/02/2015   Pain in soft tissues of limb 02/02/2015   Nonspecific ST-T changes 02/02/2015   Awareness of heartbeats 02/02/2015   Pain in extremity at multiple sites 02/02/2015   Snores 02/02/2015   Hernia of anterior abdominal wall 02/24/2012   Pain in shoulder 12/05/2011   Adnexal mass 10/30/2011   Pelvic cyst 10/30/2011   Palpitations 03/14/2011   HTN (hypertension) 03/14/2011   Abnormal finding on EKG 03/14/2011   Obesity 03/14/2011   Infected surgical wound 09/15/2008   Urinary system disease 08/10/2008   Benign essential HTN 07/12/2008   Arthralgia of lower leg 03/28/2008   Anemia, iron deficiency 06/17/2007   Calculus of kidney 05/26/2007    Immunization History  Administered Date(s) Administered   Influenza Split 05/26/2007   Influenza,inj,Quad PF,6+ Mos 05/31/2016, 05/19/2017, 01/30/2018, 02/11/2020   PFIZER(Purple Top)SARS-COV-2 Vaccination 08/05/2019, 08/31/2019   Pneumococcal Polysaccharide-23 01/30/2018    Conditions to be addressed/monitored:  Hypertension, Hyperlipidemia, Diabetes, and Chronic Pain  Care Plan : General Pharmacy (Adult)  Updates made by Germaine Pomfret, [place] since 05/08/2021 12:00 AM     Problem: Hypertension, Hyperlipidemia, Diabetes, and Chronic Pain   Priority: High     Long-Range Goal: Patient-Specific Goal   Start Date: 03/27/2021  Expected End Date: 03/27/2022  This Visit's Progress: On track  Recent Progress: On track  Priority: High  Note:   Current Barriers:  Unable to achieve  control of diabetes   Pharmacist Clinical Goal(s):  Patient will maintain control of diabetes as evidenced by A1c less than 8%  through collaboration with PharmD and provider.   Interventions: 1:1 collaboration with Gwyneth Sprout, FNP regarding development and update of comprehensive plan of care as evidenced by provider attestation and co-signature Inter-disciplinary care team collaboration (see longitudinal plan of care) Comprehensive medication review performed; medication list updated in electronic medical record  Hypertension (BP goal <130/80) -Controlled -Current treatment: Amlodipine 10 mg daily  Metoprolol 25 mg twice daily  -Medications previously tried: NA  -Current home readings: Does not monitor at home -Denies hypotensive/hypertensive symptoms -Questionably appropriate use of beta blocker given lack of clear indication. Will address at future visit -Recommended to continue current medication  Hyperlipidemia: (LDL goal < 70) -Uncontrolled -Current treatment: Atorvastatin 20 mg daily  -Current treatment: Aspirin 81 mg daily  -Medications previously tried: NA  -Continue current medications  Diabetes (A1c goal <8%) -Uncontrolled -Diagnosed 2018 -Current medications: Metformin 500 mg 2 tablets twice daily  Trulicity 7.04 mg weekly -Medications previously tried: NA  -Current home glucose readings:  Fasting: 160-200, lowest was 130, highest was 262. -Denies hypoglycemic/hyperglycemic symptoms.   -Current meal patterns:  Breakfast: Eggs + Berniece Salines, often skips.  Lunch: Salad OR sandwich, often skips  Supper: Spaghetti OR sandwich drinks: Mainly water. 1/2 cup of soda ~1x weekly.  -Current exercise: Unable walk due to leg pain.  -Extensive diabetic counseling discussed with patient.   -Increase Trulicity to 1.5 mg weekly.   Chronic Pain (Goal: Minimize pain symptoms) -Not ideally controlled -Managed by Dr. Holley Raring -Current treatment  Gabapentin 300 mg twice daily   -Medications previously tried: NA  -Recommended to continue current medication  Patient Goals/Self-Care Activities Patient will:  - check glucose daily before breakfast, document, and provide at future appointments  Follow Up  Plan: Telephone follow up appointment with care management team member scheduled for:  06/05/2021 at 8:30 AM     Medication Assistance: None required.  Patient affirms current coverage meets needs.  Compliance/Adherence/Medication fill history: Care Gaps: Zoster Vaccines PNA Vaccine COVID-19 Vaccine Booster 3 Diabetic Eye Exam Influenza Vaccine A1C > 9 (recheck next month)  Star-Rating Drugs: Atorvastatin 20 mg last filled on 04/04/2021 for a 30-Day supply with [facility] Metformin 500 mg  last filled on 04/04/2021 for a 30-Day supply with [facility] Trulicity 0.52 mg  last filled on 04/04/2021 for a 30-Day supply with [facility]   Patient's preferred pharmacy is:  [facility] - [place], [place] - [address] [place] [address] Phone: [phone] Fax: [phone]  [id number] [id number]: [phone]: [phone] Patient decided to: Utilize [facility] for medication synchronization, packaging and delivery  Care Plan and Follow Up Patient Decision:  Patient agrees to Care Plan and Follow-up.  Plan: Telephone follow up appointment with care management team member scheduled for:  06/05/2021 at 8:30 AM  AJunius Argyle PharmD, BPara March [phone]

## 2021-05-09 ENCOUNTER — Ambulatory Visit (INDEPENDENT_AMBULATORY_CARE_PROVIDER_SITE_OTHER): Payer: Medicare Other

## 2021-05-09 DIAGNOSIS — Z0000 Encounter for general adult medical examination without abnormal findings: Secondary | ICD-10-CM | POA: Diagnosis not present

## 2021-05-09 DIAGNOSIS — Z1231 Encounter for screening mammogram for malignant neoplasm of breast: Secondary | ICD-10-CM | POA: Diagnosis not present

## 2021-05-09 NOTE — Patient Instructions (Addendum)
Emily Stokes , Thank you for taking time to come for your Medicare Wellness Visit. I appreciate your ongoing commitment to your health goals. Please review the following plan we discussed and let me know if I can assist you in the future.   Screening recommendations/referrals: Colonoscopy: cologuard 08/20/19 due again in 2024 Mammogram: 07/29/19 Bone Density: n/d Recommended yearly ophthalmology/optometry visit for glaucoma screening and checkup Recommended yearly dental visit for hygiene and checkup  Vaccinations: Influenza vaccine: n/d Pneumococcal vaccine: 01/30/18 Tdap vaccine: n/d Shingles vaccine: n/d  Covid-19: 08/05/19, 08/31/19   Advanced directives: no  Conditions/risks identified: none  Next appointment: Follow up in one year for your annual wellness visit. 05/14/21 @ 1 p.m.  Preventive Care 40-64 Years, Female Preventive care refers to lifestyle choices and visits with your health care provider that can promote health and wellness. What does preventive care include? A yearly physical exam. This is also called an annual well check. Dental exams once or twice a year. Routine eye exams. Ask your health care provider how often you should have your eyes checked. Personal lifestyle choices, including: Daily care of your teeth and gums. Regular physical activity. Eating a healthy diet. Avoiding tobacco and drug use. Limiting alcohol use. Practicing safe sex. Taking low-dose aspirin daily starting at age 90. Taking vitamin and mineral supplements as recommended by your health care provider. What happens during an annual well check? The services and screenings done by your health care provider during your annual well check will depend on your age, overall health, lifestyle risk factors, and family history of disease. Counseling  Your health care provider may ask you questions about your: Alcohol use. Tobacco use. Drug use. Emotional well-being. Home and relationship  well-being. Sexual activity. Eating habits. Work and work Statistician. Method of birth control. Menstrual cycle. Pregnancy history. Screening  You may have the following tests or measurements: Height, weight, and BMI. Blood pressure. Lipid and cholesterol levels. These may be checked every 5 years, or more frequently if you are over 33 years old. Skin check. Lung cancer screening. You may have this screening every year starting at age 67 if you have a 30-pack-year history of smoking and currently smoke or have quit within the past 15 years. Fecal occult blood test (FOBT) of the stool. You may have this test every year starting at age 5. Flexible sigmoidoscopy or colonoscopy. You may have a sigmoidoscopy every 5 years or a colonoscopy every 10 years starting at age 101. Hepatitis C blood test. Hepatitis B blood test. Sexually transmitted disease (STD) testing. Diabetes screening. This is done by checking your blood sugar (glucose) after you have not eaten for a while (fasting). You may have this done every 1-3 years. Mammogram. This may be done every 1-2 years. Talk to your health care provider about when you should start having regular mammograms. This may depend on whether you have a family history of breast cancer. BRCA-related cancer screening. This may be done if you have a family history of breast, ovarian, tubal, or peritoneal cancers. Pelvic exam and Pap test. This may be done every 3 years starting at age 52. Starting at age 49, this may be done every 5 years if you have a Pap test in combination with an HPV test. Bone density scan. This is done to screen for osteoporosis. You may have this scan if you are at high risk for osteoporosis. Discuss your test results, treatment options, and if necessary, the need for more tests with your health  care provider. Vaccines  Your health care provider may recommend certain vaccines, such as: Influenza vaccine. This is recommended every  year. Tetanus, diphtheria, and acellular pertussis (Tdap, Td) vaccine. You may need a Td booster every 10 years. Zoster vaccine. You may need this after age 32. Pneumococcal 13-valent conjugate (PCV13) vaccine. You may need this if you have certain conditions and were not previously vaccinated. Pneumococcal polysaccharide (PPSV23) vaccine. You may need one or two doses if you smoke cigarettes or if you have certain conditions. Talk to your health care provider about which screenings and vaccines you need and how often you need them. This information is not intended to replace advice given to you by your health care provider. Make sure you discuss any questions you have with your health care provider. Document Released: 05/26/2015 Document Revised: 01/17/2016 Document Reviewed: 02/28/2015 Elsevier Interactive Patient Education  2017 [place] Prevention in the Home Falls can cause injuries. They can happen to people of all ages. There are many things you can do to make your home safe and to help prevent falls. What can I do on the outside of my home? Regularly fix the edges of walkways and driveways and fix any cracks. Remove anything that might make you trip as you walk through a door, such as a raised step or threshold. Trim any bushes or trees on the path to your home. Use bright outdoor lighting. Clear any walking paths of anything that might make someone trip, such as rocks or tools. Regularly check to see if handrails are loose or broken. Make sure that both sides of any steps have handrails. Any raised decks and porches should have guardrails on the edges. Have any leaves, snow, or ice cleared regularly. Use sand or salt on walking paths during winter. Clean up any spills in your garage right away. This includes oil or grease spills. What can I do in the bathroom? Use night lights. Install grab bars by the toilet and in the tub and shower. Do not use towel bars as grab  bars. Use non-skid mats or decals in the tub or shower. If you need to sit down in the shower, use a plastic, non-slip stool. Keep the floor dry. Clean up any water that spills on the floor as soon as it happens. Remove soap buildup in the tub or shower regularly. Attach bath mats securely with double-sided non-slip rug tape. Do not have throw rugs and other things on the floor that can make you trip. What can I do in the bedroom? Use night lights. Make sure that you have a light by your bed that is easy to reach. Do not use any sheets or blankets that are too big for your bed. They should not hang down onto the floor. Have a firm chair that has side arms. You can use this for support while you get dressed. Do not have throw rugs and other things on the floor that can make you trip. What can I do in the kitchen? Clean up any spills right away. Avoid walking on wet floors. Keep items that you use a lot in easy-to-reach places. If you need to reach something above you, use a strong step stool that has a grab bar. Keep electrical cords out of the way. Do not use floor polish or wax that makes floors slippery. If you must use wax, use non-skid floor wax. Do not have throw rugs and other things on the floor that can  make you trip. What can I do with my stairs? Do not leave any items on the stairs. Make sure that there are handrails on both sides of the stairs and use them. Fix handrails that are broken or loose. Make sure that handrails are as long as the stairways. Check any carpeting to make sure that it is firmly attached to the stairs. Fix any carpet that is loose or worn. Avoid having throw rugs at the top or bottom of the stairs. If you do have throw rugs, attach them to the floor with carpet tape. Make sure that you have a light switch at the top of the stairs and the bottom of the stairs. If you do not have them, ask someone to add them for you. What else can I do to help prevent  falls? Wear shoes that: Do not have high heels. Have rubber bottoms. Are comfortable and fit you well. Are closed at the toe. Do not wear sandals. If you use a stepladder: Make sure that it is fully opened. Do not climb a closed stepladder. Make sure that both sides of the stepladder are locked into place. Ask someone to hold it for you, if possible. Clearly mark and make sure that you can see: Any grab bars or handrails. First and last steps. Where the edge of each step is. Use tools that help you move around (mobility aids) if they are needed. These include: Canes. Walkers. Scooters. Crutches. Turn on the lights when you go into a dark area. Replace any light bulbs as soon as they burn out. Set up your furniture so you have a clear path. Avoid moving your furniture around. If any of your floors are uneven, fix them. If there are any pets around you, be aware of where they are. Review your medicines with your doctor. Some medicines can make you feel dizzy. This can increase your chance of falling. Ask your doctor what other things that you can do to help prevent falls. This information is not intended to replace advice given to you by your health care provider. Make sure you discuss any questions you have with your health care provider. Document Released: 02/23/2009 Document Revised: 10/05/2015 Document Reviewed: 06/03/2014 Elsevier Interactive Patient Education  2017 [employer].

## 2021-05-09 NOTE — Progress Notes (Signed)
Virtual Visit via Telephone Note  I connected with  Emily Stokes on 05/09/21 at  1:40 PM EST by telephone and verified that I am speaking with the correct person using two identifiers.  Location: Patient: home  Provider: [facility] Persons participating in the virtual visit: [place]   I discussed the limitations, risks, security and privacy concerns of performing an evaluation and management service by telephone and the availability of in person appointments. The patient expressed understanding and agreed to proceed.  Interactive audio and video telecommunications were attempted between this nurse and patient, however failed, due to patient having technical difficulties OR patient did not have access to video capability.  We continued and completed visit with audio only.  Some vital signs may be absent or patient reported.   Dionisio David, LPN  Subjective:   Emily Stokes is a 60 y.o. female who presents for Medicare Annual (Subsequent) preventive examination.  Review of Systems           Objective:    There were no vitals filed for this visit. There is no height or weight on file to calculate BMI.  Advanced Directives 05/09/2021 12/20/2020 05/17/2020 05/03/2020 04/27/2020 10/21/2019 08/20/2019  Does Patient Have a Medical Advance Directive? _0  No No  Would patient like information on creating a medical advance directive? No - Patient declined No - Patient declined No - Patient declined No - Patient declined No - Patient declined - -    Current Medications (verified) Outpatient Encounter Medications as of 05/09/2021  Medication Sig   acetaminophen (TYLENOL) 500 MG tablet Take 500 mg by mouth every 6 (six) hours as needed.   amLODipine (NORVASC) 10 MG tablet Take 1 tablet (10 mg total) by mouth daily.   aspirin 81 MG EC tablet Take 1 tablet (81 mg total) by mouth daily. Swallow whole.   atorvastatin (LIPITOR) 20 MG tablet Take 1 tablet (20 mg total)  by mouth daily. Please schedule an office visit before anymore refills.   Blood Glucose Monitoring Suppl (ACCU-CHEK GUIDE ME) w/Device KIT    Dulaglutide (TRULICITY) 1.5 ZO/1.0RU SOPN Inject 1.5 mg into the skin once a week.   gabapentin (NEURONTIN) 300 MG capsule Take 1 capsule (300 mg total) by mouth 3 (three) times daily.   Ibuprofen-diphenhydrAMINE Cit (IBUPROFEN PM PO) Take by mouth at bedtime as needed.   Lancet Devices (EASY MINI EJECT LANCING DEVICE) MISC    Lancets (ONETOUCH DELICA PLUS [id number]) MISC USE AS DIRECTED TWICE DAILY IN THE MORNING AND AT BEDTIME   metFORMIN (GLUCOPHAGE) 500 MG tablet Take 2 tablets (1,000 mg total) by mouth 2 (two) times daily with a meal.   metoprolol tartrate (LOPRESSOR) 25 MG tablet Take 1 tablet (25 mg total) by mouth 2 (two) times daily.   No facility-administered encounter medications on file as of 05/09/2021.    Allergies (verified) Bactrim [sulfamethoxazole-trimethoprim]   History: Past Medical History:  Diagnosis Date   Abdominal pain, epigastric    Anemia    Backache    Bronchitis    Calculus, kidney    Carpal tunnel syndrome    Chest pain    Chicken pox    Circulatory disease    Diabetes mellitus without complication (HCC)    Disorder of kidney and ureter    Excess, menstruation    Hypercholesteremia    Hypertension, essential, benign    Infected postoperative seroma    Iron deficiency    Malaise and fatigue  Measles    MRSA (methicillin resistant staph aureus) culture positive    Nausea alone    Nonspecific abnormal electrocardiogram (ECG) (EKG)    Pain in joint, lower leg    Past Surgical History:  Procedure Laterality Date   ABDOMINAL HYSTERECTOMY     CESAREAN SECTION     COLONOSCOPY  1997   COLONOSCOPY WITH PROPOFOL N/A 01/09/2018   Procedure: COLONOSCOPY WITH PROPOFOL;  Surgeon: Virgel Manifold, MD;  Location: [facility] ENDOSCOPY;  Service: Endoscopy;  Laterality: N/A;   COLONOSCOPY WITH PROPOFOL N/A 08/20/2019    Procedure: COLONOSCOPY WITH PROPOFOL;  Surgeon: Lucilla Lame, MD;  Location: [facility] ENDOSCOPY;  Service: Endoscopy;  Laterality: N/A;   COLOSTOMY TAKEDOWN  2012   Hartmann's procedure.     HERNIA REPAIR  [id number]   umbilical   LITHOTRIPSY  [id number]   with complications, hospitalized due to these complications for 3 months   mrsa     removal on nose   PARTIAL HYSTERECTOMY  2009    vaginal hysterectomy, has both ovaries   Family History  Problem Relation Age of Onset   Hypertension Father    Stroke Father    Diabetes Mother    Hypertension Mother    Diabetes Other        sibling   Diabetes Other        sibling   Diabetes Other        sibling   Hypertension Other        sibling   Hypertension Other        sibling   Hypertension Other        sibling   Social History   Socioeconomic History   Marital status: Widowed    Spouse name: Not on file   Number of children: 4   Years of education: Not on file   Highest education level: Bachelor's degree (e.g., BA, AB, BS)  Occupational History   Occupation: Control and instrumentation engineer    Comment: disability  Tobacco Use   Smoking status: Never   Smokeless tobacco: Never  Vaping Use   Vaping Use: Never used  Substance and Sexual Activity   Alcohol use: No    Alcohol/week: 0.0 standard drinks   Drug use: No   Sexual activity: Never  Other Topics Concern   Not on file  Social History Narrative   Not on file   Social Determinants of Health   Financial Resource Strain: Low Risk    Difficulty of Paying Living Expenses: Not hard at all  Food Insecurity: No Food Insecurity   Worried About Charity fundraiser in the Last Year: Never true   [place] in the Last Year: Never true  Transportation Needs: No Transportation Needs   Lack of Transportation (Medical): No   Lack of Transportation (Non-Medical): No  Physical Activity: Inactive   Days of Exercise per Week: 0 days   Minutes of Exercise per Session: 0 min  Stress: No Stress  Concern Present   Feeling of Stress : Not at all  Social Connections: Moderately Isolated   Frequency of Communication with Friends and Family: More than three times a week   Frequency of Social Gatherings with Friends and Family: Once a week   Attends Religious Services: More than 4 times per year   Active Member of [employer] or Organizations: No   Attends Archivist Meetings: Never   Marital Status: Widowed    Tobacco Counseling Counseling given: Not Answered   Clinical  Intake:  Pre-visit preparation completed: Yes  Pain : No/denies pain     Nutritional Risks: None Diabetes: Yes CBG done?: No Did pt. bring in CBG monitor from home?: No  How often do you need to have someone help you when you read instructions, pamphlets, or other written materials from your doctor or pharmacy?: 1 - Never  Diabetic?yes Nutrition Risk Assessment:  Has the patient had any N/V/D within the last 2 months?  No  Does the patient have any non-healing wounds?  No  Has the patient had any unintentional weight loss or weight gain?  No   Diabetes:  Is the patient diabetic?  Yes  If diabetic, was a CBG obtained today?  No  Did the patient bring in their glucometer from home?  No  How often do you monitor your CBG's? Three times per day.   Financial Strains and Diabetes Management:  Are you having any financial strains with the device, your supplies or your medication? No .  Does the patient want to be seen by Chronic Care Management for management of their diabetes?  No  Would the patient like to be referred to a Nutritionist or for Diabetic Management?  No   Diabetic Exams:  Diabetic Eye Exam: Completed 09/15/19. Pt has been advised about the importance in completing this exam.   Diabetic Foot Exam: Completed 06/06/20. Pt has been advised about the importance in completing this exam. Interpreter Needed?: No  Information entered by :: Kirke Shaggy, LPN   Activities of Daily  Living No flowsheet data found.  Patient Care Team: Gwyneth Sprout, FNP as PCP - General (Family Medicine) [place], [place] (Optometry) Gillis Santa, MD as Consulting Physician (Pain Medicine) Neldon Labella, RN as Case Manager Germaine Pomfret, [facility] (Pharmacist)  Indicate any recent Medical Services you may have received from other than [facility] providers in the past year (date may be approximate).     Assessment:   This is a routine wellness examination for Jer[place].  Hearing/Vision screen No results found.  Dietary issues and exercise activities discussed:     Goals Addressed             This Visit's Progress    DIET - EAT MORE FRUITS AND VEGETABLES         Depression Screen PHQ 2/9 Scores 05/09/2021 12/11/2020 05/17/2020 05/03/2020 02/11/2020 11/04/2018 03/18/2018  PHQ - 2 Score 0 0 0 0 0 0 0  PHQ- 9 Score - - - - 0 - -    Fall Risk Fall Risk  05/09/2021 12/20/2020 12/11/2020 05/17/2020 05/03/2020  Falls in the past year? 1 0 0 1 1  Comment - - - x2 in the month in december -  Number falls in past yr: 0 - 0 1 0  Comment - - - - -  Injury with Fall? 0 - - 1 0  Comment - - - - -  Risk for fall due to : History of fall(s) - Orthopedic patient;Medication side effect;Other (Comment) History of fall(s);Impaired balance/gait Orthopedic patient  Risk for fall due to: Comment - - Patient denies falls but reports frequent injections r/t joint pain - -  Follow up Falls prevention discussed - Falls prevention discussed - Falls prevention discussed    FALL RISK PREVENTION PERTAINING TO THE HOME:  Any stairs in or around the home? Yes  If so, are there any without handrails? No  Home free of loose throw rugs in walkways, pet beds, electrical cords, etc?  Yes  Adequate lighting in your home to reduce risk of falls? Yes   ASSISTIVE DEVICES UTILIZED TO PREVENT FALLS:  Life alert? No  Use of a cane, walker or w/c? No  Grab bars in the bathroom? No  Shower chair or bench in  shower? No  Elevated toilet seat or a handicapped toilet? No     Cognitive Function:Normal cognitive status assessed by direct observation by this Nurse Health Advisor. No abnormalities found.       6CIT Screen 10/24/2016  What Year? 0 points  What month? 0 points  What time? 0 points  Count back from 20 0 points  Months in reverse 0 points  Repeat phrase 6 points  Total Score 6    Immunizations Immunization History  Administered Date(s) Administered   Influenza Split 05/26/2007   Influenza,inj,Quad PF,6+ Mos 05/31/2016, 05/19/2017, 01/30/2018, 02/11/2020   PFIZER(Purple Top)SARS-COV-2 Vaccination 08/05/2019, 08/31/2019   Pneumococcal Polysaccharide-23 01/30/2018    TDAP status: Due, Education has been provided regarding the importance of this vaccine. Advised may receive this vaccine at local pharmacy or Health Dept. Aware to provide a copy of the vaccination record if obtained from local pharmacy or Health Dept. Verbalized acceptance and understanding.  Flu Vaccine status: Due, Education has been provided regarding the importance of this vaccine. Advised may receive this vaccine at local pharmacy or Health Dept. Aware to provide a copy of the vaccination record if obtained from local pharmacy or Health Dept. Verbalized acceptance and understanding.  Pneumococcal vaccine status: Up to date  Covid-19 vaccine status: Completed vaccines  Qualifies for Shingles Vaccine? Yes   Zostavax completed No   Shingrix Completed?: No.    Education has been provided regarding the importance of this vaccine. Patient has been advised to call insurance company to determine out of pocket expense if they have not yet received this vaccine. Advised may also receive vaccine at local pharmacy or Health Dept. Verbalized acceptance and understanding.  Screening Tests Health Maintenance  Topic Date Due   TETANUS/TDAP  Never done   Zoster Vaccines- Shingrix (1 of 2) Never done   Pneumococcal Vaccine  21-24 Years old (2 - PCV) 01/31/2019   COVID-19 Vaccine (3 - Pfizer risk series) 09/28/2019   OPHTHALMOLOGY EXAM  09/14/2020   INFLUENZA VACCINE  12/11/2020   URINE MICROALBUMIN  06/06/2021   FOOT EXAM  06/06/2021   MAMMOGRAM  07/28/2021   HEMOGLOBIN A1C  07/29/2021   COLONOSCOPY (Pts 45-65yr Insurance coverage will need to be confirmed)  08/19/2024   Hepatitis C Screening  Completed   HIV Screening  Completed   HPV VACCINES  Aged Out    Health Maintenance  Health Maintenance Due  Topic Date Due   TETANUS/TDAP  Never done   Zoster Vaccines- Shingrix (1 of 2) Never done   Pneumococcal Vaccine 141640Years old (2 - PCV) 01/31/2019   COVID-19 Vaccine (3 - Pfizer risk series) 09/28/2019   OPHTHALMOLOGY EXAM  09/14/2020   INFLUENZA VACCINE  12/11/2020   URINE MICROALBUMIN  06/06/2021    Colorectal cancer screening: Type of screening: Cologuard. Completed 08/20/19. Repeat every 3 years  Mammogram status: Completed 07/29/19. Repeat every year   Lung Cancer Screening: (Low Dose CT Chest recommended if Age 139-80years, 30 pack-year currently smoking OR have quit w/in 15years.) does not qualify.    Additional Screening:  Hepatitis C Screening: does qualify; Completed 10/24/16  Vision Screening: Recommended annual ophthalmology exams for early detection of glaucoma and other disorders of the eye.  Is the patient up to date with their annual eye exam?  Yes  Who is the provider or what is the name of the office in which the patient attends annual eye exams? [facility] If pt is not established with a provider, would they like to be referred to a provider to establish care? No .   Dental Screening: Recommended annual dental exams for proper oral hygiene  Community Resource Referral / Chronic Care Management: CRR required this visit?  No   CCM required this visit?  No      Plan:     I have personally reviewed and noted the following in the patients chart:   Medical and  social history Use of alcohol, tobacco or illicit drugs  Current medications and supplements including opioid prescriptions.  Functional ability and status Nutritional status Physical activity Advanced directives List of other physicians Hospitalizations, surgeries, and ER visits in previous 12 months Vitals Screenings to include cognitive, depression, and falls Referrals and appointments  In addition, I have reviewed and discussed with patient certain preventive protocols, quality metrics, and best practice recommendations. A written personalized care plan for preventive services as well as general preventive health recommendations were provided to patient.     Dionisio David, LPN   [id number]   Nurse Notes: none

## 2021-05-10 ENCOUNTER — Ambulatory Visit: Payer: Self-pay | Admitting: *Deleted

## 2021-05-10 NOTE — Telephone Encounter (Signed)
Summary: diarrhea and headache   Pt having diarrhea and headache and feels she may have gotten a virus. Pt requests call back from nurse to discuss and advise of medications. No appt available until 05/15/21.

## 2021-05-10 NOTE — Telephone Encounter (Signed)
°  Chief Complaint: Diarrhea Symptoms: 5 episodes today, mild headache, nausea, no vomiting.  Frequency: Onset this AM Pertinent Negatives: Patient denies abdominal pain, blood in stool, signs of dehydration.  Disposition: [] ED /[] Urgent Care (no appt availability in office) / [] Appointment(In office/virtual)/ []  [facility]/ [x] Home Care/ [] Refused Recommended Disposition /[] [facility]/ []  Follow-up with PCP Additional Notes: HStates grandchildren and daughter had similar symptoms last few days, close household contact. Home care advise given. Pt did complete course of antibiotics for UTI last week.      Reason for Disposition  [1] Recent antibiotic therapy (i.e., within last 2 months) AND [2] diarrhea present > 3 days since antibiotic was stopped  MILD-MODERATE diarrhea (e.g., 1-6 times / day more than normal)  Answer Assessment - Initial Assessment Questions 1. DIARRHEA SEVERITY: "How bad is the diarrhea?" "How many more stools have you had in the past 24 hours than normal?"    - NO DIARRHEA (SCALE 0)   - MILD (SCALE 1-3): Few loose or mushy BMs; increase of 1-3 stools over normal daily number of stools; mild increase in ostomy output.   -  MODERATE (SCALE 4-7): Increase of 4-6 stools daily over normal; moderate increase in ostomy output. * SEVERE (SCALE 8-10; OR 'WORST POSSIBLE'): Increase of 7 or more stools daily over normal; moderate increase in ostomy output; incontinence.     5 times this am 2. ONSET: "When did the diarrhea begin?"      This AM 3. BM CONSISTENCY: "How loose or watery is the diarrhea?"      watery 4. VOMITING: "Are you also vomiting?" If Yes, ask: "How many times in the past 24 hours?"      No, just nausea 5. ABDOMINAL PAIN: "Are you having any abdominal pain?" If Yes, ask: "What does it feel like?" (e.g., crampy, dull, intermittent, constant)      Crampy like knots 6. ABDOMINAL PAIN SEVERITY: If present, ask: "How bad is the pain?"   (e.g., Scale 1-10; mild, moderate, or severe)   - MILD (1-3): doesn't interfere with normal activities, abdomen soft and not tender to touch    - MODERATE (4-7): interferes with normal activities or awakens from sleep, abdomen tender to touch    - SEVERE (8-10): excruciating pain, doubled over, unable to do any normal activities       Before BMs 7. ORAL INTAKE: If vomiting, "Have you been able to drink liquids?" "How much liquids have you had in the past 24 hours?"      8. HYDRATION: "Any signs of dehydration?" (e.g., dry mouth [not just dry lips], too weak to stand, dizziness, new weight loss) "When did you last urinate?"Staying hydrated      10. ANTIBIOTIC USE: "Are you taking antibiotics now or have you taken antibiotics in the past 2 months?"       Yes took last antibiotic last week 11. OTHER SYMPTOMS: "Do you have any other symptoms?" (e.g., fever, blood in stool)       Headache. Little achy!!  Protocols used: Diarrhea-A-AH

## 2021-05-11 ENCOUNTER — Encounter: Payer: Self-pay | Admitting: Family Medicine

## 2021-05-11 NOTE — Telephone Encounter (Signed)
Attempted call, NA, LM on VM. advised to go to UC due to no appt available today. And to please call us back with any other questions or concerns.

## 2021-05-12 DIAGNOSIS — E119 Type 2 diabetes mellitus without complications: Secondary | ICD-10-CM | POA: Diagnosis not present

## 2021-05-12 DIAGNOSIS — E7800 Pure hypercholesterolemia, unspecified: Secondary | ICD-10-CM | POA: Diagnosis not present

## 2021-05-29 ENCOUNTER — Telehealth: Payer: Self-pay

## 2021-05-29 NOTE — Progress Notes (Signed)
Chronic Care Management Pharmacy Assistant   Name: Emily Stokes  MRN: [id number] DOB: 12/18/1960  Reason for Encounter: Medication Review/Medication Coordination   Recent office visits:  None ID  Recent consult visits:  None ID  Hospital visits:  None in previous 6 months  Medications: Outpatient Encounter Medications as of 05/29/2021  Medication Sig   acetaminophen (TYLENOL) 500 MG tablet Take 500 mg by mouth every 6 (six) hours as needed.   amLODipine (NORVASC) 10 MG tablet Take 1 tablet (10 mg total) by mouth daily.   aspirin 81 MG EC tablet Take 1 tablet (81 mg total) by mouth daily. Swallow whole.   atorvastatin (LIPITOR) 20 MG tablet Take 1 tablet (20 mg total) by mouth daily. Please schedule an office visit before anymore refills.   Blood Glucose Monitoring Suppl (ACCU-CHEK GUIDE ME) w/Device KIT    Dulaglutide (TRULICITY) 1.5 BW/3.8LH SOPN Inject 1.5 mg into the skin once a week.   gabapentin (NEURONTIN) 300 MG capsule Take 1 capsule (300 mg total) by mouth 3 (three) times daily.   Ibuprofen-diphenhydrAMINE Cit (IBUPROFEN PM PO) Take by mouth at bedtime as needed.   Lancet Devices (EASY MINI EJECT LANCING DEVICE) MISC    Lancets (ONETOUCH DELICA PLUS [id number]) MISC USE AS DIRECTED TWICE DAILY IN THE MORNING AND AT BEDTIME   metFORMIN (GLUCOPHAGE) 500 MG tablet Take 2 tablets (1,000 mg total) by mouth 2 (two) times daily with a meal.   metoprolol tartrate (LOPRESSOR) 25 MG tablet Take 1 tablet (25 mg total) by mouth 2 (two) times daily.   No facility-administered encounter medications on file as of 05/29/2021.   Care Gaps: Tetanus/TDAP Zoster Vaccine PNA Vaccine COVID-19 Booster 3 Diabetic Eye Exam Influenza Vaccine A1C >  9  Star Rating Drugs: Trulicity 1. 5 mg  last filled on 05/08/2021 for a 30-Day Supply with [facility] Atorvastatin 20 mg last filled on 05/03/2021 for a 30-Day Supply with [facility] Metformin 500 mg  last filled on  05/03/2021 for a 30-Day Supply with [facility]   BP Readings from Last 3 Encounters:  04/19/21 122/79  01/29/21 138/87  12/20/20 109/74    Lab Results  Component Value Date   HGBA1C 10.9 (A) 01/29/2021     Patient obtains medications through Adherence Packaging  30 Days   Last adherence delivery included:  Amlodipine 10 mg 1 tablet daily (Breakfast) Metformin 500 mg 2 tablets twice daily (Breakfast, Bedtime) Aspirin 81 mg 1 tablet daily (Breakfast) Metoprolol Tartrate 25 mg 1 tablet twice daily (Breakfast, Bedtime) Atorvastatin 20 mg 1 tablet daily (Breakfast) Trulicity Inject 1.57 mg into the skin once a week  Patient declined medications last month: Gabapentin 300 mg 1 capsule twice daily  Patient is due for next adherence delivery on: 06/08/2021 (Friday) [facility].  I called patient and reviewed medications and coordinated delivery, I had to leave several voice mails patient never returned my call  This delivery to include: Gabapentin 300 mg 1 capsule three times daily (Breakfast, Lunch, Evening Meals) Metformin 500 mg 2 tablets twice daily (Breakfast, Bedtime) Aspirin 81 mg 1 tablet daily (Breakfast) Metoprolol Tartrate 25 mg 1 tablet twice daily (Breakfast, Bedtime) Amlodipine 5 mg 2 tablets daily (Breakfast) Atorvastatin 20 mg 1 tablet daily (Breakfast) Trulicity Inject 1.5 mg into the skin once a week   Patient declined the following medications: Nothing declined as I did not speak with the patient  Patient needs refills for: No medications need refills at this time  Could not  confirmed delivery date of 06/08/2021 [facility].  Unable to reach patient to completed Medication Coordination form. Form was completed based on last month delivery. [facility] will contact patient to confirm delivery.Junius Argyle, CPP was notified I was unable to reach patient. I left several voice messages requesting the patient to return my call..  Patient has a telephone  appointment with Junius Argyle, CPP on 06/05/2021 _0   01/17 LVM requesting patient to return my call 01/20 LVM requesting patient to return my call  [name], [place] Pharmacist Assistant Phone: [phone]

## 2021-06-04 ENCOUNTER — Telehealth: Payer: Self-pay

## 2021-06-04 NOTE — Progress Notes (Signed)
° ° °  Chronic Care Management Pharmacy Assistant   Name: Emily Stokes  MRN: [id number] DOB: 1961-01-05  Patient called to be reminded of her telephone appointment with Junius Argyle, CPP on 06/05/2021 @ 0830.  Patient aware of appointment date, time, and type of appointment (either telephone or in person). Patient aware to have/bring all medications, supplements, blood pressure and/or blood sugar logs to visit.  Questions: Have you had any recent office visit or specialist visit outside of [facility]? Nothing at this time  Are there any concerns you would like to discuss during your office visit? Nothing at this time  If patient has any PAP medications ask if they are having any problems getting their PAP medication or refill? Patient never picked up PAP for Trulicity  Star Rating Drug: Trulicity 1.5 mg last filled on 05/08/2021  for a 28-Day supply with [facility] Atorvastatin 20 mg last filled on 05/03/2021 for a 30-Day supply with [facility] Metformin 500 mg last filled on 05/03/2021 for a 30-day supply with [facility]   Any gaps in medications fill history? There is a possible Gap unless Epic is not updating correctly  Care Gaps: Tetanus TDAP Zoster Vaccines COVID-19 Vaccine Booster 3 Diabetic Eye Exam Influenza Vaccine A1C> [place], CPA/CMA Clinical Pharmacist Assistant Phone: [phone]

## 2021-06-05 ENCOUNTER — Telehealth: Payer: Self-pay

## 2021-06-05 ENCOUNTER — Ambulatory Visit (INDEPENDENT_AMBULATORY_CARE_PROVIDER_SITE_OTHER): Payer: Medicare HMO

## 2021-06-05 DIAGNOSIS — E1165 Type 2 diabetes mellitus with hyperglycemia: Secondary | ICD-10-CM

## 2021-06-05 DIAGNOSIS — I10 Essential (primary) hypertension: Secondary | ICD-10-CM

## 2021-06-05 MED ORDER — TRULICITY 3 MG/0.5ML ~~LOC~~ SOAJ: 3.0000 mg | SUBCUTANEOUS | 3 refills | Status: DC

## 2021-06-05 NOTE — Progress Notes (Signed)
° ° °  Chronic Care Management Pharmacy Assistant   Name: Emily Stokes  MRN: [id number] DOB: 09/15/1960  Medication Coordination for Trulicity  I received a message from Junius Argyle, CPP requesting I give [facility] a call to see if they have Trulicity 3 mg in stock as [facility] have indicated they do not have it at this time. They advised CPP that for them this medication is on back order.  I contacted [facility], and spoke with the representative who advised they do not have it in stock at this time either. I sent the update to CPP to see what he would like for me to do.  Per CPP he will keep her on the 1.5 mg until the 3 mg comes back in stock  Medications: Outpatient Encounter Medications as of 06/05/2021  Medication Sig   acetaminophen (TYLENOL) 500 MG tablet Take 500 mg by mouth every 6 (six) hours as needed.   amLODipine (NORVASC) 10 MG tablet Take 1 tablet (10 mg total) by mouth daily.   aspirin 81 MG EC tablet Take 1 tablet (81 mg total) by mouth daily. Swallow whole.   atorvastatin (LIPITOR) 20 MG tablet Take 1 tablet (20 mg total) by mouth daily. Please schedule an office visit before anymore refills.   Blood Glucose Monitoring Suppl (ACCU-CHEK GUIDE ME) w/Device KIT    Dulaglutide (TRULICITY) 3 UX/3.2TF SOPN Inject 3 mg as directed once a week.   gabapentin (NEURONTIN) 300 MG capsule Take 1 capsule (300 mg total) by mouth 3 (three) times daily.   Ibuprofen-diphenhydrAMINE Cit (IBUPROFEN PM PO) Take by mouth at bedtime as needed.   Lancet Devices (EASY MINI EJECT LANCING DEVICE) MISC    Lancets (ONETOUCH DELICA PLUS [id number]) MISC USE AS DIRECTED TWICE DAILY IN THE MORNING AND AT BEDTIME   metFORMIN (GLUCOPHAGE) 500 MG tablet Take 2 tablets (1,000 mg total) by mouth 2 (two) times daily with a meal.   metoprolol tartrate (LOPRESSOR) 25 MG tablet Take 1 tablet (25 mg total) by mouth 2 (two) times daily.   No facility-administered encounter medications on file as of 06/05/2021.     [name], CPA/CMA Clinical Pharmacist Assistant Phone: [phone]

## 2021-06-05 NOTE — Progress Notes (Signed)
Chronic Care Management Pharmacy Note  06/05/2021 Name:  Emily Stokes MRN:  [id number] DOB:  [birth year]  Summary: Patient presents for CCM follow-up. She has yet to follow-up with her PCP. Blood sugars are improved at home, although she still has days with elevated readings. Tolerating Trulicity well.   Recommendations/Changes made from today's visit: -Increase Trulicity to 3 mg weekly.   -Patient instructed to schedule PCP follow-up. Recheck at PCP follow-up:  A1c Fasting Lipid Panel  Vitamin b12   CBC CMP  Plan: CPP follow-up in one month  Subjective: Emily Stokes is an 61 y.o. year old female who is a primary patient of Jacky Kindle, FNP.  The CCM team was consulted for assistance with disease management and care coordination needs.    Engaged with patient by telephone for follow up visit in response to provider referral for pharmacy case management and/or care coordination services.   Consent to Services:  The patient was given information about Chronic Care Management services, agreed to services, and gave verbal consent prior to initiation of services.  Please see initial visit note for detailed documentation.   Patient Care Team: Jacky Kindle, FNP as PCP - General (Family Medicine) Pa, [facility] (Optometry) Edward Jolly, MD as Consulting Physician (Pain Medicine) Juanell Fairly, RN as Case Manager Gaspar Cola, [facility] (Pharmacist)  Recent office visits: 04/19/21: Patient presented to [facility], PA-C for UTI. Macrobid.  01/29/2021 Mila Merry, MD (PCP Office Visit) for Urinary Frequency- Started: Glipizide 2.5 mg daily, Lab order placed, patient instructed to follow-up in 2 months   12/04/2020 Dortha Kern, PA-C (PCP Office Visit) for Follow-up- No medication changes noted, POCT Urinalysis dipstick order placed, no follow-up noted.  Recent consult visits: 01/17/2021 Edward Jolly, MD (Pain Management Telemedicine) for Follow-up- No  medication changes noted, no orders placed, no follow-up noted   12/20/2020 Edward Jolly, MD (Pain Management) for Hip Pain- Procedure Office Visit for Therapeutic InterLaminar Epidural Steroid Injection- No medication changes noted, Patient instructed to follow-up in 4 weeks.    12/14/2020 Edward Jolly, MD (Pain Management) for Pain- Stopped: Benzonatate 100 mg due to patient not taking, no orders placed, patient instructed to follow-up in 6 days  Hospital visits: Medication Reconciliation was completed by comparing discharge summary, patients EMR and Pharmacy list, and upon discussion with patient.  Objective:  Lab Results  Component Value Date   CREATININE 1.10 (H) 06/06/2020   BUN 12 06/06/2020   GFRNONAA 55 (L) 06/06/2020   GFRAA 63 06/06/2020   NA 140 06/06/2020   K 4.3 06/06/2020   CALCIUM 9.4 06/06/2020   CO2 19 (L) 06/06/2020   GLUCOSE 183 (H) 06/06/2020    Lab Results  Component Value Date/Time   HGBA1C 10.9 (A) 01/29/2021 09:26 AM   HGBA1C 14.9 (H) 06/06/2020 10:44 AM   HGBA1C 7.9 (H) 07/28/2019 08:25 AM   MICROALBUR 50 06/06/2020 10:48 AM   MICROALBUR 20 01/30/2018 04:54 PM    Last diabetic Eye exam:  Lab Results  Component Value Date/Time   HMDIABEYEEXA No Retinopathy 09/15/2019 12:00 AM    Last diabetic Foot exam: No results found for: HMDIABFOOTEX   Lab Results  Component Value Date   CHOL 163 07/28/2019   HDL 40 07/28/2019   LDLCALC 107 (H) 07/28/2019   TRIG 82 07/28/2019   CHOLHDL 5.3 (H) 04/20/2019    Hepatic Function Latest Ref Rng & Units 06/06/2020 07/28/2019 04/20/2019  Total Protein 6.0 - 8.5 g/dL 7.4 7.4 7.2  Albumin 3.8 - 4.9 g/dL 4.0 4.4 4.1  AST 0 - 40 IU/L $Remov'31 17 14  'XtJiPW$ ALT 0 - 32 IU/L $Remov'25 16 15  'vRYqog$ Alk Phosphatase 44 - 121 IU/L 116 114 95  Total Bilirubin 0.0 - 1.2 mg/dL 0.4 0.4 0.2    Lab Results  Component Value Date/Time   TSH 1.500 07/28/2019 08:25 AM   TSH 1.600 11/06/2018 11:10 AM    CBC Latest Ref Rng & Units 07/28/2019 04/20/2019  11/06/2018  WBC 3.4 - 10.8 x10E3/uL 6.0 5.3 5.7  Hemoglobin 11.1 - 15.9 g/dL 11.7 11.1 11.6  Hematocrit 34.0 - 46.6 % 35.1 35.6 34.3  Platelets 150 - 450 x10E3/uL 374 396 358    No results found for: VD25OH  Clinical ASCVD: No  The 10-year ASCVD risk score (Arnett DK, et al., 2019) is: 14.2%   Values used to calculate the score:     Age: 61 years     Sex: Female     Is Non-Hispanic African American: Yes     Diabetic: Yes     Tobacco smoker: No     Systolic Blood Pressure: 937 mmHg     Is BP treated: Yes     HDL Cholesterol: 40 mg/dL     Total Cholesterol: 163 mg/dL    Depression screen [facility] 2/9 05/09/2021 12/11/2020 05/17/2020  Decreased Interest 0 0 0  Down, Depressed, Hopeless 0 0 0  PHQ - 2 Score 0 0 0  Altered sleeping - - -  Tired, decreased energy - - -  Change in appetite - - -  Feeling bad or failure about yourself  - - -  Trouble concentrating - - -  Moving slowly or fidgety/restless - - -  Suicidal thoughts - - -  PHQ-9 Score - - -  Difficult doing work/chores - - -    Social History   Tobacco Use  Smoking Status Never  Smokeless Tobacco Never   BP Readings from Last 3 Encounters:  04/19/21 122/79  01/29/21 138/87  12/20/20 109/74   Pulse Readings from Last 3 Encounters:  04/19/21 82  01/29/21 79  12/20/20 72   Wt Readings from Last 3 Encounters:  04/19/21 224 lb (101.6 kg)  01/29/21 223 lb (101.2 kg)  12/20/20 238 lb (108 kg)   BMI Readings from Last 3 Encounters:  04/19/21 33.08 kg/m  01/29/21 32.93 kg/m  12/20/20 35.15 kg/m    Assessment/Interventions: Review of patient past medical history, allergies, medications, health status, including review of consultants reports, laboratory and other test data, was performed as part of comprehensive evaluation and provision of chronic care management services.   SDOH:  (Social Determinants of Health) assessments and interventions performed: Yes SDOH Interventions    Flowsheet Row Most Recent Value   SDOH Interventions   Financial Strain Interventions Other (Comment)  [PAP]        SDOH Screenings   Alcohol Screen: Low Risk    Last Alcohol Screening Score (AUDIT): 0  Depression (PHQ2-9): Low Risk    PHQ-2 Score: 0  Financial Resource Strain: Medium Risk   Difficulty of Paying Living Expenses: Somewhat hard  Food Insecurity: No Food Insecurity   Worried About Charity fundraiser in the Last Year: Never true   Ran Out of Food in the Last Year: Never true  Housing: Low Risk    Last Housing Risk Score: 0  Physical Activity: Inactive   Days of Exercise per Week: 0 days   Minutes of Exercise per Session: 0  min  Social Connections: Moderately Isolated   Frequency of Communication with Friends and Family: More than three times a week   Frequency of Social Gatherings with Friends and Family: Once a week   Attends Religious Services: More than 4 times per year   Active Member of [employer] or Organizations: No   Attends Archivist Meetings: Never   Marital Status: Widowed  Stress: No Stress Concern Present   Feeling of Stress : Not at all  Tobacco Use: Low Risk    Smoking Tobacco Use: Never   Smokeless Tobacco Use: Never   Passive Exposure: Not on file  Transportation Needs: No Transportation Needs   Lack of Transportation (Medical): No   Lack of Transportation (Non-Medical): No    CCM Care Plan  Allergies  Allergen Reactions   Bactrim [Sulfamethoxazole-Trimethoprim] Swelling    Medications Reviewed Today     Reviewed by Dionisio David, LPN (Licensed Practical Nurse) on 05/09/21 at 1338  Med List Status: <None>   Medication Order Taking? Sig Documenting Provider Last Dose Status Informant  acetaminophen (TYLENOL) 500 MG tablet [id number] Yes Take 500 mg by mouth every 6 (six) hours as needed. [provider] Taking Active   amLODipine (NORVASC) 10 MG tablet [id number] Yes Take 1 tablet (10 mg total) by mouth daily. Birdie Sons, MD Taking Active    aspirin 81 MG EC tablet [id number] Yes Take 1 tablet (81 mg total) by mouth daily. Swallow whole. Birdie Sons, MD Taking Active   atorvastatin (LIPITOR) 20 MG tablet [id number] Yes Take 1 tablet (20 mg total) by mouth daily. Please schedule an office visit before anymore refills. Birdie Sons, MD Taking Active   Blood Glucose Monitoring Suppl (ACCU-CHEK GUIDE ME) w/Device Drucie Opitz [id number] Yes  [provider] Taking Active   Dulaglutide (TRULICITY) 1.5 HD/6.2IW SOPN [id number] Yes Inject 1.5 mg into the skin once a week. Birdie Sons, MD Taking Active   gabapentin (NEURONTIN) 300 MG capsule [id number] Yes Take 1 capsule (300 mg total) by mouth 3 (three) times daily. Gillis Santa, MD Taking Active   Ibuprofen-diphenhydrAMINE Cit (IBUPROFEN PM PO) [id number] Yes Take by mouth at bedtime as needed. [provider] Taking Active   Lancet Devices (EASY MINI EJECT LANCING DEVICE) MISC [id number] Yes  [provider] Taking Active   Lancets [facility] DELICA PLUS [id number]) [place] [id number] Yes USE AS DIRECTED TWICE DAILY IN THE MORNING AND AT BEDTIME Chrismon, [name], PA-C Taking Active   metFORMIN (GLUCOPHAGE) 500 MG tablet [id number] Yes Take 2 tablets (1,000 mg total) by mouth 2 (two) times daily with a meal. Birdie Sons, MD Taking Active   metoprolol tartrate (LOPRESSOR) 25 MG tablet [id number] Yes Take 1 tablet (25 mg total) by mouth 2 (two) times daily. Birdie Sons, MD Taking Active   Med List Note Landis Martins, RN 07/13/20 6503): UDS 10-21-19 MR 09/11/2020 Opiod contracts signed 02/12/18 07-13-20 PA request for Tramadol submitted. Key [id number]  dw            Patient Active Problem List   Diagnosis Date Noted   Acute cystitis without hematuria 04/19/2021   Spinal stenosis, lumbar region, with neurogenic claudication 04/27/2020   Personal history of colonic polyps    Polyp of transverse colon    Lumbar facet arthropathy 03/18/2018   Lumbar  spondylosis 03/18/2018   Encounter for screening colonoscopy    Internal hemorrhoids    Benign neoplasm of descending colon    Benign neoplasm  of cecum    Type 2 diabetes mellitus with hyperglycemia, without long-term current use of insulin ([place]) 07/31/2017   Recurrent abdominal hernia without obstruction or gangrene 07/24/2017   Cellulitis of face 05/30/2016   Chronic pain syndrome 05/30/2016   Nasal septal abscess 05/30/2016   Pain in elbow 02/05/2016   Lumbar degenerative disc disease 09/14/2015   Facet syndrome, lumbar 09/14/2015   Lumbar radiculopathy 09/14/2015   Sacroiliac joint dysfunction 09/14/2015   History of chicken pox 02/02/2015   Leg pain 02/02/2015   Hypercholesteremia 02/02/2015   Blood glucose elevated 02/02/2015   Pain in soft tissues of limb 02/02/2015   Nonspecific ST-T changes 02/02/2015   Awareness of heartbeats 02/02/2015   Pain in extremity at multiple sites 02/02/2015   Snores 02/02/2015   Hernia of anterior abdominal wall 02/24/2012   Pain in shoulder 12/05/2011   Adnexal mass 10/30/2011   Pelvic cyst 10/30/2011   Palpitations 03/14/2011   HTN (hypertension) 03/14/2011   Abnormal finding on EKG 03/14/2011   Obesity 03/14/2011   Infected surgical wound 09/15/2008   Urinary system disease 08/10/2008   Benign essential HTN 07/12/2008   Arthralgia of lower leg 03/28/2008   Anemia, iron deficiency 06/17/2007   Calculus of kidney 05/26/2007    Immunization History  Administered Date(s) Administered   Influenza Split 05/26/2007   Influenza,inj,Quad PF,6+ Mos 05/31/2016, 05/19/2017, 01/30/2018, 02/11/2020   PFIZER(Purple Top)SARS-COV-2 Vaccination 08/05/2019, 08/31/2019   Pneumococcal Polysaccharide-23 01/30/2018    Conditions to be addressed/monitored:  Hypertension, Hyperlipidemia, Diabetes, and Chronic Pain  Care Plan : General Pharmacy (Adult)  Updates made by Germaine Pomfret, RPH since 06/05/2021 12:00 AM     Problem: Hypertension,  Hyperlipidemia, Diabetes, and Chronic Pain   Priority: High     Long-Range Goal: Patient-Specific Goal   Start Date: 03/27/2021  Expected End Date: 03/27/2022  This Visit's Progress: On track  Recent Progress: On track  Priority: High  Note:   Current Barriers:  Unable to achieve control of diabetes   Pharmacist Clinical Goal(s):  Patient will maintain control of diabetes as evidenced by A1c less than 8%  through collaboration with PharmD and provider.   Interventions: 1:1 collaboration with Gwyneth Sprout, FNP regarding development and update of comprehensive plan of care as evidenced by provider attestation and co-signature Inter-disciplinary care team collaboration (see longitudinal plan of care) Comprehensive medication review performed; medication list updated in electronic medical record  Hypertension (BP goal <130/80) -Controlled -Current treatment: Amlodipine 10 mg daily: Appropriate, Effective, Safe, Accessible Metoprolol 25 mg twice daily: Query appropriate -Medications previously tried: NA  -Current home readings: Does not monitor at home -Denies hypotensive/hypertensive symptoms -Questionably appropriate use of beta blocker given lack of clear indication. Will address at future visit -Recommended to continue current medication  Hyperlipidemia: (LDL goal < 70) -Uncontrolled -Current treatment: Atorvastatin 20 mg daily  -Current treatment: Aspirin 81 mg daily  -Medications previously tried: NA  -Continue current medications  Diabetes (A1c goal <8%) -Uncontrolled -Diagnosed 2018 -Current medications: Metformin 500 mg 2 tablets twice daily: Appropriate, Query effective Trulicity 1.5 mg weekly on tuesdays: Appropriate, Query effective  -Medications previously tried: NA  -Current home glucose readings:  Fasting: highest 170, 188 (later dinner, biscuit and milk), 144, 135  -Denies hypoglycemic/hyperglycemic symptoms.   -Tolerating Trulicity well, denies  nausea/vomiting. Does notice a decreased appetite and feels she needs to force herself to eat at times. Counseled on importance of eating small meals and snacks and maintaining nutrition intake throughout the day.  -  Current meal patterns:  Breakfast: Eggs + Berniece Salines, often skips.  Lunch: Salad OR sandwich, often skips  Supper: Spaghetti OR sandwich drinks: Mainly water. 1/2 cup of soda ~1x weekly.  -Current exercise: Unable walk due to leg pain.  -INCREASE Trulicity to 3 mg weekly.  -Will resend PAP to patient.   Chronic Pain (Goal: Minimize pain symptoms) -Not ideally controlled -Managed by Dr. Holley Raring -Current treatment  Gabapentin 300 mg twice daily  -Medications previously tried: NA  -Recommended to continue current medication  Patient Goals/Self-Care Activities Patient will:  - check glucose daily before breakfast, document, and provide at future appointments  Follow Up Plan: Telephone follow up appointment with care management team member scheduled for:  07/06/2021 at 8:30 AM      Medication Assistance: None required.  Patient affirms current coverage meets needs.  Compliance/Adherence/Medication fill history: Care Gaps: Zoster Vaccines PNA Vaccine COVID-19 Vaccine Booster 3 Diabetic Eye Exam Influenza Vaccine A1C > 9 (recheck next month)  Star-Rating Drugs: Atorvastatin 20 mg last filled on 05/03/2021 for a 30-Day supply with [facility] Metformin 500 mg  last filled on 05/03/2021 for a 30-Day supply with [facility] Trulicity 1.5 mg  last filled on 05/03/2021 for a 30-Day supply with [facility]   Patient's preferred pharmacy is:  [facility] - [place], [place] - [address] [place] [address] Phone: [phone] Fax: [phone]  [id number] - [address] [place] [address] Phone: [phone] Fax: [phone]  Patient decided  to: Utilize [facility] for medication synchronization, packaging and delivery  Care Plan and Follow Up Patient Decision:  Patient agrees to Care Plan and Follow-up.  Plan: Telephone follow up appointment with care management team member scheduled for:  07/06/2021 at 8:30 AM  Junius Argyle, PharmD, [place], [place] [phone]

## 2021-06-05 NOTE — Patient Instructions (Signed)
Visit Information It was great speaking with you today!  Please let me know if you have any questions about our visit.   Goals Addressed             This Visit's Progress    Monitor and Manage My Blood Sugar-Diabetes Type 2   On track    Timeframe:  Long-Range Goal Priority:  High Start Date: 03/27/2021                            Expected End Date: 03/27/2022                      Follow Up within 30 days   - check blood sugar daily before breakfast - check blood sugar if I feel it is too high or too low - enter blood sugar readings and medication or insulin into daily log - take the blood sugar log to all doctor visits    Why is this important?   Checking your blood sugar at home helps to keep it from getting very high or very low.  Writing the results in a diary or log helps the doctor know how to care for you.  Your blood sugar log should have the time, date and the results.  Also, write down the amount of insulin or other medicine that you take.  Other information, like what you ate, exercise done and how you were feeling, will also be helpful.     Notes:         Patient Care Plan: Diabetes Type 2 (Adult)     Problem Identified: Disease Progression (Diabetes, Type 2)      Long-Range Goal: Disease Progression Prevented or Minimized   Start Date: 12/11/2020  Expected End Date: 04/10/2021  Priority: High  Note:   Objective:  Lab Results  Component Value Date   HGBA1C 14.9 (H) 06/06/2020    Lab Results  Component Value Date   HGBA1C 10.9 (A) 01/29/2021    Current Barriers:  Chronic Disease Management support and educational needs related to Diabetes self-management.  Case Manager Clinical Goal(s):  Over the next 120 days, patient will demonstrate improved adherence to prescribed treatment plan for diabetes self care/management as evidenced by: taking medications as prescribed, daily monitoring and recording CBG's,  adherence to an ADA/ carb modified  diet.   Interventions:  Collaboration with PCP regarding development and update of comprehensive plan of care as evidenced by provider attestation and co-signature Inter-disciplinary care team collaboration (see longitudinal plan of care) Discussed s/sx of hypoglycemia and hyperglycemia along with recommended interventions. Reviewed blood glucose readings. Reports numbers have improved but continues to have high readings in the 250's. She is very eager to achieve better glycemic control but prefers not to use insulin. Reports taking glipizide and metformin as prescribed. Overall A1C has improved. Decreased for 14.9% to 10.9%. Collaborated with the [facility] regarding patient's request to discuss medications for better glycemic control. The Pharmacist will reach out within the next few weeks. Discussed nutritional intake. Reports nutritional intake has improved but still attempting to monitor carb intake. Advised to continue reading nutrition labels and limiting intake of concentrated sugars. Advised to update the team if she is willing to speak with a dietician. Reviewed recommended diabetic exams. Continues to perform daily foot care. Denies significant changes with vision but notes an increased glare when readings. She is due for an eye exam. Advised to contact Optometry  to schedule as soon as possible.   Self-Care/Patient Goals: Self administer medications as prescribed Attend all scheduled provider appointments Monitor blood glucose levels and record readings Engage in low impact activity as tolerated Adhere to prescribed ADA/carb modified Complete outreach with the [facility] Contact Optometry to schedule an updated eye exam Contact clinic with questions and new concerns as needed   Follow Up Plan:  Will follow up in two months    Patient Care Plan: Hypertension (Adult)     Problem Identified: Hypertension (Hypertension)      Long-Range Goal: Hypertension Monitored  Completed 03/13/2021  Start Date: 12/11/2020  Expected End Date: 04/10/2021  Priority: High  Note:    Current Barriers:  Chronic Disease Management support and educational needs related to Hypertension and Dyslipidemia.  Case Manager Clinical Goal(s):  Over the next 120 days, patient will demonstrate improved adherence to prescribed treatment plan as evidenced by taking all medications as prescribed, monitoring and recording blood pressure and adhering to a low sodium/DASH diet.  Interventions:  Inter-disciplinary care team collaboration (see longitudinal plan of care) Reviewed compliance with current treatment plan. Advised to continue taking medications as prescribed. Advised to update her  PCP if unable to tolerate regimen. Currently tolerating statin well. Reviewed established BP parameters and encouraged to monitor and record readings. Advised to continue monitoring sodium intake and attempt to avoid highly processed foods when possible. Reviewed s/sx of heart attack, stroke and worsening symptoms that require immediate medical attention.   Self-Care/Patient Goals: Self administer medications as prescribed Attend all scheduled provider appointments Monitor BP and record readings Adhere to a low sodium/DASH diet Engage in low impact activity as tolerated Contact provider office with questions and new concerns as needed     Patient Care Plan: General Pharmacy (Adult)     Problem Identified: Hypertension, Hyperlipidemia, Diabetes, and Chronic Pain   Priority: High     Long-Range Goal: Patient-Specific Goal   Start Date: 03/27/2021  Expected End Date: 03/27/2022  This Visit's Progress: On track  Recent Progress: On track  Priority: High  Note:   Current Barriers:  Unable to achieve control of diabetes   Pharmacist Clinical Goal(s):  Patient will maintain control of diabetes as evidenced by A1c less than 8%  through collaboration with PharmD and provider.    Interventions: 1:1 collaboration with Gwyneth Sprout, FNP regarding development and update of comprehensive plan of care as evidenced by provider attestation and co-signature Inter-disciplinary care team collaboration (see longitudinal plan of care) Comprehensive medication review performed; medication list updated in electronic medical record  Hypertension (BP goal <130/80) -Controlled -Current treatment: Amlodipine 10 mg daily: Appropriate, Effective, Safe, Accessible Metoprolol 25 mg twice daily: Query appropriate -Medications previously tried: NA  -Current home readings: Does not monitor at home -Denies hypotensive/hypertensive symptoms -Questionably appropriate use of beta blocker given lack of clear indication. Will address at future visit -Recommended to continue current medication  Hyperlipidemia: (LDL goal < 70) -Uncontrolled -Current treatment: Atorvastatin 20 mg daily  -Current treatment: Aspirin 81 mg daily  -Medications previously tried: NA  -Continue current medications  Diabetes (A1c goal <8%) -Uncontrolled -Diagnosed 2018 -Current medications: Metformin 500 mg 2 tablets twice daily: Appropriate, Query effective Trulicity 1.5 mg weekly on tuesdays: Appropriate, Query effective  -Medications previously tried: NA  -Current home glucose readings:  Fasting: highest 170, 188 (later dinner, biscuit and milk), 144, 135  -Denies hypoglycemic/hyperglycemic symptoms.   -Tolerating Trulicity well, denies nausea/vomiting. Does notice a decreased appetite and feels she needs  to force herself to eat at times. Counseled on importance of eating small meals and snacks and maintaining nutrition intake throughout the day.  -Current meal patterns:  Breakfast: Eggs + Berniece Salines, often skips.  Lunch: Salad OR sandwich, often skips  Supper: Spaghetti OR sandwich drinks: Mainly water. 1/2 cup of soda ~1x weekly.  -Current exercise: Unable walk due to leg pain.  -INCREASE Trulicity to  3 mg weekly.  -Will resend PAP to patient.   Chronic Pain (Goal: Minimize pain symptoms) -Not ideally controlled -Managed by Dr. Holley Raring -Current treatment  Gabapentin 300 mg twice daily  -Medications previously tried: NA  -Recommended to continue current medication  Patient Goals/Self-Care Activities Patient will:  - check glucose daily before breakfast, document, and provide at future appointments  Follow Up Plan: Telephone follow up appointment with care management team member scheduled for:  07/06/2021 at 8:30 AM    Patient agreed to services and verbal consent obtained.   Patient verbalizes understanding of instructions and care plan provided today and agrees to view in [place]. Active MyChart status confirmed with patient.    Junius Argyle, PharmD, [place], CPP  Clinical Pharmacist Practitioner  [facility] [phone]

## 2021-06-12 DIAGNOSIS — E1165 Type 2 diabetes mellitus with hyperglycemia: Secondary | ICD-10-CM

## 2021-06-12 DIAGNOSIS — I10 Essential (primary) hypertension: Secondary | ICD-10-CM

## 2021-06-15 ENCOUNTER — Telehealth: Payer: Self-pay | Admitting: Family Medicine

## 2021-06-15 NOTE — Telephone Encounter (Signed)
Emily Stokes from [employer] called in, says needs office notes for pt to receive diabetic shoes

## 2021-06-15 NOTE — Telephone Encounter (Signed)
Contacted company at phone number provided below, and they advised to forward information to fax # [phone], will send over office notes. KW

## 2021-06-21 DIAGNOSIS — E119 Type 2 diabetes mellitus without complications: Secondary | ICD-10-CM | POA: Diagnosis not present

## 2021-06-26 ENCOUNTER — Ambulatory Visit: Payer: Medicare HMO | Admitting: Family Medicine

## 2021-06-26 NOTE — Progress Notes (Unsigned)
Established patient visit   Patient: Emily Stokes   DOB: 12/25/1960   61 y.o. Female  MRN: [id number] Visit Date: 06/26/2021  Today's healthcare provider: Gwyneth Sprout, FNP   No chief complaint on file.  Subjective    HPI  Diabetes Mellitus Type II, Follow-up  Lab Results  Component Value Date   HGBA1C 10.9 (A) 01/29/2021   HGBA1C 14.9 (H) 06/06/2020   HGBA1C 7.9 (H) 07/28/2019   Wt Readings from Last 3 Encounters:  04/19/21 224 lb (101.6 kg)  01/29/21 223 lb (101.2 kg)  12/20/20 238 lb (108 kg)   Last seen for diabetes 5 months ago.  Management since then includes adding glipizide 2.5 mg daily. She reports {excellent/good/fair/poor:19665} compliance with treatment. She {is/is not:21021397} having side effects. {document side effects if present:1} Symptoms: {Yes/No:20286} fatigue {Yes/No:20286} foot ulcerations  {Yes/No:20286} appetite changes {Yes/No:20286} nausea  {Yes/No:20286} paresthesia of the feet  {Yes/No:20286} polydipsia  {Yes/No:20286} polyuria {Yes/No:20286} visual disturbances   {Yes/No:20286} vomiting     Home blood sugar records: {diabetes glucometry results:16657}  Episodes of hypoglycemia? {Yes/No:20286} {enter symptoms and frequency of symptoms if yes:1}   Current insulin regiment: {enter 'none' or type of insulin and number of units taken with each dose of each insulin formulation that the patient is taking:1} Most Recent Eye Exam: *** {Current exercise:16438:::1} {Current diet habits:16563:::1}  Pertinent Labs: Lab Results  Component Value Date   CHOL 163 07/28/2019   HDL 40 07/28/2019   LDLCALC 107 (H) 07/28/2019   TRIG 82 07/28/2019   CHOLHDL 5.3 (H) 04/20/2019   Lab Results  Component Value Date   NA 140 06/06/2020   K 4.3 06/06/2020   CREATININE 1.10 (H) 06/06/2020   GFRNONAA 55 (L) 06/06/2020   MICROALBUR 50 06/06/2020      ---------------------------------------------------------------------------------------------------   Medications: Outpatient Medications Prior to Visit  Medication Sig   acetaminophen (TYLENOL) 500 MG tablet Take 500 mg by mouth every 6 (six) hours as needed.   amLODipine (NORVASC) 10 MG tablet Take 1 tablet (10 mg total) by mouth daily.   aspirin 81 MG EC tablet Take 1 tablet (81 mg total) by mouth daily. Swallow whole.   atorvastatin (LIPITOR) 20 MG tablet Take 1 tablet (20 mg total) by mouth daily. Please schedule an office visit before anymore refills.   Blood Glucose Monitoring Suppl (ACCU-CHEK GUIDE ME) w/Device KIT    Dulaglutide (TRULICITY) 3 OM/8.5TC SOPN Inject 3 mg as directed once a week.   gabapentin (NEURONTIN) 300 MG capsule Take 1 capsule (300 mg total) by mouth 3 (three) times daily.   Ibuprofen-diphenhydrAMINE Cit (IBUPROFEN PM PO) Take by mouth at bedtime as needed.   Lancet Devices (EASY MINI EJECT LANCING DEVICE) MISC    Lancets (ONETOUCH DELICA PLUS [id number]) MISC USE AS DIRECTED TWICE DAILY IN THE MORNING AND AT BEDTIME   metFORMIN (GLUCOPHAGE) 500 MG tablet Take 2 tablets (1,000 mg total) by mouth 2 (two) times daily with a meal.   metoprolol tartrate (LOPRESSOR) 25 MG tablet Take 1 tablet (25 mg total) by mouth 2 (two) times daily.   No facility-administered medications prior to visit.    Review of Systems  {Labs   Heme   Chem   Endocrine   Serology   Results Review (optional):23779}   Objective    There were no vitals taken for this visit. {Show previous vital signs (optional):23777}  Physical Exam  ***  No results found for any visits on 06/26/21.  Assessment &  Plan     ***  No follow-ups on file.      Ar[place] Ponder [place] Oborn,acting as a scribe for Gwyneth Sprout, FNP.,have documented all relevant documentation on the behalf of Gwyneth Sprout, FNP,as directed by  Gwyneth Sprout, FNP while in the presence of Gwyneth Sprout, FNP.  {provider  attestation***:1}   Gwyneth Sprout, [place] [phone] (phone) [phone] (fax)  [place]

## 2021-06-28 ENCOUNTER — Telehealth: Payer: Self-pay

## 2021-06-28 NOTE — Progress Notes (Signed)
Chronic Care Management Pharmacy Assistant   Name: Emily Stokes  MRN: [id number] DOB: 07-Oct-1960  Reason for Encounter: Medication Review/Medication Coordination Call.   Recent office visits:  No recent office visit  Recent consult visits:  No recent consult visit  Hospital visits:  None in previous 6 months  Medications: Outpatient Encounter Medications as of 06/28/2021  Medication Sig   acetaminophen (TYLENOL) 500 MG tablet Take 500 mg by mouth every 6 (six) hours as needed.   amLODipine (NORVASC) 10 MG tablet Take 1 tablet (10 mg total) by mouth daily.   aspirin 81 MG EC tablet Take 1 tablet (81 mg total) by mouth daily. Swallow whole.   atorvastatin (LIPITOR) 20 MG tablet Take 1 tablet (20 mg total) by mouth daily. Please schedule an office visit before anymore refills.   Blood Glucose Monitoring Suppl (ACCU-CHEK GUIDE ME) w/Device KIT    Dulaglutide (TRULICITY) 3 VV/6.1YW SOPN Inject 3 mg as directed once a week.   gabapentin (NEURONTIN) 300 MG capsule Take 1 capsule (300 mg total) by mouth 3 (three) times daily.   Ibuprofen-diphenhydrAMINE Cit (IBUPROFEN PM PO) Take by mouth at bedtime as needed.   Lancet Devices (EASY MINI EJECT LANCING DEVICE) MISC    Lancets (ONETOUCH DELICA PLUS [id number]) MISC USE AS DIRECTED TWICE DAILY IN THE MORNING AND AT BEDTIME   metFORMIN (GLUCOPHAGE) 500 MG tablet Take 2 tablets (1,000 mg total) by mouth 2 (two) times daily with a meal.   metoprolol tartrate (LOPRESSOR) 25 MG tablet Take 1 tablet (25 mg total) by mouth 2 (two) times daily.   No facility-administered encounter medications on file as of 06/28/2021.    Care Gaps: Tetanus/TDAP Zoster Vaccine COVID-19 Booster 3 Diabetic Eye Exam Influenza Vaccine Foot Exam Urine Microalbumin A1C was 10.9% on 01/29/2021   Star Rating Drugs: Trulicity 1. 5 mg  last filled on 06/06/2021 for a 30-Day Supply with [facility] Atorvastatin 20 mg last filled on 06/04/2021 for a 30-Day  Supply with [facility] Metformin 500 mg  last filled on 06/04/2021 for a 30-Day Supply with [facility]  Medications Fill Gaps: None ID  Reviewed chart for medication changes ahead of medication coordination call.  BP Readings from Last 3 Encounters:  04/19/21 122/79  01/29/21 138/87  12/20/20 109/74    Lab Results  Component Value Date   HGBA1C 10.9 (A) 01/29/2021     Patient obtains medications through Adherence Packaging  30 Days   Per [facility] data:  Last adherence delivery included: Gabapentin 300 mg 1 capsule three times daily (Breakfast, Lunch, Evening Meals) Metformin 500 mg 2 tablets twice daily (Breakfast, Bedtime) Aspirin 81 mg 1 tablet daily (Breakfast) Metoprolol Tartrate 25 mg 1 tablet twice daily (Breakfast, Bedtime) Amlodipine 5 mg 2 tablets daily (Breakfast) Atorvastatin 20 mg 1 tablet daily (Breakfast) Trulicity Inject 1.5 mg into the skin once a week  Patient declined medications last month: None ID  Patient is due for next adherence delivery on: 07/10/2021. Called patient and reviewed medications and coordinated delivery.  Unable to reach patient to completed Medication Coordination form. Form was completed based on last month delivery. [facility] will contact patient to confirm delivery.Junius Argyle, CPP was notified I was unable to reach patient  This delivery to include: Gabapentin 300 mg 1 capsule three times daily (Breakfast, Lunch, Evening Meals) Metformin 500 mg 2 tablets twice daily (Breakfast, Bedtime) Aspirin 81 mg 1 tablet daily (Breakfast) Metoprolol Tartrate 25 mg 1 tablet twice daily (Breakfast, Bedtime) Amlodipine  5 mg 2 tablets daily (Breakfast) Atorvastatin 20 mg 1 tablet daily (Breakfast) Trulicity Inject 1.5 mg into the skin once a week   Patient declined the following medications: None ID  Patient needs refills for None ID.  Could not Confirmed delivery date of 07/10/2021 ([facility]).  Telephone follow up appointment with Care management team member scheduled for : 07/06/2021 at 8:30 am.  [place] Pharmacist Assistant [phone]

## 2021-07-04 NOTE — Progress Notes (Signed)
[place] P[place],acting as a scribe for Gwyneth Sprout, FNP.,have documented all relevant documentation on the behalf of Gwyneth Sprout, FNP,as directed by  Gwyneth Sprout, FNP while in the presence of Gwyneth Sprout, FNP.     Established patient visit   Patient: Emily Stokes   DOB: 06/06/1960   61 y.o. Female  MRN: [id number] Visit Date: 07/05/2021  Today's healthcare provider: Gwyneth Sprout, FNP   No chief complaint on file.  Subjective    HPI  Diabetes Mellitus Type II, Follow-up  Lab Results  Component Value Date   HGBA1C 10.9 (A) 01/29/2021   HGBA1C 14.9 (H) 06/06/2020   HGBA1C 7.9 (H) 07/28/2019   Wt Readings from Last 3 Encounters:  07/05/21 224 lb (101.6 kg)  04/19/21 224 lb (101.6 kg)  01/29/21 223 lb (101.2 kg)   Last seen for diabetes 5 months ago.  Management since then includes adding Glipizide on 01/29/21.  Patient had been started on Metformin 6 months prior to that date.. Patient states that since her las visit she was seen by the Pharm D.  He told her to discontinue her Glipizide and he started her on Trulicity. She reports good compliance with treatment. She is not having side effects.  Symptoms: No fatigue No foot ulcerations  No appetite changes No nausea  Yes paresthesia of the feet  No polydipsia  No polyuria Yes visual disturbances   No vomiting     Home blood sugar records:  ranging 120-220.  Episodes of hypoglycemia? No     Current insulin regiment: none Most Recent Eye Exam: patient is due Current exercise: none Current diet habits: regular  Pertinent Labs: Lab Results  Component Value Date   CHOL 163 07/28/2019   HDL 40 07/28/2019   LDLCALC 107 (H) 07/28/2019   TRIG 82 07/28/2019   CHOLHDL 5.3 (H) 04/20/2019   Lab Results  Component Value Date   NA 140 06/06/2020   K 4.3 06/06/2020   CREATININE 1.10 (H) 06/06/2020   GFRNONAA 55 (L) 06/06/2020   MICROALBUR 50 06/06/2020      ---------------------------------------------------------------------------------------------------   Medications: Outpatient Medications Prior to Visit  Medication Sig   acetaminophen (TYLENOL) 500 MG tablet Take 500 mg by mouth every 6 (six) hours as needed.   aspirin 81 MG EC tablet Take 1 tablet (81 mg total) by mouth daily. Swallow whole.   atorvastatin (LIPITOR) 20 MG tablet Take 1 tablet (20 mg total) by mouth daily. Please schedule an office visit before anymore refills.   Blood Glucose Monitoring Suppl (ACCU-CHEK GUIDE ME) w/Device KIT    Dulaglutide (TRULICITY) 3 OV/5.6EP SOPN Inject 3 mg as directed once a week.   gabapentin (NEURONTIN) 300 MG capsule Take 1 capsule (300 mg total) by mouth 3 (three) times daily.   Ibuprofen-diphenhydrAMINE Cit (IBUPROFEN PM PO) Take by mouth at bedtime as needed.   Lancet Devices (EASY MINI EJECT LANCING DEVICE) MISC    Lancets (ONETOUCH DELICA PLUS [id number]) MISC USE AS DIRECTED TWICE DAILY IN THE MORNING AND AT BEDTIME   metFORMIN (GLUCOPHAGE) 500 MG tablet Take 2 tablets (1,000 mg total) by mouth 2 (two) times daily with a meal.   metoprolol tartrate (LOPRESSOR) 25 MG tablet Take 1 tablet (25 mg total) by mouth 2 (two) times daily.   [DISCONTINUED] amLODipine (NORVASC) 10 MG tablet Take 1 tablet (10 mg total) by mouth daily.   No facility-administered medications prior to visit.    Review of Systems  Objective    BP 113/77 (BP Location: Right Arm, Patient Position: Sitting, Cuff Size: Large)    Pulse 80    Temp 98.8 F (37.1 C) (Oral)    Wt 224 lb (101.6 kg)    SpO2 97%    BMI 33.08 kg/m     Physical Exam Vitals and nursing note reviewed.  Constitutional:      General: She is not in acute distress.    Appearance: Normal appearance. She is obese. She is not ill-appearing, toxic-appearing or diaphoretic.  HENT:     Head: Normocephalic and atraumatic.  Cardiovascular:     Rate and Rhythm: Normal rate and regular rhythm.      Pulses: Normal pulses.     Heart sounds: Normal heart sounds. No murmur heard.   No friction rub. No gallop.  Pulmonary:     Effort: Pulmonary effort is normal. No respiratory distress.     Breath sounds: Normal breath sounds. No stridor. No wheezing, rhonchi or rales.  Chest:     Chest wall: No tenderness.  Abdominal:     General: Bowel sounds are normal.     Palpations: Abdomen is soft.  Musculoskeletal:        General: No swelling, tenderness, deformity or signs of injury. Normal range of motion.     Right lower leg: No edema.     Left lower leg: No edema.  Skin:    General: Skin is warm and dry.     Capillary Refill: Capillary refill takes less than 2 seconds.     Coloration: Skin is not jaundiced or pale.     Findings: No bruising, erythema, lesion or rash.  Neurological:     General: No focal deficit present.     Mental Status: She is alert and oriented to person, place, and time. Mental status is at baseline.     Cranial Nerves: No cranial nerve deficit.     Sensory: No sensory deficit.     Motor: No weakness.     Coordination: Coordination normal.  Psychiatric:        Mood and Affect: Mood normal.        Behavior: Behavior normal.        Thought Content: Thought content normal.        Judgment: Judgment normal.       No results found for any visits on 07/05/21.  Assessment & Plan     Problem List Items Addressed This Visit       Cardiovascular and Mediastinum   Hypertension associated with diabetes (HCC) - Primary    Chronic, stable; reinforced goal of <130/<80 Denies CP Denies SOB Denies DOE No LE Edema noted on exam Continue medication- metop and norvasc Refills provided on norvasc Seek emergent care if you develop CP, chest pain or chest pressure       Relevant Medications   amLODipine (NORVASC) 10 MG tablet     Endocrine   Hyperlipidemia associated with type 2 diabetes mellitus (HCC)    HLD - medications: Lipitor at 20 - compliance: reports  good compliance - medication SEs: denies  Reinforce goal of <70 of LDL       Relevant Medications   amLODipine (NORVASC) 10 MG tablet   Other Relevant Orders   Lipid Panel With LDL/HDL Ratio   RESOLVED: Type 2 diabetes mellitus with diabetic neuropathy, without long-term current use of insulin (HCC)   Type 2 diabetes mellitus with hyperglycemia, without long-term current use of insulin (HCC)  T2DM - Checking BG at home: yes  - Medications: metformin and trulicity - Compliance: reports good compliance - Diet: general diet - Exercise: none- limited by back pain - eye exam: due, referral placed; complaints of blurred vision - foot exam: completed today - microalbumin: completed today - denies symptoms of hypoglycemia, polyuria, polydipsia, numbness extremities, foot ulcers/trauma       Relevant Orders   Comprehensive metabolic panel   Hemoglobin A1c   Microalbumin / creatinine urine ratio     Nervous and Auditory   Lumbar radiculopathy    Limits exercise ability and noticeable ataxic gait F/b chronic pain On gabapentin        Other   Ataxic gait    Hx of back pain and noticeable ataxic gait F/b chronic pain On gabapentin       Blurred vision, bilateral    Discussed blurred vision can come from excess sugar; emphasize importance of eye follow up for DM mgmt Referral placed      Diabetic eye exam ([place])    Due for DM eye exam Reports blurred vision Referral placed to assist      Relevant Orders   Ambulatory referral to Ophthalmology   Discoloration and thickening of nails both feet    Seen on simple foot exam for DM screening Does not wish to f/u with podiatry Denies complications with cutting Reports regular pedicures to assist      Need for influenza vaccination    Provided today      Relevant Orders   Flu Vaccine QUAD 6+ mos PF IM (Fluarix Quad PF) (Completed)   Need for shingles vaccine    Written Rx provided today; discussed need for 2  vaccines Educated on importance of vaccination with DM      Relevant Medications   Zoster Vaccine Adjuvanted [facility]) injection     Return in about 3 months (around 10/02/2021) for T2DM management.      [name], FNP, have reviewed all documentation for this visit. The documentation on 07/05/21 for the exam, diagnosis, procedures, and orders are all accurate and complete.    Gwyneth Sprout, [place] [phone] (phone) [phone] (fax)  [place]

## 2021-07-05 ENCOUNTER — Telehealth: Payer: Self-pay

## 2021-07-05 ENCOUNTER — Encounter: Payer: Self-pay | Admitting: Family Medicine

## 2021-07-05 ENCOUNTER — Ambulatory Visit (INDEPENDENT_AMBULATORY_CARE_PROVIDER_SITE_OTHER): Payer: Medicare HMO | Admitting: Family Medicine

## 2021-07-05 ENCOUNTER — Other Ambulatory Visit: Payer: Self-pay

## 2021-07-05 VITALS — BP 113/77 | HR 80 | Temp 98.8°F | Wt 224.0 lb

## 2021-07-05 DIAGNOSIS — I152 Hypertension secondary to endocrine disorders: Secondary | ICD-10-CM

## 2021-07-05 DIAGNOSIS — R260 Ataxic gait: Secondary | ICD-10-CM | POA: Insufficient documentation

## 2021-07-05 DIAGNOSIS — M5416 Radiculopathy, lumbar region: Secondary | ICD-10-CM

## 2021-07-05 DIAGNOSIS — E1140 Type 2 diabetes mellitus with diabetic neuropathy, unspecified: Secondary | ICD-10-CM | POA: Insufficient documentation

## 2021-07-05 DIAGNOSIS — E119 Type 2 diabetes mellitus without complications: Secondary | ICD-10-CM | POA: Insufficient documentation

## 2021-07-05 DIAGNOSIS — E1159 Type 2 diabetes mellitus with other circulatory complications: Secondary | ICD-10-CM

## 2021-07-05 DIAGNOSIS — E785 Hyperlipidemia, unspecified: Secondary | ICD-10-CM | POA: Diagnosis not present

## 2021-07-05 DIAGNOSIS — E1165 Type 2 diabetes mellitus with hyperglycemia: Secondary | ICD-10-CM | POA: Diagnosis not present

## 2021-07-05 DIAGNOSIS — Z23 Encounter for immunization: Secondary | ICD-10-CM

## 2021-07-05 DIAGNOSIS — Z0100 Encounter for examination of eyes and vision without abnormal findings: Secondary | ICD-10-CM

## 2021-07-05 DIAGNOSIS — E1169 Type 2 diabetes mellitus with other specified complication: Secondary | ICD-10-CM

## 2021-07-05 DIAGNOSIS — L608 Other nail disorders: Secondary | ICD-10-CM

## 2021-07-05 DIAGNOSIS — H538 Other visual disturbances: Secondary | ICD-10-CM | POA: Diagnosis not present

## 2021-07-05 MED ORDER — AMLODIPINE BESYLATE 10 MG PO TABS: 10.0000 mg | ORAL_TABLET | Freq: Every day | ORAL | 1 refills | Status: DC

## 2021-07-05 MED ORDER — ZOSTER VAC RECOMB ADJUVANTED 50 MCG/0.5ML IM SUSR
0.5000 mL | Freq: Once | INTRAMUSCULAR | 0 refills | Status: AC
Start: 2021-07-05 — End: 2021-07-05

## 2021-07-05 NOTE — Progress Notes (Signed)
° ° °  Chronic Care Management Pharmacy Assistant   Name: Emily Stokes  MRN: [id number] DOB: 1961/01/18  Patient called to be reminded of her telephone appointment with Junius Argyle, CPP on 07/06/2021 @ 0830  Patient aware of appointment date, time, and type of appointment (either telephone or in person). Patient aware to have/bring all medications, supplements, blood pressure and/or blood sugar logs to visit.  Questions: Are there any concerns you would like to discuss during your office visit? Not at this time  Are you having any problems obtaining your medications? None  Star Rating Drug: Trulicity 3 mg last filled on 07/03/2021 for a 30-Day supply with [facility] Atorvastatin 20 mg last filled on 07/03/2021 for a 30-Day supply with [facility] Metformin 500 mg last filled on 07/03/2021 for a 30-Day supply with [facility]  Any gaps in medications fill history? No  Care Gaps: Tetanus /TDAP Zoster Vaccine COVID-19 Booster 3 Diabetic Eye Exam Urine Microalbumin A1C > 9   [name], CPA/CMA Catering manager Phone: [phone]

## 2021-07-05 NOTE — Assessment & Plan Note (Signed)
Hx of back pain and noticeable ataxic gait F/b chronic pain On gabapentin

## 2021-07-05 NOTE — Assessment & Plan Note (Signed)
Due for DM eye exam Reports blurred vision Referral placed to assist

## 2021-07-05 NOTE — Assessment & Plan Note (Signed)
Chronic, stable; reinforced goal of <130/<80 Denies CP Denies SOB Denies DOE No LE Edema noted on exam Continue medication- metop and norvasc Refills provided on norvasc Seek emergent care if you develop CP, chest pain or chest pressure

## 2021-07-05 NOTE — Assessment & Plan Note (Signed)
T2DM - Checking BG at home: yes  - Medications: metformin and trulicity - Compliance: reports good compliance - Diet: general diet - Exercise: none- limited by back pain - eye exam: due, referral placed; complaints of blurred vision - foot exam: completed today - microalbumin: completed today - denies symptoms of hypoglycemia, polyuria, polydipsia, numbness extremities, foot ulcers/trauma

## 2021-07-05 NOTE — Assessment & Plan Note (Signed)
Written Rx provided today; discussed need for 2 vaccines Educated on importance of vaccination with DM

## 2021-07-05 NOTE — Assessment & Plan Note (Signed)
Limits exercise ability and noticeable ataxic gait F/b chronic pain On gabapentin

## 2021-07-05 NOTE — Assessment & Plan Note (Signed)
Discussed blurred vision can come from excess sugar; emphasize importance of eye follow up for DM mgmt Referral placed

## 2021-07-05 NOTE — Assessment & Plan Note (Signed)
Seen on simple foot exam for DM screening Does not wish to f/u with podiatry Denies complications with cutting Reports regular pedicures to assist

## 2021-07-05 NOTE — Assessment & Plan Note (Signed)
HLD - medications: Lipitor at 20 - compliance: reports good compliance - medication SEs: denies  Reinforce goal of <70 of LDL

## 2021-07-05 NOTE — Assessment & Plan Note (Signed)
Provided today 

## 2021-07-06 ENCOUNTER — Ambulatory Visit (INDEPENDENT_AMBULATORY_CARE_PROVIDER_SITE_OTHER): Payer: Medicare HMO

## 2021-07-06 ENCOUNTER — Other Ambulatory Visit: Payer: Self-pay | Admitting: Family Medicine

## 2021-07-06 DIAGNOSIS — I152 Hypertension secondary to endocrine disorders: Secondary | ICD-10-CM

## 2021-07-06 DIAGNOSIS — E1159 Type 2 diabetes mellitus with other circulatory complications: Secondary | ICD-10-CM

## 2021-07-06 DIAGNOSIS — E1165 Type 2 diabetes mellitus with hyperglycemia: Secondary | ICD-10-CM

## 2021-07-06 DIAGNOSIS — E7800 Pure hypercholesterolemia, unspecified: Secondary | ICD-10-CM

## 2021-07-06 DIAGNOSIS — M545 Low back pain, unspecified: Secondary | ICD-10-CM | POA: Diagnosis not present

## 2021-07-06 LAB — LIPID PANEL WITH LDL/HDL RATIO
Cholesterol, Total: 128 mg/dL (ref 100–199)
HDL: 36 mg/dL — ABNORMAL LOW (ref >39)
LDL Chol Calc (NIH): 79 mg/dL (ref 0–99)
LDL/HDL Ratio: 2.2 ratio (ref 0.0–3.2)
Triglycerides: 63 mg/dL (ref 0–149)
VLDL Cholesterol Cal: 13 mg/dL (ref 5–40)

## 2021-07-06 LAB — COMPREHENSIVE METABOLIC PANEL
ALT: 13 IU/L (ref 0–32)
AST: 15 IU/L (ref 0–40)
Albumin/Globulin Ratio: 1.2 (ref 1.2–2.2)
Albumin: 4.1 g/dL (ref 3.8–4.8)
Alkaline Phosphatase: 103 IU/L (ref 44–121)
BUN/Creatinine Ratio: 13 (ref 12–28)
BUN: 16 mg/dL (ref 8–27)
Bilirubin Total: 0.4 mg/dL (ref 0.0–1.2)
CO2: 22 mmol/L (ref 20–29)
Calcium: 9.4 mg/dL (ref 8.7–10.3)
Chloride: 110 mmol/L — ABNORMAL HIGH (ref 96–106)
Creatinine, Ser: 1.19 mg/dL — ABNORMAL HIGH (ref 0.57–1.00)
Globulin, Total: 3.5 g/dL (ref 1.5–4.5)
Glucose: 126 mg/dL — ABNORMAL HIGH (ref 70–99)
Potassium: 4.2 mmol/L (ref 3.5–5.2)
Sodium: 146 mmol/L — ABNORMAL HIGH (ref 134–144)
Total Protein: 7.6 g/dL (ref 6.0–8.5)
eGFR: 52 mL/min/{1.73_m2} — ABNORMAL LOW (ref >59)

## 2021-07-06 LAB — HEMOGLOBIN A1C
Est. average glucose Bld gHb Est-mCnc: 169 mg/dL
Hgb A1c MFr Bld: 7.5 % — ABNORMAL HIGH (ref 4.8–5.6)

## 2021-07-06 LAB — MICROALBUMIN / CREATININE URINE RATIO
Creatinine, Urine: 93.2 mg/dL
Microalb/Creat Ratio: 24 mg/g creat (ref 0–29)
Microalbumin, Urine: 22.2 ug/mL

## 2021-07-06 MED ORDER — ATORVASTATIN CALCIUM 20 MG PO TABS: 20.0000 mg | ORAL_TABLET | Freq: Every day | ORAL | 1 refills | Status: DC

## 2021-07-06 MED ORDER — TRULICITY 3 MG/0.5ML ~~LOC~~ SOAJ: 3.0000 mg | SUBCUTANEOUS | 1 refills | Status: DC

## 2021-07-06 NOTE — Patient Instructions (Signed)
Visit Information It was great speaking with you today!  Please let me know if you have any questions about our visit.   Goals Addressed             This Visit's Progress    Monitor and Manage My Blood Sugar-Diabetes Type 2   On track    Timeframe:  Long-Range Goal Priority:  High Start Date: 03/27/2021                            Expected End Date: 03/27/2022                      Follow Up within 30 days   - check blood sugar daily before breakfast - check blood sugar if I feel it is too high or too low - enter blood sugar readings and medication or insulin into daily log - take the blood sugar log to all doctor visits    Why is this important?   Checking your blood sugar at home helps to keep it from getting very high or very low.  Writing the results in a diary or log helps the doctor know how to care for you.  Your blood sugar log should have the time, date and the results.  Also, write down the amount of insulin or other medicine that you take.  Other information, like what you ate, exercise done and how you were feeling, will also be helpful.     Notes:         Patient Care Plan: Diabetes Type 2 (Adult)     Problem Identified: Disease Progression (Diabetes, Type 2)      Long-Range Goal: Disease Progression Prevented or Minimized   Start Date: 12/11/2020  Expected End Date: 04/10/2021  Priority: High  Note:   Objective:  Lab Results  Component Value Date   HGBA1C 14.9 (H) 06/06/2020    Lab Results  Component Value Date   HGBA1C 10.9 (A) 01/29/2021    Current Barriers:  Chronic Disease Management support and educational needs related to Diabetes self-management.  Case Manager Clinical Goal(s):  Over the next 120 days, patient will demonstrate improved adherence to prescribed treatment plan for diabetes self care/management as evidenced by: taking medications as prescribed, daily monitoring and recording CBG's,  adherence to an ADA/ carb modified  diet.   Interventions:  Collaboration with PCP regarding development and update of comprehensive plan of care as evidenced by provider attestation and co-signature Inter-disciplinary care team collaboration (see longitudinal plan of care) Discussed s/sx of hypoglycemia and hyperglycemia along with recommended interventions. Reviewed blood glucose readings. Reports numbers have improved but continues to have high readings in the 250's. She is very eager to achieve better glycemic control but prefers not to use insulin. Reports taking glipizide and metformin as prescribed. Overall A1C has improved. Decreased for 14.9% to 10.9%. Collaborated with the [facility] regarding patient's request to discuss medications for better glycemic control. The Pharmacist will reach out within the next few weeks. Discussed nutritional intake. Reports nutritional intake has improved but still attempting to monitor carb intake. Advised to continue reading nutrition labels and limiting intake of concentrated sugars. Advised to update the team if she is willing to speak with a dietician. Reviewed recommended diabetic exams. Continues to perform daily foot care. Denies significant changes with vision but notes an increased glare when readings. She is due for an eye exam. Advised to contact Optometry  to schedule as soon as possible.   Self-Care/Patient Goals: Self administer medications as prescribed Attend all scheduled provider appointments Monitor blood glucose levels and record readings Engage in low impact activity as tolerated Adhere to prescribed ADA/carb modified Complete outreach with the [facility] Pharmacist Contact Optometry to schedule an updated eye exam Contact clinic with questions and new concerns as needed   Follow Up Plan:  Will follow up in two months    Patient Care Plan: Hypertension (Adult)     Problem Identified: Hypertension (Hypertension)      Long-Range Goal: Hypertension Monitored  Completed 03/13/2021  Start Date: 12/11/2020  Expected End Date: 04/10/2021  Priority: High  Note:    Current Barriers:  Chronic Disease Management support and educational needs related to Hypertension and Dyslipidemia.  Case Manager Clinical Goal(s):  Over the next 120 days, patient will demonstrate improved adherence to prescribed treatment plan as evidenced by taking all medications as prescribed, monitoring and recording blood pressure and adhering to a low sodium/DASH diet.  Interventions:  Inter-disciplinary care team collaboration (see longitudinal plan of care) Reviewed compliance with current treatment plan. Advised to continue taking medications as prescribed. Advised to update her  PCP if unable to tolerate regimen. Currently tolerating statin well. Reviewed established BP parameters and encouraged to monitor and record readings. Advised to continue monitoring sodium intake and attempt to avoid highly processed foods when possible. Reviewed s/sx of heart attack, stroke and worsening symptoms that require immediate medical attention.   Self-Care/Patient Goals: Self administer medications as prescribed Attend all scheduled provider appointments Monitor BP and record readings Adhere to a low sodium/DASH diet Engage in low impact activity as tolerated Contact provider office with questions and new concerns as needed     Patient Care Plan: General Pharmacy (Adult)     Problem Identified: Hypertension, Hyperlipidemia, Diabetes, and Chronic Pain   Priority: High     Long-Range Goal: Patient-Specific Goal   Start Date: 03/27/2021  Expected End Date: 03/27/2022  This Visit's Progress: On track  Recent Progress: On track  Priority: High  Note:   Current Barriers:  Unable to achieve control of diabetes   Pharmacist Clinical Goal(s):  Patient will maintain control of diabetes as evidenced by A1c less than 8%  through collaboration with PharmD and provider.    Interventions: 1:1 collaboration with Gwyneth Sprout, FNP regarding development and update of comprehensive plan of care as evidenced by provider attestation and co-signature Inter-disciplinary care team collaboration (see longitudinal plan of care) Comprehensive medication review performed; medication list updated in electronic medical record  Hypertension (BP goal <130/80) -Controlled -Current treatment: Amlodipine 10 mg daily: Appropriate, Effective, Safe, Accessible Metoprolol 25 mg twice daily: Query appropriate -Medications previously tried: NA  -Current home readings: Does not monitor at home -Denies hypotensive/hypertensive symptoms -Questionably appropriate use of beta blocker given lack of clear indication. Will address at future visit. -Given borderline CKD, patient may benefit from ARB in place of beta blocker. Will defer for now.  -Recommended to continue current medication  Hyperlipidemia: (LDL goal < 70) -Uncontrolled -Current treatment: Atorvastatin 20 mg daily  -Current treatment: Aspirin 81 mg daily  -Medications previously tried: NA  -Patient close to goal, but LDL still slightly elevated. Will wait for now  -Continue current medications  Diabetes (A1c goal <7%) -Uncontrolled, but greatly approved.  -Diagnosed 2018 -Current medications: Metformin 500 mg 2 tablets twice daily: Appropriate, Effective, Safe, Accessible Trulicity 3 mg weekly on tuesdays: Appropriate, Effective, Safe, Accessible  -Medications previously tried:  NA  -Current home glucose readings:  Fasting: 123, 140, highest in recall 223.  -Denies hypoglycemic/hyperglycemic symptoms.   -Current meal patterns: Significantly reduced appetite since starting Trulicity.  Breakfast: Banana Lunch: Sometimes PB sandwich  Supper: Salad, Spaghetti, or chicken sandwich,   Snacks: peanuts, banana, or apple  drinks: Mainly water.  -Current exercise: Unable walk due to leg pain.  -Continue current  medications.   Chronic Pain (Goal: Minimize pain symptoms) -Not ideally controlled -Managed by Dr. Holley Raring -Current treatment  Gabapentin 300 mg twice daily  -Medications previously tried: NA  -Recommended to continue current medication  Patient Goals/Self-Care Activities Patient will:  - check glucose daily before breakfast, document, and provide at future appointments  Follow Up Plan: Telephone follow up appointment with care management team member scheduled for:  10/02/2021 at 8:30 AM      Patient agreed to services and verbal consent obtained.   Patient verbalizes understanding of instructions and care plan provided today and agrees to view in [place]. Active MyChart status confirmed with patient.    Junius Argyle, PharmD, [place], CPP  Clinical Pharmacist Practitioner  [facility] [phone]

## 2021-07-06 NOTE — Progress Notes (Signed)
Chronic Care Management Pharmacy Note  07/06/2021 Name:  Emily Stokes MRN:  [id number] DOB:  [birth year]  Summary: Patient presents for CCM follow-up. Blood pressure and blood sugars significantly improved.   Recommendations/Changes made from today's visit: Continue current medications  Plan: CPP follow-up in 3 months  Subjective: Emily Stokes is an 61 y.o. year old female who is a primary patient of Gwyneth Sprout, [place].  The CCM team was consulted for assistance with disease management and care coordination needs.    Engaged with patient by telephone for follow up visit in response to provider referral for pharmacy case management and/or care coordination services.   Consent to Services:  The patient was given information about Chronic Care Management services, agreed to services, and gave verbal consent prior to initiation of services.  Please see initial visit note for detailed documentation.   Patient Care Team: Gwyneth Sprout, FNP as PCP - General (Family Medicine) [place], [place] (Optometry) Gillis Santa, MD as Consulting Physician (Pain Medicine) Neldon Labella, RN as Case Manager Germaine Pomfret, [facility] (Pharmacist)  Recent office visits: 07/05/21: Patient presented to [name], FNP for follow-up.   04/19/21: Patient presented to [facility], PA-C for UTI. Macrobid.  01/29/2021 Lelon Huh, MD (PCP Office Visit) for Urinary Frequency- Started: Glipizide 2.5 mg daily, Lab order placed, patient instructed to follow-up in 2 months   Recent consult visits: 01/17/2021 Gillis Santa, MD (Pain Management Telemedicine) for Follow-up- No medication changes noted, no orders placed, no follow-up noted   12/20/2020 Gillis Santa, MD (Pain Management) for Hip Pain- Procedure Office Visit for Therapeutic InterLaminar Epidural Steroid Injection- No medication changes noted, Patient instructed to follow-up in 4 weeks.    12/14/2020 Gillis Santa, MD (Pain Management) for  Pain- Stopped: Benzonatate 100 mg due to patient not taking, no orders placed, patient instructed to follow-up in 6 days  Hospital visits: Medication Reconciliation was completed by comparing discharge summary, patients EMR and Pharmacy list, and upon discussion with patient.  Objective:  Lab Results  Component Value Date   CREATININE 1.19 (H) 07/05/2021   BUN 16 07/05/2021   GFRNONAA 55 (L) 06/06/2020   GFRAA 63 06/06/2020   NA 146 (H) 07/05/2021   K 4.2 07/05/2021   CALCIUM 9.4 07/05/2021   CO2 22 07/05/2021   GLUCOSE 126 (H) 07/05/2021    Lab Results  Component Value Date/Time   HGBA1C 7.5 (H) 07/05/2021 09:29 AM   HGBA1C 10.9 (A) 01/29/2021 09:26 AM   HGBA1C 14.9 (H) 06/06/2020 10:44 AM   MICROALBUR 50 06/06/2020 10:48 AM   MICROALBUR 20 01/30/2018 04:54 PM    Last diabetic Eye exam:  Lab Results  Component Value Date/Time   HMDIABEYEEXA No Retinopathy 09/15/2019 12:00 AM    Last diabetic Foot exam: No results found for: HMDIABFOOTEX   Lab Results  Component Value Date   CHOL 128 07/05/2021   HDL 36 (L) 07/05/2021   LDLCALC 79 07/05/2021   TRIG 63 07/05/2021   CHOLHDL 5.3 (H) 04/20/2019    Hepatic Function Latest Ref Rng & Units 07/05/2021 06/06/2020 07/28/2019  Total Protein 6.0 - 8.5 g/dL 7.6 7.4 7.4  Albumin 3.8 - 4.8 g/dL 4.1 4.0 4.4  AST 0 - 40 IU/L '15 31 17  ' ALT 0 - 32 IU/L '13 25 16  ' Alk Phosphatase 44 - 121 IU/L 103 116 114  Total Bilirubin 0.0 - 1.2 mg/dL 0.4 0.4 0.4    Lab Results  Component Value Date/Time  TSH 1.500 07/28/2019 08:25 AM   TSH 1.600 11/06/2018 11:10 AM    CBC Latest Ref Rng & Units 07/28/2019 04/20/2019 11/06/2018  WBC 3.4 - 10.8 x10E3/uL 6.0 5.3 5.7  Hemoglobin 11.1 - 15.9 g/dL 11.7 11.1 11.6  Hematocrit 34.0 - 46.6 % 35.1 35.6 34.3  Platelets 150 - 450 x10E3/uL 374 396 358    No results found for: VD25OH  Clinical ASCVD: No  The ASCVD Risk score (Arnett DK, et al., 2019) failed to calculate for the following reasons:    The valid total cholesterol range is 130 to 320 mg/dL    Depression screen [facility] 2/9 07/05/2021 05/09/2021 12/11/2020  Decreased Interest 0 0 0  Down, Depressed, Hopeless 0 0 0  PHQ - 2 Score 0 0 0  Altered sleeping 0 - -  Tired, decreased energy 2 - -  Change in appetite 1 - -  Feeling bad or failure about yourself  0 - -  Trouble concentrating 0 - -  Moving slowly or fidgety/restless 0 - -  Suicidal thoughts 0 - -  PHQ-9 Score 3 - -  Difficult doing work/chores Extremely dIfficult - -    Social History   Tobacco Use  Smoking Status Never  Smokeless Tobacco Never   BP Readings from Last 3 Encounters:  07/05/21 113/77  04/19/21 122/79  01/29/21 138/87   Pulse Readings from Last 3 Encounters:  07/05/21 80  04/19/21 82  01/29/21 79   Wt Readings from Last 3 Encounters:  07/05/21 224 lb (101.6 kg)  04/19/21 224 lb (101.6 kg)  01/29/21 223 lb (101.2 kg)   BMI Readings from Last 3 Encounters:  07/05/21 33.08 kg/m  04/19/21 33.08 kg/m  01/29/21 32.93 kg/m    Assessment/Interventions: Review of patient past medical history, allergies, medications, health status, including review of consultants reports, laboratory and other test data, was performed as part of comprehensive evaluation and provision of chronic care management services.   SDOH:  (Social Determinants of Health) assessments and interventions performed: Yes SDOH Interventions    Flowsheet Row Most Recent Value  SDOH Interventions   Financial Strain Interventions Other (Comment)  [PAP]         SDOH Screenings   Alcohol Screen: Low Risk    Last Alcohol Screening Score (AUDIT): 0  Depression (PHQ2-9): Low Risk    PHQ-2 Score: 3  Financial Resource Strain: Medium Risk   Difficulty of Paying Living Expenses: Somewhat hard  Food Insecurity: No Food Insecurity   Worried About Charity fundraiser in the Last Year: Never true   Ran Out of Food in the Last Year: Never true  Housing: Low Risk    Last Housing  Risk Score: 0  Physical Activity: Inactive   Days of Exercise per Week: 0 days   Minutes of Exercise per Session: 0 min  Social Connections: Moderately Isolated   Frequency of Communication with Friends and Family: More than three times a week   Frequency of Social Gatherings with Friends and Family: Once a week   Attends Religious Services: More than 4 times per year   Active Member of [employer] or Organizations: No   Attends Archivist Meetings: Never   Marital Status: Widowed  Stress: No Stress Concern Present   Feeling of Stress : Not at all  Tobacco Use: Low Risk    Smoking Tobacco Use: Never   Smokeless Tobacco Use: Never   Passive Exposure: Not on file  Transportation Needs: No Transportation Needs  Lack of Transportation (Medical): No   Lack of Transportation (Non-Medical): No    CCM Care Plan  Allergies  Allergen Reactions   Bactrim [Sulfamethoxazole-Trimethoprim] Swelling    Medications Reviewed Today     Reviewed by Gwyneth Sprout, FNP (Family Nurse Practitioner) on 07/05/21 at [place] List Status: <None>   Medication Order Taking? Sig Documenting Provider Last Dose Status Informant  acetaminophen (TYLENOL) 500 MG tablet [id number] Yes Take 500 mg by mouth every 6 (six) hours as needed. [provider] Taking Active   amLODipine (NORVASC) 10 MG tablet [id number]  Take 1 tablet (10 mg total) by mouth daily. Gwyneth Sprout, FNP  Active   aspirin 81 MG EC tablet [id number] Yes Take 1 tablet (81 mg total) by mouth daily. Swallow whole. Birdie Sons, MD Taking Active   atorvastatin (LIPITOR) 20 MG tablet [id number] Yes Take 1 tablet (20 mg total) by mouth daily. Please schedule an office visit before anymore refills. Birdie Sons, MD Taking Active   Blood Glucose Monitoring Suppl (ACCU-CHEK GUIDE ME) w/Device Drucie Opitz [id number] Yes  [provider] Taking Active   Dulaglutide (TRULICITY) 3 MC/3.7VO SOPN [id number] Yes Inject 3 mg as directed once  a week. Birdie Sons, MD Taking Active   gabapentin (NEURONTIN) 300 MG capsule [id number] Yes Take 1 capsule (300 mg total) by mouth 3 (three) times daily. Gillis Santa, MD Taking Active   Ibuprofen-diphenhydrAMINE Cit (IBUPROFEN PM PO) [id number] Yes Take by mouth at bedtime as needed. [provider] Taking Active   Lancet Devices (EASY MINI EJECT LANCING DEVICE) MISC [id number] Yes  [provider] Taking Active   Lancets [facility] DELICA PLUS [id number]) [place] [id number] Yes USE AS DIRECTED TWICE DAILY IN THE MORNING AND AT BEDTIME Chrismon, [name], PA-C Taking Active   metFORMIN (GLUCOPHAGE) 500 MG tablet [id number] Yes Take 2 tablets (1,000 mg total) by mouth 2 (two) times daily with a meal. Birdie Sons, MD Taking Active   metoprolol tartrate (LOPRESSOR) 25 MG tablet [id number] Yes Take 1 tablet (25 mg total) by mouth 2 (two) times daily. Birdie Sons, MD Taking Active   Zoster Vaccine Adjuvanted [facility]) injection [id number] Yes Inject 0.5 mLs into the muscle once for 1 dose. Gwyneth Sprout, FNP  Active   Med List Note Landis Martins, [place] 07/13/20 7615): UDS 10-21-19 MR 09/11/2020 Opiod contracts signed 02/12/18 07-13-20 PA request for Tramadol submitted. Key [id number]  dw            Patient Active Problem List   Diagnosis Date Noted   Need for influenza vaccination 07/05/2021   Hyperlipidemia associated with type 2 diabetes mellitus ([place]) 07/05/2021   Diabetic eye exam ([place]) 07/05/2021   Hypertension associated with diabetes ([place]) 07/05/2021   Need for shingles vaccine 07/05/2021   Ataxic gait 07/05/2021   Discoloration and thickening of nails both feet 07/05/2021   Blurred vision, bilateral 07/05/2021   Acute cystitis without hematuria 04/19/2021   Spinal stenosis, lumbar region, with neurogenic claudication 04/27/2020   Personal history of colonic polyps    Polyp of transverse colon    Lumbar facet arthropathy 03/18/2018   Lumbar spondylosis  03/18/2018   Encounter for screening colonoscopy    Internal hemorrhoids    Benign neoplasm of descending colon    Benign neoplasm of cecum    Type 2 diabetes mellitus with hyperglycemia, without long-term current use of insulin ([place]) 07/31/2017   Recurrent abdominal hernia without obstruction or gangrene 07/24/2017  Cellulitis of face 05/30/2016   Chronic pain syndrome 05/30/2016   Nasal septal abscess 05/30/2016   Pain in elbow 02/05/2016   Lumbar degenerative disc disease 09/14/2015   Facet syndrome, lumbar 09/14/2015   Lumbar radiculopathy 09/14/2015   Sacroiliac joint dysfunction 09/14/2015   History of chicken pox 02/02/2015   Leg pain 02/02/2015   Hypercholesteremia 02/02/2015   Blood glucose elevated 02/02/2015   Pain in soft tissues of limb 02/02/2015   Nonspecific ST-T changes 02/02/2015   Awareness of heartbeats 02/02/2015   Pain in extremity at multiple sites 02/02/2015   Snores 02/02/2015   Hernia of anterior abdominal wall 02/24/2012   Pain in shoulder 12/05/2011   Adnexal mass 10/30/2011   Pelvic cyst 10/30/2011   Palpitations 03/14/2011   Abnormal finding on EKG 03/14/2011   Obesity 03/14/2011   Infected surgical wound 09/15/2008   Urinary system disease 08/10/2008   Arthralgia of lower leg 03/28/2008   Anemia, iron deficiency 06/17/2007   Calculus of kidney 05/26/2007    Immunization History  Administered Date(s) Administered   Influenza Split 05/26/2007   Influenza,inj,Quad PF,6+ Mos 05/31/2016, 05/19/2017, 01/30/2018, 02/11/2020, 07/05/2021   PFIZER(Purple Top)SARS-COV-2 Vaccination 08/05/2019, 08/31/2019   Pneumococcal Polysaccharide-23 01/30/2018    Conditions to be addressed/monitored:  Hypertension, Hyperlipidemia, Diabetes, and Chronic Pain  Care Plan : General Pharmacy (Adult)  Updates made by Germaine Pomfret, RPH since 07/06/2021 12:00 AM     Problem: Hypertension, Hyperlipidemia, Diabetes, and Chronic Pain   Priority: High      Long-Range Goal: Patient-Specific Goal   Start Date: 03/27/2021  Expected End Date: 03/27/2022  This Visit's Progress: On track  Recent Progress: On track  Priority: High  Note:   Current Barriers:  Unable to achieve control of diabetes   Pharmacist Clinical Goal(s):  Patient will maintain control of diabetes as evidenced by A1c less than 8%  through collaboration with PharmD and provider.   Interventions: 1:1 collaboration with Gwyneth Sprout, FNP regarding development and update of comprehensive plan of care as evidenced by provider attestation and co-signature Inter-disciplinary care team collaboration (see longitudinal plan of care) Comprehensive medication review performed; medication list updated in electronic medical record  Hypertension (BP goal <130/80) -Controlled -Current treatment: Amlodipine 10 mg daily: Appropriate, Effective, Safe, Accessible Metoprolol 25 mg twice daily: Query appropriate -Medications previously tried: NA  -Current home readings: Does not monitor at home -Denies hypotensive/hypertensive symptoms -Questionably appropriate use of beta blocker given lack of clear indication. Will address at future visit. -Given borderline CKD, patient may benefit from ARB in place of beta blocker. Will defer for now.  -Recommended to continue current medication  Hyperlipidemia: (LDL goal < 70) -Uncontrolled -Current treatment: Atorvastatin 20 mg daily  -Current treatment: Aspirin 81 mg daily  -Medications previously tried: NA  -Patient close to goal, but LDL still slightly elevated. Will wait for now  -Continue current medications  Diabetes (A1c goal <7%) -Uncontrolled, but greatly approved.  -Diagnosed 2018 -Current medications: Metformin 500 mg 2 tablets twice daily: Appropriate, Effective, Safe, Accessible Trulicity 3 mg weekly on tuesdays: Appropriate, Effective, Safe, Accessible  -Medications previously tried: NA  -Current home glucose readings:   Fasting: 123, 140, highest in recall 223.  -Denies hypoglycemic/hyperglycemic symptoms.   -Current meal patterns: Significantly reduced appetite since starting Trulicity.  Breakfast: Banana Lunch: Sometimes PB sandwich  Supper: Salad, Spaghetti, or chicken sandwich,   Snacks: peanuts, banana, or apple  drinks: Mainly water.  -Current exercise: Unable walk due to leg pain.  -Continue  current medications.   Chronic Pain (Goal: Minimize pain symptoms) -Not ideally controlled -Managed by Dr. Holley Raring -Current treatment  Gabapentin 300 mg twice daily  -Medications previously tried: NA  -Recommended to continue current medication  Patient Goals/Self-Care Activities Patient will:  - check glucose daily before breakfast, document, and provide at future appointments  Follow Up Plan: Telephone follow up appointment with care management team member scheduled for:  10/02/2021 at 8:30 AM       Medication Assistance: None required.  Patient affirms current coverage meets needs.  Compliance/Adherence/Medication fill history: Care Gaps: Zoster Vaccines PNA Vaccine COVID-19 Vaccine Booster 3 Diabetic Eye Exam Influenza Vaccine  Star-Rating Drugs: Atorvastatin 20 mg last filled on 07/03/2021 for a 30-Day supply with [facility] Metformin 500 mg  last filled on 07/03/2021 for a 30-Day supply with [facility] Trulicity 1.5 mg  last filled on 07/03/2021 for a 30-Day supply with [facility]   Patient's preferred pharmacy is:  [facility] - [place], [place] - [address] [place] [address] Phone: [phone] Fax: [phone]  Patient decided to: Utilize [facility] for medication synchronization, packaging and delivery  Care Plan and Follow Up Patient Decision:  Patient agrees to Care Plan and Follow-up.  Plan: Telephone follow up appointment with care management team member scheduled for:  10/02/2021 at  8:30 AM  Junius Argyle, PharmD, [place], [place] [phone]

## 2021-07-10 DIAGNOSIS — E7800 Pure hypercholesterolemia, unspecified: Secondary | ICD-10-CM

## 2021-07-10 DIAGNOSIS — I152 Hypertension secondary to endocrine disorders: Secondary | ICD-10-CM

## 2021-07-10 DIAGNOSIS — E1159 Type 2 diabetes mellitus with other circulatory complications: Secondary | ICD-10-CM

## 2021-07-10 DIAGNOSIS — E1165 Type 2 diabetes mellitus with hyperglycemia: Secondary | ICD-10-CM

## 2021-07-17 ENCOUNTER — Other Ambulatory Visit: Payer: Self-pay

## 2021-07-17 ENCOUNTER — Ambulatory Visit: Payer: Self-pay | Admitting: *Deleted

## 2021-07-17 ENCOUNTER — Ambulatory Visit (INDEPENDENT_AMBULATORY_CARE_PROVIDER_SITE_OTHER): Payer: 59 | Admitting: Physician Assistant

## 2021-07-17 ENCOUNTER — Encounter: Payer: Self-pay | Admitting: Physician Assistant

## 2021-07-17 VITALS — BP 110/76 | HR 97 | Temp 99.0°F | Resp 16 | Ht 69.0 in | Wt 220.2 lb

## 2021-07-17 DIAGNOSIS — L02414 Cutaneous abscess of left upper limb: Secondary | ICD-10-CM | POA: Diagnosis not present

## 2021-07-17 MED ORDER — DOXYCYCLINE HYCLATE 100 MG PO TABS: 100.0000 mg | ORAL_TABLET | Freq: Two times a day (BID) | ORAL | 0 refills | Status: AC

## 2021-07-17 NOTE — Patient Instructions (Signed)
In order to help the abscess on your elbow resolve I am sending in an antibiotic  ?Doxycycline 100 mg to be taken by mouth every 12 hours for the next 7 days. Complete the entire course of antibiotics even if you feel better ? ?You can take Tylenol for pain ?You can apply warm compresses to the area to assist with drainage ? ?If you notice the area swelling, becoming more warm to the touch, you develop a fever over 100.0 degrees, you feel faint, more tired or lightheaded please go to the ED for assistance.  ? ?

## 2021-07-17 NOTE — Telephone Encounter (Signed)
Reason for Disposition ? [1] Face wound AND [2] looks infected (e.g., spreading redness) ? ?Answer Assessment - Initial Assessment Questions ?1. ONSET: "When did the pain start?" ?    My arm is swollen and red.   It feels hot and very painful.   ?2. LOCATION: "Where is the pain located?" ?    Left arm Beside my elbow on the outside ?3. PAIN: "How bad is the pain?" (Scale 1-10; or mild, moderate, severe) ?  - MILD (1-3): doesn't interfere with normal activities ?  - MODERATE (4-7): interferes with normal activities (e.g., work or school) or awakens from sleep ?  - SEVERE (8-10): excruciating pain, unable to do any normal activities, unable to hold a cup of water ?    It looks like a white bump.    No draining.   I tried to pop it but it won't pop.   My arm feels hard and hot.  ?4. WORK OR EXERCISE: "Has there been any recent work or exercise that involved this part of the body?" ?    *No Answer* ?5. CAUSE: "What do you think is causing the arm pain?" ?    *No Answer* ?6. OTHER SYMPTOMS: "Do you have any other symptoms?" (e.g., neck pain, swelling, rash, fever, numbness, weakness) ?    *No Answer* ?7. PREGNANCY: "Is there any chance you are pregnant?" "When was your last menstrual period?" ?    *No Answer* ? ?Protocols used: Arm Pain-A-AH, Wound Infection-A-AH ? ?

## 2021-07-17 NOTE — Telephone Encounter (Signed)
?  Chief Complaint: Left arm is red, swollen, hard and hot feeling with a white bump that is painful ?Symptoms: see above ?Frequency: Noticed it Sunday ?Pertinent Negatives: Patient denies injuries or bug bites ?Disposition: '[]'$ ED /'[]'$ Urgent Care (no appt availability in office) / '[x]'$ Appointment(In office/virtual)/ '[]'$  [facility]/ '[]'$ Home Care/ '[]'$ Refused Recommended Disposition /'[]'$ [facility]/ '[]'$  Follow-up with PCP ?Additional Notes:   ?

## 2021-07-17 NOTE — Progress Notes (Signed)
?  ? ? ?Established patient visit ? ? ?Patient: Emily Stokes   DOB: Aug 15, 1960   61 y.o. Female  MRN: [id number] ?Visit Date: 07/17/2021 ? ?I,Joseline E Rosas,acting as a scribe for [employer], PA-C.,have documented all relevant documentation on the behalf of [place], PA-C,as directed by  [employer], PA-C while in the presence of Younique Casad E Gwendoline Judy, PA-C.  ? ?Today's healthcare provider: [name] Samaria Anes, PA-C  ?Introduced myself to the patient as a Journalist, newspaper and provided education on APPs in clinical practice.  ? ? ?Chief Complaint  ?Patient presents with  ? Cyst  ? ?Subjective  ?  ?HPI  ?Patient here with concerns of cyst that she notice on Sunday. Located in left elbow. Some redness and swelling. Not draining. She has been using warm compress. ? ?States she noticed this on Sunday  ?Denies bites or injuries to the area ?States she has had a similar issue before and had it drained in office. ?Reports pain with elbow ROM ?Pain level: 10/10 ?She states she was putting a cream on it [employer] - states this did make it "turn" ?She denies drainage from area ?States it has gotten bigger as time has passed.  ? ?Medications: ?Outpatient Medications Prior to Visit  ?Medication Sig  ? acetaminophen (TYLENOL) 500 MG tablet Take 500 mg by mouth every 6 (six) hours as needed.  ? amLODipine (NORVASC) 10 MG tablet Take 1 tablet (10 mg total) by mouth daily.  ? aspirin 81 MG EC tablet Take 1 tablet (81 mg total) by mouth daily. Swallow whole.  ? atorvastatin (LIPITOR) 20 MG tablet Take 1 tablet (20 mg total) by mouth daily.  ? Blood Glucose Monitoring Suppl (ACCU-CHEK GUIDE ME) w/Device KIT   ? Dulaglutide (TRULICITY) 3 YT/0.1SW SOPN Inject 3 mg as directed once a week.  ? gabapentin (NEURONTIN) 300 MG capsule Take 1 capsule (300 mg total) by mouth 3 (three) times daily.  ? Ibuprofen-diphenhydrAMINE Cit (IBUPROFEN PM PO) Take by mouth at bedtime as needed.  ? Lancet Devices (EASY MINI EJECT LANCING DEVICE) MISC   ? Lancets (ONETOUCH  DELICA PLUS [id number]) MISC USE AS DIRECTED TWICE DAILY IN THE MORNING AND AT BEDTIME  ? metFORMIN (GLUCOPHAGE) 500 MG tablet Take 2 tablets (1,000 mg total) by mouth 2 (two) times daily with a meal.  ? metoprolol tartrate (LOPRESSOR) 25 MG tablet Take 1 tablet (25 mg total) by mouth 2 (two) times daily.  ? ?No facility-administered medications prior to visit.  ? ? ?Review of Systems  ?Constitutional:  Positive for chills and fatigue. Negative for diaphoresis and fever.  ?Musculoskeletal:  Positive for arthralgias. Negative for myalgias.  ?Skin:   ?     Concern for redness, swelling and pain along later/dorsal aspect of left elbow. ?  ? ? ?  Objective  ?  ?BP 110/76 (BP Location: Right Arm, Patient Position: Sitting, Cuff Size: Large)   Pulse 97   Temp 99 ?F (37.2 ?C) (Oral)   Resp 16   Ht _0  (1.753 m)   Wt 220 lb 3.2 oz (99.9 kg)   BMI 32.52 kg/m?  ? ? ?Physical Exam ?Vitals reviewed.  ?Constitutional:   ?   Appearance: Normal appearance.  ?HENT:  ?   Head: Normocephalic and atraumatic.  ?Musculoskeletal:  ?   Right elbow: Normal.  ?   Left elbow: Decreased range of motion.  ?   Comments: Pain with extension and supination in left elbow ?  ?Skin: ?  General: Skin is warm and dry.  ?   Findings: Abscess and erythema present.  ? ?    ?Neurological:  ?   General: No focal deficit present.  ?   Mental Status: She is alert.  ?   GCS: GCS eye subscore is 4. GCS verbal subscore is 5. GCS motor subscore is 6.  ?   Motor: No weakness.  ?  ? ? ?No results found for any visits on 07/17/21. ? Assessment & Plan  ?  ? ?Problem List Items Addressed This Visit   ?None ?Visit Diagnoses   ? ? Cutaneous abscess of left upper extremity    -  Primary ?Acute, new problem ?Indurated with overlying erythema  ?Recommend abx - Doxycycline 154m PO BID x 7 days provided ?Recommend tylenol over Ibuprofen due to GFR from last CMP  ?Recommend warm compresses to assist with drainage.  ?Discussed return and ED precautions  ?Follow up as  needed ?  ? Relevant Medications  ? doxycycline (VIBRA-TABS) 100 MG tablet  ? ?  ? ? ? ?No follow-ups on file. ? ? ?I, Kaianna Dolezal E Shigeko Manard, PA-C, have reviewed all documentation for this visit. The documentation on 07/17/21 for the exam, diagnosis, procedures, and orders are all accurate and complete. ? ? ?Jaimya Feliciano, EGlennie IsleMPH ?[place] ? ? ? ? ?No follow-ups on file.  ?   ? ? ? ? ?Makinsley Schiavi E Cayleb Jarnigan, PA-C  ?[place]?[phone](phone) ?[phone](fax) ? ?[facility]  ?

## 2021-07-25 ENCOUNTER — Telehealth: Payer: Self-pay

## 2021-07-25 NOTE — Telephone Encounter (Signed)
Copied from [place] [id number]. Topic: General - Other ?>> Jul 25, 2021 10:56 AM Leward Quan A wrote: ?Reason for CRM: Larena Glassman with [employer] called in to inquire of [name] or her nurse if forms were received for diabetic shoes for patient that was faxed over on 07/25/21. Would like nurse to have forms completed and have providers signature once received and faxed back  Ph# [phone] ?

## 2021-07-26 ENCOUNTER — Ambulatory Visit (INDEPENDENT_AMBULATORY_CARE_PROVIDER_SITE_OTHER): Payer: 59

## 2021-07-26 ENCOUNTER — Other Ambulatory Visit: Payer: Self-pay | Admitting: Student in an Organized Health Care Education/Training Program

## 2021-07-26 NOTE — Chronic Care Management (AMB) (Signed)
?Chronic Care Management  ? ?CCM RN Visit Note ? ?07/26/2021 ?Name: Emily Stokes MRN: [id number] DOB: Dec 30, 1960 ? ?Subjective: ?Emily Stokes is a 61 y.o. year old female who is a primary care patient of Gwyneth Sprout, FNP. The care management team was consulted for assistance with disease management and care coordination needs.   ? ?Engaged with patient by telephone for follow up visit in response to provider referral for case management and care coordination services.  ? ?Consent to Services:  ?The patient was given information about Chronic Care Management services, agreed to services, and gave verbal consent prior to initiation of services.  Please see initial visit note for detailed documentation.  ? ?Assessment: Review of patient past medical history, allergies, medications, health status, including review of consultants reports, laboratory and other test data, was performed as part of comprehensive evaluation and provision of chronic care management services.  ? ?SDOH (Social Determinants of Health) assessments and interventions performed: No ? ?CCM Care Plan ? ?Allergies  ?Allergen Reactions  ? Bactrim [Sulfamethoxazole-Trimethoprim] Swelling  ? ? ?Outpatient Encounter Medications as of 07/26/2021  ?Medication Sig  ? acetaminophen (TYLENOL) 500 MG tablet Take 500 mg by mouth every 6 (six) hours as needed.  ? amLODipine (NORVASC) 10 MG tablet Take 1 tablet (10 mg total) by mouth daily.  ? aspirin 81 MG EC tablet Take 1 tablet (81 mg total) by mouth daily. Swallow whole.  ? atorvastatin (LIPITOR) 20 MG tablet Take 1 tablet (20 mg total) by mouth daily.  ? Blood Glucose Monitoring Suppl (ACCU-CHEK GUIDE ME) w/Device KIT   ? Dulaglutide (TRULICITY) 3 ZD/6.6YQ SOPN Inject 3 mg as directed once a week.  ? gabapentin (NEURONTIN) 300 MG capsule Take 1 capsule (300 mg total) by mouth 3 (three) times daily.  ? Ibuprofen-diphenhydrAMINE Cit (IBUPROFEN PM PO) Take by mouth at bedtime as needed.  ? Lancet Devices (EASY  MINI EJECT LANCING DEVICE) MISC   ? Lancets (ONETOUCH DELICA PLUS [id number]) MISC USE AS DIRECTED TWICE DAILY IN THE MORNING AND AT BEDTIME  ? metFORMIN (GLUCOPHAGE) 500 MG tablet Take 2 tablets (1,000 mg total) by mouth 2 (two) times daily with a meal.  ? metoprolol tartrate (LOPRESSOR) 25 MG tablet Take 1 tablet (25 mg total) by mouth 2 (two) times daily.  ? ?No facility-administered encounter medications on file as of 07/26/2021.  ? ? ?Patient Active Problem List  ? Diagnosis Date Noted  ? Need for influenza vaccination 07/05/2021  ? Hyperlipidemia associated with type 2 diabetes mellitus ([place]) 07/05/2021  ? Diabetic eye exam ([place]) 07/05/2021  ? Hypertension associated with diabetes ([place]) 07/05/2021  ? Need for shingles vaccine 07/05/2021  ? Ataxic gait 07/05/2021  ? Discoloration and thickening of nails both feet 07/05/2021  ? Blurred vision, bilateral 07/05/2021  ? Spinal stenosis, lumbar region, with neurogenic claudication 04/27/2020  ? Personal history of colonic polyps   ? Polyp of transverse colon   ? Lumbar facet arthropathy 03/18/2018  ? Lumbar spondylosis 03/18/2018  ? Encounter for screening colonoscopy   ? Internal hemorrhoids   ? Benign neoplasm of descending colon   ? Benign neoplasm of cecum   ? Type 2 diabetes mellitus with hyperglycemia, without long-term current use of insulin ([place]) 07/31/2017  ? Recurrent abdominal hernia without obstruction or gangrene 07/24/2017  ? Chronic pain syndrome 05/30/2016  ? Nasal septal abscess 05/30/2016  ? Lumbar degenerative disc disease 09/14/2015  ? Facet syndrome, lumbar 09/14/2015  ? Lumbar radiculopathy 09/14/2015  ? Sacroiliac  joint dysfunction 09/14/2015  ? History of chicken pox 02/02/2015  ? Hypercholesteremia 02/02/2015  ? Blood glucose elevated 02/02/2015  ? Pain in soft tissues of limb 02/02/2015  ? Nonspecific ST-T changes 02/02/2015  ? Awareness of heartbeats 02/02/2015  ? Pain in extremity at multiple sites 02/02/2015  ? Snores 02/02/2015  ? Hernia  of anterior abdominal wall 02/24/2012  ? Adnexal mass 10/30/2011  ? Pelvic cyst 10/30/2011  ? Palpitations 03/14/2011  ? Abnormal finding on EKG 03/14/2011  ? Obesity 03/14/2011  ? Urinary system disease 08/10/2008  ? Arthralgia of lower leg 03/28/2008  ? Anemia, iron deficiency 06/17/2007  ? Calculus of kidney 05/26/2007  ? ? ?Patient Care Plan: RN Care Management Plan o f Care  ?  ? ?Problem Identified: DM, HTN, HLD   ?  ? ?Long-Range Goal: Disease Progression Prevented or Minimized   ?Start Date: 07/26/2021  ?Expected End Date: 10/24/2021  ?Priority: High  ?Note:   ?Current Barriers:  ?Chronic Disease Management support and education needs related to HTN, HLD, and DMII  ? ?RNCM Clinical Goal(s):  ?Patient will demonstrate Ongoing adherence to prescribed treatment plan for HTN, HLD, and DMII through collaboration with the provider, RN Care Manager and the care team.  ? ?Interventions: ?1:1 collaboration with primary care provider regarding development and update of comprehensive plan of care as evidenced by provider attestation and co-signature ?Inter-disciplinary care team collaboration (see longitudinal plan of care) ?Evaluation of current treatment plan related to  self management and patient's adherence to plan as established by provider ? ? ?Diabetes Interventions:   ?Assessed patient's understanding of A1c goal: <7% ?Lab Results  ?Component Value Date  ? HGBA1C 7.5 (H) 07/05/2021  ?Reviewed diabetes treatment plan. Reports excellent compliance with medications. ?Reviewed blood glucose readings. Reports one elevated fasting reading in the 180's. Unsure if it was due to meal and snacks the evening prior. Reports most fasting readings have range from the 120's to low 140's.  ?Reviewed s/sx of hypoglycemia and hyperglycemia. Denies episodes.  ?Discussed nutritional intake. Reports diet has improved. Still attempting to reduce intake of processed foods and added sugars. Remains motivated to achieve optimal glycemic  control. Denies current need for additional diabetic resources. Agreed to update the team if nutritional assistance or additional diabetic education is required. ? ? ?Hyperlipidemia Interventions:   ?Lab Results  ?Component Value Date  ? CHOL 128 07/05/2021  ? HDL 36 (L) 07/05/2021  ? [place] 79 07/05/2021  ? TRIG 63 07/05/2021  ? CHOLHDL 5.3 (H) 04/20/2019  ?  ?Medications reviewed  ?Reviewed provider established cholesterol goals  ?Discussed importance of completing regular laboratory monitoring as prescribed ?Reviewed role and benefits of statin for ASCVD risk reduction ?Reviewed strategies to manage statin-induced myalgias. Reports tolerating statin well ?Reviewed importance of limiting foods high in cholesterol ?Reviewed exercise goals. Advised to engage in regular low impact exercises/activities as tolerated. ? ?Hypertension Interventions:   ?Last practice recorded BP readings:  ?BP Readings from Last 3 Encounters:  ?07/17/21 110/76  ?07/05/21 113/77  ?04/19/21 122/79  ?Most recent eGFR/CrCl:  ?Lab Results  ?Component Value Date  ? EGFR 52 (L) 07/05/2021  ?  No components found for: CRCL ? ?Reviewed plan for hypertension management. ?Reviewed established blood pressure parameters. Reports not monitoring BP at home. Recent clinic readings have been stable.  Advised to monitor BP a few times a week if unable to monitor daily and record readings. Reviewed indications for notifying a provider. ?Reviewed importance of adhering to a cardiac prudent/heart  healthy diet. Encouraged to read nutrition labels, limit sodium intake, and avoid highly processed foods when possible. ?Reviewed s/sx of heart attack, stroke and worsening symptoms that require immediate medical attention. ? ? ?Cutaneous Abcess Interventions: ?Discussed plan for treatment of left elbow cutaneous abscess. She was evaluated for this issue on 07/17/2021. Report taking antibiotic as prescribed. Reports range of motion to her left elbow has improved since  being evaluated in the clinic. Also notes the area is less painful. Notes minimal edema to the area. Denies drainage or discoloration. Denies fevers. Reports the area is improving. Declined need fo

## 2021-07-27 ENCOUNTER — Telehealth: Payer: Self-pay

## 2021-07-27 NOTE — Telephone Encounter (Signed)
Patient did not request this and provider did not order. Patient has been advised that we won't fill out the form ?

## 2021-07-27 NOTE — Progress Notes (Signed)
? ? ?Chronic Care Management ?Pharmacy Assistant  ? ?Name: Emily Stokes  MRN: [id number] DOB: February 29, 1961 ? ?Reason for Encounter: Medication Review/Medication Coordination Call for [facility] ?  ?Recent office visits:  ?07/17/2021 Talitha Givens, PA-C (PCP Office Visit) for Cyst- Started: Doxycycline Hyclate 100 mg twice daily, No orders placed, No follow-up noted ? ?Recent consult visits:  ?None ID ? ?Hospital visits:  ?None in previous 6 months ? ?Medications: ?Outpatient Encounter Medications as of 07/27/2021  ?Medication Sig  ? acetaminophen (TYLENOL) 500 MG tablet Take 500 mg by mouth every 6 (six) hours as needed.  ? amLODipine (NORVASC) 10 MG tablet Take 1 tablet (10 mg total) by mouth daily.  ? aspirin 81 MG EC tablet Take 1 tablet (81 mg total) by mouth daily. Swallow whole.  ? atorvastatin (LIPITOR) 20 MG tablet Take 1 tablet (20 mg total) by mouth daily.  ? Blood Glucose Monitoring Suppl (ACCU-CHEK GUIDE ME) w/Device KIT   ? Dulaglutide (TRULICITY) 3 JO/8.7OM SOPN Inject 3 mg as directed once a week.  ? gabapentin (NEURONTIN) 300 MG capsule Take 1 capsule (300 mg total) by mouth 3 (three) times daily.  ? Ibuprofen-diphenhydrAMINE Cit (IBUPROFEN PM PO) Take by mouth at bedtime as needed.  ? Lancet Devices (EASY MINI EJECT LANCING DEVICE) MISC   ? Lancets (ONETOUCH DELICA PLUS [id number]) MISC USE AS DIRECTED TWICE DAILY IN THE MORNING AND AT BEDTIME  ? metFORMIN (GLUCOPHAGE) 500 MG tablet Take 2 tablets (1,000 mg total) by mouth 2 (two) times daily with a meal.  ? metoprolol tartrate (LOPRESSOR) 25 MG tablet Take 1 tablet (25 mg total) by mouth 2 (two) times daily.  ? ?No facility-administered encounter medications on file as of 07/27/2021.  ? ?Care Gaps: ?Tetanus/TDAP ?Zoster Vacciens ?COVID-19 Vaccine Booster 3 ? ?Star Rating Drugs: ?Atorvastatin 20 mg last filled on 07/03/2021 for a 30-Day supply with [facility] ?Metformin 500 mg last filled on 02/21/203 for a 30-Day supply with [facility] ?Trulicity 3 mg last filled on 07/03/2021 for 30-Day supply with [facility] ? ?BP Readings from Last 3 Encounters:  ?07/17/21 110/76  ?07/05/21 113/77  ?04/19/21 122/79  ?  ?Lab Results  ?Component Value Date  ? HGBA1C 7.5 (H) 07/05/2021  ?  ?Patient obtains medications through Adherence Packaging  30 Days  ? ?Last adherence delivery included:  ?Gabapentin 300 mg 1 capsule three times daily (Breakfast, Lunch, Evening Meals) ?Metformin 500 mg 2 tablets twice daily (Breakfast, Bedtime) ?Aspirin 81 mg 1 tablet daily (Breakfast) ?Metoprolol Tartrate 25 mg 1 tablet twice daily (Breakfast, Bedtime) ?Amlodipine 5 mg 2 tablets daily (Breakfast) ?Atorvastatin 20 mg 1 tablet daily (Breakfast) ?Trulicity Inject 1.5 mg into the skin once a week ? ?Patient declined medications last month: ?Patient wasn't reachable last month, and [facility] contacted her directly. ? ?Patient is due for next adherence delivery on: 08/08/2021 (Wednesday) 2nd Route after 3 pm. ? ?Called patient and reviewed medications and coordinated delivery. ? ?This delivery to include: ?Amlodipine 10 mg 1 tablet daily (Breakfast) ?Atorvastatin 20 mg 1 tablet daily (Breakfast) ?Metformin 500 mg 2 tablets twice daily (Breakfast, Bedtime) ?Aspirin 81 mg 1 tablet daily (Breakfast) ?Metoprolol Tartrate 25 mg 1 tablet twice daily (Breakfast, Bedtime) ?Gabapentin 300 mg 1 capsule three times daily (Breakfast, Lunch, Evening Meals) ?Trulicity Inject 1.5 mg into the skin once a week ? ?Patient declined the following medications: ? ?Patient needs refills for: ?No Refills needed for this order ? ?Patient has telephone appointment with Junius Argyle, CPP on  10/02/2021 @ 0830 ? ?Confirmed delivery date of 08/08/2021 2nd Route, advised patient that pharmacy will contact them the morning of delivery. ? ? ?[name], CPA/CMA ?Clinical Pharmacist Assistant ?Phone: [phone]  ? ?

## 2021-08-01 NOTE — Telephone Encounter (Signed)
Emily Stokes called back in for an update, but requested a fax to be sent over with the information that the pt doesn't want to further continue, please advise.  ?

## 2021-08-02 NOTE — Telephone Encounter (Signed)
[facility] has been called and notified that we did not order this and the patient states she did not request them. ?

## 2021-08-06 ENCOUNTER — Other Ambulatory Visit: Payer: Self-pay | Admitting: *Deleted

## 2021-08-06 DIAGNOSIS — M5416 Radiculopathy, lumbar region: Secondary | ICD-10-CM

## 2021-08-06 DIAGNOSIS — G894 Chronic pain syndrome: Secondary | ICD-10-CM

## 2021-08-06 DIAGNOSIS — M47816 Spondylosis without myelopathy or radiculopathy, lumbar region: Secondary | ICD-10-CM

## 2021-08-06 MED ORDER — GABAPENTIN 300 MG PO CAPS: 300.0000 mg | ORAL_CAPSULE | Freq: Three times a day (TID) | ORAL | 2 refills | Status: DC

## 2021-08-10 DIAGNOSIS — E1169 Type 2 diabetes mellitus with other specified complication: Secondary | ICD-10-CM

## 2021-08-10 DIAGNOSIS — I152 Hypertension secondary to endocrine disorders: Secondary | ICD-10-CM

## 2021-08-10 DIAGNOSIS — E1165 Type 2 diabetes mellitus with hyperglycemia: Secondary | ICD-10-CM

## 2021-08-10 DIAGNOSIS — E785 Hyperlipidemia, unspecified: Secondary | ICD-10-CM

## 2021-08-10 DIAGNOSIS — E1159 Type 2 diabetes mellitus with other circulatory complications: Secondary | ICD-10-CM

## 2021-08-27 ENCOUNTER — Telehealth: Payer: Self-pay

## 2021-08-27 DIAGNOSIS — E1165 Type 2 diabetes mellitus with hyperglycemia: Secondary | ICD-10-CM

## 2021-08-27 MED ORDER — ACCU-CHEK GUIDE VI STRP: ORAL_STRIP | 12 refills | Status: DC

## 2021-08-27 MED ORDER — ACCU-CHEK SOFTCLIX LANCETS MISC: 12 refills | Status: DC

## 2021-08-27 NOTE — Progress Notes (Signed)
? ? ?Chronic Care Management ?Pharmacy Assistant  ? ?Name: Emily Stokes  MRN: [id number] DOB: 11-18-1960 ? ?Reason for Encounter: Medication Review/Medication Coordination Call for [facility] ?  ?Recent office visits:  ?None ID ? ?Recent consult visits:  ?None  ID ? ?Hospital visits:  ?None in previous 6 months ? ?Medications: ?Outpatient Encounter Medications as of 08/27/2021  ?Medication Sig  ? acetaminophen (TYLENOL) 500 MG tablet Take 500 mg by mouth every 6 (six) hours as needed.  ? amLODipine (NORVASC) 10 MG tablet Take 1 tablet (10 mg total) by mouth daily.  ? aspirin 81 MG EC tablet Take 1 tablet (81 mg total) by mouth daily. Swallow whole.  ? atorvastatin (LIPITOR) 20 MG tablet Take 1 tablet (20 mg total) by mouth daily.  ? Blood Glucose Monitoring Suppl (ACCU-CHEK GUIDE ME) w/Device KIT   ? Dulaglutide (TRULICITY) 3 RW/4.3XV SOPN Inject 3 mg as directed once a week.  ? gabapentin (NEURONTIN) 300 MG capsule Take 1 capsule (300 mg total) by mouth 3 (three) times daily.  ? Ibuprofen-diphenhydrAMINE Cit (IBUPROFEN PM PO) Take by mouth at bedtime as needed.  ? Lancet Devices (EASY MINI EJECT LANCING DEVICE) MISC   ? Lancets (ONETOUCH DELICA PLUS [id number]) MISC USE AS DIRECTED TWICE DAILY IN THE MORNING AND AT BEDTIME  ? metFORMIN (GLUCOPHAGE) 500 MG tablet Take 2 tablets (1,000 mg total) by mouth 2 (two) times daily with a meal.  ? metoprolol tartrate (LOPRESSOR) 25 MG tablet Take 1 tablet (25 mg total) by mouth 2 (two) times daily.  ? ?No facility-administered encounter medications on file as of 08/27/2021.  ? ?Care Gaps: ?Tetanus/TDAP ?Zoster Vacciens ?COVID-19 Vaccine Booster 3 ?Diabetic Eye Exam ?Mammogram ? ?Star Rating Drugs: ?Atorvastatin 20 mg last filled on 08/02/2021 for a 30-Day supply with [facility] ?Metformin 500 mg last filled on 03/23/203 for a 30-Day supply with [facility] ?Trulicity 3 mg last filled on 08/02/2021 for 30-Day supply with [facility] ? ?BP Readings from  Last 3 Encounters:  ?07/17/21 110/76  ?07/05/21 113/77  ?04/19/21 122/79  ?  ?Lab Results  ?Component Value Date  ? HGBA1C 7.5 (H) 07/05/2021  ?  ?Patient obtains medications through Adherence Packaging  30 Days  ? ?Last adherence delivery included:  ?Amlodipine 10 mg 1 tablet daily (Breakfast) ?Atorvastatin 20 mg 1 tablet daily (Breakfast) ?Metformin 500 mg 2 tablets twice daily (Breakfast, Bedtime) ?Aspirin 81 mg 1 tablet daily (Breakfast) ?Metoprolol Tartrate 25 mg 1 tablet twice daily (Breakfast, Bedtime) ?Gabapentin 300 mg 1 capsule three times daily (Breakfast, Lunch, Evening Meals) ?Trulicity Inject 3 mg into the skin once a week ? ?Patient declined medications last month: ?No medications were declined last month ? ?Patient is due for next adherence delivery on: 09/06/2021 (Thursday) 1st Route . ? ?Called patient and reviewed medications and coordinated delivery. ? ?This delivery to include: ?Amlodipine 10 mg 1 tablet daily (Breakfast) ?Atorvastatin 20 mg 1 tablet daily (Breakfast) ?Metformin 500 mg 2 tablets twice daily (Breakfast, Bedtime) ?Aspirin 81 mg 1 tablet daily (Breakfast) ?Metoprolol Tartrate 25 mg 1 tablet twice daily (Breakfast, Bedtime) ?Gabapentin 300 mg 1 capsule three times daily (Breakfast, Lunch, Evening Meals) ?Trulicity Inject 3 mg into the skin once a week- [facility] is now getting 3 mg in small quantities, and will send the increase this month of 3 mg. CPP has been notified requesting next steps if they don't get the 3 mg in for next month.  ? Lancets and Test Strips ? ?Patient declined the following medications: ?  No medications were declined ? ?Patient needs refills for: ?Lancets and test strips needed. CPP can send in refill ? ?Confirmed delivery date of 09/06/2021 1st Route, advised patient that pharmacy will contact them the morning of delivery. ? ?Patient has a scheduled telephone appointment with Junius Argyle, CPP on 10/02/2021 @ 0830. ? ?[name], CPA/CMA ?Clinical Pharmacist  Assistant ?Phone: [phone]  ? ? ?

## 2021-08-27 NOTE — Addendum Note (Signed)
Addended by: Daron Offer A on: 08/27/2021 11:42 AM ? ? Modules accepted: Orders ? ?

## 2021-08-28 ENCOUNTER — Telehealth: Payer: Self-pay | Admitting: Family Medicine

## 2021-08-28 NOTE — Telephone Encounter (Signed)
[facility] faxed over a RX request for diabetic footwear on the 10th and again today as well / they inquired about when they can receive this back / please advise  ?

## 2021-08-29 NOTE — Telephone Encounter (Signed)
Spoke to patient again and she denies requesting anything from this pharmacy. [facility] advised that patient did not request any thing from them.  ?

## 2021-09-25 ENCOUNTER — Other Ambulatory Visit: Payer: Self-pay | Admitting: Family Medicine

## 2021-09-25 DIAGNOSIS — I10 Essential (primary) hypertension: Secondary | ICD-10-CM

## 2021-09-25 DIAGNOSIS — E119 Type 2 diabetes mellitus without complications: Secondary | ICD-10-CM

## 2021-09-26 ENCOUNTER — Telehealth: Payer: Self-pay

## 2021-09-26 NOTE — Progress Notes (Signed)
Chronic Care Management Pharmacy Assistant   Name: Emily Stokes  MRN: [id number] DOB: 1961-01-26  Reason for Encounter:  Medication Review/Medication Coordination Call for [facility]   Recent office visits:  None ID  Recent consult visits:  None ID  Hospital visits:  None in previous 6 months  Medications: Outpatient Encounter Medications as of 09/26/2021  Medication Sig   Accu-Chek Softclix Lancets lancets Use to monitor blood sugars once daily as instructed   acetaminophen (TYLENOL) 500 MG tablet Take 500 mg by mouth every 6 (six) hours as needed.   amLODipine (NORVASC) 10 MG tablet Take 1 tablet (10 mg total) by mouth daily.   aspirin 81 MG EC tablet Take 1 tablet (81 mg total) by mouth daily. Swallow whole.   atorvastatin (LIPITOR) 20 MG tablet Take 1 tablet (20 mg total) by mouth daily.   Blood Glucose Monitoring Suppl (ACCU-CHEK GUIDE ME) w/Device KIT    Dulaglutide (TRULICITY) 3 JT/7.0VX SOPN Inject 3 mg as directed once a week.   gabapentin (NEURONTIN) 300 MG capsule Take 1 capsule (300 mg total) by mouth 3 (three) times daily.   glucose blood (ACCU-CHEK GUIDE) test strip Use to monitor blood sugars once daily as instructed   Ibuprofen-diphenhydrAMINE Cit (IBUPROFEN PM PO) Take by mouth at bedtime as needed.   Lancet Devices (EASY MINI EJECT LANCING DEVICE) MISC    metFORMIN (GLUCOPHAGE) 500 MG tablet Take 2 tablets (1,000 mg total) by mouth 2 (two) times daily with a meal.   metoprolol tartrate (LOPRESSOR) 25 MG tablet Take 1 tablet (25 mg total) by mouth 2 (two) times daily.   No facility-administered encounter medications on file as of 09/26/2021.   Care Gaps: Tetanus/TDAP Zoster Vacciens COVID-19 Vaccine Booster 3 Diabetic Eye Exam Mammogram  Star Rating Drugs: Atorvastatin 20 mg last filled on 08/02/2021 for a 30-Day supply with [facility] Metformin 500 mg last filled on 03/23/203 for a 30-Day supply with [facility] Trulicity 3 mg  last filled on 08/02/2021 for 30-Day supply with [facility]  BP Readings from Last 3 Encounters:  07/17/21 110/76  07/05/21 113/77  04/19/21 122/79    Lab Results  Component Value Date   HGBA1C 7.5 (H) 07/05/2021    Patient obtains medications through Adherence Packaging  30 Days   Last adherence delivery included:  Amlodipine 10 mg 1 tablet daily (Breakfast) Atorvastatin 20 mg 1 tablet daily (Breakfast) Metformin 500 mg 2 tablets twice daily (Breakfast, Bedtime) Aspirin 81 mg 1 tablet daily (Breakfast) Metoprolol Tartrate 25 mg 1 tablet twice daily (Breakfast, Bedtime) Gabapentin 300 mg 1 capsule three times daily (Breakfast, Lunch, Evening Meals) Trulicity Inject 3 mg into the skin once a week- [facility] is now getting 3 mg in small quantities, and will send the increase this month of 3 mg. CPP has been notified requesting next steps if they don't get the 3 mg in for next month.   Lancets and Test Strips  Patient declined the following medications: No medications were declined  Patient is due for next adherence delivery on: 10/08/2021 (Monday) 2nd Route after 3 pm.  Called patient and reviewed medications and coordinated delivery.  This delivery to include: Amlodipine 10 mg 1 tablet daily (Breakfast) Atorvastatin 20 mg 1 tablet daily (Breakfast) Metformin 500 mg 2 tablets twice daily (Breakfast, Bedtime) Aspirin 81 mg 1 tablet daily (Breakfast) Metoprolol Tartrate 25 mg 1 tablet twice daily (Breakfast, Bedtime) Gabapentin 300 mg 1 capsule three times daily (Breakfast, Lunch, Evening Meals) Trulicity Inject 3  mg into the skin once a week  Patient declined the following medications:  Patient needs refills for Aspirin Low Dose 81 mg and Metoprolol 25 mg both are PCP medications and it appears PCP has already sent refills for these .  Confirmed delivery date of 10/08/2021 2nd Route, advised patient that pharmacy will contact them the morning of delivery.  Patient has  a follow-up telephone appointment with Junius Argyle, CPP on 10/02/2021 @ 0830.  05/17 Left HIPAA compliant voicemail requesting patient to return my call 05/18 Left HIPAA compliant voicemail requesting patient to return my call 05/19 Left HIPAA compliant voicemail requesting patient to return my call  Unable to reach patient to completed Medication Coordination form. Form was completed based on last month delivery. [facility] will contact patient to confirm delivery. Junius Argyle, CPP was notified I was unable to reach patient  [name], [place] Pharmacist Assistant Phone: [phone]

## 2021-09-28 ENCOUNTER — Telehealth: Payer: 59

## 2021-09-28 ENCOUNTER — Telehealth: Payer: Self-pay

## 2021-09-28 NOTE — Telephone Encounter (Signed)
  Care Management   Follow Up Note   09/28/2021 Name: Emily Stokes MRN: [id number] DOB: Nov 02, 1960  Primary Care Provider: Gwyneth Sprout, FNP Reason for referral : Chronic Care Management   An unsuccessful telephone outreach was attempted today. The patient was referred to the case management team for assistance with care management and care coordination.    Follow Up Plan:  A HIPAA compliant voice message was left today requesting a return call.   [name] Care Management [facility] [phone]

## 2021-10-01 ENCOUNTER — Telehealth: Payer: Self-pay

## 2021-10-01 DIAGNOSIS — E119 Type 2 diabetes mellitus without complications: Secondary | ICD-10-CM

## 2021-10-01 DIAGNOSIS — I10 Essential (primary) hypertension: Secondary | ICD-10-CM

## 2021-10-01 MED ORDER — METOPROLOL TARTRATE 25 MG PO TABS: 25.0000 mg | ORAL_TABLET | Freq: Two times a day (BID) | ORAL | 1 refills | Status: DC

## 2021-10-01 MED ORDER — ASPIRIN 81 MG PO TBEC: DELAYED_RELEASE_TABLET | ORAL | 2 refills | Status: DC

## 2021-10-01 NOTE — Progress Notes (Signed)
    Chronic Care Management Pharmacy Assistant   Name: Emily Stokes  MRN: [id number] DOB: Jul 24, 1960  Patient called to be reminded of her telephone appointment with Junius Argyle, CPP on 10/02/2021 @ 0830.  Patient aware of appointment date, time, and type of appointment (either telephone or in person). Patient aware to have/bring all medications, supplements, blood pressure and/or blood sugar logs to visit.  Star Rating Drug: Atorvastatin 20 mg last filled on 08/02/2021 for a 30-Day supply with [facility] Metformin 500 mg last filled on 03/23/203 for a 30-Day supply with [facility] Trulicity 3 mg last filled on 08/02/2021 for 30-Day supply with [facility]  Any gaps in medications fill history? No  Care Gaps: Tetanus/TDAP Zoster Vacciens COVID-19 Vaccine Booster 3 Diabetic Eye Exam Mammogram  [name], CPA/CMA Clinical Pharmacist Assistant Phone: [phone]

## 2021-10-01 NOTE — Addendum Note (Signed)
Addended by: Daron Offer A on: 10/01/2021 04:18 PM   Modules accepted: Orders

## 2021-10-02 ENCOUNTER — Ambulatory Visit: Payer: Medicare HMO

## 2021-10-02 DIAGNOSIS — E1165 Type 2 diabetes mellitus with hyperglycemia: Secondary | ICD-10-CM

## 2021-10-02 DIAGNOSIS — E1169 Type 2 diabetes mellitus with other specified complication: Secondary | ICD-10-CM

## 2021-10-02 NOTE — Progress Notes (Signed)
Chronic Care Management Pharmacy Note  10/02/2021 Name:  Emily Stokes MRN:  [id number] DOB:  12-11-1960  Summary: Patient presents for CCM follow-up. Blood pressure and blood sugars significantly improved.   Recommendations/Changes made from today's visit: Continue current medications Patient to schedule follow-up with PCP, pain management, and mammogram.   Plan: CPP follow-up in 3 months  Subjective: Emily Stokes is an 61 y.o. year old female who is a primary patient of Gwyneth Sprout, FNP.  The CCM team was consulted for assistance with disease management and care coordination needs.    Engaged with patient by telephone for follow up visit in response to provider referral for pharmacy case management and/or care coordination services.   Consent to Services:  The patient was given information about Chronic Care Management services, agreed to services, and gave verbal consent prior to initiation of services.  Please see initial visit note for detailed documentation.   Patient Care Team: Gwyneth Sprout, FNP as PCP - General (Family Medicine) Pa, [place] (Optometry) Gillis Santa, MD as Consulting Physician (Pain Medicine) Neldon Labella, RN as Case Manager Germaine Pomfret, [facility] (Pharmacist)  Recent office visits:  07/17/21: Patient presented to [facility], PA-C for cutaneous abscess.  07/05/21: Patient presented to [name], FNP for follow-up.  04/19/21: Patient presented to [facility], PA-C for UTI. Macrobid.    Recent consult visits: None in past 6 months.   Hospital visits: None in past 6 months.   Objective:  Lab Results  Component Value Date   CREATININE 1.19 (H) 07/05/2021   BUN 16 07/05/2021   GFRNONAA 55 (L) 06/06/2020   GFRAA 63 06/06/2020   NA 146 (H) 07/05/2021   K 4.2 07/05/2021   CALCIUM 9.4 07/05/2021   CO2 22 07/05/2021   GLUCOSE 126 (H) 07/05/2021    Lab Results  Component Value Date/Time   HGBA1C 7.5 (H) 07/05/2021 09:29 AM    HGBA1C 10.9 (A) 01/29/2021 09:26 AM   HGBA1C 14.9 (H) 06/06/2020 10:44 AM   MICROALBUR 50 06/06/2020 10:48 AM   MICROALBUR 20 01/30/2018 04:54 PM    Last diabetic Eye exam:  Lab Results  Component Value Date/Time   HMDIABEYEEXA No Retinopathy 09/15/2019 12:00 AM    Last diabetic Foot exam: No results found for: HMDIABFOOTEX   Lab Results  Component Value Date   CHOL 128 07/05/2021   HDL 36 (L) 07/05/2021   LDLCALC 79 07/05/2021   TRIG 63 07/05/2021   CHOLHDL 5.3 (H) 04/20/2019       Latest Ref Rng & Units 07/05/2021    9:29 AM 06/06/2020   10:44 AM 07/28/2019    8:25 AM  Hepatic Function  Total Protein 6.0 - 8.5 g/dL 7.6   7.4   7.4    Albumin 3.8 - 4.8 g/dL 4.1   4.0   4.4    AST 0 - 40 IU/L _0 ALT 0 - 32 IU/L _1 Alk Phosphatase 44 - 121 IU/L 103   116   114    Total Bilirubin 0.0 - 1.2 mg/dL 0.4   0.4   0.4      Lab Results  Component Value Date/Time   TSH 1.500 07/28/2019 08:25 AM   TSH 1.600 11/06/2018 11:10 AM       Latest Ref Rng & Units 07/28/2019    8:25 AM 04/20/2019    9:19  AM 11/06/2018   11:10 AM  CBC  WBC 3.4 - 10.8 x10E3/uL 6.0   5.3   5.7    Hemoglobin 11.1 - 15.9 g/dL 11.7   11.1   11.6    Hematocrit 34.0 - 46.6 % 35.1   35.6   34.3    Platelets 150 - 450 x10E3/uL 374   396   358      No results found for: VD25OH  Clinical ASCVD: No  The ASCVD Risk score (Arnett DK, et al., 2019) failed to calculate for the following reasons:   The valid total cholesterol range is 130 to 320 mg/dL       07/17/2021    9:48 AM 07/05/2021    8:56 AM 05/09/2021    1:39 PM  Depression screen PHQ 2/9  Decreased Interest 0 0 0  Down, Depressed, Hopeless 0 0 0  PHQ - 2 Score 0 0 0  Altered sleeping 0 0   Tired, decreased energy 2 2   Change in appetite 3 1   Feeling bad or failure about yourself  0 0   Trouble concentrating 0 0   Moving slowly or fidgety/restless 0 0   Suicidal thoughts 0 0   PHQ-9 Score 5 3   Difficult doing  work/chores Not difficult at all Extremely dIfficult     Social History   Tobacco Use  Smoking Status Never  Smokeless Tobacco Never   BP Readings from Last 3 Encounters:  07/17/21 110/76  07/05/21 113/77  04/19/21 122/79   Pulse Readings from Last 3 Encounters:  07/17/21 97  07/05/21 80  04/19/21 82   Wt Readings from Last 3 Encounters:  07/17/21 220 lb 3.2 oz (99.9 kg)  07/05/21 224 lb (101.6 kg)  04/19/21 224 lb (101.6 kg)   BMI Readings from Last 3 Encounters:  07/17/21 32.52 kg/m  07/05/21 33.08 kg/m  04/19/21 33.08 kg/m    Assessment/Interventions: Review of patient past medical history, allergies, medications, health status, including review of consultants reports, laboratory and other test data, was performed as part of comprehensive evaluation and provision of chronic care management services.   SDOH:  (Social Determinants of Health) assessments and interventions performed: Yes      SDOH Screenings   Alcohol Screen: Low Risk    Last Alcohol Screening Score (AUDIT): 0  Depression (PHQ2-9): Medium Risk   PHQ-2 Score: 5  Financial Resource Strain: Medium Risk   Difficulty of Paying Living Expenses: Somewhat hard  Food Insecurity: No Food Insecurity   Worried About Charity fundraiser in the Last Year: Never true   Ran Out of Food in the Last Year: Never true  Housing: Low Risk    Last Housing Risk Score: 0  Physical Activity: Inactive   Days of Exercise per Week: 0 days   Minutes of Exercise per Session: 0 min  Social Connections: Moderately Isolated   Frequency of Communication with Friends and Family: More than three times a week   Frequency of Social Gatherings with Friends and Family: Once a week   Attends Religious Services: More than 4 times per year   Active Member of [employer] or Organizations: No   Attends Archivist Meetings: Never   Marital Status: Widowed  Stress: No Stress Concern Present   Feeling of Stress : Not at all   Tobacco Use: Low Risk    Smoking Tobacco Use: Never   Smokeless Tobacco Use: Never   Passive Exposure: Not on file  Transportation  Needs: No Transportation Needs   Lack of Transportation (Medical): No   Lack of Transportation (Non-Medical): No    CCM Care Plan  Allergies  Allergen Reactions   Bactrim [Sulfamethoxazole-Trimethoprim] Swelling    Medications Reviewed Today     Reviewed by Neldon Labella, RN (Registered Nurse) on 07/26/21 at 1526  Med List Status: <None>   Medication Order Taking? Sig Documenting Provider Last Dose Status Informant  acetaminophen (TYLENOL) 500 MG tablet [id number] No Take 500 mg by mouth every 6 (six) hours as needed. [provider] Taking Active   amLODipine (NORVASC) 10 MG tablet [id number] No Take 1 tablet (10 mg total) by mouth daily. Gwyneth Sprout, FNP Taking Active   aspirin 81 MG EC tablet [id number] No Take 1 tablet (81 mg total) by mouth daily. Swallow whole. Birdie Sons, MD Taking Active   atorvastatin (LIPITOR) 20 MG tablet [id number] No Take 1 tablet (20 mg total) by mouth daily. Gwyneth Sprout, FNP Taking Active   Blood Glucose Monitoring Suppl (ACCU-CHEK GUIDE ME) w/Device KIT [id number] No  [provider] Taking Active   Dulaglutide (TRULICITY) 3 JO/8.7OM SOPN [id number] No Inject 3 mg as directed once a week. Gwyneth Sprout, FNP Taking Active   gabapentin (NEURONTIN) 300 MG capsule [id number] No Take 1 capsule (300 mg total) by mouth 3 (three) times daily. Gillis Santa, MD Taking Active   Ibuprofen-diphenhydrAMINE Cit (IBUPROFEN PM PO) [id number] No Take by mouth at bedtime as needed. [provider] Taking Active   Lancet Devices (EASY MINI EJECT LANCING DEVICE) MISC [id number] No  [provider] Taking Active   Lancets [facility] DELICA PLUS [id number]) [place] [id number] No USE AS DIRECTED TWICE DAILY IN THE MORNING AND AT BEDTIME Chrismon, [name], PA-C Taking Active   metFORMIN (GLUCOPHAGE) 500 MG  tablet [id number] No Take 2 tablets (1,000 mg total) by mouth 2 (two) times daily with a meal. Birdie Sons, MD Taking Active   metoprolol tartrate (LOPRESSOR) 25 MG tablet [id number] No Take 1 tablet (25 mg total) by mouth 2 (two) times daily. Birdie Sons, MD Taking Active   Med List Note Landis Martins, RN 07/13/20 0300): UDS 10-21-19 MR 09/11/2020 Opiod contracts signed 02/12/18 07-13-20 PA request for Tramadol submitted. Key [id number]  dw            Patient Active Problem List   Diagnosis Date Noted   Need for influenza vaccination 07/05/2021   Hyperlipidemia associated with type 2 diabetes mellitus ([place]) 07/05/2021   Diabetic eye exam ([place]) 07/05/2021   Hypertension associated with diabetes ([place]) 07/05/2021   Need for shingles vaccine 07/05/2021   Ataxic gait 07/05/2021   Discoloration and thickening of nails both feet 07/05/2021   Blurred vision, bilateral 07/05/2021   Spinal stenosis, lumbar region, with neurogenic claudication 04/27/2020   Personal history of colonic polyps    Polyp of transverse colon    Lumbar facet arthropathy 03/18/2018   Lumbar spondylosis 03/18/2018   Encounter for screening colonoscopy    Internal hemorrhoids    Benign neoplasm of descending colon    Benign neoplasm of cecum    Type 2 diabetes mellitus with hyperglycemia, without long-term current use of insulin ([place]) 07/31/2017   Recurrent abdominal hernia without obstruction or gangrene 07/24/2017   Chronic pain syndrome 05/30/2016   Nasal septal abscess 05/30/2016   Lumbar degenerative disc disease 09/14/2015   Facet syndrome, lumbar 09/14/2015   Lumbar radiculopathy 09/14/2015   Sacroiliac joint dysfunction 09/14/2015  History of chicken pox 02/02/2015   Hypercholesteremia 02/02/2015   Blood glucose elevated 02/02/2015   Pain in soft tissues of limb 02/02/2015   Nonspecific ST-T changes 02/02/2015   Awareness of heartbeats 02/02/2015   Pain in extremity at multiple sites  02/02/2015   Snores 02/02/2015   Hernia of anterior abdominal wall 02/24/2012   Adnexal mass 10/30/2011   Pelvic cyst 10/30/2011   Palpitations 03/14/2011   Abnormal finding on EKG 03/14/2011   Obesity 03/14/2011   Urinary system disease 08/10/2008   Arthralgia of lower leg 03/28/2008   Anemia, iron deficiency 06/17/2007   Calculus of kidney 05/26/2007    Immunization History  Administered Date(s) Administered   Influenza Split 05/26/2007   Influenza,inj,Quad PF,6+ Mos 05/31/2016, 05/19/2017, 01/30/2018, 02/11/2020, 07/05/2021   PFIZER(Purple Top)SARS-COV-2 Vaccination 08/05/2019, 08/31/2019   Pneumococcal Polysaccharide-23 01/30/2018    Conditions to be addressed/monitored:  Hypertension, Hyperlipidemia, Diabetes, and Chronic Pain  Care Plan : General Pharmacy (Adult)  Updates made by Germaine Pomfret, [place] since 10/02/2021 12:00 AM     Problem: Hypertension, Hyperlipidemia, Diabetes, and Chronic Pain   Priority: High     Long-Range Goal: Patient-Specific Goal   Start Date: 03/27/2021  Expected End Date: 03/27/2022  This Visit's Progress: On track  Recent Progress: On track  Priority: High  Note:   Current Barriers:  Unable to achieve control of diabetes   Pharmacist Clinical Goal(s):  Patient will maintain control of diabetes as evidenced by A1c less than 7%  through collaboration with PharmD and provider.   Interventions: 1:1 collaboration with Gwyneth Sprout, FNP regarding development and update of comprehensive plan of care as evidenced by provider attestation and co-signature Inter-disciplinary care team collaboration (see longitudinal plan of care) Comprehensive medication review performed; medication list updated in electronic medical record  Hypertension (BP goal <130/80) -Controlled: Not addressed during this visit. -Current treatment: Amlodipine 10 mg daily: Appropriate, Effective, Safe, Accessible Metoprolol 25 mg twice daily: Query  appropriate -Medications previously tried: NA  -Current home readings: Does not monitor at home -Denies hypotensive/hypertensive symptoms -Questionably appropriate use of beta blocker given lack of clear indication. Will address at future visit. -Given borderline CKD, patient may benefit from ARB in place of beta blocker. Will defer for now.  -Recommended to continue current medication  Hyperlipidemia: (LDL goal < 70) -Uncontrolled, but improved.  -Current treatment: Atorvastatin 20 mg daily  -Current treatment: Aspirin 81 mg daily  -Medications previously tried: NA  -Patient close to goal, but LDL still slightly elevated. Will wait for now  -Continue current medications  Diabetes (A1c goal <7%) -Uncontrolled, but greatly approved.  -Diagnosed 2018 -Current medications: Metformin 500 mg 2 tablets twice daily: Appropriate, Effective, Safe, Accessible Trulicity 3 mg weekly on tuesdays: Appropriate, Effective, Safe, Accessible  -Medications previously tried: NA  -Current home glucose readings:  Fasting: 107, 109, 117, 118. Highest in recall 130.  -Reports  hypoglycemic symptoms: shakiness when blood sugar was 87.    -Current meal patterns: Significantly reduced appetite since starting Trulicity. Salads 2-3x weekly. Drinks mainly water. Snacks peanuts.  -Current exercise: Unable walk due to leg pain.  -Continue current medications.   Chronic Pain (Goal: Minimize pain symptoms) -Not ideally controlled: Not addressed during this visit. -Managed by Dr. Holley Raring -Current treatment  Gabapentin 300 mg three times daily  -Medications previously tried: NA  -Recommended scheduling follow-up with pain management.  -Recommended to continue current medication  Patient Goals/Self-Care Activities Patient will:  - check glucose daily before breakfast, document, and  provide at future appointments  Follow Up Plan: Telephone follow up appointment with care management team member scheduled for:   01/04/2022 at 8:30 AM    Medication Assistance: None required.  Patient affirms current coverage meets needs.  Compliance/Adherence/Medication fill history: Care Gaps: Zoster Vaccines PNA Vaccine COVID-19 Vaccine Booster 3 Diabetic Eye Exam Influenza Vaccine  Star-Rating Drugs: Atorvastatin 20 mg last filled on 08/31/2021 for a 30-Day supply with [facility] Metformin 500 mg  last filled on 08/31/2021 for a 30-Day supply with [facility] Trulicity 1.5 mg  last filled on 08/31/2021 for a 30-Day supply with [facility]   Patient's preferred pharmacy is:  [facility] - [place], [place] - [address] [place] [address] Phone: [phone] Fax: [phone]  Patient decided to: Utilize [facility] for medication synchronization, packaging and delivery  Care Plan and Follow Up Patient Decision:  Patient agrees to Care Plan and Follow-up.  Plan: Telephone follow up appointment with care management team member scheduled for:  01/04/2022 at 8:30 AM  Junius Argyle, PharmD, Para [place], [place] [phone]

## 2021-10-02 NOTE — Patient Instructions (Signed)
Visit Information It was great speaking with you today!  Please let me know if you have any questions about our visit.   Goals Addressed             This Visit's Progress    Monitor and Manage My Blood Sugar-Diabetes Type 2   On track    Timeframe:  Long-Range Goal Priority:  High Start Date: 03/27/2021                            Expected End Date: 03/27/2022                      Follow Up within 30 days   - check blood sugar daily before breakfast - check blood sugar if I feel it is too high or too low - enter blood sugar readings and medication or insulin into daily log - take the blood sugar log to all doctor visits    Why is this important?   Checking your blood sugar at home helps to keep it from getting very high or very low.  Writing the results in a diary or log helps the doctor know how to care for you.  Your blood sugar log should have the time, date and the results.  Also, write down the amount of insulin or other medicine that you take.  Other information, like what you ate, exercise done and how you were feeling, will also be helpful.     Notes:        Patient Care Plan: General Pharmacy (Adult)     Problem Identified: Hypertension, Hyperlipidemia, Diabetes, and Chronic Pain   Priority: High     Long-Range Goal: Patient-Specific Goal   Start Date: 03/27/2021  Expected End Date: 03/27/2022  This Visit's Progress: On track  Recent Progress: On track  Priority: High  Note:   Current Barriers:  Unable to achieve control of diabetes   Pharmacist Clinical Goal(s):  Patient will maintain control of diabetes as evidenced by A1c less than 7%  through collaboration with PharmD and provider.   Interventions: 1:1 collaboration with Gwyneth Sprout, FNP regarding development and update of comprehensive plan of care as evidenced by provider attestation and co-signature Inter-disciplinary care team collaboration (see longitudinal plan of care) Comprehensive  medication review performed; medication list updated in electronic medical record  Hypertension (BP goal <130/80) -Controlled: Not addressed during this visit. -Current treatment: Amlodipine 10 mg daily: Appropriate, Effective, Safe, Accessible Metoprolol 25 mg twice daily: Query appropriate -Medications previously tried: NA  -Current home readings: Does not monitor at home -Denies hypotensive/hypertensive symptoms -Questionably appropriate use of beta blocker given lack of clear indication. Will address at future visit. -Given borderline CKD, patient may benefit from ARB in place of beta blocker. Will defer for now.  -Recommended to continue current medication  Hyperlipidemia: (LDL goal < 70) -Uncontrolled, but improved.  -Current treatment: Atorvastatin 20 mg daily  -Current treatment: Aspirin 81 mg daily  -Medications previously tried: NA  -Patient close to goal, but LDL still slightly elevated. Will wait for now  -Continue current medications  Diabetes (A1c goal <7%) -Uncontrolled, but greatly approved.  -Diagnosed 2018 -Current medications: Metformin 500 mg 2 tablets twice daily: Appropriate, Effective, Safe, Accessible Trulicity 3 mg weekly on tuesdays: Appropriate, Effective, Safe, Accessible  -Medications previously tried: NA  -Current home glucose readings:  Fasting: 107, 109, 117, 118. Highest in recall 130.  -Reports  hypoglycemic  symptoms: shakiness when blood sugar was 87.    -Current meal patterns: Significantly reduced appetite since starting Trulicity. Salads 2-3x weekly. Drinks mainly water. Snacks peanuts.  -Current exercise: Unable walk due to leg pain.  -Continue current medications.   Chronic Pain (Goal: Minimize pain symptoms) -Not ideally controlled: Not addressed during this visit. -Managed by Dr. Holley Raring -Current treatment  Gabapentin 300 mg three times daily  -Medications previously tried: NA  -Recommended scheduling follow-up with pain management.   -Recommended to continue current medication  Patient Goals/Self-Care Activities Patient will:  - check glucose daily before breakfast, document, and provide at future appointments  Follow Up Plan: Telephone follow up appointment with care management team member scheduled for:  01/04/2022 at 8:30 AM    Patient agreed to services and verbal consent obtained.   Patient verbalizes understanding of instructions and care plan provided today and agrees to view in [place]. Active MyChart status and patient understanding of how to access instructions and care plan via MyChart confirmed with patient.     Junius Argyle, PharmD, Para [place], CPP  Clinical Pharmacist Practitioner  [facility] [phone]

## 2021-10-09 ENCOUNTER — Telehealth: Payer: Self-pay | Admitting: Family Medicine

## 2021-10-09 DIAGNOSIS — E1165 Type 2 diabetes mellitus with hyperglycemia: Secondary | ICD-10-CM

## 2021-10-09 NOTE — Telephone Encounter (Deleted)
[facility] called and spoke to [place] about the refill(s) Accu-chek requested. Advised it was sent on 08/27/21 #100/12 refill(s). She says she had a delivery on 09/07/21, so if she's checking once daily, she it should last 100 days. Patient called and she says she has a different meter and checks her blood sugar 3 times a day. She has the One Touch Ultra II meter and will need test strips and lancets for that meter sent to [facility]. Advised I will send this request to [facility].

## 2021-10-09 NOTE — Telephone Encounter (Deleted)
Copied from [place] [id number]. Topic: General - Other >> Oct 09, 2021  2:21 PM Tessa Lerner A wrote: Reason for CRM: Medication Refill - Medication: glucose blood (ACCU-CHEK GUIDE) test strip [[id number]]  Has the patient contacted their pharmacy? Yes.  The patient was directed to contact their PCP (Agent: If no, request that the patient contact the pharmacy for the refill. If patient does not wish to contact the pharmacy document the reason why and proceed with request.) (Agent: If yes, when and what did the pharmacy advise?)  Preferred Pharmacy (with phone number or street name): [facility] - [place], [place] - [address] [place] [address] Phone: [phone] Fax: [phone] Hours: Not open 24 hours   Has the patient been seen for an appointment in the last year OR does the patient have an upcoming appointment? Yes.    Agent: Please be advised that RX refills may take up to 3 business days. We ask that you follow-up with your pharmacy.

## 2021-10-09 NOTE — Telephone Encounter (Signed)
Copied from [place] [id number]. Topic: General - Other >> Oct 09, 2021  2:21 PM Tessa Lerner A wrote: Reason for CRM: Medication Refill - Medication: glucose blood (ACCU-CHEK GUIDE) test strip [[id number]]   Has the patient contacted their pharmacy? Yes.  The patient was directed to contact their PCP (Agent: If no, request that the patient contact the pharmacy for the refill. If patient does not wish to contact the pharmacy document the reason why and proceed with request.) (Agent: If yes, when and what did the pharmacy advise?)   Preferred Pharmacy (with phone number or street name): [facility] - [place], [place] - [address] [place] [address] Phone: [phone] Fax: [phone] Hours: Not open 24 hours     Has the patient been seen for an appointment in the last year OR does the patient have an upcoming appointment? Yes.     Agent: Please be advised that RX refills may take up to 3 business days. We ask that you follow-up with your pharmacy.

## 2021-10-09 NOTE — Telephone Encounter (Signed)
[facility] called and spoke to [place] about the refill(s) Accu-chek requested. Advised it was sent on 08/27/21 #100/12 refill(s). She says she had a delivery on 09/07/21, so if she's checking once daily, she it should last 100 days. Patient called and she says she has a different meter and checks her blood sugar 3 times a day. She has the One Touch Ultra II meter and will need test strips and lancets for that meter sent to [facility]. Advised I will send this request to [facility].

## 2021-10-11 MED ORDER — ONETOUCH ULTRASOFT LANCETS MISC: 12 refills | Status: DC

## 2021-10-11 MED ORDER — ONETOUCH ULTRA VI STRP: ORAL_STRIP | 12 refills | Status: DC

## 2021-10-11 NOTE — Addendum Note (Signed)
Addended by: Daron Offer A on: 10/11/2021 04:40 PM   Modules accepted: Orders

## 2021-10-11 NOTE — Telephone Encounter (Signed)
I will take care of this, I am unclear which testing supplies she wants so I will have my team check with her to confirm.   Junius Argyle, PharmD, Para [place], CPP  Clinical Pharmacist Practitioner  [facility] [phone]

## 2021-10-12 NOTE — Progress Notes (Signed)
Established patient visit   Patient: Emily Stokes   DOB: 05-26-1960   61 y.o. Female  MRN: [id number] Visit Date: 10/16/2021  Today's healthcare provider: Gwyneth Sprout, FNP  Re Introduced to nurse practitioner role and practice setting.  All questions answered.  Discussed provider/patient relationship and expectations.   I,Tiffany J Bragg,acting as a scribe for Gwyneth Sprout, FNP.,have documented all relevant documentation on the behalf of Gwyneth Sprout, FNP,as directed by  Gwyneth Sprout, FNP while in the presence of Gwyneth Sprout, FNP.   Chief Complaint  Patient presents with   Diabetes   Urinary Frequency    Patient complains of frequency and incontinence off and on for several months increasing in the past two weeks.    Subjective    HPI HPI     Urinary Frequency    Additional comments: Patient complains of frequency and incontinence off and on for several months increasing in the past two weeks.       Last edited by Smitty Knudsen, CMA on 10/16/2021  9:18 AM.      Diabetes Mellitus Type II, Follow-up  Lab Results  Component Value Date   HGBA1C 6.5 (A) 10/16/2021   HGBA1C 7.5 (H) 07/05/2021   HGBA1C 10.9 (A) 01/29/2021   Wt Readings from Last 3 Encounters:  10/16/21 214 lb 3.2 oz (97.2 kg)  07/17/21 220 lb 3.2 oz (99.9 kg)  07/05/21 224 lb (101.6 kg)   Last seen for diabetes 3 months ago.  Management since then includes continue medication. She reports excellent compliance with treatment. She is not having side effects.  Symptoms: No fatigue No foot ulcerations  Yes appetite changes No nausea  Yes paresthesia of the feet  No polydipsia  Yes polyuria Yes visual disturbances   No vomiting     Home blood sugar records: fasting range: between 97-117  Episodes of hypoglycemia? No    Most Recent Eye Exam: March 2022 Current exercise: none Current diet habits: well balanced  Pertinent Labs: Lab Results  Component Value Date   CHOL 128  07/05/2021   HDL 36 (L) 07/05/2021   LDLCALC 79 07/05/2021   TRIG 63 07/05/2021   CHOLHDL 5.3 (H) 04/20/2019   Lab Results  Component Value Date   NA 146 (H) 07/05/2021   K 4.2 07/05/2021   CREATININE 1.19 (H) 07/05/2021   EGFR 52 (L) 07/05/2021   MICROALBUR 50 06/06/2020   LABMICR 22.2 07/05/2021     ---------------------------------------------------------------------------------------------------   Medications: Outpatient Medications Prior to Visit  Medication Sig   acetaminophen (TYLENOL) 500 MG tablet Take 500 mg by mouth every 6 (six) hours as needed.   amLODipine (NORVASC) 10 MG tablet Take 1 tablet (10 mg total) by mouth daily.   aspirin EC (ASPIRIN LOW DOSE) 81 MG tablet TAKE ONE TABLET BY MOUTH ONCE DAILY SWALLOW whole   atorvastatin (LIPITOR) 20 MG tablet Take 1 tablet (20 mg total) by mouth daily.   Dulaglutide (TRULICITY) 3 IO/9.7DZ SOPN Inject 3 mg as directed once a week.   gabapentin (NEURONTIN) 300 MG capsule Take 1 capsule (300 mg total) by mouth 3 (three) times daily.   glucose blood (ONETOUCH ULTRA) test strip Use to monitor blood sugars three times daily as directed   Ibuprofen-diphenhydrAMINE Cit (IBUPROFEN PM PO) Take by mouth at bedtime as needed.   Lancet Devices (EASY MINI EJECT LANCING DEVICE) MISC    Lancets (ONETOUCH ULTRASOFT) lancets Use to monitor blood sugars three  times daily as directed   metFORMIN (GLUCOPHAGE) 500 MG tablet Take 2 tablets (1,000 mg total) by mouth 2 (two) times daily with a meal.   metoprolol tartrate (LOPRESSOR) 25 MG tablet Take 1 tablet (25 mg total) by mouth 2 (two) times daily.   No facility-administered medications prior to visit.    Review of Systems     Objective    BP 103/67 (BP Location: Right Arm, Patient Position: Sitting, Cuff Size: Large)   Pulse 83   Temp 98.2 F (36.8 C) (Oral)   Resp 15   Ht '5\' 9"'  (1.753 m)   Wt 214 lb 3.2 oz (97.2 kg)   SpO2 98%   BMI 31.63 kg/m    Physical Exam Vitals and  nursing note reviewed.  Constitutional:      General: She is not in acute distress.    Appearance: Normal appearance. She is obese. She is not ill-appearing, toxic-appearing or diaphoretic.  HENT:     Head: Normocephalic and atraumatic.  Cardiovascular:     Rate and Rhythm: Normal rate and regular rhythm.     Pulses: Normal pulses.     Heart sounds: Normal heart sounds. No murmur heard.   No friction rub. No gallop.  Pulmonary:     Effort: Pulmonary effort is normal. No respiratory distress.     Breath sounds: Normal breath sounds. No stridor. No wheezing, rhonchi or rales.  Chest:     Chest wall: No tenderness.  Abdominal:     General: Bowel sounds are normal.     Palpations: Abdomen is soft.  Musculoskeletal:        General: No swelling, tenderness, deformity or signs of injury. Normal range of motion.     Right lower leg: No edema.     Left lower leg: No edema.  Skin:    General: Skin is warm and dry.     Capillary Refill: Capillary refill takes less than 2 seconds.     Coloration: Skin is not jaundiced or pale.     Findings: No bruising, erythema, lesion or rash.  Neurological:     General: No focal deficit present.     Mental Status: She is alert and oriented to person, place, and time. Mental status is at baseline.     Cranial Nerves: No cranial nerve deficit.     Sensory: No sensory deficit.     Motor: No weakness.     Coordination: Coordination normal.  Psychiatric:        Mood and Affect: Mood normal.        Behavior: Behavior normal.        Thought Content: Thought content normal.        Judgment: Judgment normal.     Results for orders placed or performed in visit on 10/16/21  POCT glycosylated hemoglobin (Hb A1C)  Result Value Ref Range   Hemoglobin A1C 6.5 (A) 4.0 - 5.6 %   Est. average glucose Bld gHb Est-mCnc 140     Assessment & Plan     Problem List Items Addressed This Visit       Cardiovascular and Mediastinum   Hypertension associated with  diabetes ([place])    Chronic, stable Denies CP Denies SOB/ DOE Denies low blood pressure/hypotension Denies vision changes No LE Edema noted on exam Continue medication, Norvasc 10 mg, Metop 25 mg BID Seek emergent care if you develop chest pain or chest pressure       Relevant Orders   Ambulatory referral  to Vascular Surgery     Endocrine   Hyperlipidemia associated with type 2 diabetes mellitus (HCC)    Chronic, stable recommend diet low in saturated fat and regular exercise - 30 min at least 5 times per week -continue lipitor 20 mg Goal <70 given LDL       Relevant Orders   Ambulatory referral to Vascular Surgery   Type 2 diabetes mellitus with hyperglycemia, without long-term current use of insulin (HCC) - Primary    Chronic, stable Continue trulicity at 3 mg Continue 1000 mg metformin mg BID On lipitor, 20 mg No on ace/arb        Relevant Orders   POCT glycosylated hemoglobin (Hb A1C) (Completed)   Ambulatory referral to Vascular Surgery     Nervous and Auditory   Lumbar radiculopathy    Chronic, stable Will refer to PT to assist  Will refer to vascular to check for stenosis        Relevant Orders   Ambulatory referral to Vascular Surgery   Ambulatory referral to Physical Therapy     Genitourinary   OAB (overactive bladder)    Acute, stable Recommend trial of ditropan at 10 mg qHS Will refer to urology if symptoms do not improve       Relevant Medications   oxybutynin (DITROPAN XL) 10 MG 24 hr tablet     Other   Pain of left lower extremity    Chronic, stable Will refer to PT to assist  Will refer to vascular to check for stenosis         Relevant Orders   Ambulatory referral to Vascular Surgery   Ambulatory referral to Physical Therapy     Return in about 3 months (around 01/16/2022).      [name], FNP, have reviewed all documentation for this visit. The documentation on 10/16/21 for the exam, diagnosis, procedures, and  orders are all accurate and complete.    Gwyneth Sprout, [place] [phone] (phone) [phone] (fax)  [place]

## 2021-10-16 ENCOUNTER — Ambulatory Visit (INDEPENDENT_AMBULATORY_CARE_PROVIDER_SITE_OTHER): Payer: Medicare HMO | Admitting: Family Medicine

## 2021-10-16 ENCOUNTER — Encounter: Payer: Self-pay | Admitting: Family Medicine

## 2021-10-16 VITALS — BP 103/67 | HR 83 | Temp 98.2°F | Resp 15 | Ht 69.0 in | Wt 214.2 lb

## 2021-10-16 DIAGNOSIS — M79605 Pain in left leg: Secondary | ICD-10-CM | POA: Diagnosis not present

## 2021-10-16 DIAGNOSIS — E1165 Type 2 diabetes mellitus with hyperglycemia: Secondary | ICD-10-CM | POA: Diagnosis not present

## 2021-10-16 DIAGNOSIS — M5416 Radiculopathy, lumbar region: Secondary | ICD-10-CM | POA: Diagnosis not present

## 2021-10-16 DIAGNOSIS — I152 Hypertension secondary to endocrine disorders: Secondary | ICD-10-CM

## 2021-10-16 DIAGNOSIS — N3281 Overactive bladder: Secondary | ICD-10-CM

## 2021-10-16 DIAGNOSIS — E1169 Type 2 diabetes mellitus with other specified complication: Secondary | ICD-10-CM

## 2021-10-16 DIAGNOSIS — E785 Hyperlipidemia, unspecified: Secondary | ICD-10-CM

## 2021-10-16 DIAGNOSIS — E1159 Type 2 diabetes mellitus with other circulatory complications: Secondary | ICD-10-CM | POA: Diagnosis not present

## 2021-10-16 LAB — POCT GLYCOSYLATED HEMOGLOBIN (HGB A1C)
Est. average glucose Bld gHb Est-mCnc: 140
Hemoglobin A1C: 6.5 % — AB (ref 4.0–5.6)

## 2021-10-16 MED ORDER — OXYBUTYNIN CHLORIDE ER 10 MG PO TB24: 10.0000 mg | ORAL_TABLET | Freq: Every day | ORAL | 11 refills | Status: DC

## 2021-10-16 NOTE — Assessment & Plan Note (Signed)
Chronic, stable Will refer to PT to assist  Will refer to vascular to check for stenosis

## 2021-10-16 NOTE — Assessment & Plan Note (Signed)
Acute, stable Recommend trial of ditropan at 10 mg qHS Will refer to urology if symptoms do not improve

## 2021-10-16 NOTE — Assessment & Plan Note (Signed)
Chronic, stable recommend diet low in saturated fat and regular exercise - 30 min at least 5 times per week -continue lipitor 20 mg Goal <70 given LDL

## 2021-10-16 NOTE — Patient Instructions (Signed)
The CDC recommends two doses of Shingrix (the new shingles vaccine) separated by 2 to 6 months for adults age 62 years and older. I recommend checking with your insurance plan regarding coverage for this vaccine.    Please call and schedule your mammogram:  [facility]  [address], [address] [facility] [place],  [place]  [address] Get Driving Directions Main: [phone]  Sunday:Closed Monday:7:20 AM - 5:00 PM Tuesday:7:20 AM - 5:00 PM Wednesday:7:20 AM - 5:00 PM Thursday:7:20 AM - 5:00 PM Friday:7:20 AM - 4:30 PM Saturday:Closed  

## 2021-10-16 NOTE — Assessment & Plan Note (Signed)
Chronic, stable Continue trulicity at 3 mg Continue 1000 mg metformin mg BID On lipitor, 20 mg No on ace/arb

## 2021-10-16 NOTE — Assessment & Plan Note (Signed)
Chronic, stable Denies CP Denies SOB/ DOE Denies low blood pressure/hypotension Denies vision changes No LE Edema noted on exam Continue medication, Norvasc 10 mg, Metop 25 mg BID Seek emergent care if you develop chest pain or chest pressure

## 2021-10-22 ENCOUNTER — Other Ambulatory Visit: Payer: Self-pay | Admitting: Family Medicine

## 2021-10-22 ENCOUNTER — Other Ambulatory Visit: Payer: Self-pay | Admitting: Student in an Organized Health Care Education/Training Program

## 2021-10-22 DIAGNOSIS — G894 Chronic pain syndrome: Secondary | ICD-10-CM

## 2021-10-22 DIAGNOSIS — M47816 Spondylosis without myelopathy or radiculopathy, lumbar region: Secondary | ICD-10-CM

## 2021-10-22 DIAGNOSIS — M5416 Radiculopathy, lumbar region: Secondary | ICD-10-CM

## 2021-10-26 ENCOUNTER — Other Ambulatory Visit: Payer: Self-pay | Admitting: Family Medicine

## 2021-10-26 NOTE — Telephone Encounter (Signed)
Requested medication (s) are due for refill today:   Not sure.    Requested medication (s) are on the active medication list:   No  Future visit scheduled:   Yes   Last ordered: Not sure.  Returned because not sure if this was ordered and received or not.     Requested Prescriptions  Pending Prescriptions Disp Refills   Blood Glucose Monitoring Suppl ([place]) w/Device KIT [Pharmacy Med Name: OneTouch Verio Flex meter]  1    Sig: Use as directed 3 times daily to test blood sugar     Endocrinology: Diabetes - Testing Supplies Passed - 10/26/2021  8:00 AM      Passed - Valid encounter within last 12 months    Recent Outpatient Visits           1 week ago Type 2 diabetes mellitus with hyperglycemia, without long-term current use of insulin [facility])   [facility] [name] T, FNP   3 months ago Cutaneous abscess of left upper extremity   [name], Erin E, PA-C   3 months ago Hypertension associated with diabetes [facility])   [facility] [name] T, FNP   6 months ago Acute cystitis without hematuria   [name], [name], PA-C   9 months ago Urinary frequency   [facility] Birdie Sons, MD       Future Appointments             In 2 months Gwyneth Sprout, [place], [place]

## 2021-10-31 ENCOUNTER — Other Ambulatory Visit: Payer: Self-pay

## 2021-10-31 ENCOUNTER — Telehealth: Payer: Self-pay

## 2021-10-31 DIAGNOSIS — E1165 Type 2 diabetes mellitus with hyperglycemia: Secondary | ICD-10-CM

## 2021-10-31 NOTE — Telephone Encounter (Signed)
Spoke with pharmacy, they said the script was sent to another pharmacy. Left message notifying patient to see if they can pick it up at that location.

## 2021-10-31 NOTE — Telephone Encounter (Signed)
Copied from [place] [id number]. Topic: General - Inquiry >> Oct 31, 2021  1:22 PM Erskine Squibb wrote: Reason for CRM: Patient called in stating she received a blood glucose monitor but never received the test strips. She is requesting a prescription for test strips be called into:  [facility] - [place], [place] - [address]  Phone:  [phone] Fax:  [phone]   Please assist patient further

## 2021-10-31 NOTE — Telephone Encounter (Signed)
Spoke with patient, she is checking with her pharmacy and the person that delivered her medication to see if they have it before it is reordered.

## 2021-11-05 ENCOUNTER — Other Ambulatory Visit: Payer: Self-pay | Admitting: *Deleted

## 2021-11-06 ENCOUNTER — Other Ambulatory Visit: Payer: Self-pay | Admitting: *Deleted

## 2021-11-06 DIAGNOSIS — M5416 Radiculopathy, lumbar region: Secondary | ICD-10-CM

## 2021-11-06 DIAGNOSIS — G894 Chronic pain syndrome: Secondary | ICD-10-CM

## 2021-11-06 DIAGNOSIS — M47816 Spondylosis without myelopathy or radiculopathy, lumbar region: Secondary | ICD-10-CM

## 2021-11-06 MED ORDER — GABAPENTIN 300 MG PO CAPS: 300.0000 mg | ORAL_CAPSULE | Freq: Three times a day (TID) | ORAL | 2 refills | Status: DC

## 2021-11-21 ENCOUNTER — Ambulatory Visit: Payer: Medicare HMO | Attending: Family Medicine | Admitting: Physical Therapy

## 2021-11-21 ENCOUNTER — Telehealth: Payer: Self-pay | Admitting: Physical Therapy

## 2021-11-21 NOTE — Telephone Encounter (Signed)
Called pt to inquire about her absence. Did not reach and could not leave VM because VM was full.

## 2021-11-22 ENCOUNTER — Ambulatory Visit: Payer: Self-pay

## 2021-11-22 DIAGNOSIS — E1165 Type 2 diabetes mellitus with hyperglycemia: Secondary | ICD-10-CM

## 2021-11-22 NOTE — Chronic Care Management (AMB) (Signed)
  Care Management   RN Visit Note  11/22/2021 Name: CHARLYE SPARE MRN: [id number] DOB: 1960/08/07  Subjective: GLORIANNE PROCTOR is a 61 y.o. year old female who is a primary care patient of Gwyneth Sprout, FNP. The care management team was consulted for assistance with disease management and care coordination needs.    Engaged with patient by telephone for follow up visit in response to provider referral for case management and care coordination services.   Brief outreach with Mrs. Mata. Requested to reschedule due to being busy at the time of the call. Denies urgent concerns. Agreed to telephonic outreach within the next two weeks.   PLAN A member of the care management team will follow up within the next two weeks.   [name] [phone]

## 2021-11-26 ENCOUNTER — Telehealth: Payer: Self-pay

## 2021-11-26 NOTE — Telephone Encounter (Signed)
Spoke with patient and advised that she would need an appt. Daneil Dan has no open appt's until next week. Advised patient she could also go to urgent care, she stated she would do that.

## 2021-11-26 NOTE — Telephone Encounter (Signed)
Patient called and stated that she could have a possible UTI. It started about a week ago. Pt would like for [name], FNP to prescribe medication. Please advise pt.

## 2021-11-26 NOTE — Progress Notes (Signed)
Chronic Care Management Pharmacy Assistant   Name: Emily Stokes  MRN: [id number] DOB: December 23, 1960  Reason for Encounter: Medication Review/Medication Coordination for [facility]   Recent office visits:  10/16/2021 [name], FNP (PCP Office Visit) for Type II DM- Started: Oxybutynin Chloride 10 mg daily, Lab orders placed, Referral to PT placed, Referral to Vascular Surgery placed, patient to follow-up in 3 months  Recent consult visits:  None ID  Hospital visits:  None in previous 6 months  Medications: Outpatient Encounter Medications as of 11/26/2021  Medication Sig   acetaminophen (TYLENOL) 500 MG tablet Take 500 mg by mouth every 6 (six) hours as needed.   amLODipine (NORVASC) 10 MG tablet Take 1 tablet (10 mg total) by mouth daily.   aspirin EC (ASPIRIN LOW DOSE) 81 MG tablet TAKE ONE TABLET BY MOUTH ONCE DAILY SWALLOW whole   atorvastatin (LIPITOR) 20 MG tablet Take 1 tablet (20 mg total) by mouth daily.   Dulaglutide (TRULICITY) 3 AO/1.3YQ SOPN Inject 3 mg as directed once a week.   gabapentin (NEURONTIN) 300 MG capsule Take 1 capsule (300 mg total) by mouth 3 (three) times daily.   glucose blood (ONETOUCH ULTRA) test strip Use to monitor blood sugars three times daily as directed   Ibuprofen-diphenhydrAMINE Cit (IBUPROFEN PM PO) Take by mouth at bedtime as needed.   Lancet Devices (EASY MINI EJECT LANCING DEVICE) MISC    Lancets (ONETOUCH ULTRASOFT) lancets Use to monitor blood sugars three times daily as directed   metFORMIN (GLUCOPHAGE) 500 MG tablet Take 2 tablets (1,000 mg total) by mouth 2 (two) times daily with a meal.   metoprolol tartrate (LOPRESSOR) 25 MG tablet Take 1 tablet (25 mg total) by mouth 2 (two) times daily.   oxybutynin (DITROPAN XL) 10 MG 24 hr tablet Take 1 tablet (10 mg total) by mouth at bedtime.   No facility-administered encounter medications on file as of 11/26/2021.   Care Gaps: Tetanus/TDAP Zoster Vaccine COVID-19 Vaccine  Booster 3 Diabetic Eye Exam Mammogram  Star Rating Drugs: Atorvastatin 20 mg last filled on 11/02/2021 for a 30-Day supply with [facility] Trulicity 3 mg last filled on 11/02/2021 for a 30-Day supply with [facility] Metformin 500 mg last filled on 11/02/2021 for a 30-Day supply with [facility]  BP Readings from Last 3 Encounters:  10/16/21 103/67  07/17/21 110/76  07/05/21 113/77    Lab Results  Component Value Date   HGBA1C 6.5 (A) 10/16/2021    Patient obtains medications through Adherence Packaging  30 Days   Last adherence delivery included:  Amlodipine 10 mg 1 tablet daily (Breakfast) Atorvastatin 20 mg 1 tablet daily (Breakfast) Metformin 500 mg 2 tablets twice daily (Breakfast, Bedtime) Aspirin 81 mg 1 tablet daily (Breakfast) Metoprolol Tartrate 25 mg 1 tablet twice daily (Breakfast, Bedtime) Gabapentin 300 mg 1 capsule three times daily (Breakfast, Lunch, Evening Meals) Trulicity Inject 3 mg into the skin once a week  Patient declined medications last month: No medications were declined  Patient is due for next adherence delivery on: 07/27/20223 (Thursday) 2nd Route. Called patient and reviewed medications and coordinated delivery.  This delivery to include: Gabapentin 300 mg 1 capsule three times daily (Breakfast, Lunch, Evening Meals) Metformin 500 mg 2 tablets twice daily (Breakfast, Bedtime) Amlodipine 10 mg 1 tablet daily (Breakfast) Atorvastatin 20 mg 1 tablet daily (Breakfast) Aspirin 81 mg 1 tablet daily (Breakfast) Metoprolol Tartrate 25 mg 1 tablet twice daily (Breakfast, Bedtime) Trulicity Inject 3 mg into the skin  once a week Oxybutynin 10 mg 1 tablet daily (Bedtime) OneTouch Test Strips OneTouch Lancets  Patient declined the following medications for the month of July: No medications were declined  Coordinated acute fill for OneTouch Lancets and Test strips   Patient needs refills for: Metoprolol 25 mg and Aspirin 81 mg  both medications are prescribed by the PCP in which Junius Argyle, CPP can send the refills for  Confirmed delivery date of 12/06/2021 2nd Route, advised patient that pharmacy will contact them the morning of delivery.  I spoke to the patient and she reports that she is not feeling too well. Per patient her symptoms are frequent urination, excessive thirst and dry mouth. I inquired about the patient's blood sugar readings as these are symptoms of elevated blood sugars, but patient advised she has not been able to check her blood sugars as she is out of Onetouch test strips. Patient is also concerned she may have a UTI.   I sent a message to [facility] to see if we can get her teststips and lancets delivered to her this week so she can start testing her blood sugars regularly. I also transferred the patient to the PCP's Office to have her speak with someone about her symptoms and she is waiting on a nurse to call her back.   Patient has a telephone follow-up appointment with Junius Argyle, CPP on 01/04/2022 @ 0830.  [name], CPA/CMA Clinical Pharmacist Assistant Phone: [phone]

## 2021-12-04 ENCOUNTER — Telehealth: Payer: Medicare HMO

## 2021-12-04 DIAGNOSIS — R300 Dysuria: Secondary | ICD-10-CM | POA: Diagnosis not present

## 2021-12-07 ENCOUNTER — Ambulatory Visit: Payer: Self-pay

## 2021-12-07 NOTE — Telephone Encounter (Signed)
   Chief Complaint: Question about UC visit Symptoms:  Frequency:  Pertinent Negatives: Patient denies  Disposition: '[]'$ ED /'[]'$ Urgent Care (no appt availability in office) / '[]'$ Appointment(In office/virtual)/ '[]'$  [facility]/ '[]'$ Home Care/ '[]'$ Refused Recommended Disposition /'[]'$ [place] [facility]/ '[x]'$  Follow-up with PCP Additional Notes: Pt was seen at [facility] for UTI. Pt had questions about results of UA and medicine prescribed. PT will follow up with UC.  Summary: possible uti   Pt called saying a week ago saying she went to an urgent care for what she thought was a UTI.  She was called today and told her results and given a rx for a powder..  She is asking a nurse call her [phone]      Reason for Disposition  [1] Caller requesting NON-URGENT health information AND [2] PCP's office is the best resource  Answer Assessment - Initial Assessment Questions 1. REASON FOR CALL or QUESTION: "What is your reason for calling today?" or "How can I best help you?" or "What question do you have that I can help answer?"     Pt was calling to find out about results and treatment from when she went to [facility] for UTI.  Protocols used: Information Only Call - No Triage-A-AH

## 2021-12-07 NOTE — Telephone Encounter (Signed)
Pt called saying a week ago saying she went to an urgent care for what she thought was a UTI.  She was called today and told her results and given a rx for a powder..  She is asking a nurse call her back.[phone]   Left a message to call back.

## 2021-12-10 ENCOUNTER — Telehealth: Payer: Medicare HMO

## 2021-12-10 ENCOUNTER — Telehealth: Payer: Self-pay

## 2021-12-10 NOTE — Telephone Encounter (Signed)
  Care Management   Outreach Note  12/10/2021 Name: Emily Stokes MRN: [id number] DOB: 1960-09-20  An unsuccessful telephone outreach was attempted today. The patient was referred to the case management team for assistance with care management and care coordination.   Follow Up Plan:  A HIPAA compliant voice message was left today requesting a return call.  [place] Management [phone]

## 2021-12-12 ENCOUNTER — Ambulatory Visit: Payer: Self-pay

## 2021-12-12 NOTE — Patient Instructions (Signed)
Visit Information  Thank you for taking time to visit with me today. Please don't hesitate to contact me if I can be of assistance to you.   Following are the goals we discussed today:   Goals Addressed   None     Our next appointment is {NEXTVISITTYPE:26617} on *** at ***  Please call the care guide team at [phone] if you need to cancel or reschedule your appointment.   If you are experiencing a Mental Health or Cash or need someone to talk to, please {MHBHCRISISCONTACTS:26616}   {CM PT PRINT INSTRUCTIONS:22237}  {CM FOLLOW UP PLAN:22241}  SIGNATURE***

## 2021-12-13 NOTE — Patient Outreach (Addendum)
  Care Coordination   Visit Note    Name: Emily Stokes MRN: [id number] DOB: Aug 31, 1960  Emily Stokes is a 61 y.o. year old female who sees Gwyneth Sprout, FNP for primary care. I spoke with  Loni Beckwith by phone today  What matters to the patients health and wellness today?  Schedule clinic follow-up.   Goals Addressed             This Visit's Progress    Arrange clinic follow up       Care Coordination Interventions: Patient reports recently being evaluated in Urgent Care for possible UTI. Fosfomycin was prescribed. Reports contacting the clinic for PCP follow-up however appointments were not available. Requesting assistance with arranging a provider visit. Will contact the clinic schedulers and follow up this week.          SDOH assessments and interventions completed:  No    Care Coordination Interventions Activated:  Yes  Care Coordination Interventions:  Yes, provided   Follow up plan:  Will contact clinic regarding provider follow-up. Will follow up with Emily Stokes later this week.    Encounter Outcome:  Pt. Visit Completed   [place] Management [phone]

## 2021-12-14 ENCOUNTER — Ambulatory Visit: Payer: Self-pay

## 2021-12-14 ENCOUNTER — Ambulatory Visit: Payer: Medicare Other | Admitting: Family Medicine

## 2021-12-14 NOTE — Patient Outreach (Signed)
  Care Coordination   Follow Up Visit Note   12/14/2021 Name: Emily Stokes MRN: [id number] DOB: 1960/12/18  Emily Stokes is a 61 y.o. year old female who sees Gwyneth Sprout, FNP for primary care. I spoke with  Loni Beckwith by phone today  What matters to the patients health and wellness today?  Assistance with scheduling a clinic appointment  Goals Addressed             This Visit's Progress    Arrange clinic follow up       Care Coordination Interventions: Patient reports recently being evaluated in Urgent Care for possible UTI. Fosfomycin was prescribed. Reports contacting the clinic for PCP follow-up however appointments were not available. Requesting assistance with arranging a provider visit. Will contact the clinic schedulers and follow up this week. Update 12/14/21: Patient was scheduled for an acute clinic visit today. Reports being unable to attend d/t assisting with family members in the home. Appointment rescheduled for 12/19/21.  Reviewed indications for seeking care if symptoms return over the weekend.          SDOH assessments and interventions completed:  Yes  SDOH Interventions Today    Flowsheet Row Most Recent Value  SDOH Interventions   Food Insecurity Interventions Intervention Not Indicated  Transportation Interventions Intervention Not Indicated        Care Coordination Interventions Activated:  Yes  Care Coordination Interventions:  Yes, provided   Follow up plan: Follow up call scheduled for January 15, 2022.    Encounter Outcome:  Pt. Visit Completed   [place] Management [phone]

## 2021-12-14 NOTE — Patient Instructions (Signed)
Visit Information  Thank you for taking time to visit with me today. Please don't hesitate to contact me if I can be of assistance to you.   Following are the goals we discussed today:   Goals Addressed             This Visit's Progress    Arrange clinic follow up       Care Coordination Interventions: Patient reports recently being evaluated in Urgent Care for possible UTI. Fosfomycin was prescribed. Reports contacting the clinic for PCP follow-up however appointments were not available. Requesting assistance with arranging a provider visit. Will contact the clinic schedulers and follow up this week. Update 12/14/21: Patient was scheduled for an acute clinic visit today. Reports being unable to attend d/t assisting with family members in the home. Appointment rescheduled for 12/19/21.  Reviewed indications for seeking care if symptoms return over the weekend.           Our next appointment is January 15, 2022. Please call the care guide team at [phone] if you need to cancel or reschedule your appointment.    The patient verbalized understanding of the information discussed during the telephonic outreach today. DECLINED offer for mailed instructions or resources.  Patient will complete outreach with her PCP on 12/19/21. A member of the care management team will follow up next month.   [facility] [phone]

## 2021-12-17 ENCOUNTER — Encounter (INDEPENDENT_AMBULATORY_CARE_PROVIDER_SITE_OTHER): Payer: Self-pay | Admitting: Nurse Practitioner

## 2021-12-18 ENCOUNTER — Ambulatory Visit (INDEPENDENT_AMBULATORY_CARE_PROVIDER_SITE_OTHER): Payer: Medicare Other | Admitting: Nurse Practitioner

## 2021-12-18 ENCOUNTER — Encounter (INDEPENDENT_AMBULATORY_CARE_PROVIDER_SITE_OTHER): Payer: Self-pay | Admitting: Nurse Practitioner

## 2021-12-18 VITALS — BP 119/75 | HR 89 | Resp 16 | Wt 207.0 lb

## 2021-12-18 DIAGNOSIS — M48062 Spinal stenosis, lumbar region with neurogenic claudication: Secondary | ICD-10-CM

## 2021-12-18 DIAGNOSIS — M79605 Pain in left leg: Secondary | ICD-10-CM | POA: Diagnosis not present

## 2021-12-18 DIAGNOSIS — E785 Hyperlipidemia, unspecified: Secondary | ICD-10-CM | POA: Diagnosis not present

## 2021-12-18 DIAGNOSIS — E1169 Type 2 diabetes mellitus with other specified complication: Secondary | ICD-10-CM

## 2021-12-18 NOTE — Progress Notes (Signed)
Subjective:    Patient ID: Emily Stokes, female    DOB: [birth year], 61 y.o.   MRN: [id number] Chief Complaint  Patient presents with   New Patient (Initial Visit)    Ref Rollene Rotunda consult left leg pain    Emily Stokes is a 61 year old female that presents today as a referral from her primary care provider Ms. Rollene Rotunda in regards to left lower extremity pain.  The patient notices pain has been going on for years.  The pain is mainly located in her thigh area and it radiates up towards her hip buttocks and back area.  She has been seeing pain management for years as well to treat this pain.  She notes that the thigh itself is numb and she has significant pain with ambulation.  She currently notes that it is worse when she lays down and she is unable to get comfortable.  She does note however that the pain does not radiate below her knee.  She denies any pain or numbness in her feet or toes.    Review of Systems  Cardiovascular:  Negative for leg swelling.  Musculoskeletal:  Positive for back pain, gait problem and myalgias.  All other systems reviewed and are negative.      Objective:   Physical Exam Vitals reviewed.  HENT:     Head: Normocephalic.  Cardiovascular:     Rate and Rhythm: Normal rate.     Pulses:          Dorsalis pedis pulses are 0 on the right side and 0 on the left side.       Posterior tibial pulses are 1+ on the right side and 0 on the left side.  Pulmonary:     Effort: Pulmonary effort is normal.  Skin:    General: Skin is warm and dry.  Neurological:     Mental Status: She is alert and oriented to person, place, and time.     Motor: Weakness present.     Gait: Gait abnormal.  Psychiatric:        Mood and Affect: Mood normal.        Behavior: Behavior normal.        Thought Content: Thought content normal.        Judgment: Judgment normal.     BP 119/75 (BP Location: Right Arm)   Pulse 89   Resp 16   Wt 207 lb (93.9 kg)   BMI 30.57 kg/m   Past  Medical History:  Diagnosis Date   Abdominal pain, epigastric    Acute cystitis without hematuria 04/19/2021   Anemia    Backache    Bronchitis    Calculus, kidney    Carpal tunnel syndrome    Cellulitis of face 05/30/2016   Chest pain    Chicken pox    Circulatory disease    Diabetes mellitus without complication (HCC)    Disorder of kidney and ureter    Excess, menstruation    Hypercholesteremia    Hypertension, essential, benign    Infected postoperative seroma    Infected surgical wound 09/15/2008   Iron deficiency    Leg pain 02/02/2015   Malaise and fatigue    Measles    MRSA (methicillin resistant staph aureus) culture positive    Nausea alone    Nonspecific abnormal electrocardiogram (ECG) (EKG)    Pain in elbow 02/05/2016   Pain in joint, lower leg    Pain in shoulder 12/05/2011    Social History  Socioeconomic History   Marital status: Widowed    Spouse name: Not on file   Number of children: 4   Years of education: Not on file   Highest education level: Bachelor's degree (e.g., BA, AB, BS)  Occupational History   Occupation: Control and instrumentation engineer    Comment: disability  Tobacco Use   Smoking status: Never   Smokeless tobacco: Never  Vaping Use   Vaping Use: Never used  Substance and Sexual Activity   Alcohol use: No    Alcohol/week: 0.0 standard drinks of alcohol   Drug use: No   Sexual activity: Never  Other Topics Concern   Not on file  Social History Narrative   Not on file   Social Determinants of Health   Financial Resource Strain: Medium Risk (07/06/2021)   Overall Financial Resource Strain (CARDIA)    Difficulty of Paying Living Expenses: Somewhat hard  Food Insecurity: No Food Insecurity (12/14/2021)   Hunger Vital Sign    Worried About Running Out of Food in the Last Year: Never true    Ran Out of Food in the Last Year: Never true  Transportation Needs: No Transportation Needs (12/14/2021)   PRAPARE - Hydrologist  (Medical): No    Lack of Transportation (Non-Medical): No  Physical Activity: Inactive (05/09/2021)   Exercise Vital Sign    Days of Exercise per Week: 0 days    Minutes of Exercise per Session: 0 min  Stress: No Stress Concern Present (05/09/2021)   [place]    Feeling of Stress : Not at all  Social Connections: Moderately Isolated (05/09/2021)   Social Connection and Isolation Panel [NHANES]    Frequency of Communication with Friends and Family: More than three times a week    Frequency of Social Gatherings with Friends and Family: Once a week    Attends Religious Services: More than 4 times per year    Active Member of [employer] or Organizations: No    Attends Archivist Meetings: Never    Marital Status: Widowed  Intimate Partner Violence: Not At Risk (05/09/2021)   Humiliation, Afraid, Rape, and Kick questionnaire    Fear of Current or Ex-Partner: No    Emotionally Abused: No    Physically Abused: No    Sexually Abused: No    Past Surgical History:  Procedure Laterality Date   ABDOMINAL HYSTERECTOMY     CESAREAN SECTION     COLONOSCOPY  1997   COLONOSCOPY WITH PROPOFOL N/A 01/09/2018   Procedure: COLONOSCOPY WITH PROPOFOL;  Surgeon: Virgel Manifold, MD;  Location: [facility] ENDOSCOPY;  Service: Endoscopy;  Laterality: N/A;   COLONOSCOPY WITH PROPOFOL N/A 08/20/2019   Procedure: COLONOSCOPY WITH PROPOFOL;  Surgeon: Lucilla Lame, MD;  Location: [facility] ENDOSCOPY;  Service: Endoscopy;  Laterality: N/A;   COLOSTOMY TAKEDOWN  2012   Hartmann's procedure.     HERNIA REPAIR  2202   umbilical   LITHOTRIPSY  5427   with complications, hospitalized due to these complications for 3 months   mrsa     removal on nose   PARTIAL HYSTERECTOMY  2009    vaginal hysterectomy, has both ovaries    Family History  Problem Relation Age of Onset   Hypertension Father    Stroke Father    Diabetes Mother    Hypertension  Mother    Diabetes Other        sibling   Diabetes Other  sibling   Diabetes Other        sibling   Hypertension Other        sibling   Hypertension Other        sibling   Hypertension Other        sibling    Allergies  Allergen Reactions   Sulfamethoxazole-Trimethoprim Swelling and Hives       Latest Ref Rng & Units 07/28/2019    8:25 AM 04/20/2019    9:19 AM 11/06/2018   11:10 AM  CBC  WBC 3.4 - 10.8 x10E3/uL 6.0  5.3  5.7   Hemoglobin 11.1 - 15.9 g/dL 11.7  11.1  11.6   Hematocrit 34.0 - 46.6 % 35.1  35.6  34.3   Platelets 150 - 450 x10E3/uL 374  396  358       CMP     Component Value Date/Time   NA 146 (H) 07/05/2021 0929   NA 143 03/25/2014 0925   K 4.2 07/05/2021 0929   K 3.8 03/25/2014 0925   CL 110 (H) 07/05/2021 0929   CL 107 03/25/2014 0925   CO2 22 07/05/2021 0929   CO2 29 03/25/2014 0925   GLUCOSE 126 (H) 07/05/2021 0929   GLUCOSE 167 (H) 07/07/2017 1622   GLUCOSE 112 (H) 03/25/2014 0925   BUN 16 07/05/2021 0929   BUN 16 03/25/2014 0925   CREATININE 1.19 (H) 07/05/2021 0929   CREATININE 1.16 03/25/2014 0925   CALCIUM 9.4 07/05/2021 0929   CALCIUM 9.0 03/25/2014 0925   PROT 7.6 07/05/2021 0929   PROT 8.7 (H) 03/25/2014 0925   ALBUMIN 4.1 07/05/2021 0929   ALBUMIN 4.0 03/25/2014 0925   AST 15 07/05/2021 0929   AST 23 03/25/2014 0925   ALT 13 07/05/2021 0929   ALT 26 03/25/2014 0925   ALKPHOS 103 07/05/2021 0929   ALKPHOS 142 (H) 03/25/2014 0925   BILITOT 0.4 07/05/2021 0929   BILITOT 0.5 03/25/2014 0925   GFRNONAA 55 (L) 06/06/2020 1044   GFRNONAA 52 (L) 03/25/2014 0925   GFRAA 63 06/06/2020 1044   GFRAA >60 03/25/2014 0925     No results found.     Assessment & Plan:   1. Pain of left lower extremity Based on the patient's description of symptoms I suspect that her symptoms are not related to peripheral arterial disease but more so musculoskeletal in nature.  However in order to fully rule out a peripheral arterial disease  component we will have the patient return for ABIs as well as left lower extremity arterial duplex  2. Hyperlipidemia associated with type 2 diabetes mellitus ([place]) Continue statin as ordered and reviewed, no changes at this time   3. Spinal stenosis, lumbar region, with neurogenic claudication Based upon the patient's description of symptoms I suspect this is playing a large component to her pain.   Current Outpatient Medications on File Prior to Visit  Medication Sig Dispense Refill   acetaminophen (TYLENOL) 500 MG tablet Take 500 mg by mouth every 6 (six) hours as needed.     amLODipine (NORVASC) 10 MG tablet Take 1 tablet (10 mg total) by mouth daily. 90 tablet 1   aspirin EC (ASPIRIN LOW DOSE) 81 MG tablet TAKE ONE TABLET BY MOUTH ONCE DAILY SWALLOW whole 90 tablet 2   atorvastatin (LIPITOR) 20 MG tablet Take 1 tablet (20 mg total) by mouth daily. 90 tablet 1   Dulaglutide (TRULICITY) 3 MV/7.8IO SOPN Inject 3 mg as directed once a week. 6 mL 1  gabapentin (NEURONTIN) 300 MG capsule Take 1 capsule (300 mg total) by mouth 3 (three) times daily. 90 capsule 2   glucose blood (ONETOUCH ULTRA) test strip Use to monitor blood sugars three times daily as directed 100 each 12   Ibuprofen-diphenhydrAMINE Cit (IBUPROFEN PM PO) Take by mouth at bedtime as needed.     Lancet Devices (EASY MINI EJECT LANCING DEVICE) MISC      Lancets (ONETOUCH ULTRASOFT) lancets Use to monitor blood sugars three times daily as directed 100 each 12   metFORMIN (GLUCOPHAGE) 500 MG tablet Take 2 tablets (1,000 mg total) by mouth 2 (two) times daily with a meal. 120 tablet 11   metoprolol tartrate (LOPRESSOR) 25 MG tablet Take 1 tablet (25 mg total) by mouth 2 (two) times daily. 180 tablet 1   oxybutynin (DITROPAN XL) 10 MG 24 hr tablet Take 1 tablet (10 mg total) by mouth at bedtime. 30 tablet 11   No current facility-administered medications on file prior to visit.    There are no Patient Instructions on file for  this visit. No follow-ups on file.   Kris Hartmann, NP

## 2021-12-19 ENCOUNTER — Other Ambulatory Visit: Payer: Self-pay | Admitting: Family Medicine

## 2021-12-19 ENCOUNTER — Encounter: Payer: Self-pay | Admitting: Family Medicine

## 2021-12-19 ENCOUNTER — Ambulatory Visit (INDEPENDENT_AMBULATORY_CARE_PROVIDER_SITE_OTHER): Payer: Medicare Other | Admitting: Family Medicine

## 2021-12-19 ENCOUNTER — Ambulatory Visit: Payer: Self-pay

## 2021-12-19 VITALS — BP 119/82 | HR 81 | Temp 97.8°F | Resp 16 | Ht 69.0 in | Wt 208.0 lb

## 2021-12-19 DIAGNOSIS — R300 Dysuria: Secondary | ICD-10-CM

## 2021-12-19 DIAGNOSIS — E1159 Type 2 diabetes mellitus with other circulatory complications: Secondary | ICD-10-CM

## 2021-12-19 LAB — POCT URINALYSIS DIPSTICK
Bilirubin, UA: NEGATIVE
Blood, UA: POSITIVE
Glucose, UA: NEGATIVE
Ketones, UA: NEGATIVE
Nitrite, UA: POSITIVE
Protein, UA: POSITIVE — AB
Spec Grav, UA: 1.01 (ref 1.010–1.025)
Urobilinogen, UA: 0.2 E.U./dL
pH, UA: 6 (ref 5.0–8.0)

## 2021-12-19 MED ORDER — CIPROFLOXACIN HCL 500 MG PO TABS: 500.0000 mg | ORAL_TABLET | Freq: Two times a day (BID) | ORAL | 0 refills | Status: AC

## 2021-12-19 NOTE — Chronic Care Management (AMB) (Signed)
   12/19/2021  Emily Stokes 1960/11/07 [id number]   Documentation encounter created to complete case transition. The care management team will continue to follow for care coordination.   [facility] [phone]

## 2021-12-19 NOTE — Telephone Encounter (Signed)
Requested Prescriptions  Pending Prescriptions Disp Refills  . amLODipine (NORVASC) 10 MG tablet [Pharmacy Med Name: amlodipine 10 mg tablet] 90 tablet 0    Sig: TAKE ONE TABLET BY MOUTH EVERY MORNING     Cardiovascular: Calcium Channel Blockers 2 Passed - 12/19/2021  8:03 AM      Passed - Last BP in normal range    BP Readings from Last 1 Encounters:  12/19/21 119/82         Passed - Last Heart Rate in normal range    Pulse Readings from Last 1 Encounters:  12/19/21 81         Passed - Valid encounter within last 6 months    Recent Outpatient Visits          Today [place] [name] T, FNP   2 months ago Type 2 diabetes mellitus with hyperglycemia, without long-term current use of insulin [facility])   [facility] [name] T, FNP   5 months ago Cutaneous abscess of left upper extremity   [facility] Mecum, Erin E, PA-C   5 months ago Hypertension associated with diabetes [facility])   [facility] [name] T, FNP   8 months ago Acute cystitis without hematuria   [place], PA-C      Future Appointments            In 3 weeks Gwyneth Sprout, [place], P[facility]

## 2021-12-19 NOTE — Progress Notes (Signed)
Established patient visit   Patient: Emily Stokes   DOB: Sep 21, 1960   61 y.o. Female  MRN: [id number] Visit Date: 12/19/2021  Today's healthcare provider: Gwyneth Sprout, FNP  Introduced to nurse practitioner role and practice setting.  All questions answered.  Discussed provider/patient relationship and expectations.   I,Tiffany J Bragg,acting as a scribe for Gwyneth Sprout, FNP.,have documented all relevant documentation on the behalf of Gwyneth Sprout, FNP,as directed by  Gwyneth Sprout, FNP while in the presence of Gwyneth Sprout, FNP.   Chief Complaint  Patient presents with   Dysuria   Subjective    HPI HPI   Patient states she went to urgent care for the same symptoms starting 3 weeks ago. Was given macrobid then another antibiotic but symptoms have not resolved fully.  Last edited by Smitty Knudsen, CMA on 12/19/2021  9:09 AM.      Urinary symptoms  She reports new onset hematuria, urinary urgency, and lower back pain . The current episode started a few weeks ago and is gradually improving. Patient states symptoms are mild in intensity, occurring intermittently. She  has been recently treated for similar symptoms.    Associated symptoms: No abdominal pain Yes back pain  Yes chills No constipation  No cramping No diarrhea  No discharge No fever  Yes hematuria No nausea  No vomiting    ---------------------------------------------------------------------------------------   Medications: Outpatient Medications Prior to Visit  Medication Sig   acetaminophen (TYLENOL) 500 MG tablet Take 500 mg by mouth every 6 (six) hours as needed.   aspirin EC (ASPIRIN LOW DOSE) 81 MG tablet TAKE ONE TABLET BY MOUTH ONCE DAILY SWALLOW whole   atorvastatin (LIPITOR) 20 MG tablet Take 1 tablet (20 mg total) by mouth daily.   Dulaglutide (TRULICITY) 3 WF/0.9NA SOPN Inject 3 mg as directed once a week.   gabapentin (NEURONTIN) 300 MG capsule Take 1 capsule (300 mg total) by mouth 3  (three) times daily.   glucose blood (ONETOUCH ULTRA) test strip Use to monitor blood sugars three times daily as directed   Ibuprofen-diphenhydrAMINE Cit (IBUPROFEN PM PO) Take by mouth at bedtime as needed.   Lancet Devices (EASY MINI EJECT LANCING DEVICE) MISC    Lancets (ONETOUCH ULTRASOFT) lancets Use to monitor blood sugars three times daily as directed   metFORMIN (GLUCOPHAGE) 500 MG tablet Take 2 tablets (1,000 mg total) by mouth 2 (two) times daily with a meal.   metoprolol tartrate (LOPRESSOR) 25 MG tablet Take 1 tablet (25 mg total) by mouth 2 (two) times daily.   oxybutynin (DITROPAN XL) 10 MG 24 hr tablet Take 1 tablet (10 mg total) by mouth at bedtime.   [DISCONTINUED] amLODipine (NORVASC) 10 MG tablet Take 1 tablet (10 mg total) by mouth daily.   No facility-administered medications prior to visit.    Review of Systems  Last CBC Lab Results  Component Value Date   WBC 6.0 07/28/2019   HGB 11.7 07/28/2019   HCT 35.1 07/28/2019   MCV 91 07/28/2019   MCH 30.3 07/28/2019   RDW 16.0 (H) 07/28/2019   PLT 374 35/57/3220   Last metabolic panel Lab Results  Component Value Date   GLUCOSE 126 (H) 07/05/2021   NA 146 (H) 07/05/2021   K 4.2 07/05/2021   CL 110 (H) 07/05/2021   CO2 22 07/05/2021   BUN 16 07/05/2021   CREATININE 1.19 (H) 07/05/2021   EGFR 52 (L) 07/05/2021   CALCIUM  9.4 07/05/2021   PROT 7.6 07/05/2021   ALBUMIN 4.1 07/05/2021   LABGLOB 3.5 07/05/2021   AGRATIO 1.2 07/05/2021   BILITOT 0.4 07/05/2021   ALKPHOS 103 07/05/2021   AST 15 07/05/2021   ALT 13 07/05/2021   ANIONGAP 10 07/07/2017   Last lipids Lab Results  Component Value Date   CHOL 128 07/05/2021   HDL 36 (L) 07/05/2021   LDLCALC 79 07/05/2021   TRIG 63 07/05/2021   CHOLHDL 5.3 (H) 04/20/2019   Last hemoglobin A1c Lab Results  Component Value Date   HGBA1C 6.5 (A) 10/16/2021   Last thyroid functions Lab Results  Component Value Date   TSH 1.500 07/28/2019   Last vitamin  D No results found for: "25OHVITD2", "25OHVITD3", "VD25OH" Last vitamin B12 and Folate No results found for: "VITAMINB12", "FOLATE"     Objective    BP 119/82 (BP Location: Right Arm, Patient Position: Sitting, Cuff Size: Normal)   Pulse 81   Temp 97.8 F (36.6 C) (Oral)   Resp 16   Ht _0  (1.753 m)   Wt 208 lb (94.3 kg)   SpO2 98%   BMI 30.72 kg/m  BP Readings from Last 3 Encounters:  12/19/21 119/82  12/18/21 119/75  10/16/21 103/67   Wt Readings from Last 3 Encounters:  12/19/21 208 lb (94.3 kg)  12/18/21 207 lb (93.9 kg)  10/16/21 214 lb 3.2 oz (97.2 kg)   SpO2 Readings from Last 3 Encounters:  12/19/21 98%  10/16/21 98%  07/05/21 97%      Physical Exam Vitals and nursing note reviewed.  Constitutional:      General: She is not in acute distress.    Appearance: Normal appearance. She is obese. She is not ill-appearing, toxic-appearing or diaphoretic.  HENT:     Head: Normocephalic and atraumatic.  Cardiovascular:     Rate and Rhythm: Normal rate and regular rhythm.     Pulses: Normal pulses.     Heart sounds: Normal heart sounds. No murmur heard.    No friction rub. No gallop.  Pulmonary:     Effort: Pulmonary effort is normal. No respiratory distress.     Breath sounds: Normal breath sounds. No stridor. No wheezing, rhonchi or rales.  Chest:     Chest wall: No tenderness.  Abdominal:     General: Bowel sounds are normal. There is no distension.     Palpations: Abdomen is soft.     Tenderness: There is no abdominal tenderness. There is right CVA tenderness. There is no left CVA tenderness or rebound.  Musculoskeletal:        General: No swelling, tenderness, deformity or signs of injury. Normal range of motion.     Right lower leg: No edema.     Left lower leg: No edema.  Skin:    General: Skin is warm and dry.     Capillary Refill: Capillary refill takes less than 2 seconds.     Coloration: Skin is not jaundiced or pale.     Findings: No bruising,  erythema, lesion or rash.  Neurological:     General: No focal deficit present.     Mental Status: She is alert and oriented to person, place, and time. Mental status is at baseline.     Cranial Nerves: No cranial nerve deficit.     Sensory: No sensory deficit.     Motor: No weakness.     Coordination: Coordination normal.  Psychiatric:  Mood and Affect: Mood normal.        Behavior: Behavior normal.        Thought Content: Thought content normal.        Judgment: Judgment normal.      Results for orders placed or performed in visit on 12/19/21  POCT Urinalysis Dipstick  Result Value Ref Range   Color, UA yellow    Clarity, UA cloudy    Glucose, UA Negative Negative   Bilirubin, UA negative    Ketones, UA negative    Spec Grav, UA 1.010 1.010 - 1.025   Blood, UA positive    pH, UA 6.0 5.0 - 8.0   Protein, UA Positive (A) Negative   Urobilinogen, UA 0.2 0.2 or 1.0 E.U./dL   Nitrite, UA positive    Leukocytes, UA Moderate (2+) (A) Negative   Appearance     Odor      Assessment & Plan     Problem List Items Addressed This Visit       Other   Dysuria - Primary    Acute, recurrent Reports treatment with Macrobid and a powder antibiotic, likely Fosfomycin packet Reports symptoms have improved; however, still remain +R CVA Endorses frequency, dribbling of small amounts Repeat Urine Cx      Relevant Medications   ciprofloxacin (CIPRO) 500 MG tablet   Other Relevant Orders   POCT Urinalysis Dipstick (Completed)   Urine Culture     Return if symptoms worsen or fail to improve.      [name], FNP, have reviewed all documentation for this visit. The documentation on 12/19/21 for the exam, diagnosis, procedures, and orders are all accurate and complete.    Gwyneth Sprout, [place] [phone] (phone) [phone] (fax)  [place]

## 2021-12-19 NOTE — Assessment & Plan Note (Signed)
Acute, recurrent Reports treatment with Macrobid and a powder antibiotic, likely Fosfomycin packet Reports symptoms have improved; however, still remain +R CVA Endorses frequency, dribbling of small amounts Repeat Urine Cx

## 2021-12-19 NOTE — Patient Instructions (Signed)
Please call and schedule your mammogram:  [facility]  [address], [address] [facility] [place],  [place]  [address] Get Driving Directions Main: [phone]  Sunday:Closed Monday:7:20 AM - 5:00 PM Tuesday:7:20 AM - 5:00 PM Wednesday:7:20 AM - 5:00 PM Thursday:7:20 AM - 5:00 PM Friday:7:20 AM - 4:30 PM Saturday:Closed  

## 2021-12-25 ENCOUNTER — Telehealth: Payer: Self-pay

## 2021-12-25 LAB — URINE CULTURE

## 2021-12-25 LAB — SPECIMEN STATUS REPORT

## 2021-12-25 NOTE — Progress Notes (Signed)
Chronic Care Management Pharmacy Assistant   Name: Emily Stokes  MRN: [id number] DOB: 17-Aug-1960  Reason for Encounter: Medication Review/Medication Coordination Call for [facility]   Recent office visits:  12/19/2021 [name], [place] (PCP Office Visit) for Dysuria- Started: Ciprofloxacin HCl 500 mg twice daily, Lab orders placed, No follow-up noted  Recent consult visits:  12/18/2021 Eulogio Ditch, NP (Vascular Surgery) for Initial Visit- No medication changes noted, No orders placed, No follow-up noted  Hospital visits:  None in previous 6 months  Medications: Outpatient Encounter Medications as of 12/25/2021  Medication Sig   acetaminophen (TYLENOL) 500 MG tablet Take 500 mg by mouth every 6 (six) hours as needed.   amLODipine (NORVASC) 10 MG tablet TAKE ONE TABLET BY MOUTH EVERY MORNING   aspirin EC (ASPIRIN LOW DOSE) 81 MG tablet TAKE ONE TABLET BY MOUTH ONCE DAILY SWALLOW whole   atorvastatin (LIPITOR) 20 MG tablet Take 1 tablet (20 mg total) by mouth daily.   ciprofloxacin (CIPRO) 500 MG tablet Take 1 tablet (500 mg total) by mouth 2 (two) times daily for 7 days.   Dulaglutide (TRULICITY) 3 DE/0.8XK SOPN Inject 3 mg as directed once a week.   gabapentin (NEURONTIN) 300 MG capsule Take 1 capsule (300 mg total) by mouth 3 (three) times daily.   glucose blood (ONETOUCH ULTRA) test strip Use to monitor blood sugars three times daily as directed   Ibuprofen-diphenhydrAMINE Cit (IBUPROFEN PM PO) Take by mouth at bedtime as needed.   Lancet Devices (EASY MINI EJECT LANCING DEVICE) MISC    Lancets (ONETOUCH ULTRASOFT) lancets Use to monitor blood sugars three times daily as directed   metFORMIN (GLUCOPHAGE) 500 MG tablet Take 2 tablets (1,000 mg total) by mouth 2 (two) times daily with a meal.   metoprolol tartrate (LOPRESSOR) 25 MG tablet Take 1 tablet (25 mg total) by mouth 2 (two) times daily.   oxybutynin (DITROPAN XL) 10 MG 24 hr tablet Take 1 tablet (10 mg total) by  mouth at bedtime.   No facility-administered encounter medications on file as of 12/25/2021.   Care Gaps: Tetanus/TDAP Zoster Vaccine COVID-19 Vaccine Booster 3 Influenza Vaccine Diabetic Eye Exam Mammogram  Star Rating Drugs: Atorvastatin 20 mg last filled on 11/29/2021 for a 30-Day supply with [facility] Trulicity 3 mg last filled on 11/29/2021 for a 30-Day supply with [facility] Metformin 500 mg last filled on 11/29/2021 for a 30-Day supply with [facility]  BP Readings from Last 3 Encounters:  12/19/21 119/82  12/18/21 119/75  10/16/21 103/67    Lab Results  Component Value Date   HGBA1C 6.5 (A) 10/16/2021   Patient obtains medications through Adherence Packaging  30 Days   Last adherence delivery included:  Gabapentin 300 mg 1 capsule three times daily (Breakfast, Lunch, Evening Meals) Metformin 500 mg 2 tablets twice daily (Breakfast, Bedtime) Amlodipine 10 mg 1 tablet daily (Breakfast) Atorvastatin 20 mg 1 tablet daily (Breakfast) Aspirin 81 mg 1 tablet daily (Breakfast) Metoprolol Tartrate 25 mg 1 tablet twice daily (Breakfast, Bedtime) Trulicity Inject 3 mg into the skin once a week Oxybutynin 10 mg 1 tablet daily (Bedtime)  Patient declined the following medications for the month of July: No medications were declined  Patient is due for next adherence delivery on: 01/04/2022 2nd Route.  Called patient and reviewed medications and coordinated delivery.  This delivery to include: Gabapentin 300 mg 1 capsule three times daily (Breakfast, Lunch, Evening Meals) Metformin 500 mg 2 tablets twice daily (Breakfast, Bedtime)  Amlodipine 10 mg 1 tablet daily (Breakfast) Atorvastatin 20 mg 1 tablet daily (Breakfast) Aspirin 81 mg 1 tablet daily (Breakfast) Metoprolol Tartrate 25 mg 1 tablet twice daily (Breakfast, Bedtime) Trulicity Inject 3 mg into the skin once a week Oxybutynin 10 mg 1 tablet daily (Bedtime) OneTouch Test Strips OneTouch  Lancets  Patient declined the following medications for this months delivery: No medications were declined  Patient needs refills for: Aspirin 81 mg and Metoprolol 25 mg both are PCP medications that can be refilled by CPP  Confirmed delivery date of 01/04/2022 2nd Route, advised patient that pharmacy will contact them the morning of delivery.  Patient has a follow-up telephone appointment with Junius Argyle, CPP on 01/04/2022 @ 0830.  08/15 LVM requesting patient to return my call  [name], [place] Pharmacist Assistant Phone: [phone]

## 2021-12-25 NOTE — Progress Notes (Signed)
Cipro was appropriate antibiotic to treat the Klebsiella which was Id'd on urine cx. Please let us know if symptoms remain.  Gwyneth Sprout, [place] [address] [place], [place] [address] [phone] (phone) [phone] (fax) [place]

## 2021-12-26 ENCOUNTER — Encounter: Payer: Medicare HMO | Admitting: Physical Therapy

## 2022-01-04 ENCOUNTER — Ambulatory Visit (INDEPENDENT_AMBULATORY_CARE_PROVIDER_SITE_OTHER): Payer: Medicare Other

## 2022-01-04 DIAGNOSIS — E1165 Type 2 diabetes mellitus with hyperglycemia: Secondary | ICD-10-CM

## 2022-01-04 DIAGNOSIS — E7800 Pure hypercholesterolemia, unspecified: Secondary | ICD-10-CM

## 2022-01-04 NOTE — Progress Notes (Unsigned)
Chronic Care Management Pharmacy Note  01/10/2022 Name:  Emily Stokes MRN:  [id number] DOB:  1960-06-16  Summary: Patient presents for CCM follow-up. Blood pressure and blood sugars significantly improved.   Recommendations/Changes made from today's visit: Continue current medications  Plan: CPP follow-up in 3 months  Subjective: Emily Stokes is an 61 y.o. year old female who is a primary patient of Gwyneth Sprout, [place].  The CCM team was consulted for assistance with disease management and care coordination needs.    Engaged with patient by telephone for follow up visit in response to provider referral for pharmacy case management and/or care coordination services.   Consent to Services:  The patient was given information about Chronic Care Management services, agreed to services, and gave verbal consent prior to initiation of services.  Please see initial visit note for detailed documentation.   Patient Care Team: Gwyneth Sprout, FNP as PCP - General (Family Medicine) Pa, [place] (Optometry) Gillis Santa, MD as Consulting Physician (Pain Medicine) Neldon Labella, RN as Case Manager Germaine Pomfret, [facility] (Pharmacist)  Recent office visits: 10/16/21: Patient presented to [name], FNP for follow-up. Oxybutynin 10 mg daily.  07/17/21: Patient presented to [facility], PA-C for cutaneous abscess.  07/05/21: Patient presented to [name], FNP for follow-up.  04/19/21: Patient presented to [facility], PA-C for UTI. Macrobid.    Recent consult visits: None in past 6 months.   Hospital visits: None in past 6 months.   Objective:  Lab Results  Component Value Date   CREATININE 1.19 (H) 07/05/2021   BUN 16 07/05/2021   GFRNONAA 55 (L) 06/06/2020   GFRAA 63 06/06/2020   NA 146 (H) 07/05/2021   K 4.2 07/05/2021   CALCIUM 9.4 07/05/2021   CO2 22 07/05/2021   GLUCOSE 126 (H) 07/05/2021    Lab Results  Component Value Date/Time   HGBA1C 6.5 (A)  10/16/2021 09:38 AM   HGBA1C 7.5 (H) 07/05/2021 09:29 AM   HGBA1C 10.9 (A) 01/29/2021 09:26 AM   HGBA1C 14.9 (H) 06/06/2020 10:44 AM   MICROALBUR 50 06/06/2020 10:48 AM   MICROALBUR 20 01/30/2018 04:54 PM    Last diabetic Eye exam:  Lab Results  Component Value Date/Time   HMDIABEYEEXA No Retinopathy 09/15/2019 12:00 AM    Last diabetic Foot exam: No results found for: "HMDIABFOOTEX"   Lab Results  Component Value Date   CHOL 128 07/05/2021   HDL 36 (L) 07/05/2021   LDLCALC 79 07/05/2021   TRIG 63 07/05/2021   CHOLHDL 5.3 (H) 04/20/2019       Latest Ref Rng & Units 07/05/2021    9:29 AM 06/06/2020   10:44 AM 07/28/2019    8:25 AM  Hepatic Function  Total Protein 6.0 - 8.5 g/dL 7.6  7.4  7.4   Albumin 3.8 - 4.8 g/dL 4.1  4.0  4.4   AST 0 - 40 IU/L '15  31  17   ' ALT 0 - 32 IU/L '13  25  16   ' Alk Phosphatase 44 - 121 IU/L 103  116  114   Total Bilirubin 0.0 - 1.2 mg/dL 0.4  0.4  0.4     Lab Results  Component Value Date/Time   TSH 1.500 07/28/2019 08:25 AM   TSH 1.600 11/06/2018 11:10 AM       Latest Ref Rng & Units 07/28/2019    8:25 AM 04/20/2019    9:19 AM 11/06/2018   11:10 AM  CBC  WBC 3.4 - 10.8 x10E3/uL 6.0  5.3  5.7   Hemoglobin 11.1 - 15.9 g/dL 11.7  11.1  11.6   Hematocrit 34.0 - 46.6 % 35.1  35.6  34.3   Platelets 150 - 450 x10E3/uL 374  396  358     No results found for: "VD25OH"  Clinical ASCVD: No  The ASCVD Risk score (Arnett DK, et al., 2019) failed to calculate for the following reasons:   The valid total cholesterol range is 130 to 320 mg/dL       12/19/2021    9:47 AM 12/19/2021    9:00 AM 10/16/2021    9:23 AM  Depression screen PHQ 2/9  Decreased Interest 0 0 0  Down, Depressed, Hopeless 0 0 0  PHQ - 2 Score 0 0 0  Altered sleeping 1 0 0  Tired, decreased energy 0 1 1  Change in appetite 0 1 1  Feeling bad or failure about yourself  0 0 0  Trouble concentrating 0 0 0  Moving slowly or fidgety/restless 0 0 0  Suicidal thoughts 0 0 0   PHQ-9 Score '1 2 2  ' Difficult doing work/chores Not difficult at all Not difficult at all Not difficult at all    Social History   Tobacco Use  Smoking Status Never  Smokeless Tobacco Never   BP Readings from Last 3 Encounters:  12/19/21 119/82  12/18/21 119/75  10/16/21 103/67   Pulse Readings from Last 3 Encounters:  12/19/21 81  12/18/21 89  10/16/21 83   Wt Readings from Last 3 Encounters:  12/19/21 208 lb (94.3 kg)  12/18/21 207 lb (93.9 kg)  10/16/21 214 lb 3.2 oz (97.2 kg)   BMI Readings from Last 3 Encounters:  12/19/21 30.72 kg/m  12/18/21 30.57 kg/m  10/16/21 31.63 kg/m    Assessment/Interventions: Review of patient past medical history, allergies, medications, health status, including review of consultants reports, laboratory and other test data, was performed as part of comprehensive evaluation and provision of chronic care management services.   SDOH:  (Social Determinants of Health) assessments and interventions performed: Yes      SDOH Screenings   Alcohol Screen: Low Risk  (12/19/2021)   Alcohol Screen    Last Alcohol Screening Score (AUDIT): 0  Depression (PHQ2-9): Low Risk  (12/19/2021)   Depression (PHQ2-9)    PHQ-2 Score: 1  Financial Resource Strain: Medium Risk (07/06/2021)   Overall Financial Resource Strain (CARDIA)    Difficulty of Paying Living Expenses: Somewhat hard  Food Insecurity: No Food Insecurity (12/14/2021)   Hunger Vital Sign    Worried About Running Out of Food in the Last Year: Never true    Ran Out of Food in the Last Year: Never true  Housing: Low Risk  (05/09/2021)   Housing    Last Housing Risk Score: 0  Physical Activity: Inactive (05/09/2021)   Exercise Vital Sign    Days of Exercise per Week: 0 days    Minutes of Exercise per Session: 0 min  Social Connections: Moderately Isolated (05/09/2021)   Social Connection and Isolation Panel [NHANES]    Frequency of Communication with Friends and Family: More than three  times a week    Frequency of Social Gatherings with Friends and Family: Once a week    Attends Religious Services: More than 4 times per year    Active Member of [employer] or Organizations: No    Attends Archivist Meetings: Never    Marital Status: Widowed  Stress: No Stress Concern Present (05/09/2021)   [place]    Feeling of Stress : Not at all  Tobacco Use: Low Risk  (12/19/2021)   Patient History    Smoking Tobacco Use: Never    Smokeless Tobacco Use: Never    Passive Exposure: Not on file  Transportation Needs: No Transportation Needs (12/14/2021)   PRAPARE - Transportation    Lack of Transportation (Medical): No    Lack of Transportation (Non-Medical): No    CCM Care Plan  Allergies  Allergen Reactions   Sulfamethoxazole-Trimethoprim Swelling and Hives    Medications Reviewed Today     Reviewed by Gwyneth Sprout, FNP (Family Nurse Practitioner) on 12/19/21 at 61  Med List Status: <None>   Medication Order Taking? Sig Documenting Provider Last Dose Status Informant  acetaminophen (TYLENOL) 500 MG tablet [id number] Yes Take 500 mg by mouth every 6 (six) hours as needed. [provider] Taking Active   amLODipine (NORVASC) 10 MG tablet [id number]  TAKE ONE TABLET BY MOUTH EVERY MORNING [name] T, FNP  Active   aspirin EC (ASPIRIN LOW DOSE) 81 MG tablet [id number] Yes TAKE ONE TABLET BY MOUTH ONCE DAILY SWALLOW whole Gwyneth Sprout, FNP Taking Active   atorvastatin (LIPITOR) 20 MG tablet [id number] Yes Take 1 tablet (20 mg total) by mouth daily. Gwyneth Sprout, FNP Taking Active   ciprofloxacin (CIPRO) 500 MG tablet [id number] Yes Take 1 tablet (500 mg total) by mouth 2 (two) times daily for 7 days. Gwyneth Sprout, FNP  Active   Dulaglutide (TRULICITY) 3 ML/4.6TK [place] Aid [id number] Yes Inject 3 mg as directed once a week. Gwyneth Sprout, FNP Taking Active   gabapentin (NEURONTIN) 300 MG capsule  [id number] Yes Take 1 capsule (300 mg total) by mouth 3 (three) times daily. Gillis Santa, MD Taking Active   glucose blood (ONETOUCH ULTRA) test strip [id number] Yes Use to monitor blood sugars three times daily as directed [name] T, FNP Taking Active   Ibuprofen-diphenhydrAMINE Cit (IBUPROFEN PM PO) [id number] Yes Take by mouth at bedtime as needed. [provider] Taking Active   Lancet Devices (EASY MINI EJECT LANCING DEVICE) MISC [id number] Yes  [provider] Taking Active   Lancets Glory Rosebush ULTRASOFT) lancets [id number] Yes Use to monitor blood sugars three times daily as directed Gwyneth Sprout, FNP Taking Active   metFORMIN (GLUCOPHAGE) 500 MG tablet [id number] Yes Take 2 tablets (1,000 mg total) by mouth 2 (two) times daily with a meal. Birdie Sons, MD Taking Active   metoprolol tartrate (LOPRESSOR) 25 MG tablet [id number] Yes Take 1 tablet (25 mg total) by mouth 2 (two) times daily. Gwyneth Sprout, FNP Taking Active   oxybutynin (DITROPAN XL) 10 MG 24 hr tablet [id number] Yes Take 1 tablet (10 mg total) by mouth at bedtime. Gwyneth Sprout, FNP Taking Active   Med List Note Landis Martins, [place] 07/13/20 6811): UDS 10-21-19 MR 09/11/2020 Opiod contracts signed 02/12/18 07-13-20 PA request for Tramadol submitted. [facility]  dw            Patient Active Problem List   Diagnosis Date Noted   Dysuria 12/19/2021   OAB (overactive bladder) 10/16/2021   Pain of left lower extremity 10/16/2021   Need for influenza vaccination 07/05/2021   Hyperlipidemia associated with type 2 diabetes mellitus ([place]) 07/05/2021   Diabetic eye exam ([place]) 07/05/2021   Hypertension associated with diabetes ([place]) 07/05/2021   Need  for shingles vaccine 07/05/2021   Ataxic gait 07/05/2021   Discoloration and thickening of nails both feet 07/05/2021   Blurred vision, bilateral 07/05/2021   Spinal stenosis, lumbar region, with neurogenic claudication 04/27/2020   Personal history of  colonic polyps    Polyp of transverse colon    Lumbar facet arthropathy 03/18/2018   Lumbar spondylosis 03/18/2018   Encounter for screening colonoscopy    Internal hemorrhoids    Benign neoplasm of descending colon    Benign neoplasm of cecum    Type 2 diabetes mellitus with hyperglycemia, without long-term current use of insulin ([place]) 07/31/2017   Recurrent abdominal hernia without obstruction or gangrene 07/24/2017   Chronic pain syndrome 05/30/2016   Nasal septal abscess 05/30/2016   Lumbar degenerative disc disease 09/14/2015   Facet syndrome, lumbar 09/14/2015   Lumbar radiculopathy 09/14/2015   Sacroiliac joint dysfunction 09/14/2015   History of chicken pox 02/02/2015   Hypercholesteremia 02/02/2015   Blood glucose elevated 02/02/2015   Pain in soft tissues of limb 02/02/2015   Nonspecific ST-T changes 02/02/2015   Awareness of heartbeats 02/02/2015   Pain in extremity at multiple sites 02/02/2015   Snores 02/02/2015   Hernia of anterior abdominal wall 02/24/2012   Adnexal mass 10/30/2011   Pelvic cyst 10/30/2011   Palpitations 03/14/2011   Abnormal finding on EKG 03/14/2011   Obesity 03/14/2011   Urinary system disease 08/10/2008   Arthralgia of lower leg 03/28/2008   Anemia, iron deficiency 06/17/2007   Calculus of kidney 05/26/2007    Immunization History  Administered Date(s) Administered   Influenza Split 05/26/2007   Influenza,inj,Quad PF,6+ Mos 05/31/2016, 05/19/2017, 01/30/2018, 02/11/2020, 07/05/2021   PFIZER(Purple Top)SARS-COV-2 Vaccination 08/05/2019, 08/31/2019   Pneumococcal Polysaccharide-23 01/30/2018    Conditions to be addressed/monitored:  Hypertension, Hyperlipidemia, Diabetes, and Chronic Pain  Care Plan : General Pharmacy (Adult)  Updates made by Germaine Pomfret, RPH since 01/10/2022 12:00 AM     Problem: Hypertension, Hyperlipidemia, Diabetes, and Chronic Pain   Priority: High     Long-Range Goal: Patient-Specific Goal   Start  Date: 03/27/2021  Expected End Date: 03/27/2022  This Visit's Progress: On track  Recent Progress: On track  Priority: High  Note:   Current Barriers:  Unable to achieve control of diabetes   Pharmacist Clinical Goal(s):  Patient will maintain control of diabetes as evidenced by A1c less than 7%  through collaboration with PharmD and provider.   Interventions: 1:1 collaboration with Gwyneth Sprout, FNP regarding development and update of comprehensive plan of care as evidenced by provider attestation and co-signature Inter-disciplinary care team collaboration (see longitudinal plan of care) Comprehensive medication review performed; medication list updated in electronic medical record  Hypertension (BP goal <130/80) -Controlled: Not addressed during this visit. -Current treatment: Amlodipine 10 mg daily: Appropriate, Effective, Safe, Accessible Metoprolol 25 mg twice daily: Query appropriate -Medications previously tried: NA  -Current home readings: Does not monitor at home -Denies hypotensive/hypertensive symptoms -Questionably appropriate use of beta blocker given lack of clear secondary indication.  -Given borderline CKD, patient may benefit from ARB in place of beta blocker. Will defer for now.  -Recommended to continue current medication  Hyperlipidemia: (LDL goal < 70) -Uncontrolled, but improved.  -Current treatment: Atorvastatin 20 mg daily  -Current treatment: Aspirin 81 mg daily  -Medications previously tried: NA  -Patient close to goal, but LDL still slightly elevated.   -Continue current medications  Diabetes (A1c goal <7%) -Controlled.  -Diagnosed 2018 -Current medications: Metformin 500 mg 2  tablets twice daily: Appropriate, Effective, Safe, Accessible Trulicity 3 mg weekly on tuesdays: Appropriate, Effective, Safe, Accessible  -Medications previously tried: NA  -Current home glucose readings:  Fasting: 95, 120, 130. Highest in recall 140  -Reports   hypoglycemic symptoms: shakiness when blood sugar was 87.    -Current meal patterns: Significantly reduced appetite since starting Trulicity. Salads 2-3x weekly. Drinks mainly water. Snacks peanuts.  -Current exercise: Unable walk due to leg pain.  -Continue current medications.   Chronic Pain (Goal: Minimize pain symptoms) -Not ideally controlled: Not addressed during this visit. -Managed by Dr. Holley Raring -Large components of spinal stenosis, musculoskeletal pain.  -Current treatment  Gabapentin 300 mg three times daily  -Medications previously tried: NA  -Recommended scheduling follow-up with pain management.  -Recommended to continue current medication  Patient Goals/Self-Care Activities Patient will:  - check glucose daily before breakfast, document, and provide at future appointments  Follow Up Plan: No further follow up required: Patient will reach out for to the clinical team for further clinical questions     Medication Assistance: None required.  Patient affirms current coverage meets needs.  Compliance/Adherence/Medication fill history: Care Gaps: Zoster Vaccines PNA Vaccine COVID-19 Vaccine Booster 3 Diabetic Eye Exam Influenza Vaccine  Star-Rating Drugs: Atorvastatin 20 mg last filled on 08/31/2021 for a 30-Day supply with [facility] Metformin 500 mg  last filled on 08/31/2021 for a 30-Day supply with [facility] Trulicity 1.5 mg  last filled on 08/31/2021 for a 30-Day supply with [facility]   Patient's preferred pharmacy is:  [facility] - [place], [place] - [address] [place] [address] Phone: [phone] Fax: [phone]  [place], [place] - [place] [address] [place] [address] Phone: [phone] Fax: [phone]  Patient decided to: Utilize [facility] for medication synchronization, packaging and  delivery  Care Plan and Follow Up Patient Decision:  Patient agrees to Care Plan and Follow-up.  Plan: No further follow up required: Patient will reach out for to the clinical team for further clinical questions  Junius Argyle, PharmD, BCACP, [place] Pharmacist Practitioner  [facility] [phone]

## 2022-01-10 DIAGNOSIS — E1159 Type 2 diabetes mellitus with other circulatory complications: Secondary | ICD-10-CM | POA: Diagnosis not present

## 2022-01-10 DIAGNOSIS — Z7984 Long term (current) use of oral hypoglycemic drugs: Secondary | ICD-10-CM

## 2022-01-10 DIAGNOSIS — E785 Hyperlipidemia, unspecified: Secondary | ICD-10-CM | POA: Diagnosis not present

## 2022-01-10 DIAGNOSIS — I10 Essential (primary) hypertension: Secondary | ICD-10-CM | POA: Diagnosis not present

## 2022-01-11 DIAGNOSIS — Z1231 Encounter for screening mammogram for malignant neoplasm of breast: Secondary | ICD-10-CM | POA: Diagnosis not present

## 2022-01-11 LAB — HM MAMMOGRAPHY

## 2022-01-15 ENCOUNTER — Ambulatory Visit (INDEPENDENT_AMBULATORY_CARE_PROVIDER_SITE_OTHER): Payer: Medicare HMO | Admitting: Family Medicine

## 2022-01-15 DIAGNOSIS — Z91199 Patient's noncompliance with other medical treatment and regimen due to unspecified reason: Secondary | ICD-10-CM

## 2022-01-17 NOTE — Progress Notes (Signed)
Patient cancelled; not seen. Emily Stokes, [address] [place], [place] [address] [phone] (phone) [phone] (fax) [place]

## 2022-01-22 ENCOUNTER — Other Ambulatory Visit: Payer: Self-pay | Admitting: Student in an Organized Health Care Education/Training Program

## 2022-01-22 ENCOUNTER — Other Ambulatory Visit: Payer: Self-pay | Admitting: Family Medicine

## 2022-01-22 DIAGNOSIS — M47816 Spondylosis without myelopathy or radiculopathy, lumbar region: Secondary | ICD-10-CM

## 2022-01-22 DIAGNOSIS — G894 Chronic pain syndrome: Secondary | ICD-10-CM

## 2022-01-22 DIAGNOSIS — E7800 Pure hypercholesterolemia, unspecified: Secondary | ICD-10-CM

## 2022-01-22 DIAGNOSIS — M5416 Radiculopathy, lumbar region: Secondary | ICD-10-CM

## 2022-01-25 ENCOUNTER — Telehealth: Payer: Self-pay

## 2022-01-25 NOTE — Progress Notes (Signed)
Chronic Care Management Pharmacy Assistant   Name: Emily Stokes  MRN: [id number] DOB: May 02, 1961  Reason for Encounter: Medication Review/Medication Coordination Call for [facility]   Recent office visits:  None ID  Recent consult visits:  None ID  Hospital visits:  None in previous 6 months  Medications: Outpatient Encounter Medications as of 01/25/2022  Medication Sig   acetaminophen (TYLENOL) 500 MG tablet Take 500 mg by mouth every 6 (six) hours as needed.   amLODipine (NORVASC) 10 MG tablet TAKE ONE TABLET BY MOUTH EVERY MORNING   aspirin EC (ASPIRIN LOW DOSE) 81 MG tablet TAKE ONE TABLET BY MOUTH ONCE DAILY SWALLOW whole   atorvastatin (LIPITOR) 20 MG tablet TAKE ONE TABLET BY MOUTH ONCE DAILY   Dulaglutide (TRULICITY) 3 IW/9.7LG SOPN Inject 3 mg as directed once a week.   gabapentin (NEURONTIN) 300 MG capsule Take 1 capsule (300 mg total) by mouth 3 (three) times daily.   glucose blood (ONETOUCH ULTRA) test strip Use to monitor blood sugars three times daily as directed   Ibuprofen-diphenhydrAMINE Cit (IBUPROFEN PM PO) Take by mouth at bedtime as needed.   Lancet Devices (EASY MINI EJECT LANCING DEVICE) MISC    Lancets (ONETOUCH ULTRASOFT) lancets Use to monitor blood sugars three times daily as directed   metFORMIN (GLUCOPHAGE) 500 MG tablet Take 2 tablets (1,000 mg total) by mouth 2 (two) times daily with a meal.   metoprolol tartrate (LOPRESSOR) 25 MG tablet Take 1 tablet (25 mg total) by mouth 2 (two) times daily.   oxybutynin (DITROPAN XL) 10 MG 24 hr tablet Take 1 tablet (10 mg total) by mouth at bedtime.   No facility-administered encounter medications on file as of 01/25/2022.   Care Gaps: Tetanus/TDAP Zoster Vaccine COVID-19 Vaccine Booster 3 Influenza Vaccine Diabetic Eye Exam  Star Rating Drugs: Atorvastatin 20 mg last filled on 01/02/2022 for a 30-Day supply with [facility] Trulicity 3 mg last filled on 01/02/2022 for a 30-Day supply  with [facility] Metformin 500 mg last filled on 01/02/2022 for a 30-Day supply with [facility]  BP Readings from Last 3 Encounters:  12/19/21 119/82  12/18/21 119/75  10/16/21 103/67    Lab Results  Component Value Date   HGBA1C 6.5 (A) 10/16/2021    Patient obtains medications through Adherence Packaging  30 Days   Last adherence delivery included:  Gabapentin 300 mg 1 capsule three times daily (Breakfast, Lunch, Evening Meals) Metformin 500 mg 2 tablets twice daily (Breakfast, Bedtime) Amlodipine 10 mg 1 tablet daily (Breakfast) Atorvastatin 20 mg 1 tablet daily (Breakfast) Aspirin 81 mg 1 tablet daily (Breakfast) Metoprolol Tartrate 25 mg 1 tablet twice daily (Breakfast, Bedtime) Trulicity Inject 3 mg into the skin once a week Oxybutynin 10 mg 1 tablet daily (Bedtime) OneTouch Test Strips OneTouch Lancets  Patient declined the following medications for the month of August: No medications were declined  Patient is due for next adherence delivery on: 02/05/2022 (Tuesday) [facility].  Called patient and reviewed medications and coordinated delivery.  This delivery to include: Gabapentin 300 mg 1 capsule three times daily (Breakfast, Lunch, Evening Meals) Metformin 500 mg 2 tablets twice daily (Breakfast, Bedtime) Amlodipine 10 mg 1 tablet daily (Breakfast) Atorvastatin 20 mg 1 tablet daily (Breakfast) Aspirin 81 mg 1 tablet daily (Breakfast) Metoprolol Tartrate 25 mg 1 tablet twice daily (Breakfast, Bedtime) Trulicity Inject 3 mg into the skin once a week Oxybutynin 10 mg 1 tablet daily (Bedtime) OneTouch Test Strips OneTouch Lancets  Patient  declined the following medications for the month of September: No medications were declined  Patient needs refills for Gabapentin 300 mg this is a specialist medication and [facility] should have already sent this request.  Confirmed delivery date of 02/05/2022 [facility], advised patient that pharmacy will contact  them the morning of delivery.  No additional follow-ups with CPP are required. Patient can contact us if she needs anything.  [name], CPA/CMA Clinical Pharmacist Assistant Phone: [phone]

## 2022-01-29 ENCOUNTER — Other Ambulatory Visit (INDEPENDENT_AMBULATORY_CARE_PROVIDER_SITE_OTHER): Payer: Self-pay | Admitting: Nurse Practitioner

## 2022-01-29 DIAGNOSIS — M79605 Pain in left leg: Secondary | ICD-10-CM

## 2022-01-30 ENCOUNTER — Ambulatory Visit (INDEPENDENT_AMBULATORY_CARE_PROVIDER_SITE_OTHER): Payer: Medicare Other | Admitting: Nurse Practitioner

## 2022-01-30 ENCOUNTER — Encounter (INDEPENDENT_AMBULATORY_CARE_PROVIDER_SITE_OTHER): Payer: Medicare Other

## 2022-02-07 NOTE — Progress Notes (Signed)
I,Sha'taria Tyson,acting as a Education administrator for Gwyneth Sprout, FNP.,have documented all relevant documentation on the behalf of Gwyneth Sprout, FNP,as directed by  Gwyneth Sprout, FNP while in the presence of Gwyneth Sprout, FNP. Complete physical exam  Patient: Emily Stokes   DOB: 1960-12-25   60 y.o. Female  MRN: [id number] Visit Date: 02/08/2022  Today's healthcare provider: Gwyneth Sprout, FNP  Re Introduced to nurse practitioner role and practice setting.  All questions answered.  Discussed provider/patient relationship and expectations.  Subjective    Emily Stokes is a 61 y.o. female who presents today for a complete physical exam.  She reports consuming a general diet. The patient does not participate in regular exercise at present. She generally feels well. She reports sleeping fairly well. She does not have additional problems to discuss today.   HPI   Past Medical History:  Diagnosis Date   Abdominal pain, epigastric    Acute cystitis without hematuria 04/19/2021   Anemia    Backache    Bronchitis    Calculus, kidney    Carpal tunnel syndrome    Cellulitis of face 05/30/2016   Chest pain    Chicken pox    Circulatory disease    Diabetes mellitus without complication (HCC)    Disorder of kidney and ureter    Excess, menstruation    Hypercholesteremia    Hypertension, essential, benign    Infected postoperative seroma    Infected surgical wound 09/15/2008   Iron deficiency    Leg pain 02/02/2015   Malaise and fatigue    Measles    MRSA (methicillin resistant staph aureus) culture positive    Nausea alone    Nonspecific abnormal electrocardiogram (ECG) (EKG)    Pain in elbow 02/05/2016   Pain in joint, lower leg    Pain in shoulder 12/05/2011   Sleep apnea    Past Surgical History:  Procedure Laterality Date   ABDOMINAL HYSTERECTOMY     CESAREAN SECTION     COLONOSCOPY  1997   COLONOSCOPY WITH PROPOFOL N/A 01/09/2018   Procedure: COLONOSCOPY WITH PROPOFOL;   Surgeon: Virgel Manifold, MD;  Location: [facility] ENDOSCOPY;  Service: Endoscopy;  Laterality: N/A;   COLONOSCOPY WITH PROPOFOL N/A 08/20/2019   Procedure: COLONOSCOPY WITH PROPOFOL;  Surgeon: Lucilla Lame, MD;  Location: [facility] ENDOSCOPY;  Service: Endoscopy;  Laterality: N/A;   COLOSTOMY TAKEDOWN  2012   Hartmann's procedure.     HERNIA REPAIR  [id number]   umbilical   LITHOTRIPSY  [id number]   with complications, hospitalized due to these complications for 3 months   mrsa     removal on nose   PARTIAL HYSTERECTOMY  2009    vaginal hysterectomy, has both ovaries   Social History   Socioeconomic History   Marital status: Widowed    Spouse name: Not on file   Number of children: 4   Years of education: Not on file   Highest education level: Bachelor's degree (e.g., BA, AB, BS)  Occupational History   Occupation: Control and instrumentation engineer    Comment: disability  Tobacco Use   Smoking status: Never   Smokeless tobacco: Never  Vaping Use   Vaping Use: Never used  Substance and Sexual Activity   Alcohol use: Never   Drug use: Never   Sexual activity: Never  Other Topics Concern   Not on file  Social History Narrative   Not on file   Social Determinants of Health   Financial  Resource Strain: Medium Risk (07/06/2021)   Overall Financial Resource Strain (CARDIA)    Difficulty of Paying Living Expenses: Somewhat hard  Food Insecurity: No Food Insecurity (12/14/2021)   Hunger Vital Sign    Worried About Running Out of Food in the Last Year: Never true    Ran Out of Food in the Last Year: Never true  Transportation Needs: No Transportation Needs (12/14/2021)   PRAPARE - Hydrologist (Medical): No    Lack of Transportation (Non-Medical): No  Physical Activity: Inactive (05/09/2021)   Exercise Vital Sign    Days of Exercise per Week: 0 days    Minutes of Exercise per Session: 0 min  Stress: No Stress Concern Present (05/09/2021)   [place]    Feeling of Stress : Not at all  Social Connections: Moderately Isolated (05/09/2021)   Social Connection and Isolation Panel [NHANES]    Frequency of Communication with Friends and Family: More than three times a week    Frequency of Social Gatherings with Friends and Family: Once a week    Attends Religious Services: More than 4 times per year    Active Member of [employer] or Organizations: No    Attends Archivist Meetings: Never    Marital Status: Widowed  Intimate Partner Violence: Not At Risk (05/09/2021)   Humiliation, Afraid, Rape, and Kick questionnaire    Fear of Current or Ex-Partner: No    Emotionally Abused: No    Physically Abused: No    Sexually Abused: No   Family Status  Relation Name Status   Father Sinclair Ship (Not Specified)   Mother Janett Labella (Not Specified)   Other  (Not Specified)   Other  (Not Specified)   Other  (Not Specified)   Other  (Not Specified)   Other  (Not Specified)   Other  (Not Specified)   Family History  Problem Relation Age of Onset   Hypertension Father    Stroke Father    Diabetes Mother    Hypertension Mother    Diabetes Other        sibling   Diabetes Other        sibling   Diabetes Other        sibling   Hypertension Other        sibling   Hypertension Other        sibling   Hypertension Other        sibling   Allergies  Allergen Reactions   Sulfamethoxazole-Trimethoprim Swelling and Hives    Patient Care Team: Gwyneth Sprout, FNP as PCP - General (Family Medicine) Pa, [place] (Optometry) Gillis Santa, MD as Consulting Physician (Pain Medicine) Neldon Labella, RN as Case Manager Germaine Pomfret, [facility] (Pharmacist)   Medications: Outpatient Medications Prior to Visit  Medication Sig   acetaminophen (TYLENOL) 500 MG tablet Take 500 mg by mouth every 6 (six) hours as needed.   aspirin EC (ASPIRIN LOW DOSE) 81 MG tablet TAKE ONE TABLET BY MOUTH ONCE  DAILY SWALLOW whole   atorvastatin (LIPITOR) 20 MG tablet TAKE ONE TABLET BY MOUTH ONCE DAILY   Dulaglutide (TRULICITY) 3 JW/1.1BJ SOPN Inject 3 mg as directed once a week.   glucose blood (ONETOUCH ULTRA) test strip Use to monitor blood sugars three times daily as directed   Ibuprofen-diphenhydrAMINE Cit (IBUPROFEN PM PO) Take by mouth at bedtime as needed.   Lancet Devices (EASY  MINI EJECT LANCING DEVICE) MISC    Lancets (ONETOUCH ULTRASOFT) lancets Use to monitor blood sugars three times daily as directed   metFORMIN (GLUCOPHAGE) 500 MG tablet Take 2 tablets (1,000 mg total) by mouth 2 (two) times daily with a meal.   metoprolol tartrate (LOPRESSOR) 25 MG tablet Take 1 tablet (25 mg total) by mouth 2 (two) times daily.   oxybutynin (DITROPAN XL) 10 MG 24 hr tablet Take 1 tablet (10 mg total) by mouth at bedtime.   [DISCONTINUED] amLODipine (NORVASC) 10 MG tablet TAKE ONE TABLET BY MOUTH EVERY MORNING   gabapentin (NEURONTIN) 300 MG capsule Take 1 capsule (300 mg total) by mouth 3 (three) times daily.   No facility-administered medications prior to visit.    Review of Systems  Last CBC Lab Results  Component Value Date   WBC 6.0 07/28/2019   HGB 11.7 07/28/2019   HCT 35.1 07/28/2019   MCV 91 07/28/2019   MCH 30.3 07/28/2019   RDW 16.0 (H) 07/28/2019   PLT 374 77/41/2878   Last metabolic panel Lab Results  Component Value Date   GLUCOSE 126 (H) 07/05/2021   NA 146 (H) 07/05/2021   K 4.2 07/05/2021   CL 110 (H) 07/05/2021   CO2 22 07/05/2021   BUN 16 07/05/2021   CREATININE 1.19 (H) 07/05/2021   EGFR 52 (L) 07/05/2021   CALCIUM 9.4 07/05/2021   PROT 7.6 07/05/2021   ALBUMIN 4.1 07/05/2021   LABGLOB 3.5 07/05/2021   AGRATIO 1.2 07/05/2021   BILITOT 0.4 07/05/2021   ALKPHOS 103 07/05/2021   AST 15 07/05/2021   ALT 13 07/05/2021   ANIONGAP 10 07/07/2017   Last lipids Lab Results  Component Value Date   CHOL 128 07/05/2021   HDL 36 (L) 07/05/2021   LDLCALC 79  07/05/2021   TRIG 63 07/05/2021   CHOLHDL 5.3 (H) 04/20/2019   Last hemoglobin A1c Lab Results  Component Value Date   HGBA1C 6.5 (A) 10/16/2021   Last thyroid functions Lab Results  Component Value Date   TSH 1.500 07/28/2019    Objective     BP 117/68 (BP Location: Right Arm, Patient Position: Sitting, Cuff Size: Large)   Pulse 84   Ht '5\' 9"'  (1.753 m)   Wt 204 lb 14.4 oz (92.9 kg)   SpO2 95%   BMI 30.26 kg/m   BP Readings from Last 3 Encounters:  02/08/22 117/68  12/19/21 119/82  12/18/21 119/75   Wt Readings from Last 3 Encounters:  02/08/22 204 lb 14.4 oz (92.9 kg)  12/19/21 208 lb (94.3 kg)  12/18/21 207 lb (93.9 kg)   Physical Exam Vitals and nursing note reviewed.  Constitutional:      General: She is awake. She is not in acute distress.    Appearance: Normal appearance. She is well-developed and well-groomed. She is obese. She is not ill-appearing, toxic-appearing or diaphoretic.  HENT:     Head: Normocephalic and atraumatic.     Jaw: There is normal jaw occlusion. No trismus, tenderness, swelling or pain on movement.     Right Ear: Hearing, tympanic membrane, ear canal and external ear normal. There is no impacted cerumen.     Left Ear: Hearing, tympanic membrane, ear canal and external ear normal. There is no impacted cerumen.     Nose: Nose normal. No congestion or rhinorrhea.     Right Turbinates: Not enlarged, swollen or pale.     Left Turbinates: Not enlarged, swollen or pale.  Right Sinus: No maxillary sinus tenderness or frontal sinus tenderness.     Left Sinus: No maxillary sinus tenderness or frontal sinus tenderness.     Mouth/Throat:     Lips: Pink.     Mouth: Mucous membranes are moist. No injury.     Tongue: No lesions.     Pharynx: Oropharynx is clear. Uvula midline. No pharyngeal swelling, oropharyngeal exudate, posterior oropharyngeal erythema or uvula swelling.     Tonsils: No tonsillar exudate or tonsillar abscesses.  Eyes:      General: Lids are normal. Lids are everted, no foreign bodies appreciated. Vision grossly intact. Gaze aligned appropriately. No allergic shiner or visual field deficit.       Right eye: No discharge.        Left eye: No discharge.     Extraocular Movements: Extraocular movements intact.     Conjunctiva/sclera: Conjunctivae normal.     Right eye: Right conjunctiva is not injected. No exudate.    Left eye: Left conjunctiva is not injected. No exudate.    Pupils: Pupils are equal, round, and reactive to light.  Neck:     Thyroid: No thyroid mass, thyromegaly or thyroid tenderness.     Vascular: No carotid bruit.     Trachea: Trachea normal.  Cardiovascular:     Rate and Rhythm: Normal rate and regular rhythm.     Pulses: Normal pulses.          Carotid pulses are 2+ on the right side and 2+ on the left side.      Radial pulses are 2+ on the right side and 2+ on the left side.       Dorsalis pedis pulses are 2+ on the right side and 2+ on the left side.       Posterior tibial pulses are 2+ on the right side and 2+ on the left side.     Heart sounds: Normal heart sounds, S1 normal and S2 normal. No murmur heard.    No friction rub. No gallop.  Pulmonary:     Effort: Pulmonary effort is normal. No respiratory distress.     Breath sounds: Normal breath sounds and air entry. No stridor. No wheezing, rhonchi or rales.  Chest:     Chest wall: No tenderness.     Comments: Breasts: risk and benefit of breast self-exam was discussed, not examined  Abdominal:     General: Abdomen is flat. Bowel sounds are normal. There is no distension.     Palpations: Abdomen is soft. There is no mass.     Tenderness: There is no abdominal tenderness. There is no right CVA tenderness, left CVA tenderness, guarding or rebound.     Hernia: No hernia is present.  Genitourinary:    Comments: Exam deferred; denies complaints Musculoskeletal:        General: No swelling, tenderness, deformity or signs of injury.  Normal range of motion.     Cervical back: Full passive range of motion without pain, normal range of motion and neck supple. No edema, rigidity or tenderness. No muscular tenderness.     Right lower leg: No edema.     Left lower leg: No edema.  Lymphadenopathy:     Cervical: No cervical adenopathy.     Right cervical: No superficial, deep or posterior cervical adenopathy.    Left cervical: No superficial, deep or posterior cervical adenopathy.  Skin:    General: Skin is warm and dry.     Capillary Refill: Capillary  refill takes less than 2 seconds.     Coloration: Skin is not jaundiced or pale.     Findings: No bruising, erythema, lesion or rash.  Neurological:     General: No focal deficit present.     Mental Status: She is alert and oriented to person, place, and time. Mental status is at baseline.     GCS: GCS eye subscore is 4. GCS verbal subscore is 5. GCS motor subscore is 6.     Sensory: Sensation is intact. No sensory deficit.     Motor: Motor function is intact. No weakness.     Coordination: Coordination is intact. Coordination normal.     Gait: Gait is intact. Gait normal.  Psychiatric:        Attention and Perception: Attention and perception normal.        Mood and Affect: Mood and affect normal.        Speech: Speech normal.        Behavior: Behavior normal. Behavior is cooperative.        Thought Content: Thought content normal.        Cognition and Memory: Cognition and memory normal.        Judgment: Judgment normal.       Last depression screening scores    02/08/2022    8:46 AM 12/19/2021    9:47 AM 12/19/2021    9:00 AM  PHQ 2/9 Scores  PHQ - 2 Score 0 0 0  PHQ- 9 Score '1 1 2   ' Last fall risk screening    02/08/2022    8:48 AM  Fall Risk   Falls in the past year? 0  Number falls in past yr: 0  Injury with Fall? 1   Last Audit-C alcohol use screening    02/08/2022    8:48 AM  Alcohol Use Disorder Test (AUDIT)  1. How often do you have a drink  containing alcohol? 0  2. How many drinks containing alcohol do you have on a typical day when you are drinking? 0  3. How often do you have six or more drinks on one occasion? 0  AUDIT-C Score 0   A score of 3 or more in women, and 4 or more in men indicates increased risk for alcohol abuse, EXCEPT if all of the points are from question 1   No results found for any visits on 02/08/22.  Assessment & Plan    Routine Health Maintenance and Physical Exam  Exercise Activities and Dietary recommendations  Goals      Arrange clinic follow up     Care Coordination Interventions: Patient reports recently being evaluated in Urgent Care for possible UTI. Fosfomycin was prescribed. Reports contacting the clinic for PCP follow-up however appointments were not available. Requesting assistance with arranging a provider visit. Will contact the clinic schedulers and follow up this week. Update 12/14/21: Patient was scheduled for an acute clinic visit today. Reports being unable to attend d/t assisting with family members in the home. Appointment rescheduled for 12/19/21.  Reviewed indications for seeking care if symptoms return over the weekend.      DIET - EAT MORE FRUITS AND VEGETABLES     Exercise 2x per week (45 min per time)     Recommend increasing exercise weekly. Pt to start physical therapy 2 times a week for 45 minutes.      Have 3 meals a day     Recommend to start eating 3 meals a day  with 2 healthy snacks in between.     LIFESTYLE - DECREASE FALLS RISK     Recommend to remove any items from the home that may cause slips or trips.     Monitor and Manage My Blood Sugar-Diabetes Type 2     Timeframe:  Long-Range Goal Priority:  High Start Date: 03/27/2021                            Expected End Date: 03/27/2022                      Follow Up within 30 days   - check blood sugar daily before breakfast - check blood sugar if I feel it is too high or too low - enter blood sugar  readings and medication or insulin into daily log - take the blood sugar log to all doctor visits    Why is this important?   Checking your blood sugar at home helps to keep it from getting very high or very low.  Writing the results in a diary or log helps the doctor know how to care for you.  Your blood sugar log should have the time, date and the results.  Also, write down the amount of insulin or other medicine that you take.  Other information, like what you ate, exercise done and how you were feeling, will also be helpful.     Notes:         Immunization History  Administered Date(s) Administered   Influenza Split 05/26/2007   Influenza,inj,Quad PF,6+ Mos 05/31/2016, 05/19/2017, 01/30/2018, 02/11/2020, 07/05/2021   PFIZER(Purple Top)SARS-COV-2 Vaccination 08/05/2019, 08/31/2019   Pneumococcal Polysaccharide-23 01/30/2018    Health Maintenance  Topic Date Due   OPHTHALMOLOGY EXAM  09/14/2020   COVID-19 Vaccine (3 - Pfizer risk series) 02/24/2022 (Originally 09/28/2019)   Zoster Vaccines- Shingrix (1 of 2) 05/10/2022 (Originally [birth year]/24/1978)   INFLUENZA VACCINE  08/11/2022 (Originally 12/11/2021)   TETANUS/TDAP  02/09/2023 (Originally 11/24/1978)   HEMOGLOBIN A1C  04/17/2022   Diabetic kidney evaluation - GFR measurement  07/05/2022   Diabetic kidney evaluation - Urine ACR  07/05/2022   FOOT EXAM  07/05/2022   MAMMOGRAM  01/12/2024   COLONOSCOPY (Pts 45-31yr Insurance coverage will need to be confirmed)  08/19/2024   Hepatitis C Screening  Completed   HIV Screening  Completed   HPV VACCINES  Aged Out    Discussed health benefits of physical activity, and encouraged her to engage in regular exercise appropriate for her age and condition.  Problem List Items Addressed This Visit       Cardiovascular and Mediastinum   Hy[place] associated with diabetes ([place]    Chronic, stable At goal of <130/<80 Check CMP Currently taking 10 mg norvasx, metop 25 mg BID       Relevant Medications   amLODipine (NORVASC) 10 MG tablet     Endocrine   Hyperlipidemia associated with type 2 diabetes mellitus (HCC)    Chronic, previously elevated with LDL of 79 Goal of 55-70 Repeat LP Continue statin lipitor 20      Relevant Medications   amLODipine (NORVASC) 10 MG tablet   Type 2 diabetes mellitus with hyperglycemia, without long-term current use of insulin (HCC)    Chronic, stable Continue to recommend balanced, lower carb meals. Smaller meal size, adding snacks. Choosing water as drink of choice and increasing purposeful exercise. Currently taking trulicity 3 mg/wk; metformin 1000 mg  BID Due for eye exam; encouraged to call and schedule On lipitor 20        Other   Annual physical exam - Primary    Pt due for dental and vision Things to do to keep yourself healthy  - Exercise at least 30-45 minutes a day, 3-4 days a week.  - Eat a low-fat diet with lots of fruits and vegetables, up to 7-9 servings per day.  - Seatbelts can save your life. Wear them always.  - Smoke detectors on every level of your home, check batteries every year.  - Eye Doctor - have an eye exam every 1-2 years  - Safe sex - if you may be exposed to STDs, use a condom.  - Alcohol -  If you drink, do it moderately, less than 2 drinks per day.  - [place]. Choose someone to speak for you if you are not able.  - Depression is common in our stressful world.If you're feeling down or losing interest in things you normally enjoy, please come in for a visit.  - Violence - If anyone is threatening or hurting you, please call immediately.        Relevant Orders   Lipid panel   Comprehensive metabolic panel   CBC with Differential/Platelet   HgB A1c   TSH + free T4   Influenza vaccination administered at current visit    Consented; VIS made available; no immediate side effects following administration; plan to repeat annually       Relevant Orders   Flu Vaccine  QUAD 6+ mos PF IM (Fluarix Quad PF)   Urinary frequency    Chronic, waxes/wanes Refer to uro Previously stable on ditropan 10 mg      Relevant Orders   Ambulatory referral to Urology     No follow-ups on file.     [name], FNP, have reviewed all documentation for this visit. The documentation on 02/08/22 for the exam, diagnosis, procedures, and orders are all accurate and complete.    Gwyneth Sprout, [place] [phone] (phone) [phone] (fax)  [place]

## 2022-02-07 NOTE — Patient Instructions (Addendum)
The CDC recommends two doses of Shingrix (the shingles vaccine) separated by 2 to 6 months for adults age 62 years and older. I recommend checking with your insurance plan regarding coverage for this vaccine.  Please contact [facility] to schedule your diabetic eye exam at your earliest convenience.

## 2022-02-08 ENCOUNTER — Ambulatory Visit (INDEPENDENT_AMBULATORY_CARE_PROVIDER_SITE_OTHER): Payer: Medicare Other | Admitting: Family Medicine

## 2022-02-08 ENCOUNTER — Encounter: Payer: Self-pay | Admitting: Family Medicine

## 2022-02-08 VITALS — BP 117/68 | HR 84 | Ht 69.0 in | Wt 204.9 lb

## 2022-02-08 DIAGNOSIS — E1169 Type 2 diabetes mellitus with other specified complication: Secondary | ICD-10-CM | POA: Diagnosis not present

## 2022-02-08 DIAGNOSIS — E1159 Type 2 diabetes mellitus with other circulatory complications: Secondary | ICD-10-CM | POA: Diagnosis not present

## 2022-02-08 DIAGNOSIS — R350 Frequency of micturition: Secondary | ICD-10-CM | POA: Diagnosis not present

## 2022-02-08 DIAGNOSIS — I152 Hypertension secondary to endocrine disorders: Secondary | ICD-10-CM | POA: Diagnosis not present

## 2022-02-08 DIAGNOSIS — Z0000 Encounter for general adult medical examination without abnormal findings: Secondary | ICD-10-CM | POA: Insufficient documentation

## 2022-02-08 DIAGNOSIS — Z23 Encounter for immunization: Secondary | ICD-10-CM

## 2022-02-08 DIAGNOSIS — E1165 Type 2 diabetes mellitus with hyperglycemia: Secondary | ICD-10-CM | POA: Diagnosis not present

## 2022-02-08 DIAGNOSIS — E785 Hyperlipidemia, unspecified: Secondary | ICD-10-CM

## 2022-02-08 MED ORDER — AMLODIPINE BESYLATE 10 MG PO TABS: 10.0000 mg | ORAL_TABLET | Freq: Every morning | ORAL | 11 refills | Status: DC

## 2022-02-08 NOTE — Assessment & Plan Note (Signed)
Pt due for dental and vision Things to do to keep yourself healthy  - Exercise at least 30-45 minutes a day, 3-4 days a week.  - Eat a low-fat diet with lots of fruits and vegetables, up to 7-9 servings per day.  - Seatbelts can save your life. Wear them always.  - Smoke detectors on every level of your home, check batteries every year.  - Eye Doctor - have an eye exam every 1-2 years  - Safe sex - if you may be exposed to STDs, use a condom.  - Alcohol -  If you drink, do it moderately, less than 2 drinks per day.  - [place]. Choose someone to speak for you if you are not able.  - Depression is common in our stressful world.If you're feeling down or losing interest in things you normally enjoy, please come in for a visit.  - Violence - If anyone is threatening or hurting you, please call immediately.

## 2022-02-08 NOTE — Assessment & Plan Note (Signed)
Chronic, stable Continue to recommend balanced, lower carb meals. Smaller meal size, adding snacks. Choosing water as drink of choice and increasing purposeful exercise. Currently taking trulicity 3 mg/wk; metformin 1000 mg BID Due for eye exam; encouraged to call and schedule On lipitor 20

## 2022-02-08 NOTE — Assessment & Plan Note (Signed)
Chronic, previously elevated with LDL of 79 Goal of 55-70 Repeat LP Continue statin lipitor 20

## 2022-02-08 NOTE — Assessment & Plan Note (Signed)
Chronic, stable At goal of <130/<80 Check CMP Currently taking 10 mg norvasx, metop 25 mg BID

## 2022-02-08 NOTE — Assessment & Plan Note (Signed)
Chronic, waxes/wanes Refer to uro Previously stable on ditropan 10 mg

## 2022-02-08 NOTE — Assessment & Plan Note (Signed)
Consented; VIS made available; no immediate side effects following administration; plan to repeat annually   

## 2022-02-09 ENCOUNTER — Other Ambulatory Visit: Payer: Self-pay | Admitting: Family Medicine

## 2022-02-09 DIAGNOSIS — N1831 Chronic kidney disease, stage 3a: Secondary | ICD-10-CM

## 2022-02-09 LAB — COMPREHENSIVE METABOLIC PANEL
ALT: 9 IU/L (ref 0–32)
AST: 12 IU/L (ref 0–40)
Albumin/Globulin Ratio: 1.4 (ref 1.2–2.2)
Albumin: 4.4 g/dL (ref 3.9–4.9)
Alkaline Phosphatase: 95 IU/L (ref 44–121)
BUN/Creatinine Ratio: 15 (ref 12–28)
BUN: 18 mg/dL (ref 8–27)
Bilirubin Total: 0.4 mg/dL (ref 0.0–1.2)
CO2: 23 mmol/L (ref 20–29)
Calcium: 9.7 mg/dL (ref 8.7–10.3)
Chloride: 103 mmol/L (ref 96–106)
Creatinine, Ser: 1.24 mg/dL — ABNORMAL HIGH (ref 0.57–1.00)
Globulin, Total: 3.1 g/dL (ref 1.5–4.5)
Glucose: 95 mg/dL (ref 70–99)
Potassium: 4.3 mmol/L (ref 3.5–5.2)
Sodium: 140 mmol/L (ref 134–144)
Total Protein: 7.5 g/dL (ref 6.0–8.5)
eGFR: 49 mL/min/{1.73_m2} — ABNORMAL LOW (ref >59)

## 2022-02-09 LAB — CBC WITH DIFFERENTIAL/PLATELET
Basophils Absolute: 0 10*3/uL (ref 0.0–0.2)
Basos: 1 %
EOS (ABSOLUTE): 0.2 10*3/uL (ref 0.0–0.4)
Eos: 4 %
Hematocrit: 34.7 % (ref 34.0–46.6)
Hemoglobin: 11.2 g/dL (ref 11.1–15.9)
Immature Grans (Abs): 0 10*3/uL (ref 0.0–0.1)
Immature Granulocytes: 0 %
Lymphocytes Absolute: 2 10*3/uL (ref 0.7–3.1)
Lymphs: 41 %
MCH: 31.2 pg (ref 26.6–33.0)
MCHC: 32.3 g/dL (ref 31.5–35.7)
MCV: 97 fL (ref 79–97)
Monocytes Absolute: 0.4 10*3/uL (ref 0.1–0.9)
Monocytes: 8 %
Neutrophils Absolute: 2.2 10*3/uL (ref 1.4–7.0)
Neutrophils: 46 %
Platelets: 303 10*3/uL (ref 150–450)
RBC: 3.59 x10E6/uL — ABNORMAL LOW (ref 3.77–5.28)
RDW: 13.6 % (ref 11.7–15.4)
WBC: 4.8 10*3/uL (ref 3.4–10.8)

## 2022-02-09 LAB — LIPID PANEL
Chol/HDL Ratio: 4.5 ratio — ABNORMAL HIGH (ref 0.0–4.4)
Cholesterol, Total: 126 mg/dL (ref 100–199)
HDL: 28 mg/dL — ABNORMAL LOW (ref >39)
LDL Chol Calc (NIH): 83 mg/dL (ref 0–99)
Triglycerides: 73 mg/dL (ref 0–149)
VLDL Cholesterol Cal: 15 mg/dL (ref 5–40)

## 2022-02-09 LAB — HEMOGLOBIN A1C
Est. average glucose Bld gHb Est-mCnc: 126 mg/dL
Hgb A1c MFr Bld: 6 % — ABNORMAL HIGH (ref 4.8–5.6)

## 2022-02-09 LAB — TSH+FREE T4
Free T4: 1.37 ng/dL (ref 0.82–1.77)
TSH: 1.9 u[IU]/mL (ref 0.450–4.500)

## 2022-02-09 NOTE — Progress Notes (Signed)
Cholesterol is stable; however, interval change shows decrease in good/HDL cholesterol. I recommend diet low in saturated fat and regular exercise - 30 min at least 5 times per week  Blood chemistry show slightly worse kidney function; recommend 64 oz/water per day and second opinion of nephrology.  A1c is improved at 6% which shows great glycemic control. OK for 6 month f/u. Continue to recommend balanced, lower carb meals. Smaller meal size, adding snacks. Choosing water as drink of choice and increasing purposeful exercise.  Normal thyroid.  Please let us know if you are not contacted for urology referral in the next 2 weeks.  Gwyneth Sprout, [address] [place], [place] [address] [phone] (phone) [phone] (fax) [place]

## 2022-02-11 ENCOUNTER — Telehealth: Payer: Self-pay

## 2022-02-11 DIAGNOSIS — N1831 Chronic kidney disease, stage 3a: Secondary | ICD-10-CM

## 2022-02-11 NOTE — Telephone Encounter (Signed)
-----   Message from Gwyneth Sprout, FNP sent at 02/09/2022  9:58 AM EDT ----- Cholesterol is stable; however, interval change shows decrease in good/HDL cholesterol. I recommend diet low in saturated fat and regular exercise - 30 min at least 5 times per week  Blood chemistry show slightly worse kidney function; recommend 64 oz/water per day and second opinion of nephrology.  A1c is improved at 6% which shows great glycemic control. OK for 6 month f/u. Continue to recommend balanced, lower carb meals. Smaller meal size, adding snacks. Choosing water as drink of choice and increasing purposeful exercise.  Normal thyroid.  Please let us know if you are not contacted for urology referral in the next 2 weeks.  Gwyneth Sprout, [address] [place], [place] [address] [phone] (phone) [phone] (fax) [place]

## 2022-02-11 NOTE — Telephone Encounter (Signed)
Patient advised. Patient agreed to referral to nephrology.

## 2022-02-12 ENCOUNTER — Telehealth: Payer: Self-pay

## 2022-02-12 DIAGNOSIS — I152 Hypertension secondary to endocrine disorders: Secondary | ICD-10-CM

## 2022-02-12 NOTE — Progress Notes (Signed)
    Chronic Care Management Pharmacy Assistant   Name: Emily Stokes  MRN: [id number] DOB: July 12, 1960  Reason for Encounter: Medication Review/Medication Instructions   I received a task from Junius Argyle, CPP requesting that I contact the patient and inform her of the following:  Per CPP he spoke with the patient's PCP and they would like her to do the following after reviewing her recent lab work:  In order to protect her kidneys I would recommend we start a new medication for her blood pressure called telmisartan. In addition, I would recommend we decrease her amlodipine to 5 mg daily and stop her metoprolol entirely so her blood pressure does not drop too low.    Can you please discuss these changes with [name] and see if she is agreeable to them? If so I will send in new prescriptions and have the pharmacy send her a short supply until her next scheduled delivery later this month.   I spoke with the patient and provided her with all the above instructions. Patient instructed to decrease her Amlodipine to 5 mg daily, Discontinue her Metoprolol, and start Telmisartan daily. Patient instructed that CPP would send new prescriptions to [facility] and have them deliver a short supply until her next scheduled delivery. Patient also text the following instructions just in case she doesn't remember everything we discussed. Patient instructed to give me a call back directly @ [phone] if she has any questions. Patient verbalized understanding to all.  Medications: Outpatient Encounter Medications as of 02/12/2022  Medication Sig   acetaminophen (TYLENOL) 500 MG tablet Take 500 mg by mouth every 6 (six) hours as needed.   amLODipine (NORVASC) 10 MG tablet Take 1 tablet (10 mg total) by mouth every morning.   aspirin EC (ASPIRIN LOW DOSE) 81 MG tablet TAKE ONE TABLET BY MOUTH ONCE DAILY SWALLOW whole   atorvastatin (LIPITOR) 20 MG tablet TAKE ONE TABLET BY MOUTH ONCE DAILY   Dulaglutide  (TRULICITY) 3 XB/9.3JQ SOPN Inject 3 mg as directed once a week.   gabapentin (NEURONTIN) 300 MG capsule Take 1 capsule (300 mg total) by mouth 3 (three) times daily.   glucose blood (ONETOUCH ULTRA) test strip Use to monitor blood sugars three times daily as directed   Ibuprofen-diphenhydrAMINE Cit (IBUPROFEN PM PO) Take by mouth at bedtime as needed.   Lancet Devices (EASY MINI EJECT LANCING DEVICE) MISC    Lancets (ONETOUCH ULTRASOFT) lancets Use to monitor blood sugars three times daily as directed   metFORMIN (GLUCOPHAGE) 500 MG tablet Take 2 tablets (1,000 mg total) by mouth 2 (two) times daily with a meal.   metoprolol tartrate (LOPRESSOR) 25 MG tablet Take 1 tablet (25 mg total) by mouth 2 (two) times daily.   oxybutynin (DITROPAN XL) 10 MG 24 hr tablet Take 1 tablet (10 mg total) by mouth at bedtime.   No facility-administered encounter medications on file as of 02/12/2022.    [name], CPA/CMA Clinical Pharmacist Assistant Phone: [phone]

## 2022-02-13 MED ORDER — AMLODIPINE BESYLATE 5 MG PO TABS: 5.0000 mg | ORAL_TABLET | Freq: Every morning | ORAL | 2 refills | Status: DC

## 2022-02-13 MED ORDER — TELMISARTAN 20 MG PO TABS: 20.0000 mg | ORAL_TABLET | Freq: Every day | ORAL | 2 refills | Status: DC

## 2022-02-13 NOTE — Addendum Note (Signed)
Addended by: Daron Offer A on: 02/13/2022 08:30 AM   Modules accepted: Orders

## 2022-02-18 ENCOUNTER — Other Ambulatory Visit: Payer: Self-pay | Admitting: Family Medicine

## 2022-02-18 DIAGNOSIS — E1165 Type 2 diabetes mellitus with hyperglycemia: Secondary | ICD-10-CM

## 2022-02-21 ENCOUNTER — Other Ambulatory Visit: Payer: Self-pay | Admitting: Student in an Organized Health Care Education/Training Program

## 2022-02-21 DIAGNOSIS — M5416 Radiculopathy, lumbar region: Secondary | ICD-10-CM

## 2022-02-21 DIAGNOSIS — G894 Chronic pain syndrome: Secondary | ICD-10-CM

## 2022-02-21 DIAGNOSIS — M47816 Spondylosis without myelopathy or radiculopathy, lumbar region: Secondary | ICD-10-CM

## 2022-02-22 ENCOUNTER — Telehealth: Payer: Self-pay

## 2022-02-22 NOTE — Progress Notes (Signed)
Chronic Care Management Pharmacy Assistant   Name: Emily Stokes  MRN: [id number] DOB: 19-Mar-1961  Reason for Encounter: Medication Review/Medication Coordination for [facility]   Recent office visits:  02/08/2022 Emily Stokes, Emily Stokes (PCP Office Visit) for Annual Physical-No medication changes noted, Lab orders placed, Patient to follow-up in 6 months  Recent consult visits:  None ID  Hospital visits:  None in previous 6 months  Medications: Outpatient Encounter Medications as of 02/22/2022  Medication Sig   acetaminophen (TYLENOL) 500 MG tablet Take 500 mg by mouth every 6 (six) hours as needed.   amLODipine (NORVASC) 5 MG tablet Take 1 tablet (5 mg total) by mouth every morning.   aspirin EC (ASPIRIN LOW DOSE) 81 MG tablet TAKE ONE TABLET BY MOUTH ONCE DAILY SWALLOW whole   atorvastatin (LIPITOR) 20 MG tablet TAKE ONE TABLET BY MOUTH ONCE DAILY   gabapentin (NEURONTIN) 300 MG capsule Take 1 capsule (300 mg total) by mouth 3 (three) times daily.   glucose blood (ONETOUCH ULTRA) test strip Use to monitor blood sugars three times daily as directed   Ibuprofen-diphenhydrAMINE Cit (IBUPROFEN PM PO) Take by mouth at bedtime as needed.   Lancet Devices (EASY MINI EJECT LANCING DEVICE) MISC    Lancets (ONETOUCH ULTRASOFT) lancets Use to monitor blood sugars three times daily as directed   metFORMIN (GLUCOPHAGE) 500 MG tablet Take 2 tablets (1,000 mg total) by mouth 2 (two) times daily with a meal.   oxybutynin (DITROPAN XL) 10 MG 24 hr tablet Take 1 tablet (10 mg total) by mouth at bedtime.   telmisartan (MICARDIS) 20 MG tablet Take 1 tablet (20 mg total) by mouth daily.   TRULICITY 3 ZM/6.2HU SOPN Inject 3 mg as directed once a week.   No facility-administered encounter medications on file as of 02/22/2022.   Care Gaps: Diabetic Eye Exam  Star Rating Drugs: Atorvastatin 20 mg last filled on 01/31/2022 for a 30-Day supply with [facility] Metformin 500 mg last filled  on 01/31/2022 for a 30-Day supply with [facility] Telmisartan 20 mg last filled on 02/13/2022 for a 23-Day supply with [facility] Trulicity 3 mg last filled on 01/31/2022 for a 28-Day supply with [facility]  BP Readings from Last 3 Encounters:  02/08/22 117/68  12/19/21 119/82  12/18/21 119/75    Lab Results  Component Value Date   HGBA1C 6.0 (H) 02/08/2022    Patient obtains medications through Adherence Packaging  30 Days   Last adherence delivery included:  Gabapentin 300 mg 1 capsule three times daily (Breakfast, Lunch, Evening Meals) Metformin 500 mg 2 tablets twice daily (Breakfast, Bedtime) Amlodipine 10 mg 1 tablet daily (Breakfast) Atorvastatin 20 mg 1 tablet daily (Breakfast) Aspirin 81 mg 1 tablet daily (Breakfast) Metoprolol Tartrate 25 mg 1 tablet twice daily (Breakfast, Bedtime) Trulicity Inject 3 mg into the skin once a week Oxybutynin 10 mg 1 tablet daily (Bedtime) OneTouch Test Strips OneTouch Lancets  Patient declined the following medications for the month of September: No medications were declined  Patient is due for next adherence delivery on: 03/06/2022 1st Route.  Called patient and reviewed medications and coordinated delivery.  This delivery to include: Gabapentin 300 mg 1 capsule three times daily (Breakfast, Lunch, Evening Meals) Metformin 500 mg 2 tablets twice daily (Breakfast, Bedtime) Amlodipine 10 mg 1 tablet daily (Breakfast) Atorvastatin 20 mg 1 tablet daily (Breakfast) Aspirin 81 mg 1 tablet daily (Breakfast) Trulicity Inject 3 mg into the skin once a week Oxybutynin 10 mg  1 tablet daily (Bedtime) Telmisartan 20 mg 1 tablet daily  (Breakfast) OneTouch Test Strips OneTouch Lancets  Patient declined the following medications for the month of October: No medications were declined  Patient needs refills for Gabapentin 300 mg this is a specialist medication and there request was sent by [facility].  Confirmed  delivery date of 03/06/2022 [address]st Route, advised patient that pharmacy will contact them the morning of delivery.  [name], CPA/CMA Clinical Pharmacist Assistant Phone: [phone]

## 2022-02-28 ENCOUNTER — Encounter: Payer: Self-pay | Admitting: Family Medicine

## 2022-03-04 ENCOUNTER — Telehealth: Payer: Self-pay

## 2022-03-04 NOTE — Telephone Encounter (Signed)
The pharmacy left a vm saying they have sent 2 requests for a refill for gabapentin and have not received anything. Is this something he calls out?

## 2022-03-04 NOTE — Telephone Encounter (Signed)
Pharmacy called and informed her last visit with [place] was 04/2021. Patient will need a visit in order to renew.

## 2022-03-11 ENCOUNTER — Encounter (INDEPENDENT_AMBULATORY_CARE_PROVIDER_SITE_OTHER): Payer: Self-pay

## 2022-03-13 ENCOUNTER — Ambulatory Visit: Payer: Self-pay | Admitting: *Deleted

## 2022-03-13 NOTE — Telephone Encounter (Signed)
Spoke with patient and advised

## 2022-03-13 NOTE — Telephone Encounter (Signed)
Summary: Medication Management   Patient thought PCP d/c amLODipine (NORVASC) 5 MG tablet and atorvastatin (LIPITOR) 20 MG tablet, patient received medication through mail order and unsure if she should take it, please advise     Per visit October 3: I spoke with the patient and provided her with all the above instructions. Patient instructed to decrease her Amlodipine to 5 mg daily, Discontinue her Metoprolol, and start Telmisartan daily. .....  Reason for Disposition  Caller has medicine question only, adult not sick, AND triager answers question  Answer Assessment - Initial Assessment Questions 1. NAME of MEDICINE: "What medicine(s) are you calling about?"     Amlodipine and Atorvastatin 2. QUESTION: "What is your question?" (e.g., double dose of medicine, side effect)     Patient can't remember if she is supposed to continue these mediactions 3. PRESCRIBER: "Who prescribed the medicine?" Reason: if prescribed by specialist, call should be referred to that group.     PCP OV reviewed and current medication list. Patient voiced understanding  Protocols used: Medication Question Call-A-AH

## 2022-03-14 ENCOUNTER — Ambulatory Visit: Payer: Medicare Other | Admitting: Urology

## 2022-03-21 ENCOUNTER — Other Ambulatory Visit: Payer: Self-pay | Admitting: Family Medicine

## 2022-03-21 ENCOUNTER — Telehealth: Payer: Self-pay

## 2022-03-21 DIAGNOSIS — E119 Type 2 diabetes mellitus without complications: Secondary | ICD-10-CM

## 2022-03-21 MED ORDER — METFORMIN HCL 500 MG PO TABS: 1000.0000 mg | ORAL_TABLET | Freq: Two times a day (BID) | ORAL | 11 refills | Status: DC

## 2022-03-21 NOTE — Addendum Note (Signed)
Addended by: Daron Offer A on: 03/21/2022 11:41 AM   Modules accepted: Orders

## 2022-03-21 NOTE — Progress Notes (Signed)
Chronic Care Management Pharmacy Assistant   Name: TAMYAH CUTBIRTH  MRN: [id number] DOB: 01/28/1961  Reason for Encounter: Medication Review/Medication Coordination for [facility]   Recent office visits:  None ID  Recent consult visits:  None ID  Hospital visits:  None in previous 6 months  Medications: Outpatient Encounter Medications as of 03/21/2022  Medication Sig   acetaminophen (TYLENOL) 500 MG tablet Take 500 mg by mouth every 6 (six) hours as needed.   amLODipine (NORVASC) 5 MG tablet Take 1 tablet (5 mg total) by mouth every morning.   aspirin EC (ASPIRIN LOW DOSE) 81 MG tablet TAKE ONE TABLET BY MOUTH ONCE DAILY SWALLOW whole   atorvastatin (LIPITOR) 20 MG tablet TAKE ONE TABLET BY MOUTH ONCE DAILY   gabapentin (NEURONTIN) 300 MG capsule Take 1 capsule (300 mg total) by mouth 3 (three) times daily.   glucose blood (ONETOUCH ULTRA) test strip Use to monitor blood sugars three times daily as directed   Ibuprofen-diphenhydrAMINE Cit (IBUPROFEN PM PO) Take by mouth at bedtime as needed.   Lancet Devices (EASY MINI EJECT LANCING DEVICE) MISC    Lancets (ONETOUCH ULTRASOFT) lancets Use to monitor blood sugars three times daily as directed   metFORMIN (GLUCOPHAGE) 500 MG tablet Take 2 tablets (1,000 mg total) by mouth 2 (two) times daily with a meal.   oxybutynin (DITROPAN XL) 10 MG 24 hr tablet Take 1 tablet (10 mg total) by mouth at bedtime.   telmisartan (MICARDIS) 20 MG tablet Take 1 tablet (20 mg total) by mouth daily.   TRULICITY 3 JQ/7.3AL SOPN Inject 3 mg as directed once a week.   No facility-administered encounter medications on file as of 03/21/2022.   Care Gaps: Diabetic Eye Exam  Star Rating Drugs: Atorvastatin 20 mg last filled on 03/01/2022 for a 30-Day supply with [facility] Metformin 500 mg last filled on 03/01/2022 for a 30-Day supply with [facility] Telmisartan 20 mg last filled on10/20/2023 for a 23-Day supply with [facility] Trulicity 3 mg last filled on 03/01/2022 for a 28-Day supply with [facility]  BP Readings from Last 3 Encounters:  02/08/22 117/68  12/19/21 119/82  12/18/21 119/75    Lab Results  Component Value Date   HGBA1C 6.0 (H) 02/08/2022    Patient obtains medications through Adherence Packaging  30 Days   Last adherence delivery included: Gabapentin 300 mg 1 capsule three times daily (Breakfast, Lunch, Evening Meals) Metformin 500 mg 2 tablets twice daily (Breakfast, Bedtime) Amlodipine 5 mg 1 tablet daily (Breakfast) Atorvastatin 20 mg 1 tablet daily (Breakfast) Aspirin 81 mg 1 tablet daily (Breakfast) Trulicity Inject 3 mg into the skin once a week Oxybutynin 10 mg 1 tablet daily (Bedtime) Telmisartan 20 mg 1 tablet daily  (Breakfast) OneTouch Test Strips OneTouch Lancets  Patient declined the following medications for the month of October: No medications were declined  Patient is due for next adherence delivery on: 04/03/2022 1st Route.  Called patient and reviewed medications and coordinated delivery.  This delivery to include: Gabapentin 300 mg 1 capsule three times daily (Breakfast, Lunch, Evening Meals) Metformin 500 mg 2 tablets twice daily (Breakfast, Bedtime) Amlodipine 5 mg 1 tablet daily (Breakfast) Atorvastatin 20 mg 1 tablet daily (Breakfast) Aspirin 81 mg 1 tablet daily (Breakfast) Trulicity Inject 3 mg into the skin once a week Oxybutynin 10 mg 1 tablet daily (Bedtime) Telmisartan 20 mg 1 tablet daily  (Breakfast) OneTouch Test Strips OneTouch Lancets  Patient declined the following medications for  the month of November: No medications were declined  Patient needs refills for the month of November: Gabapentin 300 mg (Specialist) [facility] has sent refill request for this medication. Metformin 500 mg this is a PCP medication that CPP can send refill for  Patient has a follow-up in December to speak with her Pain Management Specialist regarding  her Gabapentin.  Confirmed delivery date of 04/03/2022 [address] Route, advised patient that pharmacy will contact them the morning of delivery.  Patient has a follow-up with CPP on 03/26/2022 @ 1000.  [name], CPA/CMA Clinical Pharmacist Assistant Phone: [phone]

## 2022-03-26 ENCOUNTER — Telehealth: Payer: Medicare Other

## 2022-03-26 NOTE — Progress Notes (Incomplete)
Chronic Care Management Pharmacy Note  03/26/2022 Name:  Emily Stokes MRN:  [id number] DOB:  1960-12-17  Summary: Patient presents for CCM follow-up. Blood pressure and blood sugars significantly improved.   Recommendations/Changes made from today's visit: Continue current medications  Plan: CPP follow-up in 3 months  Subjective: Emily Stokes is an 61 y.o. year old female who is a primary patient of Gwyneth Sprout, [place].  The CCM team was consulted for assistance with disease management and care coordination needs.    Engaged with patient by telephone for follow up visit in response to provider referral for pharmacy case management and/or care coordination services.   Consent to Services:  The patient was given information about Chronic Care Management services, agreed to services, and gave verbal consent prior to initiation of services.  Please see initial visit note for detailed documentation.   Patient Care Team: Gwyneth Sprout, FNP as PCP - General (Family Medicine) [place], [place] (Optometry) Gillis Santa, MD as Consulting Physician (Pain Medicine) Neldon Labella, RN as Case Manager Germaine Pomfret, [facility] (Pharmacist)  Recent office visits: 10/16/21: Patient presented to [name], FNP for follow-up. Oxybutynin 10 mg daily.  07/17/21: Patient presented to [facility], PA-C for cutaneous abscess.  07/05/21: Patient presented to [name], FNP for follow-up.  04/19/21: Patient presented to [facility], PA-C for UTI. Macrobid.    Recent consult visits: None in past 6 months.   Hospital visits: None in past 6 months.   Objective:  Lab Results  Component Value Date   CREATININE 1.24 (H) 02/08/2022   BUN 18 02/08/2022   GFRNONAA 55 (L) 06/06/2020   GFRAA 63 06/06/2020   NA 140 02/08/2022   K 4.3 02/08/2022   CALCIUM 9.7 02/08/2022   CO2 23 02/08/2022   GLUCOSE 95 02/08/2022    Lab Results  Component Value Date/Time   HGBA1C 6.0 (H) 02/08/2022 09:08  AM   HGBA1C 6.5 (A) 10/16/2021 09:38 AM   HGBA1C 7.5 (H) 07/05/2021 09:29 AM   MICROALBUR 50 06/06/2020 10:48 AM   MICROALBUR 20 01/30/2018 04:54 PM    Last diabetic Eye exam:  Lab Results  Component Value Date/Time   HMDIABEYEEXA No Retinopathy 09/15/2019 12:00 AM    Last diabetic Foot exam: No results found for: "HMDIABFOOTEX"   Lab Results  Component Value Date   CHOL 126 02/08/2022   HDL 28 (L) 02/08/2022   LDLCALC 83 02/08/2022   TRIG 73 02/08/2022   CHOLHDL 4.5 (H) 02/08/2022       Latest Ref Rng & Units 02/08/2022    9:08 AM 07/05/2021    9:29 AM 06/06/2020   10:44 AM  Hepatic Function  Total Protein 6.0 - 8.5 g/dL 7.5  7.6  7.4   Albumin 3.9 - 4.9 g/dL 4.4  4.1  4.0   AST 0 - 40 IU/L _0 ALT 0 - 32 IU/L _1 Alk Phosphatase 44 - 121 IU/L 95  103  116   Total Bilirubin 0.0 - 1.2 mg/dL 0.4  0.4  0.4     Lab Results  Component Value Date/Time   TSH 1.900 02/08/2022 09:08 AM   TSH 1.500 07/28/2019 08:25 AM   FREET4 1.37 02/08/2022 09:08 AM       Latest Ref Rng & Units 02/08/2022    9:08 AM 07/28/2019    8:25 AM 04/20/2019    9:19 AM  CBC  WBC  3.4 - 10.8 x10E3/uL 4.8  6.0  5.3   Hemoglobin 11.1 - 15.9 g/dL 11.2  11.7  11.1   Hematocrit 34.0 - 46.6 % 34.7  35.1  35.6   Platelets 150 - 450 x10E3/uL 303  374  396     No results found for: "VD25OH"  Clinical ASCVD: No  The ASCVD Risk score (Arnett DK, et al., 2019) failed to calculate for the following reasons:   The valid total cholesterol range is 130 to 320 mg/dL       02/08/2022    8:46 AM 12/19/2021    9:47 AM 12/19/2021    9:00 AM  Depression screen PHQ 2/9  Decreased Interest 0 0 0  Down, Depressed, Hopeless 0 0 0  PHQ - 2 Score 0 0 0  Altered sleeping 1 1 0  Tired, decreased energy 0 0 1  Change in appetite 0 0 1  Feeling bad or failure about yourself  0 0 0  Trouble concentrating 0 0 0  Moving slowly or fidgety/restless 0 0 0  Suicidal thoughts 0 0 0  PHQ-9 Score _0 Difficult doing work/chores Not difficult at all Not difficult at all Not difficult at all    Social History   Tobacco Use  Smoking Status Never  Smokeless Tobacco Never   BP Readings from Last 3 Encounters:  02/08/22 117/68  12/19/21 119/82  12/18/21 119/75   Pulse Readings from Last 3 Encounters:  02/08/22 84  12/19/21 81  12/18/21 89   Wt Readings from Last 3 Encounters:  02/08/22 204 lb 14.4 oz (92.9 kg)  12/19/21 208 lb (94.3 kg)  12/18/21 207 lb (93.9 kg)   BMI Readings from Last 3 Encounters:  02/08/22 30.26 kg/m  12/19/21 30.72 kg/m  12/18/21 30.57 kg/m    Assessment/Interventions: Review of patient past medical history, allergies, medications, health status, including review of consultants reports, laboratory and other test data, was performed as part of comprehensive evaluation and provision of chronic care management services.   SDOH:  (Social Determinants of Health) assessments and interventions performed: Yes SDOH Interventions    Flowsheet Row Care Coordination from 12/14/2021 in [place] Management from 07/06/2021 in [place] Management from 06/05/2021 in [place] from 05/09/2021 in [place] Management from 05/08/2021 in [place] Management from 03/27/2021 in [place] Interventions        Food Insecurity Interventions Intervention Not Indicated -- -- Intervention Not Indicated -- --  Housing Interventions -- -- -- Intervention Not Indicated -- --  Transportation Interventions Intervention Not Indicated -- -- Intervention Not Indicated -- --  Financial Strain Interventions -- Other (Comment)  [PAP] Other (Comment)  [PAP] Intervention Not Indicated Intervention Not Indicated Other (Comment)  [PAP]  Physical Activity Interventions -- -- -- Patient Refused --  --  Stress Interventions -- -- -- Intervention Not Indicated -- --  Social Connections Interventions -- -- -- Intervention Not Indicated -- --          SDOH Screenings   Food Insecurity: No Food Insecurity (12/14/2021)  Housing: Low Risk  (05/09/2021)  Transportation Needs: No Transportation Needs (12/14/2021)  Alcohol Screen: Low Risk  (02/08/2022)  Depression (PHQ2-9): Low Risk  (02/08/2022)  Financial Resource Strain: Medium Risk (07/06/2021)  Physical Activity: Inactive (05/09/2021)  Social Connections: Moderately Isolated (05/09/2021)  Stress: No Stress Concern Present (05/09/2021)  Tobacco Use: Low Risk  (02/08/2022)    CCM Care Plan  Allergies  Allergen Reactions   Sulfamethoxazole-Trimethoprim Swelling and Hives    Medications Reviewed Today     Reviewed by Beverlee Nims, [place] (Certified Medical Assistant) on 02/08/22 at [phone]  Med List Status: <None>   Medication Order Taking? Sig Documenting Provider Last Dose Status Informant  acetaminophen (TYLENOL) 500 MG tablet [id number] Yes Take 500 mg by mouth every 6 (six) hours as needed. [provider] Taking Active   amLODipine (NORVASC) 10 MG tablet [id number] Yes TAKE ONE TABLET BY MOUTH EVERY MORNING Gwyneth Sprout, FNP Taking Active   aspirin EC (ASPIRIN LOW DOSE) 81 MG tablet [id number] Yes TAKE ONE TABLET BY MOUTH ONCE DAILY SWALLOW whole Gwyneth Sprout, FNP Taking Active   atorvastatin (LIPITOR) 20 MG tablet [id number] Yes TAKE ONE TABLET BY MOUTH ONCE DAILY Gwyneth Sprout, FNP Taking Active   Dulaglutide (TRULICITY) 3 FE/0.7HQ SOPN [id number] Yes Inject 3 mg as directed once a week. Gwyneth Sprout, FNP Taking Active   gabapentin (NEURONTIN) 300 MG capsule [id number]  Take 1 capsule (300 mg total) by mouth 3 (three) times daily. Gillis Santa, MD  Expired 02/04/22 2359   glucose blood (ONETOUCH ULTRA) test strip [id number] Yes Use to monitor blood sugars three times daily as directed [name] T, FNP Taking Active    Ibuprofen-diphenhydrAMINE Cit (IBUPROFEN PM PO) [id number] Yes Take by mouth at bedtime as needed. [provider] Taking Active   Lancet Devices (EASY MINI EJECT LANCING DEVICE) MISC [id number] Yes  [provider] Taking Active   Lancets Glory Rosebush ULTRASOFT) lancets [id number] Yes Use to monitor blood sugars three times daily as directed Gwyneth Sprout, FNP Taking Active   metFORMIN (GLUCOPHAGE) 500 MG tablet [id number] Yes Take 2 tablets (1,000 mg total) by mouth 2 (two) times daily with a meal. Birdie Sons, MD Taking Active   metoprolol tartrate (LOPRESSOR) 25 MG tablet [id number] Yes Take 1 tablet (25 mg total) by mouth 2 (two) times daily. Gwyneth Sprout, FNP Taking Active   oxybutynin (DITROPAN XL) 10 MG 24 hr tablet [id number] Yes Take 1 tablet (10 mg total) by mouth at bedtime. Gwyneth Sprout, FNP Taking Active   Med List Note Landis Martins, [place] 07/13/20 5997): UDS 10-21-19 MR 09/11/2020 Opiod contracts signed 02/12/18 07-13-20 PA request for Tramadol submitted. [facility]  dw            Patient Active Problem List   Diagnosis Date Noted   Annual physical exam 02/08/2022   Influenza vaccination administered at current visit 02/08/2022   Urinary frequency 02/08/2022   Dysuria 12/19/2021   OAB (overactive bladder) 10/16/2021   Pain of left lower extremity 10/16/2021   Need for influenza vaccination 07/05/2021   Hyperlipidemia associated with type 2 diabetes mellitus ([place]) 07/05/2021   Diabetic eye exam ([place]) 07/05/2021   Hypertension associated with diabetes ([place]) 07/05/2021   Need for shingles vaccine 07/05/2021   Ataxic gait 07/05/2021   Discoloration and thickening of nails both feet 07/05/2021   Blurred vision, bilateral 07/05/2021   Spinal stenosis, lumbar region, with neurogenic claudication 04/27/2020   Personal history of colonic polyps    Polyp of transverse colon    Lumbar facet arthropathy 03/18/2018   Lumbar spondylosis 03/18/2018    Encounter for screening colonoscopy    Internal hemorrhoids    Benign neoplasm of descending colon    Benign neoplasm of cecum    Type 2  diabetes mellitus with hyperglycemia, without long-term current use of insulin ([place]) 07/31/2017   Recurrent abdominal hernia without obstruction or gangrene 07/24/2017   Chronic pain syndrome 05/30/2016   Nasal septal abscess 05/30/2016   Lumbar degenerative disc disease 09/14/2015   Facet syndrome, lumbar 09/14/2015   Lumbar radiculopathy 09/14/2015   Sacroiliac joint dysfunction 09/14/2015   History of chicken pox 02/02/2015   Hypercholesteremia 02/02/2015   Blood glucose elevated 02/02/2015   Pain in soft tissues of limb 02/02/2015   Nonspecific ST-T changes 02/02/2015   Awareness of heartbeats 02/02/2015   Pain in extremity at multiple sites 02/02/2015   Snores 02/02/2015   Hernia of anterior abdominal wall 02/24/2012   Adnexal mass 10/30/2011   Pelvic cyst 10/30/2011   Palpitations 03/14/2011   Abnormal finding on EKG 03/14/2011   Obesity 03/14/2011   Urinary system disease 08/10/2008   Arthralgia of lower leg 03/28/2008   Anemia, iron deficiency 06/17/2007   Calculus of kidney 05/26/2007    Immunization History  Administered Date(s) Administered   Influenza Split 05/26/2007   Influenza,inj,Quad PF,6+ Mos 05/31/2016, 05/19/2017, 01/30/2018, 02/11/2020, 07/05/2021, 02/08/2022   PFIZER(Purple Top)SARS-COV-2 Vaccination 08/05/2019, 08/31/2019   Pneumococcal Polysaccharide-23 01/30/2018    Conditions to be addressed/monitored:  Hypertension, Hyperlipidemia, Diabetes, and Chronic Pain  There are no care plans that you recently modified to display for this patient.    Medication Assistance: None required.  Patient affirms current coverage meets needs.  Compliance/Adherence/Medication fill history: Care Gaps: Zoster Vaccines PNA Vaccine COVID-19 Vaccine Booster 3 Diabetic Eye Exam Influenza Vaccine  Star-Rating  Drugs: Atorvastatin 20 mg last filled on 08/31/2021 for a 30-Day supply with [facility] Metformin 500 mg  last filled on 08/31/2021 for a 30-Day supply with [facility] Trulicity 1.5 mg  last filled on 08/31/2021 for a 30-Day supply with [facility]   Patient's preferred pharmacy is:  [facility] - [place], [place] - [address] [place] [address] Phone: [phone] Fax: [phone]  [place], [place] - [address] [place] [address] Phone: [phone] Fax: [phone]  Patient decided to: Utilize [facility] for medication synchronization, packaging and delivery  Care Plan and Follow Up Patient Decision:  Patient agrees to Care Plan and Follow-up.  Plan: No further follow up required: Patient will reach out for to the clinical team for further clinical questions  Junius Argyle, PharmD, BCACP, CPP  Clinical Pharmacist Practitioner  [facility] [phone]  Current Barriers:  Unable to achieve control of diabetes   Pharmacist Clinical Goal(s):  Patient will maintain control of diabetes as evidenced by A1c less than 7%  through collaboration with PharmD and provider.   Interventions: 1:1 collaboration with Gwyneth Sprout, FNP regarding development and update of comprehensive plan of care as evidenced by provider attestation and co-signature Inter-disciplinary care team collaboration (see longitudinal plan of care) Comprehensive medication review performed; medication list updated in electronic medical record  Hypertension (BP goal <130/80) -Controlled -Current treatment: Amlodipine 5 mg daily: Appropriate, Effective, Safe, Accessible Telmisartan 20 mg daily  -Medications previously tried: NA  -Current home readings: Does not monitor at home -Denies hypotensive/hypertensive symptoms -Questionably  appropriate use of beta blocker given lack of clear secondary indication.  -Given borderline CKD, patient may benefit from ARB in place of beta blocker. Will defer for now.  -Recommended to continue current medication  Hyperlipidemia: (LDL goal < 70) -Uncontrolled, but improved.  -Current treatment:  Atorvastatin 20 mg daily  -Current treatment: Aspirin 81 mg daily  -Medications previously tried: NA  -Patient close to goal, but LDL still slightly elevated.   -Continue current medications  Diabetes (A1c goal <7%) -Controlled.  -Diagnosed 2018 -Current medications: Metformin 1000 mg 2 tablets twice daily: Appropriate, Effective, Safe, Accessible Trulicity 3 mg weekly on tuesdays: Appropriate, Effective, Safe, Accessible  -Medications previously tried: NA  -Current home glucose readings:  Fasting: 95, 120, 130. Highest in recall 140  -Reports  hypoglycemic symptoms: shakiness when blood sugar was 87.    -Current meal patterns: Significantly reduced appetite since starting Trulicity. Salads 2-3x weekly. Drinks mainly water. Snacks peanuts.  -Current exercise: Unable walk due to leg pain.  -Continue current medications.   Chronic Pain (Goal: Minimize pain symptoms) -Not ideally controlled: Not addressed during this visit. -Managed by Dr. Holley Raring -Large components of spinal stenosis, musculoskeletal pain.  -Current treatment  Gabapentin 300 mg three times daily  -Medications previously tried: NA  -Recommended scheduling follow-up with pain management.  -Recommended to continue current medication  Patient Goals/Self-Care Activities Patient will:  - check glucose daily before breakfast, document, and provide at future appointments  Follow Up Plan: No further follow up required: Patient will reach out for to the clinical team for further clinical questions

## 2022-04-12 ENCOUNTER — Telehealth: Payer: Self-pay

## 2022-04-12 ENCOUNTER — Telehealth: Payer: Medicare Other

## 2022-04-12 NOTE — Telephone Encounter (Signed)
  Care Management   Follow Up Note   04/12/2022 Name: Emily Stokes MRN: [id number] DOB: 12/22/1960   Referred by: Gwyneth Sprout, FNP Reason for referral : No chief complaint on file.   A second unsuccessful telephone outreach was attempted today. The patient was referred to the case management team for assistance with care management and care coordination.   Follow Up Plan: A HIPPA compliant phone message was left for the patient providing contact information and requesting a return call.   Junius Argyle, PharmD, [place], CPP  Clinical Pharmacist Practitioner  [facility] [phone]

## 2022-04-12 NOTE — Progress Notes (Deleted)
Chronic Care Management Pharmacy Note  04/12/2022 Name:  Emily Stokes MRN:  [id number] DOB:  Mar 16, 1961  Summary: Patient presents for CCM follow-up. Blood pressure and blood sugars significantly improved.   Recommendations/Changes made from today's visit: Continue current medications  Plan: CPP follow-up in 3 months  Subjective: Emily Stokes is an 61 y.o. year old female who is a primary patient of Gwyneth Sprout, [place].  The CCM team was consulted for assistance with disease management and care coordination needs.    Engaged with patient by telephone for follow up visit in response to provider referral for pharmacy case management and/or care coordination services.   Consent to Services:  The patient was given information about Chronic Care Management services, agreed to services, and gave verbal consent prior to initiation of services.  Please see initial visit note for detailed documentation.   Patient Care Team: Gwyneth Sprout, FNP as PCP - General (Family Medicine) [place], [place] (Optometry) Gillis Santa, MD as Consulting Physician (Pain Medicine) Neldon Labella, RN as Case Manager Germaine Pomfret, [facility] (Pharmacist)  Recent office visits: 10/16/21: Patient presented to [name], FNP for follow-up. Oxybutynin 10 mg daily.  07/17/21: Patient presented to [facility], PA-C for cutaneous abscess.  07/05/21: Patient presented to [name], FNP for follow-up.  04/19/21: Patient presented to [facility], PA-C for UTI. Macrobid.    Recent consult visits: None in past 6 months.   Hospital visits: None in past 6 months.   Objective:  Lab Results  Component Value Date   CREATININE 1.24 (H) 02/08/2022   BUN 18 02/08/2022   GFRNONAA 55 (L) 06/06/2020   GFRAA 63 06/06/2020   NA 140 02/08/2022   K 4.3 02/08/2022   CALCIUM 9.7 02/08/2022   CO2 23 02/08/2022   GLUCOSE 95 02/08/2022    Lab Results  Component Value Date/Time   HGBA1C 6.0 (H) 02/08/2022 09:08  AM   HGBA1C 6.5 (A) 10/16/2021 09:38 AM   HGBA1C 7.5 (H) 07/05/2021 09:29 AM   MICROALBUR 50 06/06/2020 10:48 AM   MICROALBUR 20 01/30/2018 04:54 PM    Last diabetic Eye exam:  Lab Results  Component Value Date/Time   HMDIABEYEEXA No Retinopathy 09/15/2019 12:00 AM    Last diabetic Foot exam: No results found for: "HMDIABFOOTEX"   Lab Results  Component Value Date   CHOL 126 02/08/2022   HDL 28 (L) 02/08/2022   LDLCALC 83 02/08/2022   TRIG 73 02/08/2022   CHOLHDL 4.5 (H) 02/08/2022       Latest Ref Rng & Units 02/08/2022    9:08 AM 07/05/2021    9:29 AM 06/06/2020   10:44 AM  Hepatic Function  Total Protein 6.0 - 8.5 g/dL 7.5  7.6  7.4   Albumin 3.9 - 4.9 g/dL 4.4  4.1  4.0   AST 0 - 40 IU/L _0 ALT 0 - 32 IU/L _1 Alk Phosphatase 44 - 121 IU/L 95  103  116   Total Bilirubin 0.0 - 1.2 mg/dL 0.4  0.4  0.4     Lab Results  Component Value Date/Time   TSH 1.900 02/08/2022 09:08 AM   TSH 1.500 07/28/2019 08:25 AM   FREET4 1.37 02/08/2022 09:08 AM       Latest Ref Rng & Units 02/08/2022    9:08 AM 07/28/2019    8:25 AM 04/20/2019    9:19 AM  CBC  WBC  3.4 - 10.8 x10E3/uL 4.8  6.0  5.3   Hemoglobin 11.1 - 15.9 g/dL 11.2  11.7  11.1   Hematocrit 34.0 - 46.6 % 34.7  35.1  35.6   Platelets 150 - 450 x10E3/uL 303  374  396     No results found for: "VD25OH"  Clinical ASCVD: No  The ASCVD Risk score (Arnett DK, et al., 2019) failed to calculate for the following reasons:   The valid total cholesterol range is 130 to 320 mg/dL       02/08/2022    8:46 AM 12/19/2021    9:47 AM 12/19/2021    9:00 AM  Depression screen PHQ 2/9  Decreased Interest 0 0 0  Down, Depressed, Hopeless 0 0 0  PHQ - 2 Score 0 0 0  Altered sleeping 1 1 0  Tired, decreased energy 0 0 1  Change in appetite 0 0 1  Feeling bad or failure about yourself  0 0 0  Trouble concentrating 0 0 0  Moving slowly or fidgety/restless 0 0 0  Suicidal thoughts 0 0 0  PHQ-9 Score _0 Difficult doing work/chores Not difficult at all Not difficult at all Not difficult at all    Social History   Tobacco Use  Smoking Status Never  Smokeless Tobacco Never   BP Readings from Last 3 Encounters:  02/08/22 117/68  12/19/21 119/82  12/18/21 119/75   Pulse Readings from Last 3 Encounters:  02/08/22 84  12/19/21 81  12/18/21 89   Wt Readings from Last 3 Encounters:  02/08/22 204 lb 14.4 oz (92.9 kg)  12/19/21 208 lb (94.3 kg)  12/18/21 207 lb (93.9 kg)   BMI Readings from Last 3 Encounters:  02/08/22 30.26 kg/m  12/19/21 30.72 kg/m  12/18/21 30.57 kg/m    Assessment/Interventions: Review of patient past medical history, allergies, medications, health status, including review of consultants reports, laboratory and other test data, was performed as part of comprehensive evaluation and provision of chronic care management services.   SDOH:  (Social Determinants of Health) assessments and interventions performed: Yes SDOH Interventions    Flowsheet Row Care Coordination from 12/14/2021 in [place] Management from 07/06/2021 in [place] Management from 06/05/2021 in [place] from 05/09/2021 in [place] Management from 05/08/2021 in [place] Management from 03/27/2021 in [place] Interventions        Food Insecurity Interventions Intervention Not Indicated -- -- Intervention Not Indicated -- --  Housing Interventions -- -- -- Intervention Not Indicated -- --  Transportation Interventions Intervention Not Indicated -- -- Intervention Not Indicated -- --  Financial Strain Interventions -- Other (Comment)  [PAP] Other (Comment)  [PAP] Intervention Not Indicated Intervention Not Indicated Other (Comment)  [PAP]  Physical Activity Interventions -- -- -- Patient Refused --  --  Stress Interventions -- -- -- Intervention Not Indicated -- --  Social Connections Interventions -- -- -- Intervention Not Indicated -- --          SDOH Screenings   Food Insecurity: No Food Insecurity (12/14/2021)  Housing: Low Risk  (05/09/2021)  Transportation Needs: No Transportation Needs (12/14/2021)  Alcohol Screen: Low Risk  (02/08/2022)  Depression (PHQ2-9): Low Risk  (02/08/2022)  Financial Resource Strain: Medium Risk (07/06/2021)  Physical Activity: Inactive (05/09/2021)  Social Connections: Moderately Isolated (05/09/2021)  Stress: No Stress Concern Present (05/09/2021)  Tobacco Use: Low Risk  (02/08/2022)    CCM Care Plan  Allergies  Allergen Reactions   Sulfamethoxazole-Trimethoprim Swelling and Hives    Medications Reviewed Today     Reviewed by Beverlee Nims, [place] (Certified Medical Assistant) on 02/08/22 at [phone]  Med List Status: <None>   Medication Order Taking? Sig Documenting Provider Last Dose Status Informant  acetaminophen (TYLENOL) 500 MG tablet [id number] Yes Take 500 mg by mouth every 6 (six) hours as needed. [provider] Taking Active   amLODipine (NORVASC) 10 MG tablet [id number] Yes TAKE ONE TABLET BY MOUTH EVERY MORNING Gwyneth Sprout, FNP Taking Active   aspirin EC (ASPIRIN LOW DOSE) 81 MG tablet [id number] Yes TAKE ONE TABLET BY MOUTH ONCE DAILY SWALLOW whole Gwyneth Sprout, FNP Taking Active   atorvastatin (LIPITOR) 20 MG tablet [id number] Yes TAKE ONE TABLET BY MOUTH ONCE DAILY Gwyneth Sprout, FNP Taking Active   Dulaglutide (TRULICITY) 3 FE/0.7HQ SOPN [id number] Yes Inject 3 mg as directed once a week. Gwyneth Sprout, FNP Taking Active   gabapentin (NEURONTIN) 300 MG capsule [id number]  Take 1 capsule (300 mg total) by mouth 3 (three) times daily. Gillis Santa, MD  Expired 02/04/22 2359   glucose blood (ONETOUCH ULTRA) test strip [id number] Yes Use to monitor blood sugars three times daily as directed [name] T, FNP Taking Active    Ibuprofen-diphenhydrAMINE Cit (IBUPROFEN PM PO) [id number] Yes Take by mouth at bedtime as needed. [provider] Taking Active   Lancet Devices (EASY MINI EJECT LANCING DEVICE) MISC [id number] Yes  [provider] Taking Active   Lancets Glory Rosebush ULTRASOFT) lancets [id number] Yes Use to monitor blood sugars three times daily as directed Gwyneth Sprout, FNP Taking Active   metFORMIN (GLUCOPHAGE) 500 MG tablet [id number] Yes Take 2 tablets (1,000 mg total) by mouth 2 (two) times daily with a meal. Birdie Sons, MD Taking Active   metoprolol tartrate (LOPRESSOR) 25 MG tablet [id number] Yes Take 1 tablet (25 mg total) by mouth 2 (two) times daily. Gwyneth Sprout, FNP Taking Active   oxybutynin (DITROPAN XL) 10 MG 24 hr tablet [id number] Yes Take 1 tablet (10 mg total) by mouth at bedtime. Gwyneth Sprout, FNP Taking Active   Med List Note Landis Martins, [place] 07/13/20 5997): UDS 10-21-19 MR 09/11/2020 Opiod contracts signed 02/12/18 07-13-20 PA request for Tramadol submitted. [facility]  dw            Patient Active Problem List   Diagnosis Date Noted   Annual physical exam 02/08/2022   Influenza vaccination administered at current visit 02/08/2022   Urinary frequency 02/08/2022   Dysuria 12/19/2021   OAB (overactive bladder) 10/16/2021   Pain of left lower extremity 10/16/2021   Need for influenza vaccination 07/05/2021   Hyperlipidemia associated with type 2 diabetes mellitus ([place]) 07/05/2021   Diabetic eye exam ([place]) 07/05/2021   Hypertension associated with diabetes ([place]) 07/05/2021   Need for shingles vaccine 07/05/2021   Ataxic gait 07/05/2021   Discoloration and thickening of nails both feet 07/05/2021   Blurred vision, bilateral 07/05/2021   Spinal stenosis, lumbar region, with neurogenic claudication 04/27/2020   Personal history of colonic polyps    Polyp of transverse colon    Lumbar facet arthropathy 03/18/2018   Lumbar spondylosis 03/18/2018    Encounter for screening colonoscopy    Internal hemorrhoids    Benign neoplasm of descending colon    Benign neoplasm of cecum    Type 2  diabetes mellitus with hyperglycemia, without long-term current use of insulin ([place]) 07/31/2017   Recurrent abdominal hernia without obstruction or gangrene 07/24/2017   Chronic pain syndrome 05/30/2016   Nasal septal abscess 05/30/2016   Lumbar degenerative disc disease 09/14/2015   Facet syndrome, lumbar 09/14/2015   Lumbar radiculopathy 09/14/2015   Sacroiliac joint dysfunction 09/14/2015   History of chicken pox 02/02/2015   Hypercholesteremia 02/02/2015   Blood glucose elevated 02/02/2015   Pain in soft tissues of limb 02/02/2015   Nonspecific ST-T changes 02/02/2015   Awareness of heartbeats 02/02/2015   Pain in extremity at multiple sites 02/02/2015   Snores 02/02/2015   Hernia of anterior abdominal wall 02/24/2012   Adnexal mass 10/30/2011   Pelvic cyst 10/30/2011   Palpitations 03/14/2011   Abnormal finding on EKG 03/14/2011   Obesity 03/14/2011   Urinary system disease 08/10/2008   Arthralgia of lower leg 03/28/2008   Anemia, iron deficiency 06/17/2007   Calculus of kidney 05/26/2007    Immunization History  Administered Date(s) Administered   Influenza Split 05/26/2007   Influenza,inj,Quad PF,6+ Mos 05/31/2016, 05/19/2017, 01/30/2018, 02/11/2020, 07/05/2021, 02/08/2022   PFIZER(Purple Top)SARS-COV-2 Vaccination 08/05/2019, 08/31/2019   Pneumococcal Polysaccharide-23 01/30/2018    Conditions to be addressed/monitored:  Hypertension, Hyperlipidemia, Diabetes, and Chronic Pain  There are no care plans that you recently modified to display for this patient.    Medication Assistance: None required.  Patient affirms current coverage meets needs.  Compliance/Adherence/Medication fill history: Care Gaps: Zoster Vaccines PNA Vaccine COVID-19 Vaccine Booster 3 Diabetic Eye Exam Influenza Vaccine  Star-Rating  Drugs: Atorvastatin 20 mg last filled on 08/31/2021 for a 30-Day supply with [facility] Metformin 500 mg  last filled on 08/31/2021 for a 30-Day supply with [facility] Trulicity 1.5 mg  last filled on 08/31/2021 for a 30-Day supply with [facility]   Patient's preferred pharmacy is:  [facility] - [place], [place] - [address] [place] [address] Phone: [phone] Fax: [phone]  [place], [place] - [address] [place] [address] Phone: [phone] Fax: [phone]  Patient decided to: Utilize [facility] for medication synchronization, packaging and delivery  Care Plan and Follow Up Patient Decision:  Patient agrees to Care Plan and Follow-up.  Plan: No further follow up required: Patient will reach out for to the clinical team for further clinical questions  Junius Argyle, PharmD, BCACP, CPP  Clinical Pharmacist Practitioner  [facility] [phone]  Current Barriers:  Unable to achieve control of diabetes   Pharmacist Clinical Goal(s):  Patient will maintain control of diabetes as evidenced by A1c less than 7%  through collaboration with PharmD and provider.   Interventions: 1:1 collaboration with Gwyneth Sprout, FNP regarding development and update of comprehensive plan of care as evidenced by provider attestation and co-signature Inter-disciplinary care team collaboration (see longitudinal plan of care) Comprehensive medication review performed; medication list updated in electronic medical record  Hypertension (BP goal <130/80) -Controlled -Current treatment: Amlodipine 5 mg daily: Appropriate, Effective, Safe, Accessible Telmisartan 20 mg daily  -Medications previously tried: NA  -Current home readings: Does not monitor at home -Denies hypotensive/hypertensive symptoms -Recommended to  continue current medication  Hyperlipidemia: (LDL goal < 70) -Uncontrolled, but improved.  -Current treatment: Atorvastatin 20 mg daily  -Current treatment: Aspirin 81 mg daily  -Medications previously tried: NA  -Patient close to goal, but LDL still slightly elevated.   -Continue current medications  Diabetes (A1c goal <7%) -Controlled.  -Diagnosed 2018 -Current medications: Metformin 1000 mg 2 tablets twice daily: Appropriate, Effective, Safe, Accessible Trulicity 3 mg weekly on tuesdays: Appropriate, Effective, Safe, Accessible  -Medications previously tried: NA  -Current home glucose readings:  Fasting: 95, 120, 130. Highest in recall 140  -Reports  hypoglycemic symptoms: shakiness when blood sugar was 87.    -Current meal patterns: Significantly reduced appetite since starting Trulicity. Salads 2-3x weekly. Drinks mainly water. Snacks peanuts.  -Current exercise: Unable walk due to leg pain.  -Continue current medications.   Chronic Pain (Goal: Minimize pain symptoms) -Not ideally controlled: Not addressed during this visit. -Managed by Dr. Holley Raring -Large components of spinal stenosis, musculoskeletal pain.  -Current treatment  Gabapentin 300 mg three times daily  -Medications previously tried: NA  -Recommended scheduling follow-up with pain management.  -Recommended to continue current medication  Patient Goals/Self-Care Activities Patient will:  - check glucose daily before breakfast, document, and provide at future appointments  Follow Up Plan: ***

## 2022-04-18 ENCOUNTER — Other Ambulatory Visit: Payer: Self-pay | Admitting: Family Medicine

## 2022-04-18 DIAGNOSIS — I152 Hypertension secondary to endocrine disorders: Secondary | ICD-10-CM

## 2022-04-18 NOTE — Telephone Encounter (Signed)
Future visit in 3 months .  Requested Prescriptions  Pending Prescriptions Disp Refills   telmisartan (MICARDIS) 20 MG tablet [Pharmacy Med Name: telmisartan 20 mg tablet] 30 tablet 2    Sig: TAKE ONE TABLET BY MOUTH ONCE DAILY     Cardiovascular:  Angiotensin Receptor Blockers Failed - 04/18/2022  8:04 AM      Failed - Cr in normal range and within 180 days    Creatinine  Date Value Ref Range Status  03/25/2014 1.16 0.60 - 1.30 mg/dL Final   Creatinine, Ser  Date Value Ref Range Status  02/08/2022 1.24 (H) 0.57 - 1.00 mg/dL Final         Passed - K in normal range and within 180 days    Potassium  Date Value Ref Range Status  02/08/2022 4.3 3.5 - 5.2 mmol/L Final  03/25/2014 3.8 3.5 - 5.1 mmol/L Final         Passed - Patient is not pregnant      Passed - Last BP in normal range    BP Readings from Last 1 Encounters:  02/08/22 117/68         Passed - Valid encounter within last 6 months    Recent Outpatient Visits           2 months ago Annual physical exam   [facility] Gwyneth Sprout, FNP   3 months ago No-show for appointment   [facility] Gwyneth Sprout, FNP   4 months ago [place] [name] T, FNP   6 months ago Type 2 diabetes mellitus with hyperglycemia, without long-term current use of insulin [facility])   [facility] [name] T, FNP   9 months ago Cutaneous abscess of left upper extremity   [place], [name], PA-C       Future Appointments             In 1 week Bjorn Loser, [place]   In 3 months Gwyneth Sprout, FNP [facility]             amLODipine ([place]) 5 MG tablet [place] Med Name: amlodipine 5 mg tablet] 30 tablet 2    Sig: TAKE ONE TABLET BY MOUTH EVERY MORNING     Cardiovascular: Calcium Channel Blockers 2 Passed - 04/18/2022  8:04 AM      Passed - Last BP in normal range    BP  Readings from Last 1 Encounters:  02/08/22 117/68         Passed - Last Heart Rate in normal range    Pulse Readings from Last 1 Encounters:  02/08/22 84         Passed - Valid encounter within last 6 months    Recent Outpatient Visits           2 months ago Annual physical exam   [facility] Gwyneth Sprout, FNP   3 months ago No-show for appointment   [facility] Gwyneth Sprout, FNP   4 months ago [place] [name] T, FNP   6 months ago Type 2 diabetes mellitus with hyperglycemia, without long-term current use of insulin [facility])   [facility] [name] T, FNP   9 months ago Cutaneous abscess of left upper extremity   [place], [name], PA-C       Future Appointments  In 1 week MacDiarmid, Nicki Reaper, MD [facility]   In 3 months Gwyneth Sprout, [place], [place]

## 2022-04-23 ENCOUNTER — Telehealth: Payer: Self-pay

## 2022-04-23 NOTE — Progress Notes (Signed)
Chronic Care Management Pharmacy Assistant   Name: Emily Stokes  MRN: [id number] DOB: [birth year]  Reason for Encounter: Medication Review/Medication Coordination for [facility]   Recent office visits:  None ID  Recent consult visits:  None ID  Hospital visits:  None in previous 6 months  Medications: Outpatient Encounter Medications as of 04/23/2022  Medication Sig   acetaminophen (TYLENOL) 500 MG tablet Take 500 mg by mouth every 6 (six) hours as needed.   amLODipine (NORVASC) 5 MG tablet TAKE ONE TABLET BY MOUTH EVERY MORNING   aspirin EC (ASPIRIN LOW DOSE) 81 MG tablet TAKE ONE TABLET BY MOUTH ONCE DAILY SWALLOW whole   atorvastatin (LIPITOR) 20 MG tablet TAKE ONE TABLET BY MOUTH ONCE DAILY   gabapentin (NEURONTIN) 300 MG capsule Take 1 capsule (300 mg total) by mouth 3 (three) times daily.   glucose blood (ONETOUCH ULTRA) test strip Use to monitor blood sugars three times daily as directed   Ibuprofen-diphenhydrAMINE Cit (IBUPROFEN PM PO) Take by mouth at bedtime as needed.   Lancet Devices (EASY MINI EJECT LANCING DEVICE) MISC    Lancets (ONETOUCH ULTRASOFT) lancets Use to monitor blood sugars three times daily as directed   metFORMIN (GLUCOPHAGE) 500 MG tablet Take 2 tablets (1,000 mg total) by mouth 2 (two) times daily with a meal.   oxybutynin (DITROPAN XL) 10 MG 24 hr tablet Take 1 tablet (10 mg total) by mouth at bedtime.   telmisartan (MICARDIS) 20 MG tablet TAKE ONE TABLET BY MOUTH ONCE DAILY   TRULICITY 3 AC/1.6SA SOPN Inject 3 mg as directed once a week.   No facility-administered encounter medications on file as of 04/23/2022.   Care Gaps: Dtap/Tdap Medicare Annual Wellness Diabetic Eye Exam  Star Rating Drugs: Atorvastatin 20 mg last filled on 03/29/2022 for a 30-Day supply with [facility] Metformin 500 mg last filled on 03/29/2022 for a 30-Day supply with [facility] Telmisartan 20 mg last filled on 03/29/2022 for a 23-Day supply  with [facility] Trulicity 3 mg last filled on 03/29/2022 for a 28-Day supply with [facility]  BP Readings from Last 3 Encounters:  02/08/22 117/68  12/19/21 119/82  12/18/21 119/75    Lab Results  Component Value Date   HGBA1C 6.0 (H) 02/08/2022    Patient obtains medications through Adherence Packaging  30 Days   Last adherence delivery included:  Gabapentin 300 mg 1 capsule three times daily (Breakfast, Lunch, Evening Meals) Metformin 500 mg 2 tablets twice daily (Breakfast, Bedtime) Amlodipine 5 mg 1 tablet daily (Breakfast) Atorvastatin 20 mg 1 tablet daily (Breakfast) Aspirin 81 mg 1 tablet daily (Breakfast) Trulicity Inject 3 mg into the skin once a week Oxybutynin 10 mg 1 tablet daily (Bedtime) Telmisartan 20 mg 1 tablet daily  (Breakfast) OneTouch Test Strips OneTouch Lancets  Patient declined the following medications for the month of November: No medications were declined  Patient is due for next adherence delivery on: 05/03/2022 2nd Route.  Called patient and reviewed medications and coordinated delivery.  This delivery to include: Metformin 500 mg 2 tablets twice daily (Breakfast, Bedtime) Amlodipine 5 mg 1 tablet daily (Breakfast) Atorvastatin 20 mg 1 tablet daily (Breakfast) Aspirin 81 mg 1 tablet daily (Breakfast) Trulicity Inject 3 mg into the skin once a week Oxybutynin 10 mg 1 tablet daily (Bedtime) Telmisartan 20 mg 1 tablet daily  (Breakfast) OneTouch Test Strips OneTouch Lancets  Patient declined the following medications for the month of December: Gabapentin 300 mg was declined as patient  need refill from specialist she has appointment with that specialist this month  Patient needs refills for Metformin 500 mg (PCP medication).  CPP can send refill in.  Patient has agreed to follow-up with CPP on 12/15 @ 0900  Confirmed delivery date of 05/03/2022 2nd Route, advised patient that pharmacy will contact them the morning of  delivery.  [name], CPA/CMA Clinical Pharmacist Assistant Phone: [phone]

## 2022-04-26 ENCOUNTER — Ambulatory Visit (INDEPENDENT_AMBULATORY_CARE_PROVIDER_SITE_OTHER): Payer: Medicare Other

## 2022-04-26 DIAGNOSIS — I152 Hypertension secondary to endocrine disorders: Secondary | ICD-10-CM

## 2022-04-26 DIAGNOSIS — E1165 Type 2 diabetes mellitus with hyperglycemia: Secondary | ICD-10-CM

## 2022-04-26 NOTE — Progress Notes (Signed)
Chronic Care Management Pharmacy Note  05/07/2022 Name:  Emily Stokes MRN:  [id number] DOB:  1960-07-10  Summary: Patient presents for CCM follow-up. Blood pressure and blood sugars significantly improved.   Recommendations/Changes made from today's visit: Continue current medications  Plan: CPP follow-up in 3 months  Subjective: Emily Stokes is an 61 y.o. year old female who is a primary patient of Gwyneth Sprout, [place].  The CCM team was consulted for assistance with disease management and care coordination needs.    Engaged with patient by telephone for follow up visit in response to provider referral for pharmacy case management and/or care coordination services.   Consent to Services:  The patient was given information about Chronic Care Management services, agreed to services, and gave verbal consent prior to initiation of services.  Please see initial visit note for detailed documentation.   Patient Care Team: Gwyneth Sprout, FNP as PCP - General (Family Medicine) Pa, [place] (Optometry) Gillis Santa, MD as Consulting Physician (Pain Medicine) Neldon Labella, RN as Case Manager Germaine Pomfret, [facility] (Pharmacist)  Recent office visits: 10/16/21: Patient presented to [name], FNP for follow-up. Oxybutynin 10 mg daily.  07/17/21: Patient presented to [facility], PA-C for cutaneous abscess.  07/05/21: Patient presented to [name], FNP for follow-up.  04/19/21: Patient presented to [facility], PA-C for UTI. Macrobid.    Recent consult visits: None in past 6 months.   Hospital visits: None in past 6 months.   Objective:  Lab Results  Component Value Date   CREATININE 1.24 (H) 02/08/2022   BUN 18 02/08/2022   GFRNONAA 55 (L) 06/06/2020   GFRAA 63 06/06/2020   NA 140 02/08/2022   K 4.3 02/08/2022   CALCIUM 9.7 02/08/2022   CO2 23 02/08/2022   GLUCOSE 95 02/08/2022    Lab Results  Component Value Date/Time   HGBA1C 6.0 (H) 02/08/2022 09:08  AM   HGBA1C 6.5 (A) 10/16/2021 09:38 AM   HGBA1C 7.5 (H) 07/05/2021 09:29 AM   MICROALBUR 50 06/06/2020 10:48 AM   MICROALBUR 20 01/30/2018 04:54 PM    Last diabetic Eye exam:  Lab Results  Component Value Date/Time   HMDIABEYEEXA No Retinopathy 09/15/2019 12:00 AM    Last diabetic Foot exam: No results found for: "HMDIABFOOTEX"   Lab Results  Component Value Date   CHOL 126 02/08/2022   HDL 28 (L) 02/08/2022   LDLCALC 83 02/08/2022   TRIG 73 02/08/2022   CHOLHDL 4.5 (H) 02/08/2022       Latest Ref Rng & Units 02/08/2022    9:08 AM 07/05/2021    9:29 AM 06/06/2020   10:44 AM  Hepatic Function  Total Protein 6.0 - 8.5 g/dL 7.5  7.6  7.4   Albumin 3.9 - 4.9 g/dL 4.4  4.1  4.0   AST 0 - 40 IU/L _0 ALT 0 - 32 IU/L _1 Alk Phosphatase 44 - 121 IU/L 95  103  116   Total Bilirubin 0.0 - 1.2 mg/dL 0.4  0.4  0.4     Lab Results  Component Value Date/Time   TSH 1.900 02/08/2022 09:08 AM   TSH 1.500 07/28/2019 08:25 AM   FREET4 1.37 02/08/2022 09:08 AM       Latest Ref Rng & Units 02/08/2022    9:08 AM 07/28/2019    8:25 AM 04/20/2019    9:19 AM  CBC  WBC  3.4 - 10.8 x10E3/uL 4.8  6.0  5.3   Hemoglobin 11.1 - 15.9 g/dL 11.2  11.7  11.1   Hematocrit 34.0 - 46.6 % 34.7  35.1  35.6   Platelets 150 - 450 x10E3/uL 303  374  396     No results found for: "VD25OH"  Clinical ASCVD: No  The ASCVD Risk score (Arnett DK, et al., 2019) failed to calculate for the following reasons:   The valid total cholesterol range is 130 to 320 mg/dL       02/08/2022    8:46 AM 12/19/2021    9:47 AM 12/19/2021    9:00 AM  Depression screen PHQ 2/9  Decreased Interest 0 0 0  Down, Depressed, Hopeless 0 0 0  PHQ - 2 Score 0 0 0  Altered sleeping 1 1 0  Tired, decreased energy 0 0 1  Change in appetite 0 0 1  Feeling bad or failure about yourself  0 0 0  Trouble concentrating 0 0 0  Moving slowly or fidgety/restless 0 0 0  Suicidal thoughts 0 0 0  PHQ-9 Score _0 Difficult doing work/chores Not difficult at all Not difficult at all Not difficult at all    Social History   Tobacco Use  Smoking Status Never  Smokeless Tobacco Never   BP Readings from Last 3 Encounters:  04/29/22 133/80  02/08/22 117/68  12/19/21 119/82   Pulse Readings from Last 3 Encounters:  04/29/22 (!) 102  02/08/22 84  12/19/21 81   Wt Readings from Last 3 Encounters:  04/29/22 199 lb (90.3 kg)  02/08/22 204 lb 14.4 oz (92.9 kg)  12/19/21 208 lb (94.3 kg)   BMI Readings from Last 3 Encounters:  04/29/22 29.39 kg/m  02/08/22 30.26 kg/m  12/19/21 30.72 kg/m    Assessment/Interventions: Review of patient past medical history, allergies, medications, health status, including review of consultants reports, laboratory and other test data, was performed as part of comprehensive evaluation and provision of chronic care management services.   SDOH:  (Social Determinants of Health) assessments and interventions performed: Yes SDOH Interventions    Flowsheet Row Care Coordination from 12/14/2021 in [place] Management from 07/06/2021 in [place] Management from 06/05/2021 in [place] from 05/09/2021 in [place] Management from 05/08/2021 in [place] Management from 03/27/2021 in [place] Interventions        Food Insecurity Interventions Intervention Not Indicated -- -- Intervention Not Indicated -- --  Housing Interventions -- -- -- Intervention Not Indicated -- --  Transportation Interventions Intervention Not Indicated -- -- Intervention Not Indicated -- --  Financial Strain Interventions -- Other (Comment)  [PAP] Other (Comment)  [PAP] Intervention Not Indicated Intervention Not Indicated Other (Comment)  [PAP]  Physical Activity Interventions -- -- -- Patient  Refused -- --  Stress Interventions -- -- -- Intervention Not Indicated -- --  Social Connections Interventions -- -- -- Intervention Not Indicated -- --          SDOH Screenings   Food Insecurity: No Food Insecurity (12/14/2021)  Housing: Low Risk  (05/09/2021)  Transportation Needs: No Transportation Needs (12/14/2021)  Alcohol Screen: Low Risk  (02/08/2022)  Depression (PHQ2-9): Low Risk  (02/08/2022)  Financial Resource Strain: Medium Risk (07/06/2021)  Physical Activity: Inactive (05/09/2021)  Social Connections: Moderately Isolated (05/09/2021)  Stress: No Stress Concern Present (05/09/2021)  Tobacco Use: Low Risk  (02/08/2022)    CCM Care Plan  Allergies  Allergen Reactions   Sulfamethoxazole-Trimethoprim Swelling and Hives    Medications Reviewed Today     Reviewed by Kris Mouton, CMA (Certified Medical Assistant) on 04/29/22 at 1023  Med List Status: <None>   Medication Order Taking? Sig Documenting Provider Last Dose Status Informant  acetaminophen (TYLENOL) 500 MG tablet [id number] Yes Take 500 mg by mouth every 6 (six) hours as needed. [provider] Taking Active   amLODipine (NORVASC) 5 MG tablet [id number] Yes TAKE ONE TABLET BY MOUTH EVERY MORNING Gwyneth Sprout, FNP Taking Active   aspirin EC (ASPIRIN LOW DOSE) 81 MG tablet [id number] Yes TAKE ONE TABLET BY MOUTH ONCE DAILY SWALLOW whole Gwyneth Sprout, FNP Taking Active   atorvastatin (LIPITOR) 20 MG tablet [id number] Yes TAKE ONE TABLET BY MOUTH ONCE DAILY Gwyneth Sprout, FNP Taking Active   gabapentin (NEURONTIN) 300 MG capsule [id number] Yes Take 1 capsule (300 mg total) by mouth 3 (three) times daily. Gillis Santa, MD Taking Active   glucose blood (ONETOUCH ULTRA) test strip [id number] Yes Use to monitor blood sugars three times daily as directed [name] T, FNP Taking Active   Ibuprofen-diphenhydrAMINE Cit (IBUPROFEN PM PO) [id number] Yes Take by mouth at bedtime as needed. [provider] Taking Active   Lancet Devices (EASY MINI EJECT LANCING DEVICE) MISC [id number] Yes  [provider] Taking Active   Lancets Glory Rosebush ULTRASOFT) lancets [id number] Yes Use to monitor blood sugars three times daily as directed Gwyneth Sprout, FNP Taking Active   metFORMIN (GLUCOPHAGE) 500 MG tablet [id number] Yes Take 2 tablets (1,000 mg total) by mouth 2 (two) times daily with a meal. Gwyneth Sprout, FNP Taking Active   oxybutynin (DITROPAN XL) 10 MG 24 hr tablet [id number] Yes Take 1 tablet (10 mg total) by mouth at bedtime. Gwyneth Sprout, FNP Taking Active   telmisartan (MICARDIS) 20 MG tablet [id number] Yes TAKE ONE TABLET BY MOUTH ONCE DAILY Gwyneth Sprout, FNP Taking Active   TRULICITY 3 YK/9.9IP SOPN [id number] Yes Inject 3 mg as directed once a week. Gwyneth Sprout, FNP Taking Active   Med List Note Landis Martins, [place] 07/13/20 6734): UDS 10-21-19 MR 09/11/2020 Opiod contracts signed 02/12/18 07-13-20 PA request for Tramadol submitted. [facility]  dw            Patient Active Problem List   Diagnosis Date Noted   Annual physical exam 02/08/2022   Influenza vaccination administered at current visit 02/08/2022   Urinary frequency 02/08/2022   Dysuria 12/19/2021   OAB (overactive bladder) 10/16/2021   Pain of left lower extremity 10/16/2021   Need for influenza vaccination 07/05/2021   Hyperlipidemia associated with type 2 diabetes mellitus ([place]) 07/05/2021   Diabetic eye exam ([place]) 07/05/2021   Hypertension associated with diabetes ([place]) 07/05/2021   Need for shingles vaccine 07/05/2021   Ataxic gait 07/05/2021   Discoloration and thickening of nails both feet 07/05/2021   Blurred vision, bilateral 07/05/2021   Spinal stenosis, lumbar region, with neurogenic claudication 04/27/2020   Personal history of colonic polyps    Polyp of transverse colon    Lumbar facet arthropathy 03/18/2018   Lumbar spondylosis 03/18/2018   Encounter for screening colonoscopy     Internal hemorrhoids    Benign neoplasm of descending colon    Benign neoplasm of cecum    Type 2 diabetes mellitus with hyperglycemia, without long-term current use  of insulin ([place]) 07/31/2017   Recurrent abdominal hernia without obstruction or gangrene 07/24/2017   Chronic pain syndrome 05/30/2016   Nasal septal abscess 05/30/2016   Lumbar degenerative disc disease 09/14/2015   Facet syndrome, lumbar 09/14/2015   Lumbar radiculopathy 09/14/2015   Sacroiliac joint dysfunction 09/14/2015   History of chicken pox 02/02/2015   Hypercholesteremia 02/02/2015   Blood glucose elevated 02/02/2015   Pain in soft tissues of limb 02/02/2015   Nonspecific ST-T changes 02/02/2015   Awareness of heartbeats 02/02/2015   Pain in extremity at multiple sites 02/02/2015   Snores 02/02/2015   Hernia of anterior abdominal wall 02/24/2012   Adnexal mass 10/30/2011   Pelvic cyst 10/30/2011   Palpitations 03/14/2011   Abnormal finding on EKG 03/14/2011   Obesity 03/14/2011   Urinary system disease 08/10/2008   Arthralgia of lower leg 03/28/2008   Anemia, iron deficiency 06/17/2007   Calculus of kidney 05/26/2007    Immunization History  Administered Date(s) Administered   Influenza Split 05/26/2007   Influenza,inj,Quad PF,6+ Mos 05/31/2016, 05/19/2017, 01/30/2018, 02/11/2020, 07/05/2021, 02/08/2022   PFIZER(Purple Top)SARS-COV-2 Vaccination 08/05/2019, 08/31/2019   Pneumococcal Polysaccharide-23 01/30/2018    Conditions to be addressed/monitored:  Hypertension, Hyperlipidemia, Diabetes, and Chronic Pain  Care Plan : General Pharmacy (Adult)  Updates made by Germaine Pomfret, [place] since 05/07/2022 12:00 AM     Problem: Hypertension, Hyperlipidemia, Diabetes, and Chronic Pain   Priority: High     Long-Range Goal: Patient-Specific Goal   Start Date: 03/27/2021  Expected End Date: 05/08/2023  This Visit's Progress: On track  Recent Progress: On track  Priority: High  Note:    Current Barriers:  Unable to achieve control of diabetes   Pharmacist Clinical Goal(s):  Patient will maintain control of diabetes as evidenced by A1c less than 7%  through collaboration with PharmD and provider.   Interventions: 1:1 collaboration with Gwyneth Sprout, FNP regarding development and update of comprehensive plan of care as evidenced by provider attestation and co-signature Inter-disciplinary care team collaboration (see longitudinal plan of care) Comprehensive medication review performed; medication list updated in electronic medical record  Hypertension (BP goal <130/80) -Controlled -Current treatment: Amlodipine 5 mg daily: Appropriate, Effective, Safe, Accessible Telmisartan 20 mg daily  -Medications previously tried: NA  -Current home readings: Does not monitor at home -Denies hypotensive/hypertensive symptoms -Recommended to continue current medication  Hyperlipidemia: (LDL goal < 70) -Uncontrolled, but improved.  -Current treatment: Atorvastatin 20 mg daily  -Current treatment: Aspirin 81 mg daily  -Medications previously tried: NA  -Patient close to goal, but LDL still slightly elevated.   -Continue current medications  Diabetes (A1c goal <7%) -Controlled.  -Diagnosed 2018 -Current medications: Metformin 1000 mg 2 tablets twice daily: Appropriate, Effective, Safe, Accessible Trulicity 3 mg weekly on tuesdays: Appropriate, Effective, Safe, Accessible  -Medications previously tried: NA  -Current home glucose readings:  Fasting: 109, 120. Highest in recall was 150.  -Denies hypoglycemic symptoms.    -Current meal patterns: Significantly reduced appetite since starting Trulicity. Salads 2-3x weekly. Drinks mainly water. Snacks peanuts.  -Current exercise: Unable walk due to leg pain.  -Continue current medications.   Chronic Pain (Goal: Minimize pain symptoms) -Not ideally controlled: Not addressed during this visit. -Managed by Dr. Holley Raring -Large  components of spinal stenosis, musculoskeletal pain.  -Current treatment  Gabapentin 300 mg three times daily  -Medications previously tried: NA  -Recommended scheduling follow-up with pain management.  -Recommended to continue current medication  Patient Goals/Self-Care Activities Patient will:  - check  glucose daily before breakfast, document, and provide at future appointments  Follow Up Plan: Telephone follow up appointment with care management team member scheduled for:  06/28/2022 at 9:15 AM    Medication Assistance: None required.  Patient affirms current coverage meets needs.  Compliance/Adherence/Medication fill history: Care Gaps: Zoster Vaccines PNA Vaccine COVID-19 Vaccine Booster 3 Diabetic Eye Exam Influenza Vaccine  Star-Rating Drugs: Atorvastatin 20 mg last filled on 04/30/2022 for a 30-Day supply with [facility] Metformin 500 mg  last filled on 04/30/2022 for a 30-Day supply with [facility] Trulicity 3 mg was last filled on 04/30/2022 for a 30-Day supply with [facility]   Patient's preferred pharmacy is:  [facility] - [place], [place] - [address] [place] [address] Phone: [phone] Fax: [phone]  [place], [place] - [address] [place] [address] Phone: [phone] Fax: [phone]  Patient decided to: Utilize [facility] for medication synchronization, packaging and delivery  Care Plan and Follow Up Patient Decision:  Patient agrees to Care Plan and Follow-up.  Plan: Telephone follow up appointment with care management team member scheduled for:  06/28/2022 at [place], PharmD, [place], [place] [phone]

## 2022-04-29 ENCOUNTER — Ambulatory Visit (INDEPENDENT_AMBULATORY_CARE_PROVIDER_SITE_OTHER): Payer: Medicare Other | Admitting: Urology

## 2022-04-29 VITALS — BP 133/80 | HR 102 | Ht 69.0 in | Wt 199.0 lb

## 2022-04-29 DIAGNOSIS — N3020 Other chronic cystitis without hematuria: Secondary | ICD-10-CM

## 2022-04-29 DIAGNOSIS — R82998 Other abnormal findings in urine: Secondary | ICD-10-CM | POA: Diagnosis not present

## 2022-04-29 DIAGNOSIS — N390 Urinary tract infection, site not specified: Secondary | ICD-10-CM

## 2022-04-29 DIAGNOSIS — R350 Frequency of micturition: Secondary | ICD-10-CM

## 2022-04-29 DIAGNOSIS — R319 Hematuria, unspecified: Secondary | ICD-10-CM | POA: Diagnosis not present

## 2022-04-29 LAB — MICROSCOPIC EXAMINATION
Epithelial Cells (non renal): >10 /hpf — AB (ref 0–10)
WBC, UA: >30 /hpf — AB (ref 0–5)

## 2022-04-29 LAB — URINALYSIS, COMPLETE
Bilirubin, UA: NEGATIVE
Glucose, UA: NEGATIVE
Ketones, UA: NEGATIVE
Nitrite, UA: POSITIVE — AB
Specific Gravity, UA: 1.02 (ref 1.005–1.030)
Urobilinogen, Ur: 0.2 mg/dL (ref 0.2–1.0)
pH, UA: 5.5 (ref 5.0–7.5)

## 2022-04-29 MED ORDER — CIPROFLOXACIN HCL 250 MG PO TABS: 250.0000 mg | ORAL_TABLET | Freq: Two times a day (BID) | ORAL | 0 refills | Status: DC

## 2022-04-29 MED ORDER — NITROFURANTOIN MONOHYD MACRO 100 MG PO CAPS: 100.0000 mg | ORAL_CAPSULE | Freq: Every day | ORAL | 3 refills | Status: DC

## 2022-04-29 NOTE — Progress Notes (Signed)
04/29/2022 10:29 AM   Hilda Blades Herby Abraham 05-15-1960 [id number]  Referring provider: Gwyneth Sprout, [place],  [place] [address]  Chief Complaint  Patient presents with   Establish Care   Urinary Frequency    HPI: I was consulted to assess the patient for recurrent bladder infections.  She complains of worsening urge incontinence frequency and foul-smelling urine that respond favorably to antibiotics.  At baseline she voids every 2 hours and gets up 3 times at night.  Sometimes she leaks with coughing sneezing.  Sometimes she has urge incontinence.  Most days she does not wear a pad but sometimes she does  She thinks she may be infected today  She has had a hysterectomy kidney stone  She has had positive urine cultures in the medical record  No bladder surgery.   PMH: Past Medical History:  Diagnosis Date   Abdominal pain, epigastric    Acute cystitis without hematuria 04/19/2021   Anemia    Backache    Bronchitis    Calculus, kidney    Carpal tunnel syndrome    Cellulitis of face 05/30/2016   Chest pain    Chicken pox    Circulatory disease    Diabetes mellitus without complication (HCC)    Disorder of kidney and ureter    Excess, menstruation    Hypercholesteremia    Hypertension, essential, benign    Infected postoperative seroma    Infected surgical wound 09/15/2008   Iron deficiency    Leg pain 02/02/2015   Malaise and fatigue    Measles    MRSA (methicillin resistant staph aureus) culture positive    Nausea alone    Nonspecific abnormal electrocardiogram (ECG) (EKG)    Pain in elbow 02/05/2016   Pain in joint, lower leg    Pain in shoulder 12/05/2011   Sleep apnea     Surgical History: Past Surgical History:  Procedure Laterality Date   ABDOMINAL HYSTERECTOMY     CESAREAN SECTION     COLONOSCOPY  1997   COLONOSCOPY WITH PROPOFOL N/A 01/09/2018   Procedure: COLONOSCOPY WITH PROPOFOL;  Surgeon: Virgel Manifold, MD;   Location: [facility] ENDOSCOPY;  Service: Endoscopy;  Laterality: N/A;   COLONOSCOPY WITH PROPOFOL N/A 08/20/2019   Procedure: COLONOSCOPY WITH PROPOFOL;  Surgeon: Lucilla Lame, MD;  Location: [facility] ENDOSCOPY;  Service: Endoscopy;  Laterality: N/A;   COLOSTOMY TAKEDOWN  2012   Hartmann's procedure.     HERNIA REPAIR  2563   umbilical   LITHOTRIPSY  8937   with complications, hospitalized due to these complications for 3 months   mrsa     removal on nose   PARTIAL HYSTERECTOMY  2009    vaginal hysterectomy, has both ovaries    Home Medications:  Allergies as of 04/29/2022       Reactions   Sulfamethoxazole-trimethoprim Swelling, Hives        Medication List        Accurate as of April 29, 2022 10:29 AM. If you have any questions, ask your nurse or doctor.          acetaminophen 500 MG tablet Commonly known as: TYLENOL Take 500 mg by mouth every 6 (six) hours as needed.   amLODipine 5 MG tablet Commonly known as: NORVASC TAKE ONE TABLET BY MOUTH EVERY MORNING   aspirin EC 81 MG tablet Commonly known as: Aspirin Low Dose TAKE ONE TABLET BY MOUTH ONCE DAILY SWALLOW whole   atorvastatin 20 MG tablet Commonly known  as: LIPITOR TAKE ONE TABLET BY MOUTH ONCE DAILY   Easy Mini Eject Lancing Device Misc   gabapentin 300 MG capsule Commonly known as: Neurontin Take 1 capsule (300 mg total) by mouth 3 (three) times daily.   IBUPROFEN PM PO Take by mouth at bedtime as needed.   metFORMIN 500 MG tablet Commonly known as: GLUCOPHAGE Take 2 tablets (1,000 mg total) by mouth 2 (two) times daily with a meal.   OneTouch Ultra test strip Generic drug: glucose blood Use to monitor blood sugars three times daily as directed   onetouch ultrasoft lancets Use to monitor blood sugars three times daily as directed   oxybutynin 10 MG 24 hr tablet Commonly known as: Ditropan XL Take 1 tablet (10 mg total) by mouth at bedtime.   telmisartan 20 MG tablet Commonly known as:  MICARDIS TAKE ONE TABLET BY MOUTH ONCE DAILY   Trulicity 3 RX/5.4MG Sopn Generic drug: Dulaglutide Inject 3 mg as directed once a week.        Allergies:  Allergies  Allergen Reactions   Sulfamethoxazole-Trimethoprim Swelling and Hives    Family History: Family History  Problem Relation Age of Onset   Hypertension Father    Stroke Father    Diabetes Mother    Hypertension Mother    Diabetes Other        sibling   Diabetes Other        sibling   Diabetes Other        sibling   Hypertension Other        sibling   Hypertension Other        sibling   Hypertension Other        sibling    Social History:  reports that she has never smoked. She has never used smokeless tobacco. She reports that she does not drink alcohol and does not use drugs.  ROS:                                        Physical Exam: There were no vitals taken for this visit.  Constitutional:  Alert and oriented, No acute distress. HEENT: [place] AT, moist mucus membranes.  Trachea midline, no masses.   Laboratory Data: Lab Results  Component Value Date   WBC 4.8 02/08/2022   HGB 11.2 02/08/2022   HCT 34.7 02/08/2022   MCV 97 02/08/2022   PLT 303 02/08/2022    Lab Results  Component Value Date   CREATININE 1.24 (H) 02/08/2022    No results found for: "PSA"  No results found for: "TESTOSTERONE"  Lab Results  Component Value Date   HGBA1C 6.0 (H) 02/08/2022    Urinalysis    Component Value Date/Time   COLORURINE YELLOW 07/07/2017 1715   APPEARANCEUR HAZY (A) 07/07/2017 1715   APPEARANCEUR HAZY 03/25/2014 0925   LABSPEC 1.009 07/07/2017 1715   LABSPEC 1.015 03/25/2014 0925   PHURINE 5.0 07/07/2017 1715   GLUCOSEU 50 (A) 07/07/2017 1715   GLUCOSEU NEGATIVE 03/25/2014 0925   HGBUR MODERATE (A) 07/07/2017 1715   BILIRUBINUR negative 12/19/2021 0945   BILIRUBINUR NEGATIVE 03/25/2014 0925   KETONESUR NEGATIVE 07/07/2017 1715   PROTEINUR Positive (A)  12/19/2021 0945   PROTEINUR NEGATIVE 07/07/2017 1715   UROBILINOGEN 0.2 12/19/2021 0945   NITRITE positive 12/19/2021 0945   NITRITE POSITIVE (A) 07/07/2017 1715   LEUKOCYTESUR Moderate (2+) (A) 12/19/2021 0945  LEUKOCYTESUR NEGATIVE 03/25/2014 0981    Pertinent Imaging: Urine reviewed.  Chart reviewed.  Urine sent for culture  Assessment & Plan: Pathophysiology of UTIs discussed.  Call if renal ultrasound is normal.  Call in ciprofloxacin 250 mg twice a day for 7 days.  Then placed patient on nitrofurantoin 100 mg 3 x 11.  Have patient come back and see me for cystoscopy in approximately 6 to 7 weeks  1. Urinary frequency  - Urinalysis, Complete   No follow-ups on file.  Reece Packer, MD  [place] [address] [place], [place] [address] [phone]

## 2022-05-01 ENCOUNTER — Telehealth: Payer: Self-pay

## 2022-05-01 NOTE — Telephone Encounter (Signed)
Pt LM on triage line stating that she believes she is having a reaction to the mediation we started her on. Called pt to get specific symptoms. No answer, LM for pt to call back.

## 2022-05-02 ENCOUNTER — Telehealth: Payer: Self-pay

## 2022-05-02 LAB — CULTURE, URINE COMPREHENSIVE

## 2022-05-02 MED ORDER — AMOXICILLIN-POT CLAVULANATE 875-125 MG PO TABS: 1.0000 | ORAL_TABLET | Freq: Two times a day (BID) | ORAL | 0 refills | Status: DC

## 2022-05-02 NOTE — Telephone Encounter (Signed)
Spoke with patient in regards to the urine culture and per Dr Thomos Lemons, MD  Kris Mouton, CMA Stop the cipro and take augmentin 875/125 po bid for 7 day  RX sent to [facility] on [address]

## 2022-05-07 NOTE — Telephone Encounter (Signed)
Pt states when starting the cipro her eyes and lips became swollen. Her lips where itchy. NO new foods, drinks, chap stick, or lip stick.   On 12/21 - Per Macdiarmid- changed cipro to Augmentin. Sx improving. Added cipro allergy list.

## 2022-05-09 ENCOUNTER — Encounter: Payer: Self-pay | Admitting: Student in an Organized Health Care Education/Training Program

## 2022-05-09 ENCOUNTER — Ambulatory Visit
Payer: Medicare Other | Attending: Student in an Organized Health Care Education/Training Program | Admitting: Student in an Organized Health Care Education/Training Program

## 2022-05-09 VITALS — BP 129/67 | HR 97 | Temp 97.2°F | Resp 16 | Ht 69.0 in | Wt 205.0 lb

## 2022-05-09 DIAGNOSIS — M48062 Spinal stenosis, lumbar region with neurogenic claudication: Secondary | ICD-10-CM

## 2022-05-09 DIAGNOSIS — G894 Chronic pain syndrome: Secondary | ICD-10-CM | POA: Diagnosis not present

## 2022-05-09 DIAGNOSIS — M5416 Radiculopathy, lumbar region: Secondary | ICD-10-CM | POA: Diagnosis not present

## 2022-05-09 MED ORDER — GABAPENTIN 400 MG PO CAPS: 400.0000 mg | ORAL_CAPSULE | Freq: Three times a day (TID) | ORAL | 5 refills | Status: DC

## 2022-05-09 NOTE — Progress Notes (Signed)
PROVIDER NOTE: Information contained herein reflects review and annotations entered in association with encounter. Interpretation of such information and data should be left to medically-trained personnel. Information provided to patient can be located elsewhere in the medical record under "Patient Instructions". Document created using STT-dictation technology, any transcriptional errors that may result from process are unintentional.    Patient: Emily Stokes  Service Category: E/M  Provider: Gillis Santa, MD  DOB: [birth year]  DOS: 05/09/2022  Specialty: Interventional Pain Management  MRN: [id number]  Setting: Ambulatory outpatient  PCP: Gwyneth Sprout, FNP  Type: Established Patient    Referring Provider: Gwyneth Sprout, FNP  Location: Office  Delivery: Face-to-face     HPI  [name], a 61 y.o. year old female, is here today because of her Lumbar radiculopathy [M54.16]. [name]'s primary complain today is Leg Pain (Left ) and Hip Pain (Left ) Last encounter: My last encounter with her was on 04/24/21 Pertinent problems: [name] has Arthralgia of lower leg; Lumbar degenerative disc disease; Lumbar radiculopathy; Chronic pain syndrome; Lumbar facet arthropathy; and Lumbar spondylosis on their pertinent problem list. Pain Assessment: Severity of Chronic pain is reported as a 10-Worst pain ever/10. Location: Leg (left hip) Left/? hip pain into left leg. Onset: More than a month ago. Quality: Discomfort, Contraction, Sharp. Timing: Constant. Modifying factor(s): nothing currently. Vitals:  height is _0  (1.753 m) and weight is 205 lb (93 kg). Her temporal temperature is 97.2 F (36.2 C) (abnormal). Her blood pressure is 129/67 and her pulse is 97. Her respiration is 16 and oxygen saturation is 99%.   Reason for encounter: medication management.   Presents today for refill of Gabapentin, discussed increasing dose from 300 mg TID--> 400 mg TID. Continues to have low back pain with  radiation to her lateral hip and down her medial thigh/groin in a dermatomal fashion, has had previous L-ESI's but wants to hold off for now Dealing with UTI, currently on abx  ROS  Constitutional: Denies any fever or chills Gastrointestinal: No reported hemesis, hematochezia, vomiting, or acute GI distress Musculoskeletal:  low back pain  + left leg pain Neurological: No reported episodes of acute onset apraxia, aphasia, dysarthria, agnosia, amnesia, paralysis, loss of coordination, or loss of consciousness  Medication Review  Dulaglutide, Easy Mini Eject Lancing Device, Ibuprofen-diphenhydrAMINE Cit, acetaminophen, amLODipine, amoxicillin-clavulanate, aspirin EC, atorvastatin, gabapentin, glucose blood, metFORMIN, nitrofurantoin (macrocrystal-monohydrate), onetouch ultrasoft, oxybutynin, and telmisartan  History Review  Allergy: [name] is allergic to sulfamethoxazole-trimethoprim and ciprofloxacin. Drug: [name]  reports no history of drug use. Alcohol:  reports no history of alcohol use. Tobacco:  reports that she has never smoked. She has never used smokeless tobacco. Social: [name]  reports that she has never smoked. She has never used smokeless tobacco. She reports that she does not drink alcohol and does not use drugs. Medical:  has a past medical history of Abdominal pain, epigastric, Acute cystitis without hematuria (04/19/2021), Anemia, Backache, Bronchitis, Calculus, kidney, Carpal tunnel syndrome, Cellulitis of face (05/30/2016), Chest pain, Chicken pox, Circulatory disease, Diabetes mellitus without complication ([place]), Disorder of kidney and ureter, Excess, menstruation, Hypercholesteremia, Hypertension, essential, benign, Infected postoperative seroma, Infected surgical wound (09/15/2008), Iron deficiency, Leg pain (02/02/2015), Malaise and fatigue, Measles, MRSA (methicillin resistant staph aureus) culture positive, Nausea alone, Nonspecific abnormal electrocardiogram  (ECG) (EKG), Pain in elbow (02/05/2016), Pain in joint, lower leg, Pain in shoulder (12/05/2011), and Sleep apnea. Surgical: [name]  has a past surgical history that includes  Colonoscopy (1997); Partial hysterectomy (2009); Cesarean section; Lithotripsy (1997); mrsa; Hernia repair (2012); Abdominal hysterectomy; Hartmann's procedure.; Colostomy takedown (2012); Colonoscopy with propofol (N/A, 01/09/2018); and Colonoscopy with propofol (N/A, 08/20/2019). Family: family history includes Diabetes in her mother and other family members; Hypertension in her father, mother, and other family members; Stroke in her father.  Laboratory Chemistry Profile   Renal Lab Results  Component Value Date   BUN 18 02/08/2022   CREATININE 1.24 (H) 02/08/2022   BCR 15 02/08/2022   GFRAA 63 06/06/2020   GFRNONAA 55 (L) 06/06/2020     Hepatic Lab Results  Component Value Date   AST 12 02/08/2022   ALT 9 02/08/2022   ALBUMIN 4.4 02/08/2022   ALKPHOS 95 02/08/2022   LIPASE 26 07/07/2017     Electrolytes Lab Results  Component Value Date   NA 140 02/08/2022   K 4.3 02/08/2022   CL 103 02/08/2022   CALCIUM 9.7 02/08/2022     Bone No results found for: "VD25OH", "VD125OH2TOT", "HQ4696EX5", "MW4132GM0", "25OHVITD1", "25OHVITD2", "25OHVITD3", "TESTOFREE", "TESTOSTERONE"   Inflammation (CRP: Acute Phase) (ESR: Chronic Phase) No results found for: "CRP", "ESRSEDRATE", "LATICACIDVEN"     Note: Above Lab results reviewed.  General appearance: Well nourished, well developed, and well hydrated. In no apparent acute distress Mental status: Alert, oriented x 3 (person, place, & time)       Respiratory: No evidence of acute respiratory distress Eyes: PERLA Vitals: BP 129/67 (BP Location: Right Arm, Patient Position: Sitting, Cuff Size: Large)   Pulse 97   Temp (!) 97.2 F (36.2 C) (Temporal)   Resp 16   Ht _0  (1.753 m)   Wt 205 lb (93 kg)   SpO2 99%   BMI 30.27 kg/m  BMI: Estimated body mass index  is 30.27 kg/m as calculated from the following:   Height as of this encounter: _1  (1.753 m).   Weight as of this encounter: 205 lb (93 kg). Ideal: Ideal body weight: 66.2 kg (145 lb 15.1 oz) Adjusted ideal body weight: 76.9 kg (169 lb 9.1 oz)  Lumbar Spine Area Exam  Skin & Axial Inspection: No masses, redness, or swelling Alignment: Symmetrical Functional ROM: Pain restricted ROM affecting primarily the left Stability: No instability detected Muscle Tone/Strength: Functionally intact. No obvious neuro-muscular anomalies detected. Sensory (Neurological): Dermatomal pain pattern left l3, l4 Palpation: No palpable anomalies       Provocative Tests: Hyperextension/rotation test: deferred today       Lumbar quadrant test (Kemp's test): (+) on the left for foraminal stenosis Lateral bending test: (+) ipsilateral radicular pain, on the left. Positive for left-sided foraminal stenosis.   Gait & Posture Assessment  Ambulation: Unassisted Gait: Relatively normal for age and body habitus Posture: Difficulty standing up straight, due to pain  Lower Extremity Exam    Side: Right lower extremity  Side: Left lower extremity  Stability: No instability observed          Stability: No instability observed          Skin & Extremity Inspection: Skin color, temperature, and hair growth are WNL. No peripheral edema or cyanosis. No masses, redness, swelling, asymmetry, or associated skin lesions. No contractures.  Skin & Extremity Inspection: Skin color, temperature, and hair growth are WNL. No peripheral edema or cyanosis. No masses, redness, swelling, asymmetry, or associated skin lesions. No contractures.  Functional ROM: Unrestricted ROM                  Functional  ROM: Pain restricted ROM for hip joint Limited SLR (straight leg raise)  Muscle Tone/Strength: Functionally intact. No obvious neuro-muscular anomalies detected.  Muscle Tone/Strength: Functionally intact. No obvious neuro-muscular  anomalies detected.  Sensory (Neurological): Unimpaired        Sensory (Neurological): Dermatomal pain pattern        DTR: Patellar: deferred today Achilles: deferred today Plantar: deferred today  DTR: Patellar: deferred today Achilles: deferred today Plantar: deferred today  Palpation: No palpable anomalies  Palpation: No palpable anomalies    Assessment   Status Diagnosis  Controlled Controlled Controlled 1. Lumbar radiculopathy (left L3/4)   2. Spinal stenosis, lumbar region, with neurogenic claudication   3. Chronic pain syndrome         Plan of Care  Ms. NORIKO MACARI has a current medication list which includes the following long-term medication(s): amlodipine, atorvastatin, gabapentin, ibuprofen-diphenhydramine cit, metformin, and telmisartan.  Pharmacotherapy (Medications Ordered): Meds ordered this encounter  Medications   gabapentin (NEURONTIN) 400 MG capsule    Sig: Take 1 capsule (400 mg total) by mouth 3 (three) times daily.    Dispense:  90 capsule    Refill:  5   Follow-up plan:   Return in about 6 months (around 11/08/2022) for Medication Management, in person.   Recent Visits No visits were found meeting these conditions. Showing recent visits within past 90 days and meeting all other requirements Today's Visits Date Type Provider Dept  05/09/22 Office Visit Gillis Santa, MD [facility]  Showing today's visits and meeting all other requirements Future Appointments No visits were found meeting these conditions. Showing future appointments within next 90 days and meeting all other requirements  I discussed the assessment and treatment plan with the patient. The patient was provided an opportunity to ask questions and all were answered. The patient agreed with the plan and demonstrated an understanding of the instructions.  Patient advised to call back or seek an in-person evaluation if the symptoms or condition worsens.  Duration of  encounter: 20 minutes.  Note by: Gillis Santa, MD Date: 05/09/2022; Time: 9:48 AM

## 2022-05-09 NOTE — Progress Notes (Signed)
Safety precautions to be maintained throughout the outpatient stay will include: orient to surroundings, keep bed in low position, maintain call bell within reach at all times, provide assistance with transfer out of bed and ambulation.  

## 2022-05-12 DIAGNOSIS — E1159 Type 2 diabetes mellitus with other circulatory complications: Secondary | ICD-10-CM

## 2022-05-12 DIAGNOSIS — E1165 Type 2 diabetes mellitus with hyperglycemia: Secondary | ICD-10-CM

## 2022-05-12 DIAGNOSIS — I152 Hypertension secondary to endocrine disorders: Secondary | ICD-10-CM

## 2022-05-16 ENCOUNTER — Ambulatory Visit (INDEPENDENT_AMBULATORY_CARE_PROVIDER_SITE_OTHER): Payer: Medicare Other

## 2022-05-16 VITALS — Ht 69.0 in | Wt 205.0 lb

## 2022-05-16 DIAGNOSIS — Z0000 Encounter for general adult medical examination without abnormal findings: Secondary | ICD-10-CM | POA: Diagnosis not present

## 2022-05-16 NOTE — Progress Notes (Signed)
Virtual Visit via Telephone Note  I connected with  Emily Stokes on 05/16/22 at 10:00 AM EST by telephone and verified that I am speaking with the correct person using two identifiers.  Location: Patient: home Provider: [facility] Persons participating in the virtual visit: [place]   I discussed the limitations, risks, security and privacy concerns of performing an evaluation and management service by telephone and the availability of in person appointments. The patient expressed understanding and agreed to proceed.  Interactive audio and video telecommunications were attempted between this nurse and patient, however failed, due to patient having technical difficulties OR patient did not have access to video capability.  We continued and completed visit with audio only.  Some vital signs may be absent or patient reported.   Dionisio David, LPN  Subjective:   Emily Stokes is a 62 y.o. female who presents for Medicare Annual (Subsequent) preventive examination.  Review of Systems           Objective:    Today's Vitals   05/16/22 1002  PainSc: 5    There is no height or weight on file to calculate BMI.     05/09/2021    1:40 PM 12/20/2020    9:02 AM 05/17/2020   10:05 AM 05/03/2020    2:37 PM 04/27/2020    2:25 PM 10/21/2019   10:48 AM 08/20/2019    8:19 AM  Advanced Directives  Does Patient Have a Medical Advance Directive? _0  No No  Would patient like information on creating a medical advance directive? No - Patient declined No - Patient declined No - Patient declined No - Patient declined No - Patient declined      Current Medications (verified) Outpatient Encounter Medications as of 05/16/2022  Medication Sig   acetaminophen (TYLENOL) 500 MG tablet Take 500 mg by mouth every 6 (six) hours as needed.   amLODipine (NORVASC) 5 MG tablet TAKE ONE TABLET BY MOUTH EVERY MORNING   amoxicillin-clavulanate (AUGMENTIN) 875-125 MG tablet Take 1  tablet by mouth 2 (two) times daily.   aspirin EC (ASPIRIN LOW DOSE) 81 MG tablet TAKE ONE TABLET BY MOUTH ONCE DAILY SWALLOW whole   atorvastatin (LIPITOR) 20 MG tablet TAKE ONE TABLET BY MOUTH ONCE DAILY   gabapentin (NEURONTIN) 400 MG capsule Take 1 capsule (400 mg total) by mouth 3 (three) times daily.   glucose blood (ONETOUCH ULTRA) test strip Use to monitor blood sugars three times daily as directed   Ibuprofen-diphenhydrAMINE Cit (IBUPROFEN PM PO) Take by mouth at bedtime as needed.   Lancet Devices (EASY MINI EJECT LANCING DEVICE) MISC    Lancets (ONETOUCH ULTRASOFT) lancets Use to monitor blood sugars three times daily as directed   metFORMIN (GLUCOPHAGE) 500 MG tablet Take 2 tablets (1,000 mg total) by mouth 2 (two) times daily with a meal.   nitrofurantoin, macrocrystal-monohydrate, (MACROBID) 100 MG capsule Take 1 capsule (100 mg total) by mouth daily.   oxybutynin (DITROPAN XL) 10 MG 24 hr tablet Take 1 tablet (10 mg total) by mouth at bedtime.   telmisartan (MICARDIS) 20 MG tablet TAKE ONE TABLET BY MOUTH ONCE DAILY   TRULICITY 3 MV/7.8IO SOPN Inject 3 mg as directed once a week.   No facility-administered encounter medications on file as of 05/16/2022.    Allergies (verified) Sulfamethoxazole-trimethoprim and Ciprofloxacin   History: Past Medical History:  Diagnosis Date   Abdominal pain, epigastric    Acute cystitis without hematuria 04/19/2021  Anemia    Backache    Bronchitis    Calculus, kidney    Carpal tunnel syndrome    Cellulitis of face 05/30/2016   Chest pain    Chicken pox    Circulatory disease    Diabetes mellitus without complication (HCC)    Disorder of kidney and ureter    Excess, menstruation    Hypercholesteremia    Hypertension, essential, benign    Infected postoperative seroma    Infected surgical wound 09/15/2008   Iron deficiency    Leg pain 02/02/2015   Malaise and fatigue    Measles    MRSA (methicillin resistant staph aureus)  culture positive    Nausea alone    Nonspecific abnormal electrocardiogram (ECG) (EKG)    Pain in elbow 02/05/2016   Pain in joint, lower leg    Pain in shoulder 12/05/2011   Sleep apnea    Past Surgical History:  Procedure Laterality Date   ABDOMINAL HYSTERECTOMY     CESAREAN SECTION     COLONOSCOPY  1997   COLONOSCOPY WITH PROPOFOL N/A 01/09/2018   Procedure: COLONOSCOPY WITH PROPOFOL;  Surgeon: Virgel Manifold, MD;  Location: [facility] ENDOSCOPY;  Service: Endoscopy;  Laterality: N/A;   COLONOSCOPY WITH PROPOFOL N/A 08/20/2019   Procedure: COLONOSCOPY WITH PROPOFOL;  Surgeon: Lucilla Lame, MD;  Location: [facility] ENDOSCOPY;  Service: Endoscopy;  Laterality: N/A;   COLOSTOMY TAKEDOWN  2012   Hartmann's procedure.     HERNIA REPAIR  2426   umbilical   LITHOTRIPSY  [id number]   with complications, hospitalized due to these complications for 3 months   mrsa     removal on nose   PARTIAL HYSTERECTOMY  2009    vaginal hysterectomy, has both ovaries   Family History  Problem Relation Age of Onset   Hypertension Father    Stroke Father    Diabetes Mother    Hypertension Mother    Diabetes Other        sibling   Diabetes Other        sibling   Diabetes Other        sibling   Hypertension Other        sibling   Hypertension Other        sibling   Hypertension Other        sibling   Social History   Socioeconomic History   Marital status: Widowed    Spouse name: Not on file   Number of children: 4   Years of education: Not on file   Highest education level: Bachelor's degree (e.g., BA, AB, BS)  Occupational History   Occupation: Control and instrumentation engineer    Comment: disability  Tobacco Use   Smoking status: Never   Smokeless tobacco: Never  Vaping Use   Vaping Use: Never used  Substance and Sexual Activity   Alcohol use: Never   Drug use: Never   Sexual activity: Never  Other Topics Concern   Not on file  Social History Narrative   Not on file   Social Determinants of  Health   Financial Resource Strain: Medium Risk (07/06/2021)   Overall Financial Resource Strain (CARDIA)    Difficulty of Paying Living Expenses: Somewhat hard  Food Insecurity: No Food Insecurity (12/14/2021)   Hunger Vital Sign    Worried About Running Out of Food in the Last Year: Never true    Ran Out of Food in the Last Year: Never true  Transportation Needs: No Transportation Needs (12/14/2021)  PRAPARE - Hydrologist (Medical): No    Lack of Transportation (Non-Medical): No  Physical Activity: Inactive (05/09/2021)   Exercise Vital Sign    Days of Exercise per Week: 0 days    Minutes of Exercise per Session: 0 min  Stress: No Stress Concern Present (05/09/2021)   [place]    Feeling of Stress : Not at all  Social Connections: Moderately Isolated (05/09/2021)   Social Connection and Isolation Panel [NHANES]    Frequency of Communication with Friends and Family: More than three times a week    Frequency of Social Gatherings with Friends and Family: Once a week    Attends Religious Services: More than 4 times per year    Active Member of [employer] or Organizations: No    Attends Archivist Meetings: Never    Marital Status: Widowed    Tobacco Counseling Counseling given: Not Answered   Clinical Intake:  Pre-visit preparation completed: Yes  Pain : 0-10 Pain Score: 5  Pain Type: Chronic pain Pain Location: Back     Nutritional Risks: None Diabetes: Yes CBG done?: No Did pt. bring in CBG monitor from home?: No  How often do you need to have someone help you when you read instructions, pamphlets, or other written materials from your doctor or pharmacy?: 1 - Never  Diabetic?yes Nutrition Risk Assessment:  Has the patient had any N/V/D within the last 2 months?  No  Does the patient have any non-healing wounds?  No  Has the patient had any unintentional weight  loss or weight gain?  No   Diabetes:  Is the patient diabetic?  Yes  If diabetic, was a CBG obtained today?  No  Did the patient bring in their glucometer from home?  No  How often do you monitor your CBG's? Every day.   Financial Strains and Diabetes Management:  Are you having any financial strains with the device, your supplies or your medication? No .  Does the patient want to be seen by Chronic Care Management for management of their diabetes?  No  Would the patient like to be referred to a Nutritionist or for Diabetic Management?  No   Diabetic Exams:  Diabetic Eye Exam: Completed 09/15/19. Overdue for diabetic eye exam. Pt has been advised about the importance in completing this exam.   Diabetic Foot Exam: Completed 07/05/21. Pt has been advised about the importance in completing this exam.     Interpreter Needed?: No  Information entered by :: Kirke Shaggy, LPN   Activities of Daily Living    12/19/2021    9:01 AM 10/16/2021    9:24 AM  In your present state of health, do you have any difficulty performing the following activities:  Hearing? 0 0  Vision? 1 1  Difficulty concentrating or making decisions? 0 0  Walking or climbing stairs? 1 1  Dressing or bathing? 0 0  Doing errands, shopping? 0 0    Patient Care Team: Gwyneth Sprout, FNP as PCP - General (Family Medicine) [place], [place] (Optometry) Gillis Santa, MD as Consulting Physician (Pain Medicine) Neldon Labella, RN as Case Manager Germaine Pomfret, [facility] (Pharmacist)  Indicate any recent Medical Services you may have received from other than [facility] providers in the past year (date may be approximate).     Assessment:   This is a routine wellness examination for [place].  Hearing/Vision screen No results  found.  Dietary issues and exercise activities discussed:     Goals Addressed   None    Depression Screen    02/08/2022    8:46 AM 12/19/2021    9:47 AM 12/19/2021    9:00 AM 10/16/2021     9:23 AM 07/17/2021    9:48 AM 07/05/2021    8:56 AM 05/09/2021    1:39 PM  PHQ 2/9 Scores  PHQ - 2 Score 0 0 0 0 0 0 0  PHQ- 9 Score _0 Fall Risk    05/09/2022    9:26 AM 02/08/2022    8:48 AM 12/19/2021    9:47 AM 12/19/2021    9:00 AM 10/16/2021    9:23 AM  Fall Risk   Falls in the past year? 0 0 0 0 1  Number falls in past yr: 0 0 0 0 1  Injury with Fall?  1 0 0 0  Risk for fall due to : No Fall Risks      Follow up Falls evaluation completed        FALL RISK PREVENTION PERTAINING TO THE HOME:  Any stairs in or around the home? Yes  If so, are there any without handrails? No  Home free of loose throw rugs in walkways, pet beds, electrical cords, etc? Yes  Adequate lighting in your home to reduce risk of falls? Yes   ASSISTIVE DEVICES UTILIZED TO PREVENT FALLS:  Life alert? No  Use of a cane, walker or w/c? Yes  Grab bars in the bathroom? Yes  Shower chair or bench in shower? No  Elevated toilet seat or a handicapped toilet? No    Cognitive Function:        10/24/2016    2:54 PM  6CIT Screen  What Year? 0 points  What month? 0 points  What time? 0 points  Count back from 20 0 points  Months in reverse 0 points  Repeat phrase 6 points  Total Score 6 points    Immunizations Immunization History  Administered Date(s) Administered   Influenza Split 05/26/2007   Influenza,inj,Quad PF,6+ Mos 05/31/2016, 05/19/2017, 01/30/2018, 02/11/2020, 07/05/2021, 02/08/2022   PFIZER(Purple Top)SARS-COV-2 Vaccination 08/05/2019, 08/31/2019   Pneumococcal Polysaccharide-23 01/30/2018    TDAP status: Due, Education has been provided regarding the importance of this vaccine. Advised may receive this vaccine at local pharmacy or Health Dept. Aware to provide a copy of the vaccination record if obtained from local pharmacy or Health Dept. Verbalized acceptance and understanding.  Flu Vaccine status: Up to date  Pneumococcal vaccine status: Up to date  Covid-19  vaccine status: Completed vaccines  Qualifies for Shingles Vaccine? Yes   Zostavax completed No   Shingrix Completed?: No.    Education has been provided regarding the importance of this vaccine. Patient has been advised to call insurance company to determine out of pocket expense if they have not yet received this vaccine. Advised may also receive vaccine at local pharmacy or Health Dept. Verbalized acceptance and understanding.  Screening Tests Health Maintenance  Topic Date Due   DTaP/Tdap/Td (1 - Tdap) Never done   Zoster Vaccines- Shingrix (1 of 2) Never done   COVID-19 Vaccine (3 - Pfizer risk series) 09/28/2019   OPHTHALMOLOGY EXAM  09/14/2020   Diabetic kidney evaluation - Urine ACR  07/05/2022   FOOT EXAM  07/05/2022   HEMOGLOBIN A1C  08/09/2022   MAMMOGRAM  01/12/2023   Diabetic kidney evaluation -  eGFR measurement  02/09/2023   Medicare Annual Wellness (AWV)  05/17/2023   COLONOSCOPY (Pts 45-39yr Insurance coverage will need to be confirmed)  08/19/2024   INFLUENZA VACCINE  Completed   Hepatitis C Screening  Completed   HIV Screening  Completed   HPV VACCINES  Aged Out    Health Maintenance  Health Maintenance Due  Topic Date Due   DTaP/Tdap/Td (1 - Tdap) Never done   Zoster Vaccines- Shingrix (1 of 2) Never done   COVID-19 Vaccine (3 - Pfizer risk series) 09/28/2019   OPHTHALMOLOGY EXAM  09/14/2020    Colorectal cancer screening: Type of screening: Colonoscopy. Completed 08/20/19. Repeat every 5 years  Mammogram status: Completed 01/11/22. Repeat every year  Lung Cancer Screening: (Low Dose CT Chest recommended if Age 63-80years, 30 pack-year currently smoking OR have quit w/in 15years.) does not qualify.   Additional Screening:  Hepatitis C Screening: does qualify; Completed 10/24/16  Vision Screening: Recommended annual ophthalmology exams for early detection of glaucoma and other disorders of the eye. Is the patient up to date with their annual eye exam?   Yes  Who is the provider or what is the name of the office in which the patient attends annual eye exams[place] is not established with a provider, would they like to be referred to a provider to establish care? No .   Dental Screening: Recommended annual dental exams for proper oral hygiene  Community Resource Referral / Chronic Care Management: CRR required this visit?  No   CCM required this visit?  No      Plan:     I have personally reviewed and noted the following in the patient's chart:   Medical and social history Use of alcohol, tobacco or illicit drugs  Current medications and supplements including opioid prescriptions. Patient is not currently taking opioid prescriptions. Functional ability and status Nutritional status Physical activity Advanced directives List of other physicians Hospitalizations, surgeries, and ER visits in previous 12 months Vitals Screenings to include cognitive, depression, and falls Referrals and appointments  In addition, I have reviewed and discussed with patient certain preventive protocols, quality metrics, and best practice recommendations. A written personalized care plan for preventive services as well as general preventive health recommendations were provided to patient.     LDionisio David LPN   15/11/9036  Nurse Notes: none

## 2022-05-16 NOTE — Patient Instructions (Signed)
Emily Stokes , Thank you for taking time to come for your Medicare Wellness Visit. I appreciate your ongoing commitment to your health goals. Please review the following plan we discussed and let me know if I can assist you in the future.   Screening recommendations/referrals: Colonoscopy: 08/20/19 Mammogram: 01/11/22 Recommended yearly ophthalmology/optometry visit for glaucoma screening and checkup Recommended yearly dental visit for hygiene and checkup  Vaccinations: Influenza vaccine: 02/08/22 Pneumococcal vaccine: 01/30/18 Tdap vaccine: n/d Shingles vaccine: n/d  Covid-19: 08/05/19, 08/31/19  Advanced directives: no  Conditions/risks identified: none  Next appointment: Follow up in one year for your annual wellness visit. 05/19/23 @ 3 pm by phone  Preventive Care 40-64 Years, Female Preventive care refers to lifestyle choices and visits with your health care provider that can promote health and wellness. What does preventive care include? A yearly physical exam. This is also called an annual well check. Dental exams once or twice a year. Routine eye exams. Ask your health care provider how often you should have your eyes checked. Personal lifestyle choices, including: Daily care of your teeth and gums. Regular physical activity. Eating a healthy diet. Avoiding tobacco and drug use. Limiting alcohol use. Practicing safe sex. Taking low-dose aspirin daily starting at age 59. Taking vitamin and mineral supplements as recommended by your health care provider. What happens during an annual well check? The services and screenings done by your health care provider during your annual well check will depend on your age, overall health, lifestyle risk factors, and family history of disease. Counseling  Your health care provider may ask you questions about your: Alcohol use. Tobacco use. Drug use. Emotional well-being. Home and relationship well-being. Sexual activity. Eating  habits. Work and work Statistician. Method of birth control. Menstrual cycle. Pregnancy history. Screening  You may have the following tests or measurements: Height, weight, and BMI. Blood pressure. Lipid and cholesterol levels. These may be checked every 5 years, or more frequently if you are over 70 years old. Skin check. Lung cancer screening. You may have this screening every year starting at age 59 if you have a 30-pack-year history of smoking and currently smoke or have quit within the past 15 years. Fecal occult blood test (FOBT) of the stool. You may have this test every year starting at age 26. Flexible sigmoidoscopy or colonoscopy. You may have a sigmoidoscopy every 5 years or a colonoscopy every 10 years starting at age 80. Hepatitis C blood test. Hepatitis B blood test. Sexually transmitted disease (STD) testing. Diabetes screening. This is done by checking your blood sugar (glucose) after you have not eaten for a while (fasting). You may have this done every 1-3 years. Mammogram. This may be done every 1-2 years. Talk to your health care provider about when you should start having regular mammograms. This may depend on whether you have a family history of breast cancer. BRCA-related cancer screening. This may be done if you have a family history of breast, ovarian, tubal, or peritoneal cancers. Pelvic exam and Pap test. This may be done every 3 years starting at age 47. Starting at age 65, this may be done every 5 years if you have a Pap test in combination with an HPV test. Bone density scan. This is done to screen for osteoporosis. You may have this scan if you are at high risk for osteoporosis. Discuss your test results, treatment options, and if necessary, the need for more tests with your health care provider. Vaccines  Your health care  provider may recommend certain vaccines, such as: Influenza vaccine. This is recommended every year. Tetanus, diphtheria, and acellular  pertussis (Tdap, Td) vaccine. You may need a Td booster every 10 years. Zoster vaccine. You may need this after age 29. Pneumococcal 13-valent conjugate (PCV13) vaccine. You may need this if you have certain conditions and were not previously vaccinated. Pneumococcal polysaccharide (PPSV23) vaccine. You may need one or two doses if you smoke cigarettes or if you have certain conditions. Talk to your health care provider about which screenings and vaccines you need and how often you need them. This information is not intended to replace advice given to you by your health care provider. Make sure you discuss any questions you have with your health care provider. Document Released: 05/26/2015 Document Revised: 01/17/2016 Document Reviewed: 02/28/2015 Elsevier Interactive Patient Education  2017 [place] Prevention in the Home Falls can cause injuries. They can happen to people of all ages. There are many things you can do to make your home safe and to help prevent falls. What can I do on the outside of my home? Regularly fix the edges of walkways and driveways and fix any cracks. Remove anything that might make you trip as you walk through a door, such as a raised step or threshold. Trim any bushes or trees on the path to your home. Use bright outdoor lighting. Clear any walking paths of anything that might make someone trip, such as rocks or tools. Regularly check to see if handrails are loose or broken. Make sure that both sides of any steps have handrails. Any raised decks and porches should have guardrails on the edges. Have any leaves, snow, or ice cleared regularly. Use sand or salt on walking paths during winter. Clean up any spills in your garage right away. This includes oil or grease spills. What can I do in the bathroom? Use night lights. Install grab bars by the toilet and in the tub and shower. Do not use towel bars as grab bars. Use non-skid mats or decals in the  tub or shower. If you need to sit down in the shower, use a plastic, non-slip stool. Keep the floor dry. Clean up any water that spills on the floor as soon as it happens. Remove soap buildup in the tub or shower regularly. Attach bath mats securely with double-sided non-slip rug tape. Do not have throw rugs and other things on the floor that can make you trip. What can I do in the bedroom? Use night lights. Make sure that you have a light by your bed that is easy to reach. Do not use any sheets or blankets that are too big for your bed. They should not hang down onto the floor. Have a firm chair that has side arms. You can use this for support while you get dressed. Do not have throw rugs and other things on the floor that can make you trip. What can I do in the kitchen? Clean up any spills right away. Avoid walking on wet floors. Keep items that you use a lot in easy-to-reach places. If you need to reach something above you, use a strong step stool that has a grab bar. Keep electrical cords out of the way. Do not use floor polish or wax that makes floors slippery. If you must use wax, use non-skid floor wax. Do not have throw rugs and other things on the floor that can make you trip. What can I do  with my stairs? Do not leave any items on the stairs. Make sure that there are handrails on both sides of the stairs and use them. Fix handrails that are broken or loose. Make sure that handrails are as long as the stairways. Check any carpeting to make sure that it is firmly attached to the stairs. Fix any carpet that is loose or worn. Avoid having throw rugs at the top or bottom of the stairs. If you do have throw rugs, attach them to the floor with carpet tape. Make sure that you have a light switch at the top of the stairs and the bottom of the stairs. If you do not have them, ask someone to add them for you. What else can I do to help prevent falls? Wear shoes that: Do not have high  heels. Have rubber bottoms. Are comfortable and fit you well. Are closed at the toe. Do not wear sandals. If you use a stepladder: Make sure that it is fully opened. Do not climb a closed stepladder. Make sure that both sides of the stepladder are locked into place. Ask someone to hold it for you, if possible. Clearly mark and make sure that you can see: Any grab bars or handrails. First and last steps. Where the edge of each step is. Use tools that help you move around (mobility aids) if they are needed. These include: Canes. Walkers. Scooters. Crutches. Turn on the lights when you go into a dark area. Replace any light bulbs as soon as they burn out. Set up your furniture so you have a clear path. Avoid moving your furniture around. If any of your floors are uneven, fix them. If there are any pets around you, be aware of where they are. Review your medicines with your doctor. Some medicines can make you feel dizzy. This can increase your chance of falling. Ask your doctor what other things that you can do to help prevent falls. This information is not intended to replace advice given to you by your health care provider. Make sure you discuss any questions you have with your health care provider. Document Released: 02/23/2009 Document Revised: 10/05/2015 Document Reviewed: 06/03/2014 Elsevier Interactive Patient Education  2017 [employer].

## 2022-05-18 DIAGNOSIS — Z882 Allergy status to sulfonamides status: Secondary | ICD-10-CM | POA: Diagnosis not present

## 2022-05-18 DIAGNOSIS — R6889 Other general symptoms and signs: Secondary | ICD-10-CM | POA: Diagnosis not present

## 2022-05-18 DIAGNOSIS — M79622 Pain in left upper arm: Secondary | ICD-10-CM | POA: Diagnosis not present

## 2022-05-18 DIAGNOSIS — I10 Essential (primary) hypertension: Secondary | ICD-10-CM | POA: Diagnosis not present

## 2022-05-18 DIAGNOSIS — E162 Hypoglycemia, unspecified: Secondary | ICD-10-CM | POA: Diagnosis not present

## 2022-05-18 DIAGNOSIS — Z881 Allergy status to other antibiotic agents status: Secondary | ICD-10-CM | POA: Diagnosis not present

## 2022-05-18 DIAGNOSIS — Z7982 Long term (current) use of aspirin: Secondary | ICD-10-CM | POA: Diagnosis not present

## 2022-05-19 DIAGNOSIS — E162 Hypoglycemia, unspecified: Secondary | ICD-10-CM | POA: Diagnosis not present

## 2022-05-20 ENCOUNTER — Ambulatory Visit: Payer: Self-pay

## 2022-05-20 NOTE — Telephone Encounter (Signed)
  Chief Complaint: hypoglycemia Symptoms: BS 78 lowest today, feeling nervous and sick Frequency: ongoing since last Thursday 05/16/22 Pertinent Negatives: Patient denies nausea or vomiting Disposition: '[]'$ ED /'[]'$ Urgent Care (no appt availability in office) / '[x]'$ Appointment(In office/virtual)/ '[]'$  [facility]/ '[]'$ Home Care/ '[]'$ Refused Recommended Disposition /'[]'$  Mobile Bus/ '[]'$  Follow-up with PCP Additional Notes: pt states another provider increased her gabapentin and unsure if that causing BS to run lower but had it drop to 56 and pt called EMS, got it back up and then says keeps dropping. Went to ED on 05/18/22. Pt states still staying low. Scheduled FU appt for tomorrow at 1520. Advised pt of care advice for low BS and pt verbalized understanding.   Reason for Disposition  [1] Blood glucose 70  mg/dL (3.9 mmol/L) or below OR symptomatic, now improved with Care Advice AND [2] cause unknown  Answer Assessment - Initial Assessment Questions 2. ONSET:  "When did the symptoms start?"     Started Thursday 05/16/22 3. BLOOD GLUCOSE: "What is your blood glucose level?"       BS lowest 78,  4. USUAL RANGE: "What is your blood glucose level usually?" (e.g., usual fasting morning value, usual evening value)     124-130 5. TYPE 1 or 2:  "Do you know what type of diabetes you have?"  (e.g., Type 1, Type 2, Gestational; doesn't know)      DM 6. INSULIN: "Do you take insulin?" "What type of insulin(s) do you use? What is the mode of delivery? (syringe, pen; injection or pump) "When did you last give yourself an insulin dose?" (i.e., time or hours/minutes ago) "How much did you give?" (i.e., how many units)     Trulicity 7. DIABETES PILLS: "Do you take any pills for your diabetes?" If Yes, ask: "What is the name of the medicine(s) that you take for high blood sugar?"     metformin 8. OTHER SYMPTOMS: "Do you have any symptoms?" (e.g., fever, frequent urination, difficulty breathing, vomiting)      Feeling nervous and feel sick 9. LOW BLOOD GLUCOSE TREATMENT: "What have you done so far to treat the low blood glucose level?"     Drank juice and ate piece of candy 10. FOOD: "When did you last eat or drink?"       Lunch time  Protocols used: Diabetes - Low Blood Sugar-A-AH

## 2022-05-21 ENCOUNTER — Inpatient Hospital Stay: Payer: Medicare Other | Admitting: Family Medicine

## 2022-05-21 NOTE — Progress Notes (Unsigned)
Established patient visit  Patient: Emily Stokes   DOB: 05-13-1961   62 y.o. Female  MRN: [id number] Visit Date: 05/22/2022  Today's healthcare provider: Gwyneth Sprout, FNP  Introduced to nurse practitioner role and practice setting.  All questions answered.  Discussed provider/patient relationship and expectations.  Chief Complaint  Patient presents with   Hypoglycemia    Follow up from the ED. Patient said her blood sugar is dropping 4-5 times daily.    Subjective    HPI HPI     Hypoglycemia    Additional comments: Follow up from the ED. Patient said her blood sugar is dropping 4-5 times daily.       Last edited by Clista Bernhardt, CMA on 05/22/2022  3:51 PM.      Follow up ER visit  Patient was seen in ER for Hypoglycemia on 05/18/2021. Treatment for this included none. Labs and EKG normal. She reports fair compliance with treatment. She reports this condition is Unchanged.  -----------------------------------------------------------------------------------------   Medications: Outpatient Medications Prior to Visit  Medication Sig   acetaminophen (TYLENOL) 500 MG tablet Take 500 mg by mouth every 6 (six) hours as needed.   aspirin EC (ASPIRIN LOW DOSE) 81 MG tablet TAKE ONE TABLET BY MOUTH ONCE DAILY SWALLOW whole   atorvastatin (LIPITOR) 20 MG tablet TAKE ONE TABLET BY MOUTH ONCE DAILY   gabapentin (NEURONTIN) 400 MG capsule Take 1 capsule (400 mg total) by mouth 3 (three) times daily.   glucose blood (ONETOUCH ULTRA) test strip Use to monitor blood sugars three times daily as directed   Ibuprofen-diphenhydrAMINE Cit (IBUPROFEN PM PO) Take by mouth at bedtime as needed.   Lancet Devices (EASY MINI EJECT LANCING DEVICE) MISC    Lancets (ONETOUCH ULTRASOFT) lancets Use to monitor blood sugars three times daily as directed   metFORMIN (GLUCOPHAGE) 500 MG tablet Take 2 tablets (1,000 mg total) by mouth 2 (two) times daily with a meal.   nitrofurantoin,  macrocrystal-monohydrate, (MACROBID) 100 MG capsule Take 1 capsule (100 mg total) by mouth daily.   oxybutynin (DITROPAN XL) 10 MG 24 hr tablet Take 1 tablet (10 mg total) by mouth at bedtime.   telmisartan (MICARDIS) 20 MG tablet TAKE ONE TABLET BY MOUTH ONCE DAILY   TRULICITY 3 LD/3.5TS SOPN Inject 3 mg as directed once a week.   [DISCONTINUED] amLODipine (NORVASC) 5 MG tablet TAKE ONE TABLET BY MOUTH EVERY MORNING   [DISCONTINUED] amoxicillin-clavulanate (AUGMENTIN) 875-125 MG tablet Take 1 tablet by mouth 2 (two) times daily.   No facility-administered medications prior to visit.   Review of Systems    Objective    BP 107/67   Pulse (!) 102   Ht '5\' 9"'$  (1.753 m)   Wt 197 lb (89.4 kg)   SpO2 97%   BMI 29.09 kg/m   Physical Exam Vitals and nursing note reviewed.  Constitutional:      General: She is not in acute distress.    Appearance: Normal appearance. She is obese. She is not ill-appearing, toxic-appearing or diaphoretic.  HENT:     Head: Normocephalic and atraumatic.  Cardiovascular:     Rate and Rhythm: Regular rhythm. Tachycardia present.     Pulses: Normal pulses.     Heart sounds: Normal heart sounds. No murmur heard.    No friction rub. No gallop.  Pulmonary:     Effort: Pulmonary effort is normal. No respiratory distress.     Breath sounds: Normal breath sounds. No stridor. No wheezing,  rhonchi or rales.  Chest:     Chest wall: No tenderness.  Musculoskeletal:        General: No swelling, tenderness, deformity or signs of injury. Normal range of motion.     Right lower leg: No edema.     Left lower leg: No edema.  Skin:    General: Skin is warm and dry.     Capillary Refill: Capillary refill takes less than 2 seconds.     Coloration: Skin is not jaundiced or pale.     Findings: No bruising, erythema, lesion or rash.  Neurological:     General: No focal deficit present.     Mental Status: She is alert and oriented to person, place, and time. Mental status  is at baseline.     Cranial Nerves: No cranial nerve deficit.     Sensory: No sensory deficit.     Motor: No weakness.     Coordination: Coordination normal.  Psychiatric:        Mood and Affect: Mood normal.        Behavior: Behavior normal.        Thought Content: Thought content normal.        Judgment: Judgment normal.     No results found for any visits on 05/22/22.  Assessment & Plan     Problem List Items Addressed This Visit       Cardiovascular and Mediastinum   Symptomatic PVCs - Primary    Recommend zio patch given normal CBC and CMP previously; slightly elevated HR Continue to recommend regular meals and hydration to assist       Relevant Orders   LONG TERM MONITOR (3-14 DAYS)     Endocrine   Type 2 diabetes mellitus with hyperglycemia, without long-term current use of insulin (HCC)    Reports labile blood sugars; down to 50s up to 5 days a day Recommend referral to endocrine to assist further Repeat labs to assist       Relevant Orders   CBC   Basic Metabolic Panel (BMET)   Hemoglobin A1c   Ambulatory referral to Endocrinology     Genitourinary   Stage 3a chronic kidney disease ([place])    Chronic, previously improved Continue to monitor       Relevant Orders   CBC   Basic Metabolic Panel (BMET)   Ambulatory referral to Endocrinology     Other   Blood glucose labile    Currently on metformin 6979 mg BID and trulicity 3 mg       Relevant Orders   Hemoglobin A1c   Ambulatory referral to Endocrinology   Return if symptoms worsen or fail to improve.     [name], FNP, have reviewed all documentation for this visit. The documentation on 05/22/22 for the exam, diagnosis, procedures, and orders are all accurate and complete.  Gwyneth Sprout, [place] [phone] (phone) [phone] (fax)  [place]

## 2022-05-22 ENCOUNTER — Ambulatory Visit: Payer: Medicare HMO | Attending: Family Medicine

## 2022-05-22 ENCOUNTER — Ambulatory Visit (INDEPENDENT_AMBULATORY_CARE_PROVIDER_SITE_OTHER): Payer: Medicare Other | Admitting: Family Medicine

## 2022-05-22 ENCOUNTER — Encounter: Payer: Self-pay | Admitting: Family Medicine

## 2022-05-22 VITALS — BP 107/67 | HR 102 | Ht 69.0 in | Wt 197.0 lb

## 2022-05-22 DIAGNOSIS — N1831 Chronic kidney disease, stage 3a: Secondary | ICD-10-CM

## 2022-05-22 DIAGNOSIS — E1165 Type 2 diabetes mellitus with hyperglycemia: Secondary | ICD-10-CM | POA: Diagnosis not present

## 2022-05-22 DIAGNOSIS — I493 Ventricular premature depolarization: Secondary | ICD-10-CM

## 2022-05-22 DIAGNOSIS — R7309 Other abnormal glucose: Secondary | ICD-10-CM | POA: Insufficient documentation

## 2022-05-22 NOTE — Assessment & Plan Note (Signed)
Currently on metformin 2947 mg BID and trulicity 3 mg

## 2022-05-22 NOTE — Assessment & Plan Note (Signed)
Recommend zio patch given normal CBC and CMP previously; slightly elevated HR Continue to recommend regular meals and hydration to assist

## 2022-05-22 NOTE — Assessment & Plan Note (Signed)
Reports labile blood sugars; down to 50s up to 5 days a day Recommend referral to endocrine to assist further Repeat labs to assist

## 2022-05-22 NOTE — Assessment & Plan Note (Signed)
Chronic, previously improved Continue to monitor

## 2022-05-23 ENCOUNTER — Telehealth: Payer: Self-pay

## 2022-05-23 LAB — BASIC METABOLIC PANEL
BUN/Creatinine Ratio: 15 (ref 12–28)
BUN: 18 mg/dL (ref 8–27)
CO2: 26 mmol/L (ref 20–29)
Calcium: 10.1 mg/dL (ref 8.7–10.3)
Chloride: 101 mmol/L (ref 96–106)
Creatinine, Ser: 1.2 mg/dL — ABNORMAL HIGH (ref 0.57–1.00)
Glucose: 129 mg/dL — ABNORMAL HIGH (ref 70–99)
Potassium: 4.3 mmol/L (ref 3.5–5.2)
Sodium: 141 mmol/L (ref 134–144)
eGFR: 51 mL/min/{1.73_m2} — ABNORMAL LOW (ref >59)

## 2022-05-23 LAB — CBC
Hematocrit: 36.6 % (ref 34.0–46.6)
Hemoglobin: 12.6 g/dL (ref 11.1–15.9)
MCH: 32.1 pg (ref 26.6–33.0)
MCHC: 34.4 g/dL (ref 31.5–35.7)
MCV: 93 fL (ref 79–97)
Platelets: 354 10*3/uL (ref 150–450)
RBC: 3.93 x10E6/uL (ref 3.77–5.28)
RDW: 12.1 % (ref 11.7–15.4)
WBC: 7.3 10*3/uL (ref 3.4–10.8)

## 2022-05-23 LAB — HEMOGLOBIN A1C
Est. average glucose Bld gHb Est-mCnc: 126 mg/dL
Hgb A1c MFr Bld: 6 % — ABNORMAL HIGH (ref 4.8–5.6)

## 2022-05-23 NOTE — Progress Notes (Signed)
Care Management & Coordination Services Pharmacy Team  Reason for Encounter: Medication coordination and delivery  Contacted patient on 05/23/2022 to discuss medications   Recent office visits:  05/22/2022 [name] FNP (PCP)No Medication Changes noted, Ambulatory referral to Endocrinology  05/16/2022 Kirke Shaggy LPN (PCP Office) No Medication Changes noted, Medicare wellness completed  Recent consult visits:  05/09/2022 Dr. Holley Raring MD (Pain Medicine) Increase Gabapentin to 400 mg 3 times daily 05/01/2022 Dr. Matilde Sprang MD (Urology) Stop Ciprofloxacin , Start Augmentin 875-125 mg 2 times daily 04/29/2022 Dr. Matilde Sprang MD (Urology) Start Ciprofloxacin 250 mg twice a day for 7 days, then Start nitrofurantoin 100 mg 3 x 11, follow up in 6 - 7 weeks  Hospital visits:  Medication Reconciliation was completed by comparing discharge summary, patient's EMR and Pharmacy list, and upon discussion with patient.  Admitted to the hospital on 05/18/2022 due to Hypoglycemia. Discharge date was 05/18/2022. Discharged from [facility]?Medications Started at [facility] Discharge:?? -started None ID  Medication Changes at Hospital Discharge: -Changed None ID  Medications Discontinued at Hospital Discharge: -Stopped None ID  Medications that remain the same after Hospital Discharge:??  -All other medications will remain the same.    Medications: Outpatient Encounter Medications as of 05/23/2022  Medication Sig   acetaminophen (TYLENOL) 500 MG tablet Take 500 mg by mouth every 6 (six) hours as needed.   aspirin EC (ASPIRIN LOW DOSE) 81 MG tablet TAKE ONE TABLET BY MOUTH ONCE DAILY SWALLOW whole   atorvastatin (LIPITOR) 20 MG tablet TAKE ONE TABLET BY MOUTH ONCE DAILY   gabapentin (NEURONTIN) 400 MG capsule Take 1 capsule (400 mg total) by mouth 3 (three) times daily.   glucose blood (ONETOUCH ULTRA) test strip Use to monitor blood sugars three times daily as directed    Ibuprofen-diphenhydrAMINE Cit (IBUPROFEN PM PO) Take by mouth at bedtime as needed.   Lancet Devices (EASY MINI EJECT LANCING DEVICE) MISC    Lancets (ONETOUCH ULTRASOFT) lancets Use to monitor blood sugars three times daily as directed   metFORMIN (GLUCOPHAGE) 500 MG tablet Take 2 tablets (1,000 mg total) by mouth 2 (two) times daily with a meal.   nitrofurantoin, macrocrystal-monohydrate, (MACROBID) 100 MG capsule Take 1 capsule (100 mg total) by mouth daily.   oxybutynin (DITROPAN XL) 10 MG 24 hr tablet Take 1 tablet (10 mg total) by mouth at bedtime.   telmisartan (MICARDIS) 20 MG tablet TAKE ONE TABLET BY MOUTH ONCE DAILY   TRULICITY 3 ZO/1.0RU SOPN Inject 3 mg as directed once a week.   No facility-administered encounter medications on file as of 05/23/2022.   BP Readings from Last 3 Encounters:  05/22/22 107/67  05/09/22 129/67  04/29/22 133/80    Pulse Readings from Last 3 Encounters:  05/22/22 (!) 102  05/09/22 97  04/29/22 (!) 102    Lab Results  Component Value Date/Time   HGBA1C 6.0 (H) 05/22/2022 04:14 PM   HGBA1C 6.0 (H) 02/08/2022 09:08 AM   Lab Results  Component Value Date   CREATININE 1.20 (H) 05/22/2022   BUN 18 05/22/2022   GFRNONAA 55 (L) 06/06/2020   GFRAA 63 06/06/2020   NA 141 05/22/2022   K 4.3 05/22/2022   CALCIUM 10.1 05/22/2022   CO2 26 05/22/2022   Care Gaps: Dtap/Tdap Medicare Annual Wellness Diabetic Eye Exam COVID-19 Vaccine   Star Rating Drugs: Atorvastatin 20 mg last filled on 04/30/2022 for a 30-Day supply with [facility] Metformin 500 mg last filled on 04/30/2022 for  a 30-Day supply with [facility] Telmisartan 20 mg last filled on 04/30/2022 for a 23-Day supply with [facility] Trulicity 3 mg last filled on 04/30/2022 for a 28-Day supply with [facility]  Last adherence delivery date:05/03/2022      Patient is due for next adherence delivery on: 06/04/2022  Multiple attempts made to reach patient.  Unsuccessful outreach. Will refill based off of last adherence fill.   This delivery to include: Adherence Packaging  30 Days  Metformin 500 mg 2 tablets twice daily (Breakfast, Bedtime) Amlodipine 5 mg 1 tablet daily (Breakfast) Atorvastatin 20 mg 1 tablet daily (Breakfast) Aspirin 81 mg 1 tablet daily (Breakfast) Trulicity Inject 3 mg into the skin once a week Oxybutynin 10 mg 1 tablet daily (Bedtime) Telmisartan 20 mg 1 tablet daily  (Breakfast) OneTouch Test Strips OneTouch Lancets  Patient declined the following medications this month: Unsuccessful outreach, medication that are decline will be based off of last adherence call.  Gabapentin 400 mg   Refills requested from providers include: Metformin prescribe by PCP.  Delivery scheduled for 06/04/2022. Unable to speak with patient to confirm date.    Is patient in packaging Yes  Cycle dispensing form sent to Daron Offer for review.  [place] Pharmacist Assistant [phone]

## 2022-05-23 NOTE — Progress Notes (Signed)
Normal blood glucose levels as well as stable A1c and blood counts. Unclear why you have been feeling "low blood sugars." Kidney function remains stable. Continue to recommend regular, balanced, lower carb meals. Smaller meal size, adding snacks. Choosing water as drink of choice and increasing purposeful exercise.

## 2022-05-25 DIAGNOSIS — I493 Ventricular premature depolarization: Secondary | ICD-10-CM | POA: Diagnosis not present

## 2022-06-10 ENCOUNTER — Other Ambulatory Visit: Payer: Medicare Other | Admitting: Urology

## 2022-06-18 DIAGNOSIS — I493 Ventricular premature depolarization: Secondary | ICD-10-CM | POA: Diagnosis not present

## 2022-06-21 NOTE — Progress Notes (Signed)
Your Zio/heart monitor has returned noting one period of elevated ventricular heart rate with average rate of 138 beats/minute. A normal sinus rhythm has a heart rate between 60-100 beats/minute. When the device was triggered, it correlated to a normal SR however, you were noting PVCs or early ventricular beats from the ventricle. These beats are normal and benign when they are not sustained. They can occur from caffeine, dehydration, periods of acute stress or poor sleep quality. Please let us know if you have any questions.  Thank you, Gwyneth Sprout, [address] [place], [place] [address] [phone] (phone) [phone] (fax) [employer]

## 2022-06-24 ENCOUNTER — Telehealth: Payer: Self-pay

## 2022-06-24 NOTE — Progress Notes (Signed)
Care Management & Coordination Services Pharmacy Team Pharmacy Assistant   Name: Emily Stokes  MRN: [id number] DOB: March 09, 1961  Reason for Encounter: Medication Coordination and Delivery for [facility]  Contacted patient to discuss medications and coordinate delivery from [facility]. Spoke with patient on 06/24/2022    Chart review: Recent office visits:  None ID  Recent consult visits:  None ID  Hospital visits:  None in previous 6 months  Medications: Outpatient Encounter Medications as of 06/24/2022  Medication Sig   acetaminophen (TYLENOL) 500 MG tablet Take 500 mg by mouth every 6 (six) hours as needed.   aspirin EC (ASPIRIN LOW DOSE) 81 MG tablet TAKE ONE TABLET BY MOUTH ONCE DAILY SWALLOW whole   atorvastatin (LIPITOR) 20 MG tablet TAKE ONE TABLET BY MOUTH ONCE DAILY   gabapentin (NEURONTIN) 400 MG capsule Take 1 capsule (400 mg total) by mouth 3 (three) times daily.   glucose blood (ONETOUCH ULTRA) test strip Use to monitor blood sugars three times daily as directed   Ibuprofen-diphenhydrAMINE Cit (IBUPROFEN PM PO) Take by mouth at bedtime as needed.   Lancet Devices (EASY MINI EJECT LANCING DEVICE) MISC    Lancets (ONETOUCH ULTRASOFT) lancets Use to monitor blood sugars three times daily as directed   metFORMIN (GLUCOPHAGE) 500 MG tablet Take 2 tablets (1,000 mg total) by mouth 2 (two) times daily with a meal.   nitrofurantoin, macrocrystal-monohydrate, (MACROBID) 100 MG capsule Take 1 capsule (100 mg total) by mouth daily.   oxybutynin (DITROPAN XL) 10 MG 24 hr tablet Take 1 tablet (10 mg total) by mouth at bedtime.   telmisartan (MICARDIS) 20 MG tablet TAKE ONE TABLET BY MOUTH ONCE DAILY   TRULICITY 3 0000000 SOPN Inject 3 mg as directed once a week.   No facility-administered encounter medications on file as of 06/24/2022.   BP Readings from Last 3 Encounters:  05/22/22 107/67  05/09/22 129/67  04/29/22 133/80    Pulse Readings from Last 3  Encounters:  05/22/22 (!) 102  05/09/22 97  04/29/22 (!) 102    Lab Results  Component Value Date/Time   HGBA1C 6.0 (H) 05/22/2022 04:14 PM   HGBA1C 6.0 (H) 02/08/2022 09:08 AM   Lab Results  Component Value Date   CREATININE 1.20 (H) 05/22/2022   BUN 18 05/22/2022   GFRNONAA 55 (L) 06/06/2020   GFRAA 63 06/06/2020   NA 141 05/22/2022   K 4.3 05/22/2022   CALCIUM 10.1 05/22/2022   CO2 26 05/22/2022   Cycle dispensing form sent to Junius Argyle, CPP for review.   Last adherence delivery date: 06/04/2022      Patient is due for next adherence delivery on: 07/04/2022 2nd Route  This delivery to include: Adherence Packaging  30 Days  Metformin 500 mg 2 tablets twice daily (Breakfast, Bedtime) Amlodipine 5 mg 1 tablet daily (Breakfast) Atorvastatin 20 mg 1 tablet daily (Breakfast) Aspirin 81 mg 1 tablet daily (Breakfast) Trulicity Inject 3 mg into the skin once a week Oxybutynin 10 mg 1 tablet daily (Bedtime) Telmisartan 20 mg 1 tablet daily  (Breakfast) OneTouch Test Strips OneTouch Lancets  Patient declined the following medications this month: None ID  Refills requested from providers include: Aspirin 81 mg  Confirmed delivery date of 07/04/2022 2nd Route, advised patient that pharmacy will contact them the morning of delivery.  Any concerns about your medications? No  How often do you forget or accidentally miss a dose? Never  Do you use a pillbox? No  Is patient in  packaging Yes  Patient has a follow-up appointment with Junius Argyle, CPP on 06/28/2022 @ [place], [place] Pharmacist Assistant Phone: [phone]

## 2022-06-27 ENCOUNTER — Other Ambulatory Visit: Payer: Self-pay | Admitting: Family Medicine

## 2022-06-27 DIAGNOSIS — E119 Type 2 diabetes mellitus without complications: Secondary | ICD-10-CM

## 2022-06-27 NOTE — Telephone Encounter (Signed)
Requested Prescriptions  Pending Prescriptions Disp Refills   aspirin EC (ASPIRIN LOW DOSE) 81 MG tablet [Pharmacy Med Name: aspirin 81 mg tablet,delayed release] 90 tablet 3    Sig: TAKE ONE TABLET BY MOUTH ONCE DAILY swallow whole     Analgesics:  NSAIDS - aspirin Failed - 06/27/2022  4:01 PM      Failed - Cr in normal range and within 360 days    Creatinine  Date Value Ref Range Status  03/25/2014 1.16 0.60 - 1.30 mg/dL Final   Creatinine, Ser  Date Value Ref Range Status  05/22/2022 1.20 (H) 0.57 - 1.00 mg/dL Final         Passed - eGFR is 10 or above and within 360 days    EGFR (African American)  Date Value Ref Range Status  03/25/2014 >60 >37m/min Final   GFR calc Af Amer  Date Value Ref Range Status  06/06/2020 63 >59 mL/min/1.73 Final    Comment:    **In accordance with recommendations from the NKF-ASN Task force,**   [facility] is in the process of updating its eGFR calculation to the   2021 CKD-EPI creatinine equation that estimates kidney function   without a race variable.    EGFR (Non-African Amer.)  Date Value Ref Range Status  03/25/2014 52 (L) >63mmin Final    Comment:    eGFR values <60101min/1.73 m2 may be an indication of chronic kidney disease (CKD). Calculated eGFR, using the MRDR Study equation, is useful in  patients with stable renal function. The eGFR calculation will not be reliable in acutely ill patients when serum creatinine is changing rapidly. It is not useful in patients on dialysis. The eGFR calculation may not be applicable to patients at the low and high extremes of body sizes, pregnant women, and vegetarians.    GFR calc non Af Amer  Date Value Ref Range Status  06/06/2020 55 (L) >59 mL/min/1.73 Final   eGFR  Date Value Ref Range Status  05/22/2022 51 (L) >59 mL/min/1.73 Final         Passed - Patient is not pregnant      Passed - Valid encounter within last 12 months    Recent Outpatient Visits           1 month ago  Type 2 diabetes mellitus with hyperglycemia, without long-term current use of insulin [facility]   4 months ago Annual physical exam   [facility]   5 months ago No-show for appointment   [facility]   6 months ago [facility] Joe FNP   8 months ago Type 2 diabetes mellitus with hyperglycemia, without long-term current use of insulin ([facility] Sprout[facility]       Future Appointments             In 1 month PayRollene RotundaliJaci StandardNPKettle FallsEC[facility]

## 2022-06-28 ENCOUNTER — Ambulatory Visit: Admission: RE | Admit: 2022-06-28 | Payer: Medicare Other | Source: Ambulatory Visit

## 2022-06-28 ENCOUNTER — Telehealth: Payer: Self-pay | Admitting: Family Medicine

## 2022-06-28 ENCOUNTER — Ambulatory Visit: Payer: Medicare Other

## 2022-06-28 DIAGNOSIS — E119 Type 2 diabetes mellitus without complications: Secondary | ICD-10-CM

## 2022-06-28 DIAGNOSIS — E1159 Type 2 diabetes mellitus with other circulatory complications: Secondary | ICD-10-CM

## 2022-06-28 DIAGNOSIS — E11649 Type 2 diabetes mellitus with hypoglycemia without coma: Secondary | ICD-10-CM

## 2022-06-28 MED ORDER — METFORMIN HCL 500 MG PO TABS: 500.0000 mg | ORAL_TABLET | Freq: Two times a day (BID) | ORAL | 3 refills | Status: DC

## 2022-06-28 NOTE — Progress Notes (Signed)
Care Management & Coordination Services Pharmacy Note  06/28/2022 Name:  Emily Stokes MRN:  [id number] DOB:  04-29-1961  Summary: Patient presents for follow-up consult.   -Reports hypoglycemic symptoms. Patient reports feeling sick, nervous. Blood sugar had dropped down to 50-55s multiple times in January. Patient treats with orange juice or candy.    -Amlodipine appears to have been discontinued at last PCP follow-up, but patient continues to take the medication daily. Does not monitor blood pressure at home. Patient denies symptoms of hypotension.   Recommendations/Changes made from today's visit: -DECREASE Metformin XR to 500 mg twice daily   Follow up plan: CPP follow-up 1 month    Subjective: Emily Stokes is an 62 y.o. year old female who is a primary patient of Gwyneth Sprout, FNP.  The care coordination team was consulted for assistance with disease management and care coordination needs.    Engaged with patient by telephone for follow up visit.  Recent office visits: 05/22/22: Patient presented to [name], FNP for follow-up. A1c 6.0%.   Recent consult visits: 05/09/22: Patient presented to Dr. Holley Raring (Pain Mgmt) for follow-up.  04/29/22: Patient presented to Dr. Matilde Sprang (Urology) for follow-up.   Hospital visits: 05/18/22: Patient presented to ED for hypoglycemia   Objective:  Lab Results  Component Value Date   CREATININE 1.20 (H) 05/22/2022   BUN 18 05/22/2022   EGFR 51 (L) 05/22/2022   GFRNONAA 55 (L) 06/06/2020   GFRAA 63 06/06/2020   NA 141 05/22/2022   K 4.3 05/22/2022   CALCIUM 10.1 05/22/2022   CO2 26 05/22/2022   GLUCOSE 129 (H) 05/22/2022    Lab Results  Component Value Date/Time   HGBA1C 6.0 (H) 05/22/2022 04:14 PM   HGBA1C 6.0 (H) 02/08/2022 09:08 AM   MICROALBUR 50 06/06/2020 10:48 AM   MICROALBUR 20 01/30/2018 04:54 PM    Last diabetic Eye exam:  Lab Results  Component Value Date/Time   HMDIABEYEEXA No Retinopathy 09/15/2019  12:00 AM    Last diabetic Foot exam: No results found for: "HMDIABFOOTEX"   Lab Results  Component Value Date   CHOL 126 02/08/2022   HDL 28 (L) 02/08/2022   LDLCALC 83 02/08/2022   TRIG 73 02/08/2022   CHOLHDL 4.5 (H) 02/08/2022       Latest Ref Rng & Units 02/08/2022    9:08 AM 07/05/2021    9:29 AM 06/06/2020   10:44 AM  Hepatic Function  Total Protein 6.0 - 8.5 g/dL 7.5  7.6  7.4   Albumin 3.9 - 4.9 g/dL 4.4  4.1  4.0   AST 0 - 40 IU/L 12  15  31   $ ALT 0 - 32 IU/L 9  13  25   $ Alk Phosphatase 44 - 121 IU/L 95  103  116   Total Bilirubin 0.0 - 1.2 mg/dL 0.4  0.4  0.4     Lab Results  Component Value Date/Time   TSH 1.900 02/08/2022 09:08 AM   TSH 1.500 07/28/2019 08:25 AM   FREET4 1.37 02/08/2022 09:08 AM       Latest Ref Rng & Units 05/22/2022    4:14 PM 02/08/2022    9:08 AM 07/28/2019    8:25 AM  CBC  WBC 3.4 - 10.8 x10E3/uL 7.3  4.8  6.0   Hemoglobin 11.1 - 15.9 g/dL 12.6  11.2  11.7   Hematocrit 34.0 - 46.6 % 36.6  34.7  35.1   Platelets 150 - 450 x10E3/uL 354  303  374     No results found for: "VD25OH", "VITAMINB12"  Clinical ASCVD: No  The ASCVD Risk score (Arnett DK, et al., 2019) failed to calculate for the following reasons:   The valid total cholesterol range is 130 to 320 mg/dL       05/22/2022    3:53 PM 05/16/2022   10:06 AM 02/08/2022    8:46 AM  Depression screen PHQ 2/9  Decreased Interest  0 0  Down, Depressed, Hopeless 0 0 0  PHQ - 2 Score 0 0 0  Altered sleeping 1 0 1  Tired, decreased energy 1 0 0  Change in appetite 0 0 0  Feeling bad or failure about yourself  0 0 0  Trouble concentrating 0 0 0  Moving slowly or fidgety/restless 0 0 0  Suicidal thoughts 0 0 0  PHQ-9 Score 2 0 1  Difficult doing work/chores Not difficult at all Not difficult at all Not difficult at all     Social History   Tobacco Use  Smoking Status Never  Smokeless Tobacco Never   BP Readings from Last 3 Encounters:  05/22/22 107/67  05/09/22 129/67   04/29/22 133/80   Pulse Readings from Last 3 Encounters:  05/22/22 (!) 102  05/09/22 97  04/29/22 (!) 102   Wt Readings from Last 3 Encounters:  05/22/22 197 lb (89.4 kg)  05/16/22 205 lb (93 kg)  05/09/22 205 lb (93 kg)   BMI Readings from Last 3 Encounters:  05/22/22 29.09 kg/m  05/16/22 30.27 kg/m  05/09/22 30.27 kg/m    Allergies  Allergen Reactions   Sulfamethoxazole-Trimethoprim Swelling and Hives   Ciprofloxacin Itching and Rash    Swollen around eyes and mouth.     Medications Reviewed Today     Reviewed by Gwyneth Sprout, FNP (Family Nurse Practitioner) on 05/22/22 at 1650  Med List Status: <None>   Medication Order Taking? Sig Documenting Provider Last Dose Status Informant  acetaminophen (TYLENOL) 500 MG tablet [id number] Yes Take 500 mg by mouth every 6 (six) hours as needed. [provider] Taking Active   aspirin EC (ASPIRIN LOW DOSE) 81 MG tablet [id number] Yes TAKE ONE TABLET BY MOUTH ONCE DAILY SWALLOW whole Gwyneth Sprout, FNP Taking Active   atorvastatin (LIPITOR) 20 MG tablet [id number] Yes TAKE ONE TABLET BY MOUTH ONCE DAILY Gwyneth Sprout, FNP Taking Active   gabapentin (NEURONTIN) 400 MG capsule [id number] Yes Take 1 capsule (400 mg total) by mouth 3 (three) times daily. Gillis Santa, MD Taking Active   glucose blood (ONETOUCH ULTRA) test strip [id number] Yes Use to monitor blood sugars three times daily as directed [name] T, FNP Taking Active   Ibuprofen-diphenhydrAMINE Cit (IBUPROFEN PM PO) [id number] Yes Take by mouth at bedtime as needed. [provider] Taking Active   Lancet Devices (EASY MINI EJECT LANCING DEVICE) MISC [id number] Yes  [provider] Taking Active   Lancets Glory Rosebush ULTRASOFT) lancets [id number] Yes Use to monitor blood sugars three times daily as directed Gwyneth Sprout, FNP Taking Active   metFORMIN (GLUCOPHAGE) 500 MG tablet [id number] Yes Take 2 tablets (1,000 mg total) by mouth 2 (two) times daily  with a meal. Gwyneth Sprout, FNP Taking Active   nitrofurantoin, macrocrystal-monohydrate, (MACROBID) 100 MG capsule [id number] Yes Take 1 capsule (100 mg total) by mouth daily. Bjorn Loser, MD Taking Active   oxybutynin (DITROPAN XL) 10 MG 24 hr tablet [id number] Yes Take 1 tablet (10 mg total) by mouth at bedtime.  Gwyneth Sprout, FNP Taking Active   telmisartan (MICARDIS) 20 MG tablet [id number] Yes TAKE ONE TABLET BY MOUTH ONCE DAILY Gwyneth Sprout, FNP Taking Active   TRULICITY 3 0000000 SOPN [id number] Yes Inject 3 mg as directed once a week. Gwyneth Sprout, FNP Taking Active   Med List Note Landis Martins, [place] 07/13/20 [id number]): UDS 10-21-19 MR 09/11/2020 Opiod contracts signed 02/12/18 07-13-20 PA request for Tramadol submitted. Key [id number]  dw            SDOH:  (Social Determinants of Health) assessments and interventions performed: Yes SDOH Interventions    Flowsheet Row Office Visit from 05/22/2022 in [facility] from 05/16/2022 in [facility] Coordination from 12/14/2021 in [facility] Chronic Care Management from 07/06/2021 in [facility] Management from 06/05/2021 in [facility] from 05/09/2021 in [place] [facility]  SDOH Interventions        Food Insecurity Interventions -- Intervention Not Indicated Intervention Not Indicated -- -- Intervention Not Indicated  Housing Interventions -- Intervention Not Indicated -- -- -- Intervention Not Indicated  Transportation Interventions -- Intervention Not Indicated Intervention Not Indicated -- -- Intervention Not Indicated  Utilities Interventions -- Intervention Not Indicated -- -- -- --  Alcohol Usage Interventions -- Intervention Not Indicated (Score <7) -- -- -- --  Depression Interventions/Treatment  Counseling -- -- -- --  --  Financial Strain Interventions -- Intervention Not Indicated -- Other (Comment)  [PAP] Other (Comment)  [PAP] Intervention Not Indicated  Physical Activity Interventions -- Intervention Not Indicated -- -- -- Patient Refused  Stress Interventions -- Intervention Not Indicated -- -- -- Intervention Not Indicated  Social Connections Interventions -- Intervention Not Indicated -- -- -- Intervention Not Indicated       Medication Assistance: None required.  Patient affirms current coverage meets needs.  Medication Access: Within the past 30 days, how often has patient missed a dose of medication? None Is a pillbox or other method used to improve adherence? Yes   [facility] Reviewed: Is patient disadvantaged to use [facility]?: No  Current Rx insurance plan: [facility] Name and location of Current pharmacy:  [place], [place] [address] Phone: [phone] Fax: [phone]  [id number]: [phone]: [phone] [facility] [place] [address] [address] [address] [place]Phone: [phone]: [phone] Compliance/Adherence/Medication fill history: Care Gaps: Tdap  Shingrix  Covid  UACR   Star-Rating Drugs: Atorvastatin 20 mg: Last filled 05/31/22 for 90-DS Telmisartan 20 mg: Last filled 05/31/22 for 90-DS   Assessment/Plan  Hypertension (BP goal <130/80) -Controlled -Current treatment: Amlodipine 5 mg daily  Telmisartan 20 mg daily  -Medications previously tried: Amlodipine, Metoprolol  -Amlodipine appears to have been discontinued at last PCP follow-up, but patient continues to take the medication daily. Does not monitor blood pressure at home. Patient denies symptoms of hypotension.  -Current home  readings: Does not monitor at home -Denies hypotensive/hypertensive symptoms -Recommended to continue current medication  Hyperlipidemia: (LDL goal < 70) -Uncontrolled, but improved.  -Current treatment: Atorvastatin 20 mg daily  -Current treatment: Aspirin 81 mg daily  -Medications previously tried: NA  -Patient close to goal,  but LDL still slightly elevated.   -Continue current medications  Diabetes (A1c goal <7%) -Controlled.  -Diagnosed 2018 -Current medications: Metformin 1000 mg 2 tablets twice daily: Appropriate, Effective, Safe, Accessible Trulicity 3 mg weekly on tuesdays: Appropriate, Effective, Safe, Accessible  -Medications previously tried: NA  -Current home glucose readings:  Fasting: 120,  Afternoon: 79, 81   -Reports hypoglycemic symptoms. Patient reports feeling sick, nervous. Blood sugar had dropped down to 50-55s multiple times in January. Patient treats with orange juice or candy.   -Current meal patterns: Significantly reduced appetite since starting Trulicity. Salads 2-3x weekly. Drinks mainly water. Snacks peanuts.  -Current exercise: Unable walk due to leg pain.  -Patient continues to struggle with UTIs. Has an ultrasound scheduled for today. Defer discussion of SGLT2 inhibitor until symptoms improve.  -DECREASE Metformin XR to 500 mg twice daily   Junius Argyle, PharmD, [place], CPP  Clinical Pharmacist Practitioner  [facility] [phone]

## 2022-07-04 ENCOUNTER — Ambulatory Visit
Admission: RE | Admit: 2022-07-04 | Discharge: 2022-07-04 | Disposition: A | Discharge status: Home / Self Care | Payer: Medicare Other | Source: Ambulatory Visit | Attending: Urology | Admitting: Urology

## 2022-07-04 DIAGNOSIS — N281 Cyst of kidney, acquired: Secondary | ICD-10-CM | POA: Diagnosis not present

## 2022-07-04 DIAGNOSIS — N390 Urinary tract infection, site not specified: Secondary | ICD-10-CM | POA: Diagnosis not present

## 2022-07-04 DIAGNOSIS — R319 Hematuria, unspecified: Secondary | ICD-10-CM | POA: Insufficient documentation

## 2022-07-08 ENCOUNTER — Telehealth: Payer: Self-pay | Admitting: *Deleted

## 2022-07-08 ENCOUNTER — Telehealth (INDEPENDENT_AMBULATORY_CARE_PROVIDER_SITE_OTHER): Payer: Medicare Other | Admitting: Family Medicine

## 2022-07-08 ENCOUNTER — Ambulatory Visit: Payer: Self-pay

## 2022-07-08 ENCOUNTER — Encounter: Payer: Self-pay | Admitting: Family Medicine

## 2022-07-08 DIAGNOSIS — J40 Bronchitis, not specified as acute or chronic: Secondary | ICD-10-CM

## 2022-07-08 MED ORDER — HYDROCOD POLI-CHLORPHE POLI ER 10-8 MG/5ML PO SUER: 5.0000 mL | Freq: Two times a day (BID) | ORAL | 0 refills | Status: DC | PRN

## 2022-07-08 MED ORDER — METHYLPREDNISOLONE 4 MG PO TBPK: ORAL_TABLET | ORAL | 0 refills | Status: DC

## 2022-07-08 NOTE — Telephone Encounter (Signed)
.  left message to have patient return my call.   Dr. Matilde Sprang would like to discuss [place] results with her

## 2022-07-08 NOTE — Telephone Encounter (Signed)
Patient called back on triage line and left a message that she was calling us back.

## 2022-07-08 NOTE — Progress Notes (Signed)
I,Connie R Striblin,acting as a Education administrator for Gwyneth Sprout, FNP.,have documented all relevant documentation on the behalf of Gwyneth Sprout, FNP,as directed by  Gwyneth Sprout, FNP while in the presence of Gwyneth Sprout, FNP.  MyChart Video Visit  Virtual Visit via Video Note   This format is felt to be most appropriate for this patient at this time. Physical exam was limited by quality of the video and audio technology used for the visit.   Patient location: Home Provider location: [facility]  I discussed the limitations of evaluation and management by telemedicine and the availability of in person appointments. The patient expressed understanding and agreed to proceed.  Patient: Emily Stokes   DOB: 1960-06-08   62 y.o. Female  MRN: [id number] Visit Date: 07/08/2022  Today's healthcare provider: Gwyneth Sprout, FNP  Re Introduced to nurse practitioner role and practice setting.  All questions answered.  Discussed provider/patient relationship and expectations.  Subjective    HPI  Cough: Patient complains of cough. Symptoms began 3 days ago. Cough described as non-productive, without wheezing, dyspnea or hemoptysis. Patient denies chills and fever. Associated symptoms include  loss of voice . Current treatments have included  OTC cough medication , with poor improvement.  Patient denies have tobacco smoke exposure.  Medications: Outpatient Medications Prior to Visit  Medication Sig   acetaminophen (TYLENOL) 500 MG tablet Take 500 mg by mouth every 6 (six) hours as needed.   amLODipine (NORVASC) 5 MG tablet Take 5 mg by mouth daily.   aspirin EC (ASPIRIN LOW DOSE) 81 MG tablet TAKE ONE TABLET BY MOUTH ONCE DAILY swallow whole   atorvastatin (LIPITOR) 20 MG tablet TAKE ONE TABLET BY MOUTH ONCE DAILY   gabapentin (NEURONTIN) 400 MG capsule Take 1 capsule (400 mg total) by mouth 3 (three) times daily.   glucose blood (ONETOUCH ULTRA) test strip Use to monitor blood sugars  three times daily as directed   Ibuprofen-diphenhydrAMINE Cit (IBUPROFEN PM PO) Take by mouth at bedtime as needed.   Lancet Devices (EASY MINI EJECT LANCING DEVICE) MISC    Lancets (ONETOUCH ULTRASOFT) lancets Use to monitor blood sugars three times daily as directed   metFORMIN (GLUCOPHAGE) 500 MG tablet Take 1 tablet (500 mg total) by mouth 2 (two) times daily with a meal.   oxybutynin (DITROPAN XL) 10 MG 24 hr tablet Take 1 tablet (10 mg total) by mouth at bedtime.   telmisartan (MICARDIS) 20 MG tablet TAKE ONE TABLET BY MOUTH ONCE DAILY   TRULICITY 3 0000000 SOPN Inject 3 mg as directed once a week.   No facility-administered medications prior to visit.    Review of Systems   Objective    There were no vitals taken for this visit.  Physical Exam Constitutional:      Appearance: Normal appearance.  HENT:     Mouth/Throat:     Comments: laryngitis Pulmonary:     Effort: Pulmonary effort is normal.  Neurological:     General: No focal deficit present.     Mental Status: She is alert and oriented to person, place, and time. Mental status is at baseline.  Psychiatric:        Mood and Affect: Mood normal.        Behavior: Behavior normal.        Thought Content: Thought content normal.        Judgment: Judgment normal.     Assessment & Plan     Problem  List Items Addressed This Visit       Respiratory   Bronchitis - Primary    Acute, self limiting 3 days of symptoms; not improved with PM Nyquil Denies COVID or flu exposures Reports some hoarseness/ laryngitis Denies other s/s at this time Recommend steroid start with use of cough syrup up to BID PRN with plan to RTC after this week if not improved  Your symptoms and exam findings are most consistent with a viral upper respiratory infection. These usually run their course in 5-7 days.   Unfortunately, antibiotics don't work against viruses and just increase your risk of other issues such as diarrhea, yeast  infections, and resistant infections.  If you start feeling worse with facial pain, high fever, cough, shortness of breath or start feeling significantly worse, please call us right away to be further evaluated.  Some things that can make you feel better are: - Increased rest - Increasing Fluids - Acetaminophen / ibuprofen as needed for fever/pain.  - Salt water gargling, chloraseptic spray and throat lozenges - OTC pseudoephedrine.  - Mucinex.  - Saline sinus flushes or a neti pot.  - Humidifying the air.          No follow-ups on file.     I discussed the assessment and treatment plan with the patient. The patient was provided an opportunity to ask questions and all were answered. The patient agreed with the plan and demonstrated an understanding of the instructions.   The patient was advised to call back or seek an in-person evaluation if the symptoms worsen or if the condition fails to improve as anticipated.  I provided 10 minutes of face-to-face time during this encounter discussing current symptoms, plan of care, and supportive therapies.  [name], FNP, have reviewed all documentation for this visit. The documentation on 07/08/22 for the exam, diagnosis, procedures, and orders are all accurate and complete.  Gwyneth Sprout, [place] [phone] (phone) [phone] (fax)  [place]

## 2022-07-08 NOTE — Assessment & Plan Note (Signed)
Acute, self limiting 3 days of symptoms; not improved with PM Nyquil Denies COVID or flu exposures Reports some hoarseness/ laryngitis Denies other s/s at this time Recommend steroid start with use of cough syrup up to BID PRN with plan to RTC after this week if not improved  Your symptoms and exam findings are most consistent with a viral upper respiratory infection. These usually run their course in 5-7 days.   Unfortunately, antibiotics don't work against viruses and just increase your risk of other issues such as diarrhea, yeast infections, and resistant infections.  If you start feeling worse with facial pain, high fever, cough, shortness of breath or start feeling significantly worse, please call us right away to be further evaluated.  Some things that can make you feel better are: - Increased rest - Increasing Fluids - Acetaminophen / ibuprofen as needed for fever/pain.  - Salt water gargling, chloraseptic spray and throat lozenges - OTC pseudoephedrine.  - Mucinex.  - Saline sinus flushes or a neti pot.  - Humidifying the air.

## 2022-07-08 NOTE — Telephone Encounter (Signed)
Summary: Cough   Patient states that she has had a cough since 2/23. Patient states that she has been taking NyQuil and it is not helping. Patient seeking advice.         Chief Complaint: Cough, no other symptoms Symptoms: Above Frequency: 07/05/22 Pertinent Negatives: Patient denies fever Disposition: '[]'$ ED /'[]'$ Urgent Care (no appt availability in office) / '[x]'$ Appointment(In office/virtual)/ '[]'$  [facility]/ '[]'$ Home Care/ '[]'$ Refused Recommended Disposition /'[]'$ [facility]/ '[]'$  Follow-up with PCP Additional Notes:   Reason for Disposition  [1] Continuous (nonstop) coughing interferes with work or school AND [2] no improvement using cough treatment per Care Advice  Answer Assessment - Initial Assessment Questions 1. ONSET: "When did the cough begin?"      07/05/22 2. SEVERITY: "How bad is the cough today?"      Severe 3. SPUTUM: "Describe the color of your sputum" (none, dry cough; clear, white, yellow, green)     None 4. HEMOPTYSIS: "Are you coughing up any blood?" If so ask: "How much?" (flecks, streaks, tablespoons, etc.)     No 5. DIFFICULTY BREATHING: "Are you having difficulty breathing?" If Yes, ask: "How bad is it?" (e.g., mild, moderate, severe)    - MILD: No SOB at rest, mild SOB with walking, speaks normally in sentences, can lie down, no retractions, pulse < 100.    - MODERATE: SOB at rest, SOB with minimal exertion and prefers to sit, cannot lie down flat, speaks in phrases, mild retractions, audible wheezing, pulse 100-120.    - SEVERE: Very SOB at rest, speaks in single words, struggling to breathe, sitting hunched forward, retractions, pulse > 120      No 6. FEVER: "Do you have a fever?" If Yes, ask: "What is your temperature, how was it measured, and when did it start?"     No 7. CARDIAC HISTORY: "Do you have any history of heart disease?" (e.g., heart attack, congestive heart failure)      No 8. LUNG HISTORY: "Do you have any history of lung disease?"   (e.g., pulmonary embolus, asthma, emphysema)     No 9. PE RISK FACTORS: "Do you have a history of blood clots?" (or: recent major surgery, recent prolonged travel, bedridden)     No 10. OTHER SYMPTOMS: "Do you have any other symptoms?" (e.g., runny nose, wheezing, chest pain)       No 11. PREGNANCY: "Is there any chance you are pregnant?" "When was your last menstrual period?"       No 12. TRAVEL: "Have you traveled out of the country in the last month?" (e.g., travel history, exposures)       No  Protocols used: Cough - Acute Non-Productive-A-AH

## 2022-07-10 ENCOUNTER — Other Ambulatory Visit: Payer: Self-pay | Admitting: *Deleted

## 2022-07-10 DIAGNOSIS — N2889 Other specified disorders of kidney and ureter: Secondary | ICD-10-CM

## 2022-07-11 NOTE — Telephone Encounter (Signed)
Emily Stokes, [place] 07/11/2022 11:38 AM EST     CT ordered   Emily Stokes, CMA 07/10/2022  1:05 PM EST     MacDiarmid, Nicki Reaper, MD  Emily Stokes, CMA I spoke to patient re her ultrasound Please order CT scan of abdomen (renal protocol) with and without contrast for renal mass Please do soon and I will call with results

## 2022-07-16 ENCOUNTER — Ambulatory Visit
Admission: RE | Admit: 2022-07-16 | Discharge: 2022-07-16 | Disposition: A | Discharge status: Home / Self Care | Payer: Medicare Other | Source: Ambulatory Visit | Attending: Urology | Admitting: Urology

## 2022-07-16 DIAGNOSIS — N2889 Other specified disorders of kidney and ureter: Secondary | ICD-10-CM | POA: Insufficient documentation

## 2022-07-16 LAB — POCT I-STAT CREATININE: Creatinine, Ser: 1.3 mg/dL — ABNORMAL HIGH (ref 0.44–1.00)

## 2022-07-16 MED ORDER — IOHEXOL 350 MG/ML SOLN
100.0000 mL | Freq: Once | INTRAVENOUS | Status: AC | PRN
  Administered 2022-07-16: 100 mL via INTRAVENOUS

## 2022-07-22 ENCOUNTER — Other Ambulatory Visit: Payer: Self-pay | Admitting: Family Medicine

## 2022-07-22 DIAGNOSIS — E7800 Pure hypercholesterolemia, unspecified: Secondary | ICD-10-CM

## 2022-07-22 DIAGNOSIS — E1159 Type 2 diabetes mellitus with other circulatory complications: Secondary | ICD-10-CM

## 2022-07-23 ENCOUNTER — Telehealth: Payer: Self-pay

## 2022-07-23 NOTE — Progress Notes (Signed)
Care Management & Coordination Services Pharmacy Team Pharmacy Assistant   Name: Emily Stokes  MRN: [id number] DOB: [birth year]  Note made in error please void   [name], CPA/CMA Clinical Pharmacist Assistant Phone: [phone]

## 2022-07-23 NOTE — Telephone Encounter (Signed)
Requested medication (s) are due for refill today:   Yes for 2.   Not sure about the Amlodipine 5 mg  Requested medication (s) are on the active medication list:   Yes for all 3 but Amlodipine is by a historical provider  Future visit scheduled:   No   Last ordered: Micardis 04/13/2022 #30, 2 refills;  Atorvastatin 01/22/2022 #90, 1 refill  Returned  because the Amlodipine is prescribed by a historical provider.     Requested Prescriptions  Pending Prescriptions Disp Refills   amLODipine (NORVASC) 5 MG tablet [Pharmacy Med Name: amlodipine 5 mg tablet] 30 tablet 2    Sig: TAKE ONE TABLET BY MOUTH EVERY MORNING     Cardiovascular: Calcium Channel Blockers 2 Passed - 07/22/2022  8:01 AM      Passed - Last BP in normal range    BP Readings from Last 1 Encounters:  05/22/22 107/67         Passed - Last Heart Rate in normal range    Pulse Readings from Last 1 Encounters:  05/22/22 (!) 102         Passed - Valid encounter within last 6 months    Recent Outpatient Visits           2 weeks ago [place] [name] T, FNP   2 months ago Type 2 diabetes mellitus with hyperglycemia, without long-term current use of insulin [facility])   [place] [name] T, FNP   5 months ago Annual physical exam   [facility] [name] T, FNP   6 months ago No-show for appointment   [facility] [name] T, FNP   7 months ago [place] [name] T, FNP               telmisartan (MICARDIS) 20 MG tablet [Pharmacy Med Name: telmisartan 20 mg tablet] 30 tablet 2    Sig: TAKE ONE TABLET BY MOUTH ONCE DAILY     Cardiovascular:  Angiotensin Receptor Blockers Failed - 07/22/2022  8:01 AM      Failed - Cr in normal range and within 180 days    Creatinine  Date Value Ref Range Status  03/25/2014 1.16 0.60 - 1.30 mg/dL Final    Creatinine, Ser  Date Value Ref Range Status  07/16/2022 1.30 (H) 0.44 - 1.00 mg/dL Final         Passed - K in normal range and within 180 days    Potassium  Date Value Ref Range Status  05/22/2022 4.3 3.5 - 5.2 mmol/L Final  03/25/2014 3.8 3.5 - 5.1 mmol/L Final         Passed - Patient is not pregnant      Passed - Last BP in normal range    BP Readings from Last 1 Encounters:  05/22/22 107/67         Passed - Valid encounter within last 6 months    Recent Outpatient Visits           2 weeks ago [place] [name] T, FNP   2 months ago Type 2 diabetes mellitus with hyperglycemia, without long-term current use of insulin [facility])   [place] Gwyneth Sprout, FNP   5 months ago Annual physical exam   [place] [name]  T, FNP   6 months ago No-show for appointment   [facility] Gwyneth Sprout, FNP   7 months ago [place] [name] T, FNP               atorvastatin (LIPITOR) 20 MG tablet [Pharmacy Med Name: atorvastatin 20 mg tablet] 90 tablet 1    Sig: TAKE ONE TABLET BY MOUTH ONCE DAILY     Cardiovascular:  Antilipid - Statins Failed - 07/22/2022  8:01 AM      Failed - Lipid Panel in normal range within the last 12 months    Cholesterol, Total  Date Value Ref Range Status  02/08/2022 126 100 - 199 mg/dL Final   LDL Chol Calc (NIH)  Date Value Ref Range Status  02/08/2022 83 0 - 99 mg/dL Final   HDL  Date Value Ref Range Status  02/08/2022 28 (L) >39 mg/dL Final   Triglycerides  Date Value Ref Range Status  02/08/2022 73 0 - 149 mg/dL Final         Passed - Patient is not pregnant      Passed - Valid encounter within last 12 months    Recent Outpatient Visits           2 weeks ago [place], Elise T, FNP   2 months ago Type  2 diabetes mellitus with hyperglycemia, without long-term current use of insulin [facility])   [place] Gwyneth Sprout, FNP   5 months ago Annual physical exam   [facility] Gwyneth Sprout, FNP   6 months ago No-show for appointment   [facility] Gwyneth Sprout, FNP   7 months ago [place] Gwyneth Sprout, [place]

## 2022-07-31 ENCOUNTER — Telehealth: Payer: Self-pay | Admitting: Family Medicine

## 2022-07-31 MED ORDER — BLOOD GLUCOSE TEST VI STRP: ORAL_STRIP | 3 refills | Status: DC

## 2022-07-31 MED ORDER — BLOOD GLUCOSE MONITORING SUPPL DEVI: 1.0000 | Freq: Every day | 0 refills | Status: DC

## 2022-07-31 MED ORDER — LANCETS MISC. MISC: 0 refills | Status: DC

## 2022-07-31 NOTE — Telephone Encounter (Signed)
PA started today

## 2022-07-31 NOTE — Telephone Encounter (Signed)
[facility] claim for Accu Chek Guide me w/ Device Stokes has been rejected and requires prior authorization Keyphase: [place] [facility] Name: Emily Stokes

## 2022-07-31 NOTE — Telephone Encounter (Signed)
Your PA has been resolved, no additional PA is required. Pharmacy advised. They report patient has a high deductible plan. They will contact patient.

## 2022-07-31 NOTE — Telephone Encounter (Signed)
[facility] claim for Trulicity 3mg /0.13ml pen injectors has been rejected and requires prior authorization Keyphase: [place] Name: [place] Trulicity 3mg /0.45ml pen injectors

## 2022-07-31 NOTE — Telephone Encounter (Signed)
[facility] claim for onetouch ultra strips has been rejected and requires prior authorization Keyphase: [place] Name: [place] OneTouch Ultra Strips

## 2022-07-31 NOTE — Telephone Encounter (Signed)
Emily Stokes with [facility] is calling to let PCP know that pt insurance has denied her one touch testing strips and meter. The insurance is requesting Accu Check test meter and strips.   Please advise.

## 2022-08-05 ENCOUNTER — Ambulatory Visit (INDEPENDENT_AMBULATORY_CARE_PROVIDER_SITE_OTHER): Payer: Medicare HMO | Admitting: Urology

## 2022-08-05 VITALS — BP 149/69 | HR 94 | Ht 69.0 in | Wt 197.0 lb

## 2022-08-05 DIAGNOSIS — R350 Frequency of micturition: Secondary | ICD-10-CM | POA: Diagnosis not present

## 2022-08-05 DIAGNOSIS — N2889 Other specified disorders of kidney and ureter: Secondary | ICD-10-CM | POA: Diagnosis not present

## 2022-08-05 DIAGNOSIS — N3020 Other chronic cystitis without hematuria: Secondary | ICD-10-CM

## 2022-08-05 LAB — URINALYSIS, COMPLETE
Bilirubin, UA: NEGATIVE
Glucose, UA: NEGATIVE
Ketones, UA: NEGATIVE
Nitrite, UA: POSITIVE — AB
Protein,UA: NEGATIVE
Specific Gravity, UA: 1.015 (ref 1.005–1.030)
Urobilinogen, Ur: 0.2 mg/dL (ref 0.2–1.0)
pH, UA: 5 (ref 5.0–7.5)

## 2022-08-05 LAB — MICROSCOPIC EXAMINATION
Epithelial Cells (non renal): >10 /hpf — AB (ref 0–10)
WBC, UA: >30 /hpf — AB (ref 0–5)

## 2022-08-05 MED ORDER — AMOXICILLIN-POT CLAVULANATE 875-125 MG PO TABS: 1.0000 | ORAL_TABLET | Freq: Two times a day (BID) | ORAL | 0 refills | Status: AC

## 2022-08-05 MED ORDER — NITROFURANTOIN MACROCRYSTAL 100 MG PO CAPS: 100.0000 mg | ORAL_CAPSULE | Freq: Every day | ORAL | 11 refills | Status: DC

## 2022-08-05 NOTE — Progress Notes (Signed)
08/05/2022 2:44 PM   [place] March 26, 1961 [id number]  Referring provider: Gwyneth Sprout, [place] [address] [place],  [place] [address]  No chief complaint on file.   HPI: I was consulted to assess the patient for recurrent bladder infections.  She complains of worsening urge incontinence frequency and foul-smelling urine that respond favorably to antibiotics.   At baseline she voids every 2 hours and gets up 3 times at night.  Sometimes she leaks with coughing sneezing.  Sometimes she has urge incontinence.  Most days she does not wear a pad but sometimes she does   She thinks she may be infected today   She has had a hysterectomy kidney stone   She has had positive urine cultures in the medical record    Pathophysiology of UTIs discussed. Call if renal ultrasound is normal. Call in ciprofloxacin 250 mg twice a day for 7 days. Then placed patient on nitrofurantoin 100 mg 30 x 11. Have patient come back and see me for cystoscopy in approximately 6 to 7 weeks   Today Frequency stable.  Last culture negative.  She had a lesion noted on ultrasound so I ordered a CT scan.  She had bilateral renal cortical scarring with some general atrophy of the right kidney likely from infection.  She was cleared on CT scan for renal cyst with no cancer.  Patient never took the daily Macrodantin.  Clinically not infected today but she was a bit vague  On pelvic examination she is a mild cystocele no stress incontinence.  Cystoscopy: Patient went flexible cystoscopy.  Urine was very cloudy.  There was a lot of sediment.  I looked carefully around the bladder.  It was more challenging to see the urothelium.  No carcinoma.  She has never smoked.        PMH: Past Medical History:  Diagnosis Date   Abdominal pain, epigastric    Acute cystitis without hematuria 04/19/2021   Anemia    Backache    Bronchitis    Calculus, kidney    Carpal tunnel syndrome    Cellulitis of face  05/30/2016   Chest pain    Chicken pox    Circulatory disease    Diabetes mellitus without complication (HCC)    Disorder of kidney and ureter    Excess, menstruation    Hypercholesteremia    Hypertension, essential, benign    Infected postoperative seroma    Infected surgical wound 09/15/2008   Iron deficiency    Leg pain 02/02/2015   Malaise and fatigue    Measles    MRSA (methicillin resistant staph aureus) culture positive    Nausea alone    Nonspecific abnormal electrocardiogram (ECG) (EKG)    Pain in elbow 02/05/2016   Pain in joint, lower leg    Pain in shoulder 12/05/2011   Sleep apnea     Surgical History: Past Surgical History:  Procedure Laterality Date   ABDOMINAL HYSTERECTOMY     CESAREAN SECTION     COLONOSCOPY  1997   COLONOSCOPY WITH PROPOFOL N/A 01/09/2018   Procedure: COLONOSCOPY WITH PROPOFOL;  Surgeon: Virgel Manifold, MD;  Location: [facility] ENDOSCOPY;  Service: Endoscopy;  Laterality: N/A;   COLONOSCOPY WITH PROPOFOL N/A 08/20/2019   Procedure: COLONOSCOPY WITH PROPOFOL;  Surgeon: Lucilla Lame, MD;  Location: [facility] ENDOSCOPY;  Service: Endoscopy;  Laterality: N/A;   COLOSTOMY TAKEDOWN  2012   Hartmann's procedure.     HERNIA REPAIR  0000000   umbilical  LITHOTRIPSY  0000000   with complications, hospitalized due to these complications for 3 months   mrsa     removal on nose   PARTIAL HYSTERECTOMY  2009    vaginal hysterectomy, has both ovaries    Home Medications:  Allergies as of 08/05/2022       Reactions   Sulfamethoxazole-trimethoprim Swelling, Hives   Ciprofloxacin Itching, Rash   Swollen around eyes and mouth.         Medication List        Accurate as of August 05, 2022  2:44 PM. If you have any questions, ask your nurse or doctor.          acetaminophen 500 MG tablet Commonly known as: TYLENOL Take 500 mg by mouth every 6 (six) hours as needed.   amLODipine 5 MG tablet Commonly known as: NORVASC TAKE ONE TABLET BY MOUTH  EVERY MORNING   aspirin EC 81 MG tablet Commonly known as: Aspirin Low Dose TAKE ONE TABLET BY MOUTH ONCE DAILY swallow whole   atorvastatin 20 MG tablet Commonly known as: LIPITOR TAKE ONE TABLET BY MOUTH ONCE DAILY   Blood Glucose Monitoring Suppl Devi 1 each by Does not apply route daily in the afternoon. May substitute to any manufacturer covered by patient's insurance.   BLOOD GLUCOSE TEST STRIPS Strp Use to check fasting blood sugar daily. May substitute to any manufacturer covered by patient's insurance.   chlorpheniramine-HYDROcodone 10-8 MG/5ML Commonly known as: TUSSIONEX Take 5 mLs by mouth every 12 (twelve) hours as needed for cough.   gabapentin 400 MG capsule Commonly known as: Neurontin Take 1 capsule (400 mg total) by mouth 3 (three) times daily.   IBUPROFEN PM PO Take by mouth at bedtime as needed.   Lancets Misc. Misc Use to check fasting blood sugar daily. May substitute to any manufacturer covered by patient's insurance.   metFORMIN 500 MG tablet Commonly known as: GLUCOPHAGE Take 1 tablet (500 mg total) by mouth 2 (two) times daily with a meal.   methylPREDNISolone 4 MG Tbpk tablet Commonly known as: MEDROL DOSEPAK Take as directed on package   onetouch ultrasoft lancets Use to monitor blood sugars three times daily as directed   oxybutynin 10 MG 24 hr tablet Commonly known as: Ditropan XL Take 1 tablet (10 mg total) by mouth at bedtime.   telmisartan 20 MG tablet Commonly known as: MICARDIS TAKE ONE TABLET BY MOUTH ONCE DAILY   Trulicity 3 0000000 Sopn Generic drug: Dulaglutide Inject 3 mg as directed once a week.        Allergies:  Allergies  Allergen Reactions   Sulfamethoxazole-Trimethoprim Swelling and Hives   Ciprofloxacin Itching and Rash    Swollen around eyes and mouth.     Family History: Family History  Problem Relation Age of Onset   Hypertension Father    Stroke Father    Diabetes Mother    Hypertension Mother     Diabetes Other        sibling   Diabetes Other        sibling   Diabetes Other        sibling   Hypertension Other        sibling   Hypertension Other        sibling   Hypertension Other        sibling    Social History:  reports that she has never smoked. She has never used smokeless tobacco. She reports that she does not drink  alcohol and does not use drugs.  ROS:                                        Physical Exam: There were no vitals taken for this visit.  Constitutional:  Alert and oriented, No acute distress.   Laboratory Data: Lab Results  Component Value Date   WBC 7.3 05/22/2022   HGB 12.6 05/22/2022   HCT 36.6 05/22/2022   MCV 93 05/22/2022   PLT 354 05/22/2022    Lab Results  Component Value Date   CREATININE 1.30 (H) 07/16/2022    No results found for: "PSA"  No results found for: "TESTOSTERONE"  Lab Results  Component Value Date   HGBA1C 6.0 (H) 05/22/2022    Urinalysis    Component Value Date/Time   COLORURINE YELLOW 07/07/2017 1715   APPEARANCEUR Cloudy (A) 04/29/2022 1038   LABSPEC 1.009 07/07/2017 1715   LABSPEC 1.015 03/25/2014 0925   PHURINE 5.0 07/07/2017 1715   GLUCOSEU Negative 04/29/2022 1038   GLUCOSEU NEGATIVE 03/25/2014 0925   HGBUR MODERATE (A) 07/07/2017 1715   BILIRUBINUR Negative 04/29/2022 1038   BILIRUBINUR NEGATIVE 03/25/2014 0925   KETONESUR NEGATIVE 07/07/2017 1715   PROTEINUR 1+ (A) 04/29/2022 1038   PROTEINUR NEGATIVE 07/07/2017 1715   UROBILINOGEN 0.2 12/19/2021 0945   NITRITE Positive (A) 04/29/2022 1038   NITRITE POSITIVE (A) 07/07/2017 1715   LEUKOCYTESUR 3+ (A) 04/29/2022 1038   LEUKOCYTESUR NEGATIVE 03/25/2014 0925    Pertinent Imaging:   Assessment & Plan: Patient looks like she had a bladder infection by urinalysis and cystoscopy today.  The last culture demonstrated a lot of resistance and based upon allergies I called and Augmentin 1 tablet twice a day for 7 days.  She  would then start daily Macrodantin 100 mg 30 x 11 based on allergies.  Reassess in 8 weeks.  Call if culture differs  1. Other specified disorders of kidney and ureter  - Urinalysis, Complete  2. Urinary frequency    No follow-ups on file.  Reece Packer, MD  [place] [address] [place], [place] [address] [phone]

## 2022-08-07 ENCOUNTER — Ambulatory Visit: Payer: Medicare Other | Admitting: Family Medicine

## 2022-08-07 LAB — CULTURE, URINE COMPREHENSIVE

## 2022-09-06 ENCOUNTER — Other Ambulatory Visit: Payer: Self-pay | Admitting: Family Medicine

## 2022-09-06 DIAGNOSIS — E1165 Type 2 diabetes mellitus with hyperglycemia: Secondary | ICD-10-CM

## 2022-09-06 NOTE — Telephone Encounter (Signed)
Pt called saying she has not taken the Trulicity in about a month.  She said she called [facility] and they told her they can't get it in.  Pt said her blood sugr is going up.  Please advise  [phone]

## 2022-09-06 NOTE — Telephone Encounter (Signed)
Patient is concerned about elevated glucose due to not taking medication, advise patient.

## 2022-09-09 MED ORDER — TRULICITY 3 MG/0.5ML ~~LOC~~ SOAJ: 3.0000 mg | SUBCUTANEOUS | 1 refills | Status: DC

## 2022-09-10 ENCOUNTER — Encounter: Payer: Self-pay | Admitting: Family Medicine

## 2022-09-10 DIAGNOSIS — E7800 Pure hypercholesterolemia, unspecified: Secondary | ICD-10-CM | POA: Diagnosis not present

## 2022-09-10 DIAGNOSIS — E1165 Type 2 diabetes mellitus with hyperglycemia: Secondary | ICD-10-CM | POA: Diagnosis not present

## 2022-09-10 DIAGNOSIS — E049 Nontoxic goiter, unspecified: Secondary | ICD-10-CM | POA: Diagnosis not present

## 2022-09-10 DIAGNOSIS — I10 Essential (primary) hypertension: Secondary | ICD-10-CM | POA: Diagnosis not present

## 2022-09-11 DIAGNOSIS — E118 Type 2 diabetes mellitus with unspecified complications: Secondary | ICD-10-CM | POA: Diagnosis not present

## 2022-09-12 ENCOUNTER — Telehealth: Payer: Self-pay

## 2022-09-12 NOTE — Telephone Encounter (Signed)
Copied from CRM [id number]. Topic: General - Other >> Sep 11, 2022 10:41 AM Carrielelia G wrote: Reason for CRM: Nurse Suzie Portela recently filled out a form for patient, but form needs to be signed off by a medical doctor. Patient would like to know is she can bring the document back up for a doctors signature?  Please advise patient. Thank you

## 2022-09-16 ENCOUNTER — Ambulatory Visit: Payer: Medicare Other | Admitting: Family Medicine

## 2022-09-16 DIAGNOSIS — E118 Type 2 diabetes mellitus with unspecified complications: Secondary | ICD-10-CM | POA: Diagnosis not present

## 2022-09-29 ENCOUNTER — Other Ambulatory Visit: Payer: Self-pay | Admitting: Family Medicine

## 2022-09-29 DIAGNOSIS — N3281 Overactive bladder: Secondary | ICD-10-CM

## 2022-09-30 ENCOUNTER — Ambulatory Visit: Payer: Medicare Other | Admitting: Urology

## 2022-09-30 DIAGNOSIS — E049 Nontoxic goiter, unspecified: Secondary | ICD-10-CM | POA: Diagnosis not present

## 2022-09-30 DIAGNOSIS — I10 Essential (primary) hypertension: Secondary | ICD-10-CM | POA: Diagnosis not present

## 2022-09-30 DIAGNOSIS — E1165 Type 2 diabetes mellitus with hyperglycemia: Secondary | ICD-10-CM | POA: Diagnosis not present

## 2022-09-30 DIAGNOSIS — E7800 Pure hypercholesterolemia, unspecified: Secondary | ICD-10-CM | POA: Diagnosis not present

## 2022-09-30 NOTE — Telephone Encounter (Signed)
Requested Prescriptions  Pending Prescriptions Disp Refills   oxybutynin (DITROPAN-XL) 10 MG 24 hr tablet [Pharmacy Med Name: oxybutynin chloride ER 10 mg tablet,extended release 24 hr] 90 tablet 1    Sig: TAKE ONE TABLET BY MOUTH EVERYDAY AT BEDTIME     Urology:  Bladder Agents Passed - 09/29/2022  8:04 AM      Passed - Valid encounter within last 12 months    Recent Outpatient Visits           2 months ago Bronchitis   [facility] Merita Norton T, FNP   4 months ago Type 2 diabetes mellitus with hyperglycemia, without long-term current use of insulin [facility])   [place] [facility] Jacky Kindle, FNP   7 months ago Annual physical exam   [facility] Jacky Kindle, FNP   8 months ago No-show for appointment   [facility] Jacky Kindle, FNP   9 months ago Dysuria   [facility] Jacky Kindle, FNP       Future Appointments             In 1 month MacDiarmid, Lorin Picket, MD [facility]

## 2022-10-02 ENCOUNTER — Other Ambulatory Visit: Payer: Self-pay | Admitting: Family Medicine

## 2022-10-02 DIAGNOSIS — E1165 Type 2 diabetes mellitus with hyperglycemia: Secondary | ICD-10-CM

## 2022-10-02 NOTE — Telephone Encounter (Signed)
Requested Prescriptions  Pending Prescriptions Disp Refills   Lancets (ONETOUCH DELICA PLUS LANCET33G) MISC [Pharmacy Med Name: OneTouch Delica Plus Lancet 33 gauge] 100 each 6    Sig: USE TO check blood glucose three times daily AS DIRECTED     Endocrinology: Diabetes - Testing Supplies Passed - 10/02/2022  2:10 PM      Passed - Valid encounter within last 12 months    Recent Outpatient Visits           2 months ago Bronchitis   [facility] Merita Norton T, FNP   4 months ago Type 2 diabetes mellitus with hyperglycemia, without long-term current use of insulin [facility])   [place] [facility] Jacky Kindle, FNP   7 months ago Annual physical exam   [facility] Jacky Kindle, FNP   8 months ago No-show for appointment   [facility] Jacky Kindle, FNP   9 months ago Dysuria   [facility] Jacky Kindle, FNP       Future Appointments             In 1 month MacDiarmid, Lorin Picket, MD [facility]

## 2022-10-14 DIAGNOSIS — E118 Type 2 diabetes mellitus with unspecified complications: Secondary | ICD-10-CM | POA: Diagnosis not present

## 2022-10-21 NOTE — Progress Notes (Deleted)
      Established patient visit   Patient: Emily Stokes   DOB: 26-Jan-1961   62 y.o. Female  MRN: [id number] Visit Date: 10/22/2022  Today's healthcare provider: Mila Merry, MD   No chief complaint on file.  Subjective    HPI  ***  Medications: Outpatient Medications Prior to Visit  Medication Sig   acetaminophen (TYLENOL) 500 MG tablet Take 500 mg by mouth every 6 (six) hours as needed.   amLODipine (NORVASC) 5 MG tablet TAKE ONE TABLET BY MOUTH EVERY MORNING   aspirin EC (ASPIRIN LOW DOSE) 81 MG tablet TAKE ONE TABLET BY MOUTH ONCE DAILY swallow whole   atorvastatin (LIPITOR) 20 MG tablet TAKE ONE TABLET BY MOUTH ONCE DAILY   Blood Glucose Monitoring Suppl DEVI 1 each by Does not apply route daily in the afternoon. May substitute to any manufacturer covered by patient's insurance.   chlorpheniramine-HYDROcodone (TUSSIONEX) 10-8 MG/5ML Take 5 mLs by mouth every 12 (twelve) hours as needed for cough.   Dulaglutide (TRULICITY) 3 MG/0.5ML SOPN Inject 3 mg as directed once a week.   gabapentin (NEURONTIN) 400 MG capsule Take 1 capsule (400 mg total) by mouth 3 (three) times daily.   Glucose Blood (BLOOD GLUCOSE TEST STRIPS) STRP Use to check fasting blood sugar daily. May substitute to any manufacturer covered by patient's insurance.   Ibuprofen-diphenhydrAMINE Cit (IBUPROFEN PM PO) Take by mouth at bedtime as needed.   Lancets (ONETOUCH DELICA PLUS LANCET33G) MISC USE TO check blood glucose three times daily AS DIRECTED   Lancets Misc. MISC Use to check fasting blood sugar daily. May substitute to any manufacturer covered by patient's insurance.   metFORMIN (GLUCOPHAGE) 500 MG tablet Take 1 tablet (500 mg total) by mouth 2 (two) times daily with a meal.   methylPREDNISolone (MEDROL DOSEPAK) 4 MG TBPK tablet Take as directed on package   nitrofurantoin (MACRODANTIN) 100 MG capsule Take 1 capsule (100 mg total) by mouth daily.   oxybutynin (DITROPAN-XL) 10 MG 24 hr tablet TAKE ONE  TABLET BY MOUTH EVERYDAY AT BEDTIME   telmisartan (MICARDIS) 20 MG tablet TAKE ONE TABLET BY MOUTH ONCE DAILY   No facility-administered medications prior to visit.    Review of Systems  {Labs  Heme  Chem  Endocrine  Serology  Results Review (optional):23779}   Objective    There were no vitals taken for this visit. {Show previous vital signs (optional):23777}  Physical Exam  ***  No results found for any visits on 10/22/22.  Assessment & Plan     ***  No follow-ups on file.      {provider attestation***:1}   Mila Merry, MD  [facility] [phone] (phone) [phone] (fax)  [facility]

## 2022-10-22 ENCOUNTER — Ambulatory Visit: Payer: Medicare Other | Admitting: Family Medicine

## 2022-10-22 DIAGNOSIS — E118 Type 2 diabetes mellitus with unspecified complications: Secondary | ICD-10-CM | POA: Diagnosis not present

## 2022-10-23 ENCOUNTER — Ambulatory Visit: Payer: Medicare Other | Admitting: Family Medicine

## 2022-10-23 NOTE — Progress Notes (Deleted)
     I,Sha'taria Tyson,acting as a Neurosurgeon for [employer], MD.,have documented all relevant documentation on the behalf of Ronnald Ramp, MD,as directed by  Ronnald Ramp, MD while in the presence of Ronnald Ramp, MD.   Established patient visit   Patient: Emily Stokes   DOB: 02/29/1961   62 y.o. Female  MRN: [id number] Visit Date: 10/23/2022  Today's healthcare provider: Ronnald Ramp, MD   No chief complaint on file.  Subjective    HPI  Patient is requesting paperwork to be completed by MD.  -Eye Exam: -Shingles Vaccine:   Medications: Outpatient Medications Prior to Visit  Medication Sig   acetaminophen (TYLENOL) 500 MG tablet Take 500 mg by mouth every 6 (six) hours as needed.   amLODipine (NORVASC) 5 MG tablet TAKE ONE TABLET BY MOUTH EVERY MORNING   aspirin EC (ASPIRIN LOW DOSE) 81 MG tablet TAKE ONE TABLET BY MOUTH ONCE DAILY swallow whole   atorvastatin (LIPITOR) 20 MG tablet TAKE ONE TABLET BY MOUTH ONCE DAILY   Blood Glucose Monitoring Suppl DEVI 1 each by Does not apply route daily in the afternoon. May substitute to any manufacturer covered by patient's insurance.   chlorpheniramine-HYDROcodone (TUSSIONEX) 10-8 MG/5ML Take 5 mLs by mouth every 12 (twelve) hours as needed for cough.   Dulaglutide (TRULICITY) 3 MG/0.5ML SOPN Inject 3 mg as directed once a week.   gabapentin (NEURONTIN) 400 MG capsule Take 1 capsule (400 mg total) by mouth 3 (three) times daily.   Glucose Blood (BLOOD GLUCOSE TEST STRIPS) STRP Use to check fasting blood sugar daily. May substitute to any manufacturer covered by patient's insurance.   Ibuprofen-diphenhydrAMINE Cit (IBUPROFEN PM PO) Take by mouth at bedtime as needed.   Lancets (ONETOUCH DELICA PLUS LANCET33G) MISC USE TO check blood glucose three times daily AS DIRECTED   Lancets Misc. MISC Use to check fasting blood sugar daily. May substitute to any manufacturer covered by  patient's insurance.   metFORMIN (GLUCOPHAGE) 500 MG tablet Take 1 tablet (500 mg total) by mouth 2 (two) times daily with a meal.   methylPREDNISolone (MEDROL DOSEPAK) 4 MG TBPK tablet Take as directed on package   nitrofurantoin (MACRODANTIN) 100 MG capsule Take 1 capsule (100 mg total) by mouth daily.   oxybutynin (DITROPAN-XL) 10 MG 24 hr tablet TAKE ONE TABLET BY MOUTH EVERYDAY AT BEDTIME   telmisartan (MICARDIS) 20 MG tablet TAKE ONE TABLET BY MOUTH ONCE DAILY   No facility-administered medications prior to visit.    Review of Systems  {Labs  Heme  Chem  Endocrine  Serology  Results Review (optional):23779}   Objective    There were no vitals taken for this visit. {Show previous vital signs (optional):23777}  Physical Exam  ***  No results found for any visits on 10/23/22.  Assessment & Plan     ***  No follow-ups on file.      {provider attestation***:1}   Ronnald Ramp, MD  [facility] [phone] (phone) [phone] (fax)  [facility]

## 2022-10-24 ENCOUNTER — Telehealth: Payer: Self-pay

## 2022-10-24 NOTE — Telephone Encounter (Signed)
Copied from CRM [id number]. Topic: General - Other >> Oct 24, 2022  9:20 AM [place] B wrote: Reason for CRM: pt called in states needs paperwork filled out again. She Says Dr Suzie Portela filled it out but it has to be done by a Dr not FNP. Please call back for further assistance

## 2022-10-25 NOTE — Telephone Encounter (Signed)
Pt is calling back to see which doctor she is able to see to get her paperwork filled out. Per pt she has to have it filled out as soon as possible.   Please advise.

## 2022-10-28 ENCOUNTER — Other Ambulatory Visit: Payer: Self-pay | Admitting: Family Medicine

## 2022-10-28 ENCOUNTER — Encounter: Payer: Self-pay | Admitting: Family Medicine

## 2022-10-28 ENCOUNTER — Ambulatory Visit (INDEPENDENT_AMBULATORY_CARE_PROVIDER_SITE_OTHER): Payer: Medicare Other | Admitting: Family Medicine

## 2022-10-28 VITALS — BP 110/72 | HR 77 | Ht 69.0 in | Wt 191.9 lb

## 2022-10-28 DIAGNOSIS — M25562 Pain in left knee: Secondary | ICD-10-CM | POA: Diagnosis not present

## 2022-10-28 DIAGNOSIS — M25561 Pain in right knee: Secondary | ICD-10-CM

## 2022-10-28 DIAGNOSIS — E119 Type 2 diabetes mellitus without complications: Secondary | ICD-10-CM

## 2022-10-28 DIAGNOSIS — E1159 Type 2 diabetes mellitus with other circulatory complications: Secondary | ICD-10-CM

## 2022-10-28 NOTE — Assessment & Plan Note (Signed)
Chronic problem Stable Completed forms and supportive patient is to return to school for masters program

## 2022-10-28 NOTE — Progress Notes (Signed)
I,Sha'taria Tyson,acting as a Neurosurgeon for [employer], MD.,have documented all relevant documentation on the behalf of Ronnald Ramp, MD,as directed by  Ronnald Ramp, MD while in the presence of Ronnald Ramp, MD.   Established patient visit   Patient: Emily Stokes   DOB: 1961/03/06   62 y.o. Female  MRN: [id number] Visit Date: 10/28/2022  Today's healthcare provider: Ronnald Ramp, MD   No chief complaint on file.  Subjective    HPI  Patient is requesting paperwork to be completed by MD.  Marybelle Killings will support her being able to return to  Previously completed by Merita Norton but they require it to be signed by MD  Patient reports that she had started school when she became sick and was hospitalized several years ago She states that she is returning to school and needs form to be signed by physician in support of her returning to school and being able to participate in program of study and with patient's understanding of responsibility for student loans  -Eye Exam: Needs to update -Shingles Vaccine:   Medications: Outpatient Medications Prior to Visit  Medication Sig   acetaminophen (TYLENOL) 500 MG tablet Take 500 mg by mouth every 6 (six) hours as needed.   aspirin EC (ASPIRIN LOW DOSE) 81 MG tablet TAKE ONE TABLET BY MOUTH ONCE DAILY swallow whole   atorvastatin (LIPITOR) 20 MG tablet TAKE ONE TABLET BY MOUTH ONCE DAILY   Blood Glucose Monitoring Suppl DEVI 1 each by Does not apply route daily in the afternoon. May substitute to any manufacturer covered by patient's insurance.   chlorpheniramine-HYDROcodone (TUSSIONEX) 10-8 MG/5ML Take 5 mLs by mouth every 12 (twelve) hours as needed for cough.   gabapentin (NEURONTIN) 400 MG capsule Take 1 capsule (400 mg total) by mouth 3 (three) times daily.   Glucose Blood (BLOOD GLUCOSE TEST STRIPS) STRP Use to check fasting blood sugar daily. May substitute to any manufacturer  covered by patient's insurance.   Ibuprofen-diphenhydrAMINE Cit (IBUPROFEN PM PO) Take by mouth at bedtime as needed.   Lancets (ONETOUCH DELICA PLUS LANCET33G) MISC USE TO check blood glucose three times daily AS DIRECTED   Lancets Misc. MISC Use to check fasting blood sugar daily. May substitute to any manufacturer covered by patient's insurance.   methylPREDNISolone (MEDROL DOSEPAK) 4 MG TBPK tablet Take as directed on package   nitrofurantoin (MACRODANTIN) 100 MG capsule Take 1 capsule (100 mg total) by mouth daily.   oxybutynin (DITROPAN-XL) 10 MG 24 hr tablet TAKE ONE TABLET BY MOUTH EVERYDAY AT BEDTIME   [DISCONTINUED] amLODipine (NORVASC) 5 MG tablet TAKE ONE TABLET BY MOUTH EVERY MORNING   [DISCONTINUED] metFORMIN (GLUCOPHAGE) 500 MG tablet Take 1 tablet (500 mg total) by mouth 2 (two) times daily with a meal.   [DISCONTINUED] telmisartan (MICARDIS) 20 MG tablet TAKE ONE TABLET BY MOUTH ONCE DAILY   Dulaglutide (TRULICITY) 3 MG/0.5ML SOPN Inject 3 mg as directed once a week. (Patient not taking: Reported on 10/28/2022)   No facility-administered medications prior to visit.    Review of Systems     Objective    BP 110/72 (BP Location: Right Arm, Patient Position: Sitting, Cuff Size: Normal)   Pulse 77   Ht 5\' 9"  (1.753 m)   Wt 191 lb 14.4 oz (87 kg)   BMI 28.34 kg/m    Physical Exam Vitals reviewed.  Constitutional:      General: She is not in acute distress.    Appearance: Normal appearance. She is  not ill-appearing, toxic-appearing or diaphoretic.  Eyes:     Conjunctiva/sclera: Conjunctivae normal.  Cardiovascular:     Rate and Rhythm: Normal rate and regular rhythm.     Pulses: Normal pulses.     Heart sounds: Normal heart sounds.  Pulmonary:     Effort: Pulmonary effort is normal. No respiratory distress.     Breath sounds: Normal breath sounds. No stridor. No wheezing, rhonchi or rales.  Abdominal:     General: Bowel sounds are normal. There is no distension.      Palpations: Abdomen is soft.     Tenderness: There is no abdominal tenderness.  Musculoskeletal:     Right lower leg: No edema.     Left lower leg: No edema.  Skin:    Findings: No erythema or rash.  Neurological:     Mental Status: She is alert and oriented to person, place, and time.       No results found for any visits on 10/28/22.  Assessment & Plan     Problem List Items Addressed This Visit       Other   Arthralgia of lower leg - Primary    Chronic problem Stable Completed forms and supportive patient is to return to school for masters program          Return in about 3 weeks (around 11/18/2022) for diabetes .        The entirety of the information documented in the History of Present Illness, Review of Systems and Physical Exam were personally obtained by me. Portions of this information were initially documented by Acey Lav. I, Ronnald Ramp, MD have reviewed the documentation above for thoroughness and accuracy.      Ronnald Ramp, MD  [facility] [phone] (phone) [phone] (fax)  [facility]

## 2022-10-29 ENCOUNTER — Encounter: Payer: Self-pay | Admitting: Student in an Organized Health Care Education/Training Program

## 2022-10-29 ENCOUNTER — Ambulatory Visit
Payer: Medicare Other | Attending: Student in an Organized Health Care Education/Training Program | Admitting: Student in an Organized Health Care Education/Training Program

## 2022-10-29 VITALS — BP 120/78 | HR 71 | Temp 97.5°F | Resp 16 | Ht 69.0 in | Wt 191.0 lb

## 2022-10-29 DIAGNOSIS — M48062 Spinal stenosis, lumbar region with neurogenic claudication: Secondary | ICD-10-CM | POA: Insufficient documentation

## 2022-10-29 DIAGNOSIS — M47816 Spondylosis without myelopathy or radiculopathy, lumbar region: Secondary | ICD-10-CM | POA: Insufficient documentation

## 2022-10-29 DIAGNOSIS — M5416 Radiculopathy, lumbar region: Secondary | ICD-10-CM | POA: Diagnosis not present

## 2022-10-29 DIAGNOSIS — G894 Chronic pain syndrome: Secondary | ICD-10-CM | POA: Diagnosis not present

## 2022-10-29 MED ORDER — TRAMADOL HCL 50 MG PO TABS: 50.0000 mg | ORAL_TABLET | Freq: Two times a day (BID) | ORAL | 1 refills | Status: AC | PRN

## 2022-10-29 MED ORDER — GABAPENTIN 400 MG PO CAPS: 400.0000 mg | ORAL_CAPSULE | Freq: Three times a day (TID) | ORAL | 5 refills | Status: DC

## 2022-10-29 NOTE — Progress Notes (Signed)
Safety precautions to be maintained throughout the outpatient stay will include: orient to surroundings, keep bed in low position, maintain call bell within reach at all times, provide assistance with transfer out of bed and ambulation.  

## 2022-10-29 NOTE — Progress Notes (Signed)
PROVIDER NOTE: Information contained herein reflects review and annotations entered in association with encounter. Interpretation of such information and data should be left to medically-trained personnel. Information provided to patient can be located elsewhere in the medical record under "Patient Instructions". Document created using STT-dictation technology, any transcriptional errors that may result from process are unintentional.    Patient: Emily Stokes  Service Category: E/M  Provider: Edward Jolly, MD  DOB: 01/13/1961  DOS: 10/29/2022  Specialty: Interventional Pain Management  MRN: [id number]  Setting: Ambulatory outpatient  PCP: Jacky Kindle, FNP  Type: Established Patient    Referring Provider: Jacky Kindle, FNP  Location: Office  Delivery: Face-to-face     HPI  [name], a 62 y.o. year old female, is here today because of her Lumbar radiculopathy [M54.16]. [name] primary complain today is Leg Pain (left) Last encounter: My last encounter with her was on 05/09/22 Pertinent problems: [name] has Arthralgia of lower leg; Lumbar degenerative disc disease; Lumbar radiculopathy; Chronic pain syndrome; Lumbar facet arthropathy; and Lumbar spondylosis on their pertinent problem list. Pain Assessment: Severity of Chronic pain is reported as a 8 /10. Location: Leg Left/hip to thigh. Onset:  . Quality: Lambert Mody, Nagging, Other (Comment) (toothache-like). Timing: Constant. Modifying factor(s): meds. Vitals:  height is 5\' 9"  (1.753 m) and weight is 191 lb (86.6 kg). Her temporal temperature is 97.5 F (36.4 C) (abnormal). Her blood pressure is 120/78 and her pulse is 71. Her respiration is 16 and oxygen saturation is 97%.   Reason for encounter: medication management.   Presents today for refill of Gabapentin, tolerating 400 mg TID. Continues to have low back pain with radiation to her lateral hip and down her medial thigh/groin in a dermatomal fashion, has had previous L-ESI's  but they were not too helpful Requesting stronger analgesic that she can take as needed for breakthrough pain PMP checked and appropriate  ROS  Constitutional: Denies any fever or chills Gastrointestinal: No reported hemesis, hematochezia, vomiting, or acute GI distress Musculoskeletal:  low back pain  + left leg pain Neurological: No reported episodes of acute onset apraxia, aphasia, dysarthria, agnosia, amnesia, paralysis, loss of coordination, or loss of consciousness  Medication Review  BLOOD GLUCOSE TEST STRIPS, Blood Glucose Monitoring Suppl, Ibuprofen-diphenhydrAMINE Cit, Lancets Misc., OneTouch Delica Plus Lancet33G, acetaminophen, amLODipine, aspirin EC, atorvastatin, gabapentin, metFORMIN, nitrofurantoin, oxybutynin, telmisartan, and traMADol  History Review  Allergy: [name] is allergic to sulfamethoxazole-trimethoprim and ciprofloxacin. Drug: [name]  reports no history of drug use. Alcohol:  reports no history of alcohol use. Tobacco:  reports that she has never smoked. She has never used smokeless tobacco. Social: [name]  reports that she has never smoked. She has never used smokeless tobacco. She reports that she does not drink alcohol and does not use drugs. Medical:  has a past medical history of Abdominal pain, epigastric, Acute cystitis without hematuria (04/19/2021), Anemia, Backache, Bronchitis, Calculus, kidney, Carpal tunnel syndrome, Cellulitis of face (05/30/2016), Chest pain, Chicken pox, Circulatory disease, Diabetes mellitus without complication (HCC), Disorder of kidney and ureter, Excess, menstruation, Hypercholesteremia, Hypertension, essential, benign, Infected postoperative seroma, Infected surgical wound (09/15/2008), Iron deficiency, Leg pain (02/02/2015), Malaise and fatigue, Measles, MRSA (methicillin resistant staph aureus) culture positive, Nausea alone, Nonspecific abnormal electrocardiogram (ECG) (EKG), Pain in elbow (02/05/2016), Pain in joint,  lower leg, Pain in shoulder (12/05/2011), and Sleep apnea. Surgical: [name]  has a past surgical history that includes Colonoscopy (1997); Partial hysterectomy (2009); Cesarean section; Lithotripsy (  1997); mrsa; Hernia repair (2012); Abdominal hysterectomy; Hartmann's procedure.; Colostomy takedown (2012); Colonoscopy with propofol (N/A, 01/09/2018); and Colonoscopy with propofol (N/A, 08/20/2019). Family: family history includes Diabetes in her mother and other family members; Hypertension in her father, mother, and other family members; Stroke in her father.  Laboratory Chemistry Profile   Renal Lab Results  Component Value Date   BUN 18 05/22/2022   CREATININE 1.30 (H) 07/16/2022   BCR 15 05/22/2022   GFRAA 63 06/06/2020   GFRNONAA 55 (L) 06/06/2020     Hepatic Lab Results  Component Value Date   AST 12 02/08/2022   ALT 9 02/08/2022   ALBUMIN 4.4 02/08/2022   ALKPHOS 95 02/08/2022   LIPASE 26 07/07/2017     Electrolytes Lab Results  Component Value Date   NA 141 05/22/2022   K 4.3 05/22/2022   CL 101 05/22/2022   CALCIUM 10.1 05/22/2022     Bone No results found for: "VD25OH", "VD125OH2TOT", "ZO1096EA5", "WU9811BJ4", "25OHVITD1", "25OHVITD2", "25OHVITD3", "TESTOFREE", "TESTOSTERONE"   Inflammation (CRP: Acute Phase) (ESR: Chronic Phase) No results found for: "CRP", "ESRSEDRATE", "LATICACIDVEN"     Note: Above Lab results reviewed.  General appearance: Well nourished, well developed, and well hydrated. In no apparent acute distress Mental status: Alert, oriented x 3 (person, place, & time)       Respiratory: No evidence of acute respiratory distress Eyes: PERLA Vitals: BP 120/78   Pulse 71   Temp (!) 97.5 F (36.4 C) (Temporal)   Resp 16   Ht 5\' 9"  (1.753 m)   Wt 191 lb (86.6 kg)   SpO2 97%   BMI 28.21 kg/m  BMI: Estimated body mass index is 28.21 kg/m as calculated from the following:   Height as of this encounter: 5\' 9"  (1.753 m).   Weight as of this  encounter: 191 lb (86.6 kg). Ideal: Ideal body weight: 66.2 kg (145 lb 15.1 oz) Adjusted ideal body weight: 74.4 kg (163 lb 15.5 oz)  Lumbar Spine Area Exam  Skin & Axial Inspection: No masses, redness, or swelling Alignment: Symmetrical Functional ROM: Pain restricted ROM affecting primarily the left Stability: No instability detected Muscle Tone/Strength: Functionally intact. No obvious neuro-muscular anomalies detected. Sensory (Neurological): Dermatomal pain pattern left l3, l4 Palpation: No palpable anomalies       Provocative Tests: Hyperextension/rotation test: deferred today       Lumbar quadrant test (Kemp's test): (+) on the left for foraminal stenosis Lateral bending test: (+) ipsilateral radicular pain, on the left. Positive for left-sided foraminal stenosis.   Gait & Posture Assessment  Ambulation: Unassisted Gait: Relatively normal for age and body habitus Posture: Difficulty standing up straight, due to pain  Lower Extremity Exam    Side: Right lower extremity  Side: Left lower extremity  Stability: No instability observed          Stability: No instability observed          Skin & Extremity Inspection: Skin color, temperature, and hair growth are WNL. No peripheral edema or cyanosis. No masses, redness, swelling, asymmetry, or associated skin lesions. No contractures.  Skin & Extremity Inspection: Skin color, temperature, and hair growth are WNL. No peripheral edema or cyanosis. No masses, redness, swelling, asymmetry, or associated skin lesions. No contractures.  Functional ROM: Unrestricted ROM                  Functional ROM: Pain restricted ROM for hip joint Limited SLR (straight leg raise)  Muscle Tone/Strength: Functionally intact. No  obvious neuro-muscular anomalies detected.  Muscle Tone/Strength: Functionally intact. No obvious neuro-muscular anomalies detected.  Sensory (Neurological): Unimpaired        Sensory (Neurological): Dermatomal pain pattern         DTR: Patellar: deferred today Achilles: deferred today Plantar: deferred today  DTR: Patellar: deferred today Achilles: deferred today Plantar: deferred today  Palpation: No palpable anomalies  Palpation: No palpable anomalies    Assessment   Status Diagnosis  Persistent Persistent Persistent 1. Lumbar radiculopathy (left L3/4)   2. Spinal stenosis, lumbar region, with neurogenic claudication   3. Lumbar spondylosis   4. Chronic pain syndrome         Plan of Care  [name] has a current medication list which includes the following long-term medication(s): amlodipine, atorvastatin, ibuprofen-diphenhydramine cit, metformin, telmisartan, and gabapentin.  Pharmacotherapy (Medications Ordered): Meds ordered this encounter  Medications   gabapentin (NEURONTIN) 400 MG capsule    Sig: Take 1 capsule (400 mg total) by mouth 3 (three) times daily.    Dispense:  90 capsule    Refill:  5   traMADol (ULTRAM) 50 MG tablet    Sig: Take 1 tablet (50 mg total) by mouth every 12 (twelve) hours as needed for severe pain.    Dispense:  60 tablet    Refill:  1    Fill one day early if pharmacy is closed on scheduled refill date.   Follow-up plan:   Return in about 6 months (around 04/30/2023) for Medication Management, in person.   Recent Visits No visits were found meeting these conditions. Showing recent visits within past 90 days and meeting all other requirements Today's Visits Date Type Provider Dept  10/29/22 Office Visit Edward Jolly, MD [facility]  Showing today's visits and meeting all other requirements Future Appointments No visits were found meeting these conditions. Showing future appointments within next 90 days and meeting all other requirements  I discussed the assessment and treatment plan with the patient. The patient was provided an opportunity to ask questions and all were answered. The patient agreed with the plan and demonstrated an  understanding of the instructions.  Patient advised to call back or seek an in-person evaluation if the symptoms or condition worsens.  Duration of encounter: 30 minutes.  Note by: Edward Jolly, MD Date: 10/29/2022; Time: 10:36 AM

## 2022-10-31 ENCOUNTER — Encounter: Payer: Medicare HMO | Admitting: Student in an Organized Health Care Education/Training Program

## 2022-11-13 DIAGNOSIS — E118 Type 2 diabetes mellitus with unspecified complications: Secondary | ICD-10-CM | POA: Diagnosis not present

## 2022-11-25 ENCOUNTER — Encounter: Payer: Self-pay | Admitting: Urology

## 2022-11-25 ENCOUNTER — Ambulatory Visit: Payer: Medicare HMO | Admitting: Urology

## 2022-12-03 ENCOUNTER — Telehealth: Payer: Self-pay | Admitting: Family Medicine

## 2022-12-03 NOTE — Telephone Encounter (Signed)
Received a fax from covermymeds for one touch ultra strips  Key: [id number]

## 2022-12-06 ENCOUNTER — Telehealth (INDEPENDENT_AMBULATORY_CARE_PROVIDER_SITE_OTHER): Payer: Medicare HMO | Admitting: Family Medicine

## 2022-12-06 ENCOUNTER — Ambulatory Visit: Payer: Self-pay | Admitting: *Deleted

## 2022-12-06 ENCOUNTER — Encounter: Payer: Self-pay | Admitting: Family Medicine

## 2022-12-06 DIAGNOSIS — R0989 Other specified symptoms and signs involving the circulatory and respiratory systems: Secondary | ICD-10-CM

## 2022-12-06 NOTE — Telephone Encounter (Signed)
2nd call attempted on #[phone] to review sx of cough, head pain stuffy nose. No answer, LVMTCB [phone].

## 2022-12-06 NOTE — Telephone Encounter (Signed)
  Chief Complaint: nasal congestion, stuffy nose, cough head pain Symptoms: see above Frequency: yesterday  Pertinent Negatives: Patient denies chest pain no difficulty breathing no fever Disposition: [] ED /[] Urgent Care (no appt availability in office) / [x] Appointment(In office/virtual)/ []  [place] [facility]/ [] Home Care/ [] Refused Recommended Disposition /[] [place] [facility]/ []  Follow-up with PCP Additional Notes:   My chart VV scheduled today     Reason for Disposition  [1] Sinus congestion (pressure, fullness) AND [2] present > 10 days    Started yesterday  Answer Assessment - Initial Assessment Questions 1. ONSET: "When did the nasal discharge start?"      yesterday 2. AMOUNT: "How much discharge is there?"      na 3. COUGH: "Do you have a cough?" If Yes, ask: "Describe the color of your sputum" (clear, white, yellow, green)     Yes  4. RESPIRATORY DISTRESS: "Describe your breathing."      Ok. Sounds like nasal congestion 5. FEVER: "Do you have a fever?" If Yes, ask: "What is your temperature, how was it measured, and when did it start?"     no 6. SEVERITY: "Overall, how bad are you feeling right now?" (e.g., doesn't interfere with normal activities, staying home from school/work, staying in bed)      Na  7. OTHER SYMPTOMS: "Do you have any other symptoms?" (e.g., sore throat, earache, wheezing, vomiting)     Cough nasal congestion, stuffy nose  8. PREGNANCY: "Is there any chance you are pregnant?" "When was your last menstrual period?"     na  Protocols used: Common Cold-A-AH

## 2022-12-06 NOTE — Assessment & Plan Note (Signed)
<  48 hours of symptoms No fever Continue supportive care Recommend COVID test

## 2022-12-06 NOTE — Patient Instructions (Signed)
Obtain COVID test and let us know results  Recommend OTC cough/cold medications that are blood pressure safe

## 2022-12-06 NOTE — Progress Notes (Signed)
MyChart Video Visit    Virtual Visit via Video Note   This format is felt to be most appropriate for this patient at this time. Physical exam was limited by quality of the video and audio technology used for the visit.   Patient location: home  Provider location:  [facility] [address]  [address] [place], [place] [address]   I discussed the limitations of evaluation and management by telemedicine and the availability of in person appointments. The patient expressed understanding and agreed to proceed.  Patient: Emily Stokes   DOB: Oct 06, 1960   62 y.o. Female  MRN: [id number] Visit Date: 12/06/2022  Today's healthcare provider: Jacky Kindle, FNP   No chief complaint on file.  Subjective    HPI  Cold symptoms including fatigue, watery eyes, congestion and scratchy throat. Denies fevers. Exposed to grandchildren.   Medications: Outpatient Medications Prior to Visit  Medication Sig   acetaminophen (TYLENOL) 500 MG tablet Take 500 mg by mouth every 6 (six) hours as needed.   amLODipine (NORVASC) 5 MG tablet TAKE ONE TABLET BY MOUTH EVERY MORNING   aspirin EC (ASPIRIN LOW DOSE) 81 MG tablet TAKE ONE TABLET BY MOUTH ONCE DAILY swallow whole   atorvastatin (LIPITOR) 20 MG tablet TAKE ONE TABLET BY MOUTH ONCE DAILY   Blood Glucose Monitoring Suppl DEVI 1 each by Does not apply route daily in the afternoon. May substitute to any manufacturer covered by patient's insurance.   gabapentin (NEURONTIN) 400 MG capsule Take 1 capsule (400 mg total) by mouth 3 (three) times daily.   Glucose Blood (BLOOD GLUCOSE TEST STRIPS) STRP Use to check fasting blood sugar daily. May substitute to any manufacturer covered by patient's insurance.   Ibuprofen-diphenhydrAMINE Cit (IBUPROFEN PM PO) Take by mouth at bedtime as needed.   Lancets (ONETOUCH DELICA PLUS LANCET33G) MISC USE TO check blood glucose three times daily AS DIRECTED   Lancets Misc. MISC Use to check fasting  blood sugar daily. May substitute to any manufacturer covered by patient's insurance.   metFORMIN (GLUCOPHAGE) 500 MG tablet TAKE ONE TABLET BY MOUTH AT BREAKFAST AND AT BEDTIME   nitrofurantoin (MACRODANTIN) 100 MG capsule Take 1 capsule (100 mg total) by mouth daily.   oxybutynin (DITROPAN-XL) 10 MG 24 hr tablet TAKE ONE TABLET BY MOUTH EVERYDAY AT BEDTIME   telmisartan (MICARDIS) 20 MG tablet TAKE ONE TABLET BY MOUTH ONCE DAILY   traMADol (ULTRAM) 50 MG tablet Take 1 tablet (50 mg total) by mouth every 12 (twelve) hours as needed for severe pain.   No facility-administered medications prior to visit.    Review of Systems       Objective    There were no vitals taken for this visit.       Physical Exam Constitutional:      Appearance: She is obese. She is ill-appearing.  HENT:     Ears:     Comments: Watery eyes    Nose: Congestion and rhinorrhea present.     Mouth/Throat:     Comments: C/o scratchy throat Pulmonary:     Effort: Pulmonary effort is normal.     Comments: +coughing Neurological:     General: No focal deficit present.     Mental Status: She is alert.  Psychiatric:        Mood and Affect: Mood normal.        Behavior: Behavior normal.        Thought Content: Thought content normal.  Judgment: Judgment normal.        Assessment & Plan     Problem List Items Addressed This Visit       Other   Upper respiratory symptom - Primary    <48 hours of symptoms No fever Continue supportive care Recommend COVID test      Return if symptoms worsen or fail to improve.    I discussed the assessment and treatment plan with the patient. The patient was provided an opportunity to ask questions and all were answered. The patient agreed with the plan and demonstrated an understanding of the instructions.   The patient was advised to call back or seek an in-person evaluation if the symptoms worsen or if the condition fails to improve as  anticipated.  I provided 5 minutes of face-to-face time during this encounter.  Leilani Merl, FNP, have reviewed all documentation for this visit. The documentation on 12/06/22 for the exam, diagnosis, procedures, and orders are all accurate and complete.  Jacky Kindle, FNP [facility] [phone] (phone) [phone] (fax)  [facility]

## 2022-12-06 NOTE — Telephone Encounter (Signed)
Summary: stuffy nose, cough, head pain   Stuffy nose, cough, head pain. No fever  2 days,,,        Called patient [phone] to review sx of cough, stuffy nose, head pain. No answer, LVMTCB [phone].

## 2022-12-09 ENCOUNTER — Ambulatory Visit: Payer: Self-pay | Admitting: *Deleted

## 2022-12-09 NOTE — Telephone Encounter (Signed)
Looks like they scheduled her with virtual UC for the covid issue. For DM, ok to send Ozempic 0.5 mg weekly 30 day supply with 1 refill instead of trulicity.

## 2022-12-09 NOTE — Telephone Encounter (Signed)
Summary: Covid positive, symptomatic   Pt called back to report that she tested positive for covid, her symptoms include: headache, aches, congestion, sore throat  Best contact: [phone]       Chief Complaint: positive covid test today , elevated blood glucose 304 Symptoms: tested positive with at home covid test yesterday. Sx started 12/05/22. Continues sx headache, body aches, congestion, sore throat. Chills and sweating at times. Report blood glucose 304 today. Has not taken trulicity x 3 months due to unable to get per pharmacy.  Frequency: 12/05/22 Pertinent Negatives: Patient denies chest pain no difficulty breathing no fever reported. Disposition: [] ED /[] Urgent Care (no appt availability in office) / [] Appointment(In office/virtual)/ []  [facility]/ [] Home Care/ [] Refused Recommended Disposition /[] [place]/ [x]  Follow-up with PCP Additional Notes:   Last my chart VV 12/06/22 and told to call back. Patient hx diabetes and now elevated glucose greater than 300 x 1 today. Recommended to drink more water and recheck blood glucose if remains greater than 300 call back. Patient is taking metformin. Please [facility] any [facility] for [facility] or [phone], [place]. Please advise     Reason for Disposition  [1] HIGH RISK patient (e.g., weak immune system, age > 64 years, obesity with BMI 30 or higher, pregnant, chronic lung disease or other chronic medical condition) AND [2] COVID symptoms (e.g., cough, fever)  (Exceptions: Already seen by PCP and no new or worsening symptoms.)  Answer Assessment - Initial Assessment Questions 1. COVID-19 DIAGNOSIS: "How do you know that you have COVID?" (e.g., positive lab test or self-test, diagnosed by doctor or NP/PA, symptoms after exposure).     At home test covid positive  2. COVID-19 EXPOSURE: "Was there any known exposure to COVID before the symptoms began?" CDC  Definition of close contact: within 6 feet (2 meters) for a total of 15 minutes or more over a 24-hour period.      Yes grandchildren  3. ONSET: "When did the COVID-19 symptoms start?"      Thursday 11/17/23/24 4. WORST SYMPTOM: "What is your worst symptom?" (e.g., cough, fever, shortness of breath, muscle aches)     Nasal congestion, headaches, body aches, sore throat  5. COUGH: "Do you have a cough?" If Yes, ask: "How bad is the cough?"       Na  6. FEVER: "Do you have a fever?" If Yes, ask: "What is your temperature, how was it measured, and when did it start?"     Unknown, chills at times with sweating 7. RESPIRATORY STATUS: "Describe your breathing?" (e.g., normal; shortness of breath, wheezing, unable to speak)      denies 8. BETTER-SAME-WORSE: "Are you getting better, staying the same or getting worse compared to yesterday?"  If getting worse, ask, "In what way?"     Not getting better 9. OTHER SYMPTOMS: "Do you have any other symptoms?"  (e.g., chills, fatigue, headache, loss of smell or taste, muscle pain, sore throat)     See above. Reports blood glucose elevated this am 304  10. HIGH RISK DISEASE: "Do you have any chronic medical problems?" (e.g., asthma, heart or lung disease, weak immune system, obesity, etc.)       Hx diabetes. 11. VACCINE: "Have you had the COVID-19 vaccine?" If Yes, ask: "Which one, how many shots, when did you get it?"       na 12. PREGNANCY: "Is there any chance you are pregnant?" "When was  your last menstrual period?"       na 13. O2 SATURATION MONITOR:  "Do you use an oxygen saturation monitor (pulse oximeter) at home?" If Yes, ask "What is your reading (oxygen level) today?" "What is your usual oxygen saturation reading?" (e.g., 95%)       na  Protocols used: Coronavirus (COVID-19) Diagnosed or Suspected-A-AH

## 2022-12-10 ENCOUNTER — Ambulatory Visit: Payer: Self-pay

## 2022-12-10 DIAGNOSIS — E1165 Type 2 diabetes mellitus with hyperglycemia: Secondary | ICD-10-CM

## 2022-12-10 NOTE — Telephone Encounter (Signed)
Message from Paris T sent at 12/10/2022  3:52 PM EDT  Summary: covid symptoms   Patient called stated her cough is getting worse and her head still hurts. He sugar is still up. Please f/u with patient for medication         Chief Complaint: cough keeping her up at night/ BS in 300's range.  Symptoms: severe headache, PND, congested cough, clear phlegm and nasal drainage Frequency: over 2 weeks Pertinent Negatives: Patient denies SOB, fever Disposition: [] ED /[] Urgent Care (no appt availability in office) / [] Appointment(In office/virtual)/ []  [facility]/ [] Home Care/ [] Refused Recommended Disposition /[] [facility]/ [x]  Follow-up with PCP Additional Notes: pt asking for something for cough. Still waiting on PA for Ozempic- sending note to office.   Reason for Disposition  SEVERE coughing spells (e.g., whooping sound after coughing, vomiting after coughing)    Preventing sleep  Answer Assessment - Initial Assessment Questions 1. ONSET: "When did the cough begin?"      Over 2 weeks  2. SEVERITY: "How bad is the cough today?"      Cannot sleep for coughing  3. SPUTUM: "Describe the color of your sputum" (none, dry cough; clear, white, yellow, green)     Clear  4. HEMOPTYSIS: "Are you coughing up any blood?" If so ask: "How much?" (flecks, streaks, tablespoons, etc.)     No  5. DIFFICULTY BREATHING: "Are you having difficulty breathing?" If Yes, ask: "How bad is it?" (e.g., mild, moderate, severe)    - MILD: No SOB at rest, mild SOB with walking, speaks normally in sentences, can lie down, no retractions, pulse < 100.    - MODERATE: SOB at rest, SOB with minimal exertion and prefers to sit, cannot lie down flat, speaks in phrases, mild retractions, audible wheezing, pulse 100-120.    - SEVERE: Very SOB at rest, speaks in single words, struggling to breathe, sitting hunched forward, retractions, pulse > 120      No SOB  6. FEVER: "Do you have a fever?" If Yes,  ask: "What is your temperature, how was it measured, and when did it start?"     no 10. OTHER SYMPTOMS: "Do you have any other symptoms?" (e.g., runny nose, wheezing, chest pain)       Cold chills alt hot flashes /runny nose clear / headache 9/10 taking Tylenol Nyquil helps a small amount  cold is better  Protocols used: Cough - Acute Non-Productive-A-AH

## 2022-12-11 MED ORDER — OZEMPIC (0.25 OR 0.5 MG/DOSE) 2 MG/3ML ~~LOC~~ SOPN: 0.5000 mg | PEN_INJECTOR | SUBCUTANEOUS | 1 refills | Status: DC

## 2022-12-11 NOTE — Addendum Note (Signed)
Addended by: Lily Kocher on: 12/11/2022 11:33 AM   Modules accepted: Orders

## 2022-12-11 NOTE — Telephone Encounter (Signed)
Patient advised. Verbalized understanding. Inquired about ozempic that was supposed to be sent in. Advised rx would be sent today as I do not see it on medication list but I see the authorization to send from provider. Patient would like rx [facility] [address]

## 2022-12-11 NOTE — Telephone Encounter (Signed)
Erasmo Downer, MD       12/09/22 11:47 AM Note Looks like they scheduled her with virtual UC for the covid issue. For DM, ok to send Ozempic 0.5 mg weekly 30 day supply with 1 refill instead of trulicity.

## 2022-12-13 DIAGNOSIS — E118 Type 2 diabetes mellitus with unspecified complications: Secondary | ICD-10-CM | POA: Diagnosis not present

## 2022-12-18 NOTE — Telephone Encounter (Signed)
Patient reports she is not using the onetouch ultra monitior

## 2022-12-27 DIAGNOSIS — E118 Type 2 diabetes mellitus with unspecified complications: Secondary | ICD-10-CM | POA: Diagnosis not present

## 2023-01-03 ENCOUNTER — Other Ambulatory Visit: Payer: Self-pay | Admitting: Family Medicine

## 2023-01-03 DIAGNOSIS — E1165 Type 2 diabetes mellitus with hyperglycemia: Secondary | ICD-10-CM

## 2023-01-03 NOTE — Telephone Encounter (Signed)
OBJECTIVE:Medication Refill - Medication: Semaglutide,0.25 or 0.5MG /DOS, (OZEMPIC, 0.25 OR 0.5 MG/DOSE,) 2 MG/3ML SOPN    Has the patient contacted their pharmacy? yes ((Agent: If yes, when and what did the pharmacy advise?)contact pcp  Preferred Pharmacy (with phone number or street name):  [id number] [address] Phone: [phone]  Fax: [phone]     Has the patient been seen for an appointment in the last year OR does the patient have an upcoming appointment? yes  Agent: Please be advised that RX refills may take up to 3 business days. We ask that you follow-up with your pharmacy.

## 2023-01-06 NOTE — Telephone Encounter (Signed)
Requested medications are due for refill today.  unsure  Requested medications are on the active medications list.  yes  Last refill. 12/11/2022 3 mL 1 rf  Future visit scheduled.   no  Notes to clinic.  Rx was phoned in - unsure to which pharmacy.    Requested Prescriptions  Pending Prescriptions Disp Refills   Semaglutide,0.25 or 0.5MG /DOS, (OZEMPIC, 0.25 OR 0.5 MG/DOSE,) 2 MG/3ML SOPN 3 mL 1    Sig: Inject 0.5 mg into the skin once a week.     Endocrinology:  Diabetes - GLP-1 Receptor Agonists - semaglutide Failed - 01/03/2023 12:13 PM      Failed - HBA1C in normal range and within 180 days    Hgb A1c MFr Bld  Date Value Ref Range Status  05/22/2022 6.0 (H) 4.8 - 5.6 % Final    Comment:             Prediabetes: 5.7 - 6.4          Diabetes: >6.4          Glycemic control for adults with diabetes: <7.0          Failed - Cr in normal range and within 360 days    Creatinine  Date Value Ref Range Status  03/25/2014 1.16 0.60 - 1.30 mg/dL Final   Creatinine, Ser  Date Value Ref Range Status  07/16/2022 1.30 (H) 0.44 - 1.00 mg/dL Final         Passed - Valid encounter within last 6 months    Recent Outpatient Visits           1 month ago Upper respiratory symptom   [facility] Merita Norton T, FNP   2 months ago Arthralgia of both lower legs   [facility] Ronnald Ramp, MD   6 months ago Bronchitis   [facility] Merita Norton T, FNP   7 months ago Type 2 diabetes mellitus with hyperglycemia, without long-term current use of insulin [facility])    [name] Jacky Kindle, FNP   11 months ago Annual physical exam   [facility] Jacky Kindle, FNP       Future Appointments             In 2 weeks MacDiarmid, Lorin Picket, MD [facility]

## 2023-01-07 ENCOUNTER — Other Ambulatory Visit: Payer: Self-pay | Admitting: Family Medicine

## 2023-01-07 MED ORDER — SEMAGLUTIDE (1 MG/DOSE) 4 MG/3ML ~~LOC~~ SOPN: 1.0000 mg | PEN_INJECTOR | SUBCUTANEOUS | 0 refills | Status: DC

## 2023-01-20 ENCOUNTER — Encounter: Payer: Self-pay | Admitting: Urology

## 2023-01-20 ENCOUNTER — Ambulatory Visit: Payer: Medicare PPO | Admitting: Urology

## 2023-01-20 VITALS — BP 156/90 | HR 94 | Ht 69.0 in | Wt 191.0 lb

## 2023-01-20 DIAGNOSIS — R350 Frequency of micturition: Secondary | ICD-10-CM

## 2023-01-20 LAB — MICROSCOPIC EXAMINATION: WBC, UA: >30 /HPF — AB (ref 0–5)

## 2023-01-20 LAB — URINALYSIS, COMPLETE
Bilirubin, UA: NEGATIVE
Glucose, UA: NEGATIVE
Ketones, UA: NEGATIVE
Nitrite, UA: POSITIVE — AB
Protein,UA: NEGATIVE
RBC, UA: NEGATIVE
Specific Gravity, UA: 1.015 (ref 1.005–1.030)
Urobilinogen, Ur: 0.2 mg/dL (ref 0.2–1.0)
pH, UA: 5.5 (ref 5.0–7.5)

## 2023-01-20 MED ORDER — CEPHALEXIN 250 MG PO CAPS: 250.0000 mg | ORAL_CAPSULE | Freq: Every day | ORAL | 11 refills | Status: DC

## 2023-01-20 MED ORDER — AMOXICILLIN-POT CLAVULANATE 875-125 MG PO TABS: 1.0000 | ORAL_TABLET | Freq: Two times a day (BID) | ORAL | 0 refills | Status: AC

## 2023-01-20 NOTE — Progress Notes (Unsigned)
In and Out Catheterization  Patient is present today for a I & O catheterization due to UA. Patient was cleaned and prepped in a sterile fashion with betadine . A 14FR cath was inserted no complications were noted , 200 ml of urine return was noted, urine was dark yelloow, slightly cloudy in color. A clean urine sample was collected for UA/Cuture . Bladder was drained  And catheter was removed with out difficulty.    Performed by: Vale Haven Magallon-mariche, RMA

## 2023-01-20 NOTE — Progress Notes (Unsigned)
01/20/2023 3:36 PM   [facility] Lillia Carmel 09-27-1960 [id number]  Referring provider: Jacky Kindle, FNP [address] [place],  [place] [address]  Chief Complaint  Patient presents with   Follow-up    HPI: I was consulted to assess the patient for recurrent bladder infections.  She complains of worsening urge incontinence frequency and foul-smelling urine that respond favorably to antibiotics.   At baseline she voids every 2 hours and gets up 3 times at night.  Sometimes she leaks with coughing sneezing.  Sometimes she has urge incontinence.  Most days she does not wear a pad but sometimes she does   She thinks she may be infected today   She has had a hysterectomy kidney stone   She has had positive urine cultures in the medical record     Pathophysiology of UTIs discussed. Call if renal ultrasound is normal. Call in ciprofloxacin 250 mg twice a day for 7 days. Then placed patient on nitrofurantoin 100 mg 30 x 11. Have patient come back and see me for cystoscopy in approximately 6 to 7 weeks    Today Frequency stable.  Last culture negative.  She had a lesion noted on ultrasound so I ordered a CT scan.  She had bilateral renal cortical scarring with some general atrophy of the right kidney likely from infection.  She was cleared on CT scan for renal cyst with no cancer.   Patient never took the daily Macrodantin.  Clinically not infected today but she was a bit vague   On pelvic examination she is a mild cystocele no stress incontinence.   Cystoscopy: Patient went flexible cystoscopy.  Urine was very cloudy.  There was a lot of sediment.  I looked carefully around the bladder.  It was more challenging to see the urothelium.  No carcinoma.  She has never smoked.  Patient looks like she had a bladder infection by urinalysis and cystoscopy today.  The last culture demonstrated a lot of resistance and based upon allergies I called and Augmentin 1 tablet twice a day for 7 days.  She  would then start daily Macrodantin 100 mg 30 x 11 based on allergies.  Reassess in 8 weeks.  Call if culture differs   Today Frequency stable.  Last culture positive Patient is on daily Macrodantin but now has foul-smelling urine and frequency and urgency incontinence.  She gets hives from sulfa drug.  She was cath and urine was sent for culture        PMH: Past Medical History:  Diagnosis Date   Abdominal pain, epigastric    Acute cystitis without hematuria 04/19/2021   Anemia    Backache    Bronchitis    Calculus, kidney    Carpal tunnel syndrome    Cellulitis of face 05/30/2016   Chest pain    Chicken pox    Circulatory disease    Diabetes mellitus without complication (HCC)    Disorder of kidney and ureter    Excess, menstruation    Hypercholesteremia    Hypertension, essential, benign    Infected postoperative seroma    Infected surgical wound 09/15/2008   Iron deficiency    Leg pain 02/02/2015   Malaise and fatigue    Measles    MRSA (methicillin resistant staph aureus) culture positive    Nausea alone    Nonspecific abnormal electrocardiogram (ECG) (EKG)    Pain in elbow 02/05/2016   Pain in joint, lower leg    Pain in shoulder  12/05/2011   Sleep apnea     Surgical History: Past Surgical History:  Procedure Laterality Date   ABDOMINAL HYSTERECTOMY     CESAREAN SECTION     COLONOSCOPY  1997   COLONOSCOPY WITH PROPOFOL N/A 01/09/2018   Procedure: COLONOSCOPY WITH PROPOFOL;  Surgeon: Pasty Spillers, MD;  Location: [facility] ENDOSCOPY;  Service: Endoscopy;  Laterality: N/A;   COLONOSCOPY WITH PROPOFOL N/A 08/20/2019   Procedure: COLONOSCOPY WITH PROPOFOL;  Surgeon: Midge Minium, MD;  Location: [facility] ENDOSCOPY;  Service: Endoscopy;  Laterality: N/A;   COLOSTOMY TAKEDOWN  2012   Hartmann's procedure.     HERNIA REPAIR  2012   umbilical   LITHOTRIPSY  1997   with complications, hospitalized due to these complications for 3 months   mrsa     removal on nose    PARTIAL HYSTERECTOMY  2009    vaginal hysterectomy, has both ovaries    Home Medications:  Allergies as of 01/20/2023       Reactions   Sulfamethoxazole-trimethoprim Swelling, Hives   Ciprofloxacin Itching, Rash   Swollen around eyes and mouth.         Medication List        Accurate as of January 20, 2023  3:36 PM. If you have any questions, ask your nurse or doctor.          acetaminophen 500 MG tablet Commonly known as: TYLENOL Take 500 mg by mouth every 6 (six) hours as needed.   amLODipine 5 MG tablet Commonly known as: NORVASC TAKE ONE TABLET BY MOUTH EVERY MORNING   aspirin EC 81 MG tablet Commonly known as: Aspirin Low Dose TAKE ONE TABLET BY MOUTH ONCE DAILY swallow whole   atorvastatin 20 MG tablet Commonly known as: LIPITOR TAKE ONE TABLET BY MOUTH ONCE DAILY   Blood Glucose Monitoring Suppl Devi 1 each by Does not apply route daily in the afternoon. May substitute to any manufacturer covered by patient's insurance.   BLOOD GLUCOSE TEST STRIPS Strp Use to check fasting blood sugar daily. May substitute to any manufacturer covered by patient's insurance.   gabapentin 400 MG capsule Commonly known as: Neurontin Take 1 capsule (400 mg total) by mouth 3 (three) times daily.   IBUPROFEN PM PO Take by mouth at bedtime as needed.   Lancets Misc. Misc Use to check fasting blood sugar daily. May substitute to any manufacturer covered by patient's insurance.   metFORMIN 500 MG tablet Commonly known as: GLUCOPHAGE TAKE ONE TABLET BY MOUTH AT BREAKFAST AND AT BEDTIME   nitrofurantoin 100 MG capsule Commonly known as: MACRODANTIN Take 1 capsule (100 mg total) by mouth daily.   OneTouch Delica Plus Lancet33G Misc USE TO check blood glucose three times daily AS DIRECTED   oxybutynin 10 MG 24 hr tablet Commonly known as: DITROPAN-XL TAKE ONE TABLET BY MOUTH EVERYDAY AT BEDTIME   Semaglutide (1 MG/DOSE) 4 MG/3ML Sopn Inject 1 mg as directed once a  week.   telmisartan 20 MG tablet Commonly known as: MICARDIS TAKE ONE TABLET BY MOUTH ONCE DAILY   Trulicity 3 MG/0.5ML Sopn Generic drug: Dulaglutide Inject into the skin.        Allergies:  Allergies  Allergen Reactions   Sulfamethoxazole-Trimethoprim Swelling and Hives   Ciprofloxacin Itching and Rash    Swollen around eyes and mouth.     Family History: Family History  Problem Relation Age of Onset   Hypertension Father    Stroke Father    Diabetes Mother  Hypertension Mother    Diabetes Other        sibling   Diabetes Other        sibling   Diabetes Other        sibling   Hypertension Other        sibling   Hypertension Other        sibling   Hypertension Other        sibling    Social History:  reports that she has never smoked. She has never used smokeless tobacco. She reports that she does not drink alcohol and does not use drugs.  ROS:                                        Physical Exam: BP (!) 156/90   Pulse 94   Ht 5\' 9"  (1.753 m)   Wt 86.6 kg   BMI 28.21 kg/m   Constitutional:  Alert and oriented, No acute distress. HEENT: [place] AT, moist mucus membranes.  Trachea midline, no masses.   Laboratory Data: Lab Results  Component Value Date   WBC 7.3 05/22/2022   HGB 12.6 05/22/2022   HCT 36.6 05/22/2022   MCV 93 05/22/2022   PLT 354 05/22/2022    Lab Results  Component Value Date   CREATININE 1.30 (H) 07/16/2022    No results found for: "PSA"  No results found for: "TESTOSTERONE"  Lab Results  Component Value Date   HGBA1C 6.0 (H) 05/22/2022    Urinalysis    Component Value Date/Time   COLORURINE YELLOW 07/07/2017 1715   APPEARANCEUR Cloudy (A) 08/05/2022 1445   LABSPEC 1.009 07/07/2017 1715   LABSPEC 1.015 03/25/2014 0925   PHURINE 5.0 07/07/2017 1715   GLUCOSEU Negative 08/05/2022 1445   GLUCOSEU NEGATIVE 03/25/2014 0925   HGBUR MODERATE (A) 07/07/2017 1715   BILIRUBINUR Negative 08/05/2022  1445   BILIRUBINUR NEGATIVE 03/25/2014 0925   KETONESUR NEGATIVE 07/07/2017 1715   PROTEINUR Negative 08/05/2022 1445   PROTEINUR NEGATIVE 07/07/2017 1715   UROBILINOGEN 0.2 12/19/2021 0945   NITRITE Positive (A) 08/05/2022 1445   NITRITE POSITIVE (A) 07/07/2017 1715   LEUKOCYTESUR 2+ (A) 08/05/2022 1445   LEUKOCYTESUR NEGATIVE 03/25/2014 0925    Pertinent Imaging:   Assessment & Plan: Urine reviewed and sent for culture.  She has allergies as noted.  Based upon last culture I called and Augmentin 1 tablet twice a day for 7 days.  She would then go on daily Keflex 250 mg daily 30 x 11 and reassess 3 months  1. Urinary frequency  - Urinalysis, Complete   No follow-ups on file.  Martina Sinner, MD  [facility] [address], [address] [place], [place] [address] [phone]

## 2023-01-23 DIAGNOSIS — E118 Type 2 diabetes mellitus with unspecified complications: Secondary | ICD-10-CM | POA: Diagnosis not present

## 2023-01-23 LAB — CULTURE, URINE COMPREHENSIVE

## 2023-01-27 DIAGNOSIS — E118 Type 2 diabetes mellitus with unspecified complications: Secondary | ICD-10-CM | POA: Diagnosis not present

## 2023-01-28 ENCOUNTER — Other Ambulatory Visit: Payer: Self-pay | Admitting: Family Medicine

## 2023-01-28 DIAGNOSIS — G894 Chronic pain syndrome: Secondary | ICD-10-CM

## 2023-01-28 DIAGNOSIS — M48062 Spinal stenosis, lumbar region with neurogenic claudication: Secondary | ICD-10-CM

## 2023-01-28 DIAGNOSIS — E1165 Type 2 diabetes mellitus with hyperglycemia: Secondary | ICD-10-CM

## 2023-01-28 DIAGNOSIS — M5416 Radiculopathy, lumbar region: Secondary | ICD-10-CM

## 2023-01-28 DIAGNOSIS — E119 Type 2 diabetes mellitus without complications: Secondary | ICD-10-CM

## 2023-01-28 DIAGNOSIS — E1159 Type 2 diabetes mellitus with other circulatory complications: Secondary | ICD-10-CM

## 2023-01-28 DIAGNOSIS — E7800 Pure hypercholesterolemia, unspecified: Secondary | ICD-10-CM

## 2023-01-28 NOTE — Telephone Encounter (Signed)
Medication Refill - Medication: metFORMIN (GLUCOPHAGE) 500 MG tablet amLODipine (NORVASC) 5 MG tablet atorvastatin (LIPITOR) 20 MG tablet telmisartan (MICARDIS) 20 MG tablet gabapentin (NEURONTIN)  400 MG capsule Lancets (ONETOUCH DELICA PLUS LANCET33G) MISC   Has the patient contacted their pharmacy? Yes.   Pt [facility] When she went to the pharmacy to pick up her medications they only had her Asprin and Trulicity that cas called in for her.      Preferred Pharmacy (with phone number or street name): [id number] [place] [address]  Phone: [phone] Fax: [phone]  Has the patient been seen for an appointment in the last year OR does the patient have an upcoming appointment? Yes.    Agent: Please be advised that RX refills may take up to 3 business days. We ask that you follow-up with your pharmacy.

## 2023-01-30 MED ORDER — AMLODIPINE BESYLATE 5 MG PO TABS: 5.0000 mg | ORAL_TABLET | Freq: Every morning | ORAL | 0 refills | Status: DC

## 2023-01-30 MED ORDER — METFORMIN HCL 500 MG PO TABS: 500.0000 mg | ORAL_TABLET | Freq: Every day | ORAL | 0 refills | Status: DC

## 2023-01-30 MED ORDER — ONETOUCH DELICA PLUS LANCET33G MISC: 0 refills | Status: DC

## 2023-01-30 MED ORDER — TELMISARTAN 20 MG PO TABS: 20.0000 mg | ORAL_TABLET | Freq: Every day | ORAL | 0 refills | Status: DC

## 2023-01-30 MED ORDER — GABAPENTIN 400 MG PO CAPS: 400.0000 mg | ORAL_CAPSULE | Freq: Three times a day (TID) | ORAL | 0 refills | Status: DC

## 2023-01-30 MED ORDER — ATORVASTATIN CALCIUM 20 MG PO TABS: 20.0000 mg | ORAL_TABLET | Freq: Every day | ORAL | 0 refills | Status: DC

## 2023-01-30 NOTE — Telephone Encounter (Signed)
Requested Prescriptions  Pending Prescriptions Disp Refills   amLODipine (NORVASC) 5 MG tablet 90 tablet 0    Sig: Take 1 tablet (5 mg total) by mouth every morning.     Cardiovascular: Calcium Channel Blockers 2 Failed - 01/28/2023  5:34 PM      Failed - Last BP in normal range    BP Readings from Last 1 Encounters:  01/20/23 (!) 156/90         Passed - Last Heart Rate in normal range    Pulse Readings from Last 1 Encounters:  01/20/23 94         Passed - Valid encounter within last 6 months    Recent Outpatient Visits           1 month ago Upper respiratory symptom   [place] [facility] Merita Norton T, FNP   3 months ago Arthralgia of both lower legs   [facility] Simmons-Robinson, [place], MD   6 months ago Bronchitis   [facility] Merita Norton T, FNP   8 months ago Type 2 diabetes mellitus with hyperglycemia, without long-term current use of insulin [facility])   [place] [facility] Mer[facility] Norton T, FNP   11 months ago Annual physical exam   [facility] T, FNP       Future Appointments             In 1 month MacDiarmid, Lorin Picket, MD [facility]              metFORMIN (GLUCOPHAGE) 500 MG tablet 90 tablet 0    Sig: Take 1 tablet (500 mg total) by mouth daily with breakfast.     Endocrinology:  Diabetes - Biguanides Failed - 01/28/2023  5:34 PM      Failed - Cr in normal range and within 360 days    Creatinine  Date Value Ref Range Status  03/25/2014 1.16 0.60 - 1.30 mg/dL Final   Creatinine, Ser  Date Value Ref Range Status  07/16/2022 1.30 (H) 0.44 - 1.00 mg/dL Final         Failed - HBA1C is between 0 and 7.9 and within 180 days    Hgb A1c MFr Bld  Date Value Ref Range Status  05/22/2022 6.0 (H) 4.8 - 5.6 % Final    Comment:             Prediabetes: 5.7 - 6.4          Diabetes: >6.4          Glycemic control for  adults with diabetes: <7.0          Failed - eGFR in normal range and within 360 days    EGFR (African American)  Date Value Ref Range Status  03/25/2014 >60 >51mL/min Final   GFR calc Af Amer  Date Value Ref Range Status  06/06/2020 63 >59 mL/min/1.73 Final    Comment:    **In accordance with recommendations from the NKF-ASN Task force,**   [facility] is in the process of updating its eGFR calculation to the   2021 CKD-EPI creatinine equation that estimates kidney function   without a race variable.    EGFR (Non-African Amer.)  Date Value Ref Range Status  03/25/2014 52 (L) >49mL/min Final    Comment:    eGFR values <39mL/min/1.73 m2 may be an indication of chronic kidney disease (CKD). Calculated eGFR, using the MRDR Study equation,  is useful in  patients with stable renal function. The eGFR calculation will not be reliable in acutely ill patients when serum creatinine is changing rapidly. It is not useful in patients on dialysis. The eGFR calculation may not be applicable to patients at the low and high extremes of body sizes, pregnant women, and vegetarians.    GFR calc non Af Amer  Date Value Ref Range Status  06/06/2020 55 (L) >59 mL/min/1.73 Final   eGFR  Date Value Ref Range Status  05/22/2022 51 (L) >59 mL/min/1.73 Final         Failed - B12 Level in normal range and within 720 days    No results found for: "VITAMINB12"       Passed - Valid encounter within last 6 months    Recent Outpatient Visits           1 month ago Upper respiratory symptom   [place] [facility] Merita Norton T, FNP   3 months ago Arthralgia of both lower legs   [place] [facility] Simmons-Robinson, [place], MD   6 months ago Bronchitis   [place] [facility] Mer[facility] Norton T, FNP   8 months ago Type 2 diabetes mellitus with hyperglycemia, without long-term current use of insulin [facility])   [place]  Merita Norton T, FNP   11 months ago Annual physical exam   [facility] Mer[facility] Norton T, FNP       Future Appointments             In 1 month MacDiarmid, Lorin Picket, MD [facility] [place]            Passed - CBC within normal limits and completed in the last 12 months    WBC  Date Value Ref Range Status  05/22/2022 7.3 3.4 - 10.8 x10E3/uL Final  07/07/2017 13.0 (H) 4.0 - 10.5 K/uL Final   RBC  Date Value Ref Range Status  05/22/2022 3.93 3.77 - 5.28 x10E6/uL Final  07/07/2017 3.99 3.87 - 5.11 MIL/uL Final   Hemoglobin  Date Value Ref Range Status  05/22/2022 12.6 11.1 - 15.9 g/dL Final   Hematocrit  Date Value Ref Range Status  05/22/2022 36.6 34.0 - 46.6 % Final   MCHC  Date Value Ref Range Status  05/22/2022 34.4 31.5 - 35.7 g/dL Final  16/02/9603 54.0 30.0 - 36.0 g/dL Final   [facility]  Date Value Ref Range Status  05/22/2022 32.1 26.6 - 33.0 pg Final  07/07/2017 32.1 26.0 - 34.0 pg Final   MCV  Date Value Ref Range Status  05/22/2022 93 79 - 97 fL Final  03/25/2014 98 80 - 100 fL Final   No results found for: "PLTCOUNTKUC", "LABPLAT", "POCPLA" RDW  Date Value Ref Range Status  05/22/2022 12.1 11.7 - 15.4 % Final  03/25/2014 13.3 11.5 - 14.5 % Final          atorvastatin (LIPITOR) 20 MG tablet 90 tablet 0    Sig: Take 1 tablet (20 mg total) by mouth daily.     Cardiovascular:  Antilipid - Statins Failed - 01/28/2023  5:34 PM      Failed - Lipid Panel in normal range within the last 12 months    Cholesterol, Total  Date Value Ref Range Status  02/08/2022 126 100 - 199 mg/dL Final   LDL Chol Calc (NIH)  Date Value Ref Range Status  02/08/2022 83 0 - 99 mg/dL  Final   HDL  Date Value Ref Range Status  02/08/2022 28 (L) >39 mg/dL Final   Triglycerides  Date Value Ref Range Status  02/08/2022 73 0 - 149 mg/dL Final         Passed - Patient is not pregnant      Passed - Valid encounter within last 12 months     Recent Outpatient Visits           1 month ago Upper respiratory symptom   [facility] T, FNP   3 months ago Arthralgia of both lower legs   [facility] Simmons-Robinson, [place], MD   6 months ago Bronchitis   [facility], FNP   8 months ago Type 2 diabetes mellitus with hyperglycemia, without long-term current use of insulin [facility])   [place] [facility] Jacky Kindle, FNP   11 months ago Annual physical exam   [facility] Mer[facility] Norton T, FNP       Future Appointments             In 1 month MacDiarmid, Lorin Picket, MD [facility] [place]             telmisartan (MICARDIS) 20 MG tablet 90 tablet 0    Sig: Take 1 tablet (20 mg total) by mouth daily.     Cardiovascular:  Angiotensin Receptor Blockers Failed - 01/28/2023  5:34 PM      Failed - Cr in normal range and within 180 days    Creatinine  Date Value Ref Range Status  03/25/2014 1.16 0.60 - 1.30 mg/dL Final   Creatinine, Ser  Date Value Ref Range Status  07/16/2022 1.30 (H) 0.44 - 1.00 mg/dL Final         Failed - K in normal range and within 180 days    Potassium  Date Value Ref Range Status  05/22/2022 4.3 3.5 - 5.2 mmol/L Final  03/25/2014 3.8 3.5 - 5.1 mmol/L Final         Failed - Last BP in normal range    BP Readings from Last 1 Encounters:  01/20/23 (!) 156/90         Passed - Patient is not pregnant      Passed - Valid encounter within last 6 months    Recent Outpatient Visits           1 month ago Upper respiratory symptom   [employer] [facility], FNP   3 months ago Arthralgia of both lower legs   [place] [facility] Ronnald Ramp, MD   6 months ago Bronchitis   [facility], FNP   8 months ago Type 2 diabetes mellitus with  hyperglycemia, without long-term current use of insulin [facility])   [place] [facility] Jacky Kindle, FNP   11 months ago Annual physical exam   [facility] Mer[facility] Norton T, FNP       Future Appointments             In 1 month MacDiarmid, Lorin Picket, MD [facility] [place]             gabapentin (NEURONTIN) 400 MG capsule 90 capsule 0    Sig: Take 1 capsule (400 mg total) by mouth 3 (three) times daily.     Neurology: Anticonvulsants -  gabapentin Failed - 01/28/2023  5:34 PM      Failed - Cr in normal range and within 360 days    Creatinine  Date Value Ref Range Status  03/25/2014 1.16 0.60 - 1.30 mg/dL Final   Creatinine, Ser  Date Value Ref Range Status  07/16/2022 1.30 (H) 0.44 - 1.00 mg/dL Final         Passed - Completed PHQ-2 or PHQ-9 in the last 360 days      Passed - Valid encounter within last 12 months    Recent Outpatient Visits           1 month ago Upper respiratory symptom   [facility], FNP   3 months ago Arthralgia of both lower legs   [facility] Simmons-Robinson, [place], MD   6 months ago Bronchitis   [facility] T, FNP   8 months ago Type 2 diabetes mellitus with hyperglycemia, without long-term current use of insulin [facility])   [facility] Jacky Kindle, FNP   11 months ago Annual physical exam   [facility], FNP       Future Appointments             In 1 month MacDiarmid, Lorin Picket, MD [facility] [place]             Lancets [facility] DELICA PLUS [place]) MISC 100 each 0    Sig: USE TO check blood glucose three times daily AS DIRECTED     Endocrinology: Diabetes - Testing Supplies Passed - 01/28/2023  5:34 PM      Passed - Valid encounter within last 12 months    Recent Outpatient Visits            1 month ago Upper respiratory symptom   [place] [facility] Merita Norton T, FNP   3 months ago Arthralgia of both lower legs   [place] [facility] Simmons-Robinson, [place], MD   6 months ago Bronchitis   [place] [facility] Mer[facility] T, FNP   8 months ago Type 2 diabetes mellitus with hyperglycemia, without long-term current use of insulin [facility])    [facility] Jack[facility] Kindle, FNP   11 months ago Annual physical exam   [facility], FNP       Future Appointments             In 1 month MacDiarmid, Lorin Picket, MD [facility]

## 2023-02-20 ENCOUNTER — Other Ambulatory Visit: Payer: Self-pay

## 2023-02-20 NOTE — Patient Outreach (Signed)
Care Management   Visit Note  02/20/2023 Name: [name] MRN: [id number] DOB: Jul 05, 1960  Subjective: Emily Stokes is a 62 y.o. year old female who is a primary care patient of Jacky Kindle, FNP. The Care Management team was consulted for assistance.      Engaged with Emily Stokes a telephone today. Requested to reschedule planned outreach due to recent loss in the family Will schedule outreach for 03/06/23. [name] agreed to call if assistance is required prior to outreach.   PLAN:  Will follow up on 03/06/23.   [facility]  [facility] RN Care Manager Direct Dial: [phone] Website: [email or web address]

## 2023-02-23 DIAGNOSIS — E118 Type 2 diabetes mellitus with unspecified complications: Secondary | ICD-10-CM | POA: Diagnosis not present

## 2023-02-26 DIAGNOSIS — E118 Type 2 diabetes mellitus with unspecified complications: Secondary | ICD-10-CM | POA: Diagnosis not present

## 2023-03-06 ENCOUNTER — Other Ambulatory Visit: Payer: Self-pay

## 2023-03-10 NOTE — Patient Outreach (Signed)
Care Management   Visit Note   Name: Emily Stokes MRN: [id number] DOB: [birth year]  Subjective: Emily Stokes is a 62 y.o. year old female who is a primary care patient of Jacky Kindle, FNP. The Care Management team was consulted for assistance.      Engaged with patient via telephone.  Assessment:  Outpatient Encounter Medications as of 03/06/2023  Medication Sig Note   acetaminophen (TYLENOL) 500 MG tablet Take 500 mg by mouth every 6 (six) hours as needed.    amLODipine (NORVASC) 5 MG tablet Take 1 tablet (5 mg total) by mouth every morning.    aspirin EC (ASPIRIN LOW DOSE) 81 MG tablet TAKE ONE TABLET BY MOUTH ONCE DAILY swallow whole    atorvastatin (LIPITOR) 20 MG tablet Take 1 tablet (20 mg total) by mouth daily.    cephALEXin (KEFLEX) 250 MG capsule Take 1 capsule (250 mg total) by mouth daily.    gabapentin (NEURONTIN) 400 MG capsule Take 1 capsule (400 mg total) by mouth 3 (three) times daily.    Ibuprofen-diphenhydrAMINE Cit (IBUPROFEN PM PO) Take by mouth at bedtime as needed.    metFORMIN (GLUCOPHAGE) 500 MG tablet Take 1 tablet (500 mg total) by mouth daily with breakfast.    telmisartan (MICARDIS) 20 MG tablet Take 1 tablet (20 mg total) by mouth daily.    TRULICITY 3 MG/0.5ML SOPN Inject into the skin.    Blood Glucose Monitoring Suppl DEVI 1 each by Does not apply route daily in the afternoon. May substitute to any manufacturer covered by patient's insurance.    Glucose Blood (BLOOD GLUCOSE TEST STRIPS) STRP Use to check fasting blood sugar daily. May substitute to any manufacturer covered by patient's insurance.    Lancets (ONETOUCH DELICA PLUS LANCET33G) MISC USE TO check blood glucose three times daily AS DIRECTED    Lancets Misc. MISC Use to check fasting blood sugar daily. May substitute to any manufacturer covered by patient's insurance.    oxybutynin (DITROPAN-XL) 10 MG 24 hr tablet TAKE ONE TABLET BY MOUTH EVERYDAY AT BEDTIME (Patient not taking: Reported on  03/06/2023) 03/06/2023: Reports medication was discontinued by Urologist   Semaglutide, 1 MG/DOSE, 4 MG/3ML SOPN Inject 1 mg as directed once a week. (Patient not taking: Reported on 03/06/2023)    No facility-administered encounter medications on file as of 03/06/2023.

## 2023-03-17 ENCOUNTER — Encounter: Payer: Self-pay | Admitting: *Deleted

## 2023-03-24 ENCOUNTER — Ambulatory Visit: Payer: Medicare PPO | Admitting: Family Medicine

## 2023-03-24 ENCOUNTER — Ambulatory Visit: Payer: Medicare PPO | Admitting: Urology

## 2023-03-25 ENCOUNTER — Other Ambulatory Visit: Payer: Self-pay | Admitting: Family Medicine

## 2023-03-25 ENCOUNTER — Ambulatory Visit: Payer: Medicare PPO | Admitting: Family Medicine

## 2023-03-25 DIAGNOSIS — R350 Frequency of micturition: Secondary | ICD-10-CM

## 2023-03-25 NOTE — Telephone Encounter (Signed)
Medication Refill -  Most Recent Primary Care Visit:  Provider: Merita Norton T  Department: [facility]  Visit Type: MYCHART VIDEO VISIT  Date: 12/06/2022  Medication:  TRULICITY 3 MG/0.5ML   Has the patient contacted their pharmacy? No  Is this the correct pharmacy for this prescription? Yes If no, delete pharmacy and type the correct one.  This is the patient's preferred pharmacy: [id number] - [address] [place] [address] Phone: [phone] Fax: [phone]  Has the prescription been filled recently? Yes  Is the patient out of the medication? Yes  Has the patient been seen for an appointment in the last year OR does the patient have an upcoming appointment? Yes  Can we respond through MyChart? Yes  Agent: Please be advised that Rx refills may take up to 3 business days. We ask that you follow-up with your pharmacy.

## 2023-03-26 DIAGNOSIS — E118 Type 2 diabetes mellitus with unspecified complications: Secondary | ICD-10-CM | POA: Diagnosis not present

## 2023-03-26 NOTE — Telephone Encounter (Signed)
Requested medication (s) are due for refill today: yes  Requested medication (s) are on the active medication list: yes  Last refill:  01/20/23  Future visit scheduled: yes  Notes to clinic:  Unable to refill per protocol, last refill by another provider.      Requested Prescriptions  Pending Prescriptions Disp Refills   TRULICITY 3 MG/0.5ML SOAJ      Sig: Inject into the skin.     Endocrinology:  Diabetes - GLP-1 Receptor Agonists Failed - 03/25/2023  8:39 AM      Failed - HBA1C is between 0 and 7.9 and within 180 days    Hgb A1c MFr Bld  Date Value Ref Range Status  05/22/2022 6.0 (H) 4.8 - 5.6 % Final    Comment:             Prediabetes: 5.7 - 6.4          Diabetes: >6.4          Glycemic control for adults with diabetes: <7.0          Passed - Valid encounter within last 6 months    Recent Outpatient Visits           3 months ago Upper respiratory symptom   [place] Mer[facility] T, FNP   4 months ago Arthralgia of both lower legs   [facility] Ronnald Ramp, MD   8 months ago Bronchitis   [facility], FNP   10 months ago Type 2 diabetes mellitus with hyperglycemia, without long-term current use of insulin [facility])   [facility] Merita Norton T, FNP   1 year ago Annual physical exam   [facility] Jacky Kindle, FNP       Future Appointments             In 1 week Jacky Kindle, FNP [facility]   In 2 months MacDiarmid, Lorin Picket, MD [facility]

## 2023-03-31 ENCOUNTER — Other Ambulatory Visit: Payer: Self-pay | Admitting: Family Medicine

## 2023-03-31 ENCOUNTER — Other Ambulatory Visit: Payer: Self-pay

## 2023-03-31 DIAGNOSIS — R350 Frequency of micturition: Secondary | ICD-10-CM

## 2023-03-31 NOTE — Patient Outreach (Unsigned)
  Care Management   Visit Note  03/31/2023 Name: Emily Stokes MRN: [id number] DOB: May 13, 1961  Subjective: Emily Stokes is a 62 y.o. year old female who is a primary care patient of Jacky Kindle, FNP. The Care Management team was consulted for assistance.      Engaged with patient via telephone  Assessment:  Outpatient Encounter Medications as of 03/31/2023  Medication Sig Note   gabapentin (NEURONTIN) 400 MG capsule Take 1 capsule (400 mg total) by mouth 3 (three) times daily.    metFORMIN (GLUCOPHAGE) 500 MG tablet Take 1 tablet (500 mg total) by mouth daily with breakfast.    telmisartan (MICARDIS) 20 MG tablet Take 1 tablet (20 mg total) by mouth daily.    acetaminophen (TYLENOL) 500 MG tablet Take 500 mg by mouth every 6 (six) hours as needed.    amLODipine (NORVASC) 5 MG tablet Take 1 tablet (5 mg total) by mouth every morning.    aspirin EC (ASPIRIN LOW DOSE) 81 MG tablet TAKE ONE TABLET BY MOUTH ONCE DAILY swallow whole    atorvastatin (LIPITOR) 20 MG tablet Take 1 tablet (20 mg total) by mouth daily.    Blood Glucose Monitoring Suppl DEVI 1 each by Does not apply route daily in the afternoon. May substitute to any manufacturer covered by patient's insurance.    cephALEXin (KEFLEX) 250 MG capsule Take 1 capsule (250 mg total) by mouth daily. (Patient not taking: Reported on 03/31/2023) 03/31/2023: Reports completing dose   Glucose Blood (BLOOD GLUCOSE TEST STRIPS) STRP Use to check fasting blood sugar daily. May substitute to any manufacturer covered by patient's insurance.    Ibuprofen-diphenhydrAMINE Cit (IBUPROFEN PM PO) Take by mouth at bedtime as needed.    Lancets (ONETOUCH DELICA PLUS LANCET33G) MISC USE TO check blood glucose three times daily AS DIRECTED    Lancets Misc. MISC Use to check fasting blood sugar daily. May substitute to any manufacturer covered by patient's insurance.    oxybutynin (DITROPAN-XL) 10 MG 24 hr tablet TAKE ONE TABLET BY MOUTH EVERYDAY AT  BEDTIME (Patient not taking: Reported on 03/06/2023) 03/06/2023: Reports medication was discontinued by Urologist   Semaglutide, 1 MG/DOSE, 4 MG/3ML SOPN Inject 1 mg as directed once a week. (Patient not taking: Reported on 03/06/2023)    TRULICITY 3 MG/0.5ML SOPN Inject into the skin. 03/31/2023: [facility]eds [facility]   No facility-administered encounter medications on file as of 03/31/2023.

## 2023-03-31 NOTE — Patient Instructions (Signed)
Thank you for allowing the Care Management team to participate in your care.  It was great speaking with you today!

## 2023-04-01 NOTE — Telephone Encounter (Signed)
Last refill sept. Verified by pharmacy.

## 2023-04-01 NOTE — Telephone Encounter (Signed)
Last refill by historical provider, please advise

## 2023-04-02 DIAGNOSIS — E118 Type 2 diabetes mellitus with unspecified complications: Secondary | ICD-10-CM | POA: Diagnosis not present

## 2023-04-03 ENCOUNTER — Ambulatory Visit: Payer: Medicare PPO | Admitting: Family Medicine

## 2023-04-03 ENCOUNTER — Encounter: Payer: Self-pay | Admitting: Family Medicine

## 2023-04-03 VITALS — BP 131/82 | HR 80 | Resp 16 | Ht 69.0 in | Wt 200.9 lb

## 2023-04-03 DIAGNOSIS — Z7985 Long-term (current) use of injectable non-insulin antidiabetic drugs: Secondary | ICD-10-CM | POA: Diagnosis not present

## 2023-04-03 DIAGNOSIS — G894 Chronic pain syndrome: Secondary | ICD-10-CM | POA: Diagnosis not present

## 2023-04-03 DIAGNOSIS — M5442 Lumbago with sciatica, left side: Secondary | ICD-10-CM | POA: Diagnosis not present

## 2023-04-03 DIAGNOSIS — Z23 Encounter for immunization: Secondary | ICD-10-CM

## 2023-04-03 DIAGNOSIS — E119 Type 2 diabetes mellitus without complications: Secondary | ICD-10-CM

## 2023-04-03 DIAGNOSIS — E785 Hyperlipidemia, unspecified: Secondary | ICD-10-CM

## 2023-04-03 DIAGNOSIS — G8929 Other chronic pain: Secondary | ICD-10-CM | POA: Diagnosis not present

## 2023-04-03 DIAGNOSIS — M5416 Radiculopathy, lumbar region: Secondary | ICD-10-CM | POA: Diagnosis not present

## 2023-04-03 DIAGNOSIS — M48062 Spinal stenosis, lumbar region with neurogenic claudication: Secondary | ICD-10-CM

## 2023-04-03 DIAGNOSIS — N1831 Chronic kidney disease, stage 3a: Secondary | ICD-10-CM

## 2023-04-03 DIAGNOSIS — E1165 Type 2 diabetes mellitus with hyperglycemia: Secondary | ICD-10-CM

## 2023-04-03 DIAGNOSIS — E1169 Type 2 diabetes mellitus with other specified complication: Secondary | ICD-10-CM

## 2023-04-03 DIAGNOSIS — M5441 Lumbago with sciatica, right side: Secondary | ICD-10-CM | POA: Diagnosis not present

## 2023-04-03 DIAGNOSIS — Z1231 Encounter for screening mammogram for malignant neoplasm of breast: Secondary | ICD-10-CM

## 2023-04-03 MED ORDER — TELMISARTAN 20 MG PO TABS: 20.0000 mg | ORAL_TABLET | Freq: Every day | ORAL | 0 refills | Status: DC

## 2023-04-03 MED ORDER — METFORMIN HCL 500 MG PO TABS: 500.0000 mg | ORAL_TABLET | Freq: Every day | ORAL | 0 refills | Status: DC

## 2023-04-03 MED ORDER — GABAPENTIN 300 MG PO CAPS: 900.0000 mg | ORAL_CAPSULE | Freq: Three times a day (TID) | ORAL | 11 refills | Status: DC

## 2023-04-03 NOTE — Patient Instructions (Signed)
The CDC recommends two doses of Shingrix (the new shingles vaccine) separated by 2 to 6 months for adults age 63 years and older. I recommend checking with your insurance plan regarding coverage for this vaccine.    

## 2023-04-03 NOTE — Progress Notes (Signed)
Established patient visit   Patient: Emily Stokes   DOB: 08/04/1960   62 y.o. Female  MRN: [id number] Visit Date: 04/03/2023  Today's healthcare provider: Jacky Kindle, FNP  Re Introduced to nurse practitioner role and practice setting.  All questions answered.  Discussed provider/patient relationship and expectations.  Chief Complaint  Patient presents with   Leg Pain    Changes to sensation in feet and toes   Subjective    HPI HPI     Leg Pain    Additional comments: Changes to sensation in feet and toes      Last edited by Marjie Skiff, CMA on 04/03/2023  2:44 PM.      The patient, with a history of diabetes and hypertension, presents with worsening symptoms on the left side. The patient has not reached out to her specialist, Dr. Cherylann Ratel, about this issue but has an appointment scheduled in December. The patient has also experienced a few falls recently but it has been a while since the last one. The patient's blood pressure was elevated at the clinic visit, but is usually fine at home. The patient reports increased pain today, particularly in the leg.  The patient also reports ongoing urinary tract infections and is seeing Dr. Jacquelyne Balint for this issue. The cause of the infections is still being investigated with MRI and ultrasound. The patient is also dealing with chronic pain and is on gabapentin, but does not feel it is helping.  The patient's diabetes is well controlled with Trulicity and metformin. The patient is due for refills on these medications as well as her blood pressure medication, telmisartan. The patient is also due for a flu vaccine, shingles vaccine, and a mammogram.  Medications: Outpatient Medications Prior to Visit  Medication Sig   acetaminophen (TYLENOL) 500 MG tablet Take 500 mg by mouth every 6 (six) hours as needed.   amLODipine (NORVASC) 5 MG tablet Take 1 tablet (5 mg total) by mouth every morning.   aspirin EC (ASPIRIN LOW DOSE) 81  MG tablet TAKE ONE TABLET BY MOUTH ONCE DAILY swallow whole   Blood Glucose Monitoring Suppl DEVI 1 each by Does not apply route daily in the afternoon. May substitute to any manufacturer covered by patient's insurance.   Dulaglutide (TRULICITY) 3 MG/0.5ML SOAJ INJECT 3MG  INTO THE SKIN ONCE EACH WEEK   Glucose Blood (BLOOD GLUCOSE TEST STRIPS) STRP Use to check fasting blood sugar daily. May substitute to any manufacturer covered by patient's insurance.   Ibuprofen-diphenhydrAMINE Cit (IBUPROFEN PM PO) Take by mouth at bedtime as needed.   Lancets (ONETOUCH DELICA PLUS LANCET33G) MISC USE TO check blood glucose three times daily AS DIRECTED   Lancets Misc. MISC Use to check fasting blood sugar daily. May substitute to any manufacturer covered by patient's insurance.   [DISCONTINUED] atorvastatin (LIPITOR) 20 MG tablet Take 1 tablet (20 mg total) by mouth daily.   [DISCONTINUED] gabapentin (NEURONTIN) 400 MG capsule Take 1 capsule (400 mg total) by mouth 3 (three) times daily.   [DISCONTINUED] metFORMIN (GLUCOPHAGE) 500 MG tablet Take 1 tablet (500 mg total) by mouth daily with breakfast.   [DISCONTINUED] telmisartan (MICARDIS) 20 MG tablet Take 1 tablet (20 mg total) by mouth daily.   [DISCONTINUED] cephALEXin (KEFLEX) 250 MG capsule Take 1 capsule (250 mg total) by mouth daily. (Patient not taking: Reported on 03/31/2023)   [DISCONTINUED] oxybutynin (DITROPAN-XL) 10 MG 24 hr tablet TAKE ONE TABLET BY MOUTH EVERYDAY AT BEDTIME (Patient not  taking: Reported on 03/06/2023)   [DISCONTINUED] Semaglutide, 1 MG/DOSE, 4 MG/3ML SOPN Inject 1 mg as directed once a week. (Patient not taking: Reported on 03/06/2023)   No facility-administered medications prior to visit.    Review of Systems Last CBC Lab Results  Component Value Date   WBC 4.7 04/03/2023   HGB 11.7 04/03/2023   HCT 35.2 04/03/2023   MCV 97 04/03/2023   MCH 32.3 04/03/2023   RDW 11.9 04/03/2023   PLT 309 04/03/2023   Last metabolic  panel Lab Results  Component Value Date   GLUCOSE 133 (H) 04/03/2023   NA 143 04/03/2023   K 4.1 04/03/2023   CL 108 (H) 04/03/2023   CO2 22 04/03/2023   BUN 19 04/03/2023   CREATININE 1.35 (H) 04/03/2023   EGFR 44 (L) 04/03/2023   CALCIUM 9.3 04/03/2023   PROT 7.1 04/03/2023   ALBUMIN 4.0 04/03/2023   LABGLOB 3.1 04/03/2023   AGRATIO 1.4 02/08/2022   BILITOT 0.5 04/03/2023   ALKPHOS 108 04/03/2023   AST 18 04/03/2023   ALT 20 04/03/2023   ANIONGAP 10 07/07/2017   Last lipids Lab Results  Component Value Date   CHOL 148 04/03/2023   HDL 42 04/03/2023   LDLCALC 89 04/03/2023   TRIG 89 04/03/2023   CHOLHDL 3.5 04/03/2023   Last hemoglobin A1c Lab Results  Component Value Date   HGBA1C 6.0 (H) 05/22/2022   Last thyroid functions Lab Results  Component Value Date   TSH 1.380 04/03/2023     Objective    BP 131/82 (BP Location: Right Arm, Patient Position: Sitting, Cuff Size: Large)   Pulse 80   Resp 16   Ht 5\' 9"  (1.753 m)   Wt 200 lb 14.4 oz (91.1 kg)   BMI 29.67 kg/m   BP Readings from Last 3 Encounters:  04/03/23 131/82  01/20/23 (!) 156/90  10/29/22 120/78   Wt Readings from Last 3 Encounters:  04/03/23 200 lb 14.4 oz (91.1 kg)  01/20/23 191 lb (86.6 kg)  10/29/22 191 lb (86.6 kg)   Physical Exam Vitals and nursing note reviewed.  Constitutional:      General: She is not in acute distress.    Appearance: Normal appearance. She is overweight. She is not ill-appearing, toxic-appearing or diaphoretic.  HENT:     Head: Normocephalic and atraumatic.  Cardiovascular:     Rate and Rhythm: Normal rate and regular rhythm.     Pulses: Normal pulses.     Heart sounds: Normal heart sounds. No murmur heard.    No friction rub. No gallop.  Pulmonary:     Effort: Pulmonary effort is normal. No respiratory distress.     Breath sounds: Normal breath sounds. No stridor. No wheezing, rhonchi or rales.  Chest:     Chest wall: No tenderness.  Musculoskeletal:         General: No swelling, tenderness, deformity or signs of injury. Normal range of motion.     Right lower leg: No edema.     Left lower leg: No edema.  Skin:    General: Skin is warm and dry.     Capillary Refill: Capillary refill takes less than 2 seconds.     Coloration: Skin is not jaundiced or pale.     Findings: No bruising, erythema, lesion or rash.  Neurological:     General: No focal deficit present.     Mental Status: She is alert and oriented to person, place, and time. Mental status is  at baseline.     Cranial Nerves: No cranial nerve deficit.     Sensory: No sensory deficit.     Motor: No weakness.     Coordination: Coordination normal.  Psychiatric:        Mood and Affect: Mood normal.        Behavior: Behavior normal.        Thought Content: Thought content normal.        Judgment: Judgment normal.     Results for orders placed or performed in visit on 04/03/23  Urine microalbumin-creatinine with uACR  Result Value Ref Range   Creatinine, Urine 122.8 Not Estab. mg/dL   Microalbumin, Urine 40.9 Not Estab. ug/mL   Microalb/Creat Ratio 15 0 - 29 mg/g creat  Comprehensive Metabolic Panel (CMET)  Result Value Ref Range   Glucose 133 (H) 70 - 99 mg/dL   BUN 19 8 - 27 mg/dL   Creatinine, Ser 8.11 (H) 0.57 - 1.00 mg/dL   eGFR 44 (L) >91 YN/WGN/5.62   BUN/Creatinine Ratio 14 12 - 28   Sodium 143 134 - 144 mmol/L   Potassium 4.1 3.5 - 5.2 mmol/L   Chloride 108 (H) 96 - 106 mmol/L   CO2 22 20 - 29 mmol/L   Calcium 9.3 8.7 - 10.3 mg/dL   Total Protein 7.1 6.0 - 8.5 g/dL   Albumin 4.0 3.9 - 4.9 g/dL   Globulin, Total 3.1 1.5 - 4.5 g/dL   Bilirubin Total 0.5 0.0 - 1.2 mg/dL   Alkaline Phosphatase 108 44 - 121 IU/L   AST 18 0 - 40 IU/L   ALT 20 0 - 32 IU/L  CBC with Differential/Platelet  Result Value Ref Range   WBC 4.7 3.4 - 10.8 x10E3/uL   RBC 3.62 (L) 3.77 - 5.28 x10E6/uL   Hemoglobin 11.7 11.1 - 15.9 g/dL   Hematocrit 13.0 86.5 - 46.6 %   MCV 97 79 - 97 fL    MCH 32.3 26.6 - 33.0 pg   MCHC 33.2 31.5 - 35.7 g/dL   RDW 78.4 69.6 - 29.5 %   Platelets 309 150 - 450 x10E3/uL   Neutrophils 49 Not Estab. %   Lymphs 41 Not Estab. %   Monocytes 8 Not Estab. %   Eos 2 Not Estab. %   Basos 0 Not Estab. %   Neutrophils Absolute 2.3 1.4 - 7.0 x10E3/uL   Lymphocytes Absolute 1.9 0.7 - 3.1 x10E3/uL   Monocytes Absolute 0.4 0.1 - 0.9 x10E3/uL   EOS (ABSOLUTE) 0.1 0.0 - 0.4 x10E3/uL   Basophils Absolute 0.0 0.0 - 0.2 x10E3/uL   Immature Granulocytes 0 Not Estab. %   Immature Grans (Abs) 0.0 0.0 - 0.1 x10E3/uL  TSH  Result Value Ref Range   TSH 1.380 0.450 - 4.500 uIU/mL  B12 and Folate Panel  Result Value Ref Range   Vitamin B-12 375 232 - 1,245 pg/mL   Folate 13.5 >3.0 ng/mL  Vitamin D (25 hydroxy)  Result Value Ref Range   Vit D, 25-Hydroxy 12.8 (L) 30.0 - 100.0 ng/mL  Iron, TIBC and Ferritin Panel  Result Value Ref Range   Total Iron Binding Capacity 262 250 - 450 ug/dL   UIBC 284 132 - 440 ug/dL   Iron 76 27 - 102 ug/dL   Iron Saturation 29 15 - 55 %   Ferritin 48 15 - 150 ng/mL  Vitamin B1  Result Value Ref Range   Thiamine WILL FOLLOW   Vitamin B6  Result Value  Ref Range   Vitamin B6 WILL FOLLOW   Lipid panel  Result Value Ref Range   Cholesterol, Total 148 100 - 199 mg/dL   Triglycerides 89 0 - 149 mg/dL   HDL 42 >78 mg/dL   VLDL Cholesterol Cal 17 5 - 40 mg/dL   LDL Chol Calc (NIH) 89 0 - 99 mg/dL   Chol/HDL Ratio 3.5 0.0 - 4.4 ratio    Assessment & Plan     Problem List Items Addressed This Visit       Endocrine   Hyperlipidemia associated with type 2 diabetes mellitus (HCC)   Relevant Medications   metFORMIN (GLUCOPHAGE) 500 MG tablet   telmisartan (MICARDIS) 20 MG tablet   Other Relevant Orders   Lipid panel   Type 2 diabetes mellitus with hyperglycemia, without long-term current use of insulin (HCC)   Relevant Medications   metFORMIN (GLUCOPHAGE) 500 MG tablet   telmisartan (MICARDIS) 20 MG tablet   Other  Relevant Orders   Urine microalbumin-creatinine with uACR (Completed)     Nervous and Auditory   Lumbar radiculopathy   Relevant Medications   gabapentin (NEURONTIN) 300 MG capsule     Genitourinary   Stage 3a chronic kidney disease (HCC)     Other   Chronic pain syndrome   Relevant Medications   gabapentin (NEURONTIN) 300 MG capsule   Diabetic eye exam (HCC) - Primary   Relevant Medications   metFORMIN (GLUCOPHAGE) 500 MG tablet   telmisartan (MICARDIS) 20 MG tablet   Other Relevant Orders   Ambulatory referral to Ophthalmology   Spinal stenosis, lumbar region, with neurogenic claudication   Relevant Medications   gabapentin (NEURONTIN) 300 MG capsule   Other Visit Diagnoses     Immunization due       Relevant Orders   Flu vaccine trivalent PF, 6mos and older(Flulaval,Afluria,Fluarix,Fluzone) (Completed)   Chronic bilateral low back pain with bilateral sciatica       Relevant Medications   gabapentin (NEURONTIN) 300 MG capsule   Other Relevant Orders   Comprehensive Metabolic Panel (CMET) (Completed)   CBC with Differential/Platelet (Completed)   TSH (Completed)   B12 and Folate Panel (Completed)   Vitamin D (25 hydroxy) (Completed)   Iron, TIBC and Ferritin Panel (Completed)   Vitamin B1 (Completed)   Vitamin B6 (Completed)   Ambulatory referral to Orthopedic Surgery   Ambulatory referral to Pain Clinic   Screening mammogram for breast cancer       Relevant Orders   MM 3D SCREENING MAMMOGRAM BILATERAL BREAST   Type 2 diabetes mellitus without complication, without long-term current use of insulin (HCC)       Relevant Medications   metFORMIN (GLUCOPHAGE) 500 MG tablet   telmisartan (MICARDIS) 20 MG tablet      Diabetes Mellitus Well controlled with Trulicity and Metformin. Patient is participating in a weight management program. -Refill Trulicity and Metformin prescriptions. -Continue current management and monitor progress in weight management  program.  Hypertension Elevated blood pressure noted during visit, but patient reports normal readings at home. -Refill Telmisartan prescription. -Monitor blood pressure at home.  Chronic Kidney Disease Stage 3, improved slightly since last assessment. -Order metabolic panel to assess current kidney function.  Urinary Tract Infections Ongoing, under the care of Dr. Jacquelyne Balint. -Continue current management with Dr. Jacquelyne Balint.  Preventive Medicine Lapsed mammogram and shingles vaccination. -Schedule mammogram. -Advise patient to get shingles vaccination at pharmacy.  Influenza Vaccination Eligible for current season. -Administer flu vaccine today.  Follow-up Plan for  a follow-up visit in three months to assess overall health status and lab results.  Return in about 3 months (around 07/04/2023) for chonic disease management.     Leilani Merl, FNP, have reviewed all documentation for this visit. The documentation on 04/06/23 for the exam, diagnosis, procedures, and orders are all accurate and complete.  Jacky Kindle, FNP  [facility] [phone] (phone) [phone] (fax)  [facility]

## 2023-04-04 ENCOUNTER — Other Ambulatory Visit: Payer: Self-pay | Admitting: Family Medicine

## 2023-04-04 MED ORDER — ROSUVASTATIN CALCIUM 40 MG PO TABS: 40.0000 mg | ORAL_TABLET | Freq: Every day | ORAL | 3 refills | Status: DC

## 2023-04-08 LAB — MICROALBUMIN / CREATININE URINE RATIO
Creatinine, Urine: 122.8 mg/dL
Microalb/Creat Ratio: 15 mg/g{creat} (ref 0–29)
Microalbumin, Urine: 17.9 ug/mL

## 2023-04-08 LAB — COMPREHENSIVE METABOLIC PANEL
ALT: 20 IU/L (ref 0–32)
AST: 18 IU/L (ref 0–40)
Albumin: 4 g/dL (ref 3.9–4.9)
Alkaline Phosphatase: 108 IU/L (ref 44–121)
BUN/Creatinine Ratio: 14 (ref 12–28)
BUN: 19 mg/dL (ref 8–27)
Bilirubin Total: 0.5 mg/dL (ref 0.0–1.2)
CO2: 22 mmol/L (ref 20–29)
Calcium: 9.3 mg/dL (ref 8.7–10.3)
Chloride: 108 mmol/L — ABNORMAL HIGH (ref 96–106)
Creatinine, Ser: 1.35 mg/dL — ABNORMAL HIGH (ref 0.57–1.00)
Globulin, Total: 3.1 g/dL (ref 1.5–4.5)
Glucose: 133 mg/dL — ABNORMAL HIGH (ref 70–99)
Potassium: 4.1 mmol/L (ref 3.5–5.2)
Sodium: 143 mmol/L (ref 134–144)
Total Protein: 7.1 g/dL (ref 6.0–8.5)
eGFR: 44 mL/min/{1.73_m2} — ABNORMAL LOW (ref >59)

## 2023-04-08 LAB — CBC WITH DIFFERENTIAL/PLATELET
Basophils Absolute: 0 10*3/uL (ref 0.0–0.2)
Basos: 0 %
EOS (ABSOLUTE): 0.1 10*3/uL (ref 0.0–0.4)
Eos: 2 %
Hematocrit: 35.2 % (ref 34.0–46.6)
Hemoglobin: 11.7 g/dL (ref 11.1–15.9)
Immature Grans (Abs): 0 10*3/uL (ref 0.0–0.1)
Immature Granulocytes: 0 %
Lymphocytes Absolute: 1.9 10*3/uL (ref 0.7–3.1)
Lymphs: 41 %
MCH: 32.3 pg (ref 26.6–33.0)
MCHC: 33.2 g/dL (ref 31.5–35.7)
MCV: 97 fL (ref 79–97)
Monocytes Absolute: 0.4 10*3/uL (ref 0.1–0.9)
Monocytes: 8 %
Neutrophils Absolute: 2.3 10*3/uL (ref 1.4–7.0)
Neutrophils: 49 %
Platelets: 309 10*3/uL (ref 150–450)
RBC: 3.62 x10E6/uL — ABNORMAL LOW (ref 3.77–5.28)
RDW: 11.9 % (ref 11.7–15.4)
WBC: 4.7 10*3/uL (ref 3.4–10.8)

## 2023-04-08 LAB — IRON,TIBC AND FERRITIN PANEL
Ferritin: 48 ng/mL (ref 15–150)
Iron Saturation: 29 % (ref 15–55)
Iron: 76 ug/dL (ref 27–139)
Total Iron Binding Capacity: 262 ug/dL (ref 250–450)
UIBC: 186 ug/dL (ref 118–369)

## 2023-04-08 LAB — B12 AND FOLATE PANEL
Folate: 13.5 ng/mL (ref >3.0)
Vitamin B-12: 375 pg/mL (ref 232–1245)

## 2023-04-08 LAB — LIPID PANEL
Chol/HDL Ratio: 3.5 ratio (ref 0.0–4.4)
Cholesterol, Total: 148 mg/dL (ref 100–199)
HDL: 42 mg/dL (ref >39)
LDL Chol Calc (NIH): 89 mg/dL (ref 0–99)
Triglycerides: 89 mg/dL (ref 0–149)
VLDL Cholesterol Cal: 17 mg/dL (ref 5–40)

## 2023-04-08 LAB — TSH: TSH: 1.38 u[IU]/mL (ref 0.450–4.500)

## 2023-04-08 LAB — VITAMIN B1: Thiamine: 90.9 nmol/L (ref 66.5–200.0)

## 2023-04-08 LAB — VITAMIN B6: Vitamin B6: 8.2 ug/L (ref 3.4–65.2)

## 2023-04-08 LAB — VITAMIN D 25 HYDROXY (VIT D DEFICIENCY, FRACTURES): Vit D, 25-Hydroxy: 12.8 ng/mL — ABNORMAL LOW (ref 30.0–100.0)

## 2023-04-18 DIAGNOSIS — E118 Type 2 diabetes mellitus with unspecified complications: Secondary | ICD-10-CM | POA: Diagnosis not present

## 2023-04-22 DIAGNOSIS — E118 Type 2 diabetes mellitus with unspecified complications: Secondary | ICD-10-CM | POA: Diagnosis not present

## 2023-04-29 ENCOUNTER — Encounter: Payer: Medicare Other | Admitting: Student in an Organized Health Care Education/Training Program

## 2023-04-30 ENCOUNTER — Other Ambulatory Visit: Payer: Self-pay | Admitting: Family Medicine

## 2023-04-30 DIAGNOSIS — E1159 Type 2 diabetes mellitus with other circulatory complications: Secondary | ICD-10-CM

## 2023-04-30 NOTE — Telephone Encounter (Signed)
Requested Prescriptions  Pending Prescriptions Disp Refills   amLODipine (NORVASC) 5 MG tablet [Pharmacy Med Name: AMLODIPINE BESYLATE 5 MG TAB] 90 tablet 0    Sig: TAKE 1 TABLET BY MOUTH EVERY DAY IN THE MORNING     Cardiovascular: Calcium Channel Blockers 2 Passed - 04/30/2023 10:11 AM      Passed - Last BP in normal range    BP Readings from Last 1 Encounters:  04/03/23 131/82         Passed - Last Heart Rate in normal range    Pulse Readings from Last 1 Encounters:  04/03/23 80         Passed - Valid encounter within last 6 months    Recent Outpatient Visits           3 weeks ago Diabetic eye exam [facility])   [place] [facility] Jacky Kindle, FNP   4 months ago Upper respiratory symptom   [place] [facility] Merita Norton T, FNP   6 months ago Arthralgia of both lower legs   [place] [facility] Ronnald Ramp, MD   9 months ago Bronchitis   [facility] Merita Norton T, FNP   11 months ago Type 2 diabetes mellitus with hyperglycemia, without long-term current use of insulin [facility])   [place] [facility] Jacky Kindle, FNP       Future Appointments             In 3 weeks MacDiarmid, Lorin Picket, MD [facility]

## 2023-05-02 DIAGNOSIS — E118 Type 2 diabetes mellitus with unspecified complications: Secondary | ICD-10-CM | POA: Diagnosis not present

## 2023-05-21 ENCOUNTER — Ambulatory Visit: Payer: Medicare Other | Admitting: Emergency Medicine

## 2023-05-21 VITALS — Ht 69.0 in | Wt 195.0 lb

## 2023-05-21 DIAGNOSIS — Z0000 Encounter for general adult medical examination without abnormal findings: Secondary | ICD-10-CM | POA: Diagnosis not present

## 2023-05-21 NOTE — Progress Notes (Signed)
 Subjective:   Emily Stokes is a 63 y.o. female who presents for Medicare Annual (Subsequent) preventive examination.  Interactive audio and video telecommunications were attempted between this provider and patient, however failed, due to patient having technical difficulties OR patient did not have access to video capability.  We continued and completed visit with audio only.   Visit Complete: Virtual I connected with  Emily Stokes on 05/21/23 by a audio enabled telemedicine application and verified that I am speaking with the correct person using two identifiers.  Patient Location: Home  Provider Location: Home Office  I discussed the limitations of evaluation and management by telemedicine. The patient expressed understanding and agreed to proceed.  Vital Signs: Because this visit was a virtual/telehealth visit, some criteria may be missing or patient reported. Any vitals not documented were not able to be obtained and vitals that have been documented are patient reported.   Cardiac Risk Factors include: hypertension;diabetes mellitus;dyslipidemia;sedentary lifestyle     Objective:    Today's Vitals   05/21/23 1519 05/21/23 1520  Weight: 195 lb (88.5 kg)   Height: 5' 9 (1.753 m)   PainSc:  10-Worst pain ever   Body mass index is 28.8 kg/m.     05/21/2023    3:39 PM 10/29/2022   10:08 AM 05/16/2022   10:08 AM 05/09/2021    1:40 PM 12/20/2020    9:02 AM 05/17/2020   10:05 AM 05/03/2020    2:37 PM  Advanced Directives  Does Patient Have a Medical Advance Directive? No No No No No No No  Would patient like information on creating a medical advance directive? Yes (MAU/Ambulatory/Procedural Areas - Information given) No - Patient declined No - Patient declined No - Patient declined No - Patient declined No - Patient declined No - Patient declined    Current Medications (verified) Outpatient Encounter Medications as of 05/21/2023  Medication Sig   acetaminophen  (TYLENOL ) 500  MG tablet Take 500 mg by mouth every 6 (six) hours as needed.   amLODipine  (NORVASC ) 5 MG tablet TAKE 1 TABLET BY MOUTH EVERY DAY IN THE MORNING   aspirin  EC (ASPIRIN  LOW DOSE) 81 MG tablet TAKE ONE TABLET BY MOUTH ONCE DAILY swallow whole   Blood Glucose Monitoring Suppl DEVI 1 each by Does not apply route daily in the afternoon. May substitute to any manufacturer covered by patient's insurance.   cephALEXin  (KEFLEX ) 250 MG capsule Take 250 mg by mouth daily.   Dulaglutide  (TRULICITY ) 3 MG/0.5ML SOAJ INJECT 3MG  INTO THE SKIN ONCE EACH WEEK   gabapentin  (NEURONTIN ) 300 MG capsule Take 3 capsules (900 mg total) by mouth 3 (three) times daily.   Glucose Blood (BLOOD GLUCOSE TEST STRIPS) STRP Use to check fasting blood sugar daily. May substitute to any manufacturer covered by patient's insurance.   Ibuprofen-diphenhydrAMINE Cit (IBUPROFEN PM PO) Take by mouth at bedtime as needed.   Lancets (ONETOUCH DELICA PLUS LANCET33G) MISC USE TO check blood glucose three times daily AS DIRECTED   Lancets Misc. MISC Use to check fasting blood sugar daily. May substitute to any manufacturer covered by patient's insurance.   metFORMIN  (GLUCOPHAGE ) 500 MG tablet Take 1 tablet (500 mg total) by mouth daily with breakfast.   rosuvastatin  (CRESTOR ) 40 MG tablet Take 1 tablet (40 mg total) by mouth daily.   telmisartan  (MICARDIS ) 20 MG tablet Take 1 tablet (20 mg total) by mouth daily.   No facility-administered encounter medications on file as of 05/21/2023.    Allergies (verified)  Sulfamethoxazole -trimethoprim  and Ciprofloxacin    History: Past Medical History:  Diagnosis Date   Abdominal pain, epigastric    Acute cystitis without hematuria 04/19/2021   Anemia    Backache    Bronchitis    Calculus, kidney    Carpal tunnel syndrome    Cellulitis of face 05/30/2016   Chest pain    Chicken pox    Circulatory disease    Diabetes mellitus without complication (HCC)    Disorder of kidney and ureter     Excess, menstruation    Hypercholesteremia    Hypertension, essential, benign    Infected postoperative seroma    Infected surgical wound 09/15/2008   Iron deficiency    Leg pain 02/02/2015   Malaise and fatigue    Measles    MRSA (methicillin resistant staph aureus) culture positive    Nausea alone    Nonspecific abnormal electrocardiogram (ECG) (EKG)    Pain in elbow 02/05/2016   Pain in joint, lower leg    Pain in shoulder 12/05/2011   Sleep apnea    Past Surgical History:  Procedure Laterality Date   ABDOMINAL HYSTERECTOMY     CESAREAN SECTION     COLONOSCOPY  1997   COLONOSCOPY WITH PROPOFOL  N/A 01/09/2018   Procedure: COLONOSCOPY WITH PROPOFOL ;  Surgeon: Janalyn Keene NOVAK, MD;  Location: [facility] ENDOSCOPY;  Service: Endoscopy;  Laterality: N/A;   COLONOSCOPY WITH PROPOFOL  N/A 08/20/2019   Procedure: COLONOSCOPY WITH PROPOFOL ;  Surgeon: Jinny Carmine, MD;  Location: [facility] ENDOSCOPY;  Service: Endoscopy;  Laterality: N/A;   COLOSTOMY TAKEDOWN  2012   Hartmann's procedure.     HERNIA REPAIR  2012   umbilical   LITHOTRIPSY  1997   with complications, hospitalized due to these complications for 3 months   mrsa     removal on nose   PARTIAL HYSTERECTOMY  2009    vaginal hysterectomy, has both ovaries   Family History  Problem Relation Age of Onset   Diabetes Mother    Hypertension Mother    Hypertension Father    Stroke Father    Diabetes Other        sibling   Diabetes Other        sibling   Diabetes Other        sibling   Hypertension Other        sibling   Hypertension Other        sibling   Hypertension Other        sibling   Social History   Socioeconomic History   Marital status: Widowed    Spouse name: Not on file   Number of children: 4   Years of education: Not on file   Highest education level: Bachelor's degree (e.g., BA, AB, BS)  Occupational History   Occupation: Geologist, engineering    Comment: disability   Occupation: disability  Tobacco Use    Smoking status: Never   Smokeless tobacco: Never  Vaping Use   Vaping status: Never Used  Substance and Sexual Activity   Alcohol use: Never   Drug use: Never   Sexual activity: Never  Other Topics Concern   Not on file  Social History Narrative   Not on file   Social Drivers of Health   Financial Resource Strain: Medium Risk (05/21/2023)   Overall Financial Resource Strain (CARDIA)    Difficulty of Paying Living Expenses: Somewhat hard  Food Insecurity: No Food Insecurity (05/21/2023)   Hunger Vital Sign    Worried About  Running Out of Food in the Last Year: Never true    Ran Out of Food in the Last Year: Never true  Recent Concern: Food Insecurity - Food Insecurity Present (04/02/2023)   Hunger Vital Sign    Worried About Running Out of Food in the Last Year: Sometimes true    Ran Out of Food in the Last Year: Sometimes true  Transportation Needs: No Transportation Needs (05/21/2023)   PRAPARE - Administrator, Civil Service (Medical): No    Lack of Transportation (Non-Medical): No  Physical Activity: Inactive (05/21/2023)   Exercise Vital Sign    Days of Exercise per Week: 0 days    Minutes of Exercise per Session: 0 min  Stress: No Stress Concern Present (05/21/2023)   [employer] of Occupational Health - Occupational Stress Questionnaire    Feeling of Stress : Not at all  Social Connections: Moderately Integrated (05/21/2023)   Social Connection and Isolation Panel [NHANES]    Frequency of Communication with Friends and Family: More than three times a week    Frequency of Social Gatherings with Friends and Family: More than three times a week    Attends Religious Services: More than 4 times per year    Active Member of [employer] or Organizations: Yes    Attends Banker Meetings: More than 4 times per year    Marital Status: Widowed    Tobacco Counseling Counseling given: Not Answered   Clinical Intake:  Pre-visit preparation completed:  Yes  Pain : 0-10 Pain Score: 10-Worst pain ever Pain Type: Chronic pain Pain Location: Leg Pain Orientation: Left Pain Descriptors / Indicators: Aching     BMI - recorded: 28.8 Nutritional Status: BMI 25 -29 Overweight Nutritional Risks: None Diabetes: Yes CBG done?: No Did pt. bring in CBG monitor from home?: No  How often do you need to have someone help you when you read instructions, pamphlets, or other written materials from your doctor or pharmacy?: 1 - Never  Interpreter Needed?: No  Information entered by :: Vina Ned, CMA   Activities of Daily Living    05/21/2023    3:22 PM  In your present state of health, do you have any difficulty performing the following activities:  Hearing? 0  Vision? 0  Difficulty concentrating or making decisions? 0  Walking or climbing stairs? 1  Comment sometimes because of leg pain  Dressing or bathing? 0  Doing errands, shopping? 0  Preparing Food and eating ? N  Using the Toilet? N  In the past six months, have you accidently leaked urine? N  Do you have problems with loss of bowel control? N  Managing your Medications? N  Managing your Finances? N  Housekeeping or managing your Housekeeping? N    Patient Care Team: Emilio Kelly DASEN, FNP as PCP - General (Family Medicine) [facility], [facility]) Marcelino Nurse, MD as Consulting Physician (Pain Medicine) Karoline Lima, RN as Case Manager  Indicate any recent Medical Services you may have received from other than [facility] providers in the past year (date may be approximate).     Assessment:   This is a routine wellness examination for Henlee.  Hearing/Vision screen Hearing Screening - Comments:: Denies hearing loss Vision Screening - Comments:: Needs eye exam, order placed 04/03/23   Goals Addressed               This Visit's Progress     DIET - EAT MORE FRUITS AND VEGETABLES (pt-stated)  Depression Screen    05/21/2023    3:36 PM 04/03/2023     2:48 PM 03/06/2023   12:13 PM 05/22/2022    3:53 PM 05/16/2022   10:06 AM 02/08/2022    8:46 AM 12/19/2021    9:47 AM  PHQ 2/9 Scores  PHQ - 2 Score 0  0 0 0 0 0  PHQ- 9 Score    2 0 1 1  Exception Documentation  Patient refusal         Fall Risk    05/21/2023    3:33 PM 03/31/2023   10:55 AM 10/29/2022   10:08 AM 05/22/2022    3:53 PM 05/16/2022   10:08 AM  Fall Risk   Falls in the past year? 1 0 0 0 0  Number falls in past yr: 1 0  0 0  Injury with Fall? 1 0  0 0  Risk for fall due to : History of fall(s);Impaired balance/gait;Orthopedic patient Medication side effect;Other (Comment)  No Fall Risks No Fall Risks  Risk for fall due to: Comment  Reports episodes of numbness to feet/toes. Currently does not impact ability to ambulate     Follow up Education provided;Falls prevention discussed;Falls evaluation completed Falls prevention discussed   Falls prevention discussed;Falls evaluation completed    MEDICARE RISK AT HOME: Medicare Risk at Home Any stairs in or around the home?: Yes If so, are there any without handrails?: No Home free of loose throw rugs in walkways, pet beds, electrical cords, etc?: Yes Adequate lighting in your home to reduce risk of falls?: Yes Life alert?: No Use of a cane, walker or w/c?: No Grab bars in the bathroom?: No Shower chair or bench in shower?: No Elevated toilet seat or a handicapped toilet?: No  TIMED UP AND GO:  Was the test performed?  No    Cognitive Function:        05/21/2023    3:39 PM 05/16/2022   10:09 AM 10/24/2016    2:54 PM  6CIT Screen  What Year? 0 points 0 points 0 points  What month? 0 points 0 points 0 points  What time? 0 points 0 points 0 points  Count back from 20 0 points 0 points 0 points  Months in reverse 0 points 0 points 0 points  Repeat phrase 0 points 0 points 6 points  Total Score 0 points 0 points 6 points    Immunizations Immunization History  Administered Date(s) Administered   Influenza Split  05/26/2007   Influenza, Seasonal, Injecte, Preservative Fre 04/03/2023   Influenza,inj,Quad PF,6+ Mos 05/31/2016, 05/19/2017, 01/30/2018, 02/11/2020, 07/05/2021, 02/08/2022   PFIZER(Purple Top)SARS-COV-2 Vaccination 08/05/2019, 08/31/2019   Pneumococcal Polysaccharide-23 01/30/2018    TDAP status: Due, Education has been provided regarding the importance of this vaccine. Advised may receive this vaccine at local pharmacy or Health Dept. Aware to provide a copy of the vaccination record if obtained from local pharmacy or Health Dept. Verbalized acceptance and understanding.  Flu Vaccine status: Up to date  Pneumococcal vaccine status: Due, Education has been provided regarding the importance of this vaccine. Advised may receive this vaccine at local pharmacy or Health Dept. Aware to provide a copy of the vaccination record if obtained from local pharmacy or Health Dept. Verbalized acceptance and understanding.  Covid-19 vaccine status: Information provided on how to obtain vaccines.   Qualifies for Shingles Vaccine? Yes   Zostavax completed No   Shingrix Completed?: No.    Education has been provided regarding the  importance of this vaccine. Patient has been advised to call insurance company to determine out of pocket expense if they have not yet received this vaccine. Advised may also receive vaccine at local pharmacy or Health Dept. Verbalized acceptance and understanding.  Screening Tests Health Maintenance  Topic Date Due   DTaP/Tdap/Td (1 - Tdap) Never done   Zoster Vaccines- Shingrix (1 of 2) Never done   Pneumococcal Vaccine 56-60 Years old (2 of 2 - PCV) 01/31/2019   OPHTHALMOLOGY EXAM  09/14/2020   FOOT EXAM  07/05/2022   HEMOGLOBIN A1C  11/20/2022   MAMMOGRAM  01/12/2023   COVID-19 Vaccine (3 - 2024-25 season) 01/12/2023   Diabetic kidney evaluation - eGFR measurement  04/02/2024   Diabetic kidney evaluation - Urine ACR  04/02/2024   Medicare Annual Wellness (AWV)  05/20/2024    Colonoscopy  08/19/2024   INFLUENZA VACCINE  Completed   Hepatitis C Screening  Completed   HIV Screening  Completed   HPV VACCINES  Aged Out    Health Maintenance  Health Maintenance Due  Topic Date Due   DTaP/Tdap/Td (1 - Tdap) Never done   Zoster Vaccines- Shingrix (1 of 2) Never done   Pneumococcal Vaccine 68-67 Years old (2 of 2 - PCV) 01/31/2019   OPHTHALMOLOGY EXAM  09/14/2020   FOOT EXAM  07/05/2022   HEMOGLOBIN A1C  11/20/2022   MAMMOGRAM  01/12/2023   COVID-19 Vaccine (3 - 2024-25 season) 01/12/2023    Colorectal cancer screening: Type of screening: Colonoscopy. Completed 08/20/19. Repeat every 5 years  Mammogram status: Ordered 04/03/23. Pt provided with contact info and advised to call to schedule appt.     Lung Cancer Screening: (Low Dose CT Chest recommended if Age 65-80 years, 20 pack-year currently smoking OR have quit w/in 15years.) does not qualify.   Lung Cancer Screening Referral: n/a  Additional Screening:  Hepatitis C Screening: does not qualify; Completed 10/24/16  Vision Screening: Recommended annual ophthalmology exams for early detection of glaucoma and other disorders of the eye. Is the patient up to date with their annual eye exam?  No  Who is the provider or what is the name of the office in which the patient attends annual eye exams? [facility] If pt is not established with a provider, would they like to be referred to a provider to establish care? Yes . Referral placed to [facility] on 04/03/23  Dental Screening: Recommended annual dental exams for proper oral hygiene  Diabetic Foot Exam: Diabetic Foot Exam: Overdue, Pt has been advised about the importance in completing this exam. Pt is scheduled for diabetic foot exam on 07/07/23 at next OV.  Community Resource Referral / Chronic Care Management: CRR required this visit?  No   CCM required this visit?  No     Plan:     I have personally reviewed and noted the  following in the patient's chart:   Medical and social history Use of alcohol, tobacco or illicit drugs  Current medications and supplements including opioid prescriptions. Patient is not currently taking opioid prescriptions. Functional ability and status Nutritional status Physical activity Advanced directives List of other physicians Hospitalizations, surgeries, and ER visits in previous 12 months Vitals Screenings to include cognitive, depression, and falls Referrals and appointments  In addition, I have reviewed and discussed with patient certain preventive protocols, quality metrics, and best practice recommendations. A written personalized care plan for preventive services as well as general preventive health recommendations were provided to patient.  Vina Ned, CMA   05/21/2023   After Visit Summary: (MyChart) Due to this being a telephonic visit, the after visit summary with patients personalized plan was offered to patient via MyChart   Nurse Notes:  Declined DM & Nutrition education Gave phone # to call and schedule DM eye exam with [facility]. Gave phone # to [facility] to schedule MMG Needs Tdap, Covid and shingles vaccines Scheduled OV for 07/07/23 (previous Kelly Cedar, NP patient)

## 2023-05-21 NOTE — Patient Instructions (Addendum)
 Emily Stokes , Thank you for taking time to come for your Medicare Wellness Visit. I appreciate your ongoing commitment to your health goals. Please review the following plan we discussed and let me know if I can assist you in the future.   Referrals/Orders/Follow-Ups/Clinician Recommendations: Call [facility] @ [phone] to schedule an eye exam at your earliest convenience. Call [facility] @ [phone] to schedule a mammogram at your earliest convenience. You need tetanus, covid and shingles vaccines. I have scheduled you an appointment with Masten Boom, NP for 07/07/23 @ 3:00 pm.  This is a list of the screening recommended for you and due dates:  Health Maintenance  Topic Date Due   DTaP/Tdap/Td vaccine (1 - Tdap) Never done   Zoster (Shingles) Vaccine (1 of 2) Never done   Eye exam for diabetics  09/14/2020   Complete foot exam   07/05/2022   Hemoglobin A1C  11/20/2022   Mammogram  01/12/2023   COVID-19 Vaccine (3 - 2024-25 season) 01/12/2023   Yearly kidney function blood test for diabetes  04/02/2024   Yearly kidney health urinalysis for diabetes  04/02/2024   Medicare Annual Wellness Visit  05/20/2024   Colon Cancer Screening  08/19/2024   Flu Shot  Completed   Hepatitis C Screening  Completed   HIV Screening  Completed   HPV Vaccine  Aged Out    Advanced directives: (ACP Link)Information on Advanced Care Planning can be found at [place]  Secretary of State Advance Health Care Directives Advance Health Care Directives ([email or web address]/)   Next Medicare Annual Wellness Visit scheduled for next year: Yes, 05/26/24 @ 3:10pm (video visit)  Fall Prevention in the Home, Adult Falls can cause injuries and affect people of all ages. There are many simple things that you can do to make your home safe and to help prevent falls. If you need it, ask for help making these changes. What actions can I take to prevent falls? General information Use good lighting in all  rooms. Make sure to: Replace any light bulbs that burn out. Turn on lights if it is dark and use night-lights. Keep items that you use often in easy-to-reach places. Lower the shelves around your home if needed. Move furniture so that there are clear paths around it. Do not keep throw rugs or other things on the floor that can make you trip. If any of your floors are uneven, fix them. Add color or contrast paint or tape to clearly mark and help you see: Grab bars or handrails. First and last steps of staircases. Where the edge of each step is. If you use a ladder or stepladder: Make sure that it is fully opened. Do not climb a closed ladder. Make sure the sides of the ladder are locked in place. Have someone hold the ladder while you use it. Know where your pets are as you move through your home. What can I do in the bathroom?     Keep the floor dry. Clean up any water that is on the floor right away. Remove soap buildup in the bathtub or shower. Buildup makes bathtubs and showers slippery. Use non-skid mats or decals on the floor of the bathtub or shower. Attach bath mats securely with double-sided, non-slip rug tape. If you need to sit down while you are in the shower, use a non-slip stool. Install grab bars by the toilet and in the bathtub and shower. Do not use towel bars as grab bars. What can I do  in the bedroom? Make sure that you have a light by your bed that is easy to reach. Do not use any sheets or blankets on your bed that hang to the floor. Have a firm bench or chair with side arms that you can use for support when you get dressed. What can I do in the kitchen? Clean up any spills right away. If you need to reach something above you, use a sturdy step stool that has a grab bar. Keep electrical cables out of the way. Do not use floor polish or wax that makes floors slippery. What can I do with my stairs? Do not leave anything on the stairs. Make sure that you have a  light switch at the top and the bottom of the stairs. Have them installed if you do not have them. Make sure that there are handrails on both sides of the stairs. Fix handrails that are broken or loose. Make sure that handrails are as long as the staircases. Install non-slip stair treads on all stairs in your home if they do not have carpet. Avoid having throw rugs at the top or bottom of stairs, or secure the rugs with carpet tape to prevent them from moving. Choose a carpet design that does not hide the edge of steps on the stairs. Make sure that carpet is firmly attached to the stairs. Fix any carpet that is loose or worn. What can I do on the outside of my home? Use bright outdoor lighting. Repair the edges of walkways and driveways and fix any cracks. Clear paths of anything that can make you trip, such as tools or rocks. Add color or contrast paint or tape to clearly mark and help you see high doorway thresholds. Trim any bushes or trees on the main path into your home. Check that handrails are securely fastened and in good repair. Both sides of all steps should have handrails. Install guardrails along the edges of any raised decks or porches. Have leaves, snow, and ice cleared regularly. Use sand, salt, or ice melt on walkways during winter months if you live where there is ice and snow. In the garage, clean up any spills right away, including grease or oil spills. What other actions can I take? Review your medicines with your health care provider. Some medicines can make you confused or feel dizzy. This can increase your chance of falling. Wear closed-toe shoes that fit well and support your feet. Wear shoes that have rubber soles and low heels. Use a cane, walker, scooter, or crutches that help you move around if needed. Talk with your provider about other ways that you can decrease your risk of falls. This may include seeing a physical therapist to learn to do exercises to improve  movement and strength. Where to find more information Centers for Disease Control and Prevention, STEADI: tonerpromos.no [employer] on Aging: baseringtones.pl National Institute on Aging: baseringtones.pl Contact a health care provider if: You are afraid of falling at home. You feel weak, drowsy, or dizzy at home. You fall at home. Get help right away if you: Lose consciousness or have trouble moving after a fall. Have a fall that causes a head injury. These symptoms may be an emergency. Get help right away. Call 911. Do not wait to see if the symptoms will go away. Do not drive yourself to the hospital. This information is not intended to replace advice given to you by your health care provider. Make sure you discuss any questions  you have with your health care provider. Document Revised: 12/31/2021 Document Reviewed: 12/31/2021 Elsevier Patient Education  2024 [employer].

## 2023-05-26 ENCOUNTER — Ambulatory Visit (INDEPENDENT_AMBULATORY_CARE_PROVIDER_SITE_OTHER): Payer: Medicare Other | Admitting: Urology

## 2023-05-26 ENCOUNTER — Ambulatory Visit: Payer: Medicare Other | Admitting: Urology

## 2023-05-26 VITALS — BP 131/78 | HR 87 | Ht 69.0 in | Wt 198.0 lb

## 2023-05-26 DIAGNOSIS — R350 Frequency of micturition: Secondary | ICD-10-CM

## 2023-05-26 DIAGNOSIS — Z87440 Personal history of urinary (tract) infections: Secondary | ICD-10-CM | POA: Diagnosis not present

## 2023-05-26 DIAGNOSIS — E118 Type 2 diabetes mellitus with unspecified complications: Secondary | ICD-10-CM | POA: Diagnosis not present

## 2023-05-26 DIAGNOSIS — N3020 Other chronic cystitis without hematuria: Secondary | ICD-10-CM

## 2023-05-26 LAB — URINALYSIS, COMPLETE
Bilirubin, UA: NEGATIVE
Glucose, UA: NEGATIVE
Ketones, UA: NEGATIVE
Nitrite, UA: POSITIVE — AB
Specific Gravity, UA: 1.025 (ref 1.005–1.030)
Urobilinogen, Ur: 0.2 mg/dL (ref 0.2–1.0)
pH, UA: 5.5 (ref 5.0–7.5)

## 2023-05-26 LAB — MICROSCOPIC EXAMINATION

## 2023-05-26 MED ORDER — CEPHALEXIN 250 MG PO CAPS: 250.0000 mg | ORAL_CAPSULE | Freq: Every day | ORAL | 11 refills | Status: DC

## 2023-05-26 NOTE — Progress Notes (Signed)
 05/26/2023 1:11 PM   Emily Stokes 27-Aug-1960 [id number]  Referring provider: Emilio Kelly DASEN, FNP No address on file  Chief Complaint  Patient presents with   Urinary Frequency    HPI: I was consulted to assess the patient for recurrent bladder infections.  She complains of worsening urge incontinence frequency and foul-smelling urine that respond favorably to antibiotics.   At baseline she voids every 2 hours and gets up 3 times at night.  Sometimes she leaks with coughing sneezing.  Sometimes she has urge incontinence.  Most days she does not wear a pad but sometimes she does   She thinks she may be infected today   She has had a hysterectomy kidney stone   She has had positive urine cultures in the medical record     Pathophysiology of UTIs discussed. Call if renal ultrasound is normal. Call in ciprofloxacin  250 mg twice a day for 7 days. Then placed patient on nitrofurantoin  100 mg 30 x 11. Have patient come back and see me for cystoscopy in approximately 6 to 7 weeks    Today Frequency stable.  Last culture negative.  She had a lesion noted on ultrasound so I ordered a CT scan.  She had bilateral renal cortical scarring with some general atrophy of the right kidney likely from infection.  She was cleared on CT scan for renal cyst with no cancer.   Patient never took the daily Macrodantin .  Clinically not infected today but she was a bit vague   On pelvic examination she is a mild cystocele no stress incontinence.   Cystoscopy: Patient went flexible cystoscopy.  Urine was very cloudy.  There was a lot of sediment.  I looked carefully around the bladder.  It was more challenging to see the urothelium.  No carcinoma.  She has never smoked.   Patient looks like she had a bladder infection by urinalysis and cystoscopy today.  The last culture demonstrated a lot of resistance and based upon allergies I called and Augmentin  1 tablet twice a day for 7 days.  She would then start  daily Macrodantin  100 mg 30 x 11 based on allergies.  Reassess in 8 weeks.  Call if culture differs    Today Frequency stable.  Last culture positive Patient is on daily Macrodantin  but now has foul-smelling urine and frequency and urgency incontinence.  She gets hives from sulfa  drug.  She was cath and urine was sent for culture    Urine reviewed and sent for culture. She has allergies as noted. Based upon last culture I called and Augmentin  1 tablet twice a day for 7 days. She would then go on daily Keflex  250 mg daily 30 x 11 and reassess 3 months   Today Frequency stable.  Last culture was positive.  Patient had stopped the medication and I urged her to continue it.  Clinically not infected.   PMH: Past Medical History:  Diagnosis Date   Abdominal pain, epigastric    Acute cystitis without hematuria 04/19/2021   Anemia    Backache    Bronchitis    Calculus, kidney    Carpal tunnel syndrome    Cellulitis of face 05/30/2016   Chest pain    Chicken pox    Circulatory disease    Diabetes mellitus without complication (HCC)    Disorder of kidney and ureter    Excess, menstruation    Hypercholesteremia    Hypertension, essential, benign    Infected postoperative seroma  Infected surgical wound 09/15/2008   Iron deficiency    Leg pain 02/02/2015   Malaise and fatigue    Measles    MRSA (methicillin resistant staph aureus) culture positive    Nausea alone    Nonspecific abnormal electrocardiogram (ECG) (EKG)    Pain in elbow 02/05/2016   Pain in joint, lower leg    Pain in shoulder 12/05/2011   Sleep apnea     Surgical History: Past Surgical History:  Procedure Laterality Date   ABDOMINAL HYSTERECTOMY     CESAREAN SECTION     COLONOSCOPY  1997   COLONOSCOPY WITH PROPOFOL  N/A 01/09/2018   Procedure: COLONOSCOPY WITH PROPOFOL ;  Surgeon: Janalyn Keene NOVAK, MD;  Location: [facility] ENDOSCOPY;  Service: Endoscopy;  Laterality: N/A;   COLONOSCOPY WITH PROPOFOL  N/A 08/20/2019    Procedure: COLONOSCOPY WITH PROPOFOL ;  Surgeon: Jinny Carmine, MD;  Location: [facility] ENDOSCOPY;  Service: Endoscopy;  Laterality: N/A;   COLOSTOMY TAKEDOWN  2012   Hartmann's procedure.     HERNIA REPAIR  2012   umbilical   LITHOTRIPSY  1997   with complications, hospitalized due to these complications for 3 months   mrsa     removal on nose   PARTIAL HYSTERECTOMY  2009    vaginal hysterectomy, has both ovaries    Home Medications:  Allergies as of 05/26/2023       Reactions   Sulfamethoxazole -trimethoprim  Swelling, Hives   Ciprofloxacin  Itching, Rash   Swollen around eyes and mouth.         Medication List        Accurate as of May 26, 2023  1:11 PM. If you have any questions, ask your nurse or doctor.          acetaminophen  500 MG tablet Commonly known as: TYLENOL  Take 500 mg by mouth every 6 (six) hours as needed.   amLODipine  5 MG tablet Commonly known as: NORVASC  TAKE 1 TABLET BY MOUTH EVERY DAY IN THE MORNING   aspirin  EC 81 MG tablet Commonly known as: Aspirin  Low Dose TAKE ONE TABLET BY MOUTH ONCE DAILY swallow whole   Blood Glucose Monitoring Suppl Devi 1 each by Does not apply route daily in the afternoon. May substitute to any manufacturer covered by patient's insurance.   BLOOD GLUCOSE TEST STRIPS Strp Use to check fasting blood sugar daily. May substitute to any manufacturer covered by patient's insurance.   cephALEXin  250 MG capsule Commonly known as: KEFLEX  Take 250 mg by mouth daily.   gabapentin  300 MG capsule Commonly known as: Neurontin  Take 3 capsules (900 mg total) by mouth 3 (three) times daily.   IBUPROFEN PM PO Take by mouth at bedtime as needed.   Lancets Misc. Misc Use to check fasting blood sugar daily. May substitute to any manufacturer covered by patient's insurance.   metFORMIN  500 MG tablet Commonly known as: GLUCOPHAGE  Take 1 tablet (500 mg total) by mouth daily with breakfast.   OneTouch Delica Plus Lancet33G  Misc USE TO check blood glucose three times daily AS DIRECTED   rosuvastatin  40 MG tablet Commonly known as: CRESTOR  Take 1 tablet (40 mg total) by mouth daily.   telmisartan  20 MG tablet Commonly known as: MICARDIS  Take 1 tablet (20 mg total) by mouth daily.   Trulicity  3 MG/0.5ML Soaj Generic drug: Dulaglutide  INJECT 3MG  INTO THE SKIN ONCE EACH WEEK        Allergies:  Allergies  Allergen Reactions   Sulfamethoxazole -Trimethoprim  Swelling and Hives   Ciprofloxacin  Itching and  Rash    Swollen around eyes and mouth.     Family History: Family History  Problem Relation Age of Onset   Diabetes Mother    Hypertension Mother    Hypertension Father    Stroke Father    Diabetes Other        sibling   Diabetes Other        sibling   Diabetes Other        sibling   Hypertension Other        sibling   Hypertension Other        sibling   Hypertension Other        sibling    Social History:  reports that she has never smoked. She has never used smokeless tobacco. She reports that she does not drink alcohol and does not use drugs.  ROS:                                        Physical Exam: There were no vitals taken for this visit.  Constitutional:  Alert and oriented, No acute distress. HEENT: [place] AT, moist mucus membranes.  Trachea midline, no masses.   Laboratory Data: Lab Results  Component Value Date   WBC 4.7 04/03/2023   HGB 11.7 04/03/2023   HCT 35.2 04/03/2023   MCV 97 04/03/2023   PLT 309 04/03/2023    Lab Results  Component Value Date   CREATININE 1.35 (H) 04/03/2023    No results found for: PSA  No results found for: TESTOSTERONE   Lab Results  Component Value Date   HGBA1C 6.0 (H) 05/22/2022    Urinalysis    Component Value Date/Time   COLORURINE YELLOW 07/07/2017 1715   APPEARANCEUR Cloudy (A) 01/20/2023 1548   LABSPEC 1.009 07/07/2017 1715   LABSPEC 1.015 03/25/2014 0925   PHURINE 5.0 07/07/2017 1715    GLUCOSEU Negative 01/20/2023 1548   GLUCOSEU NEGATIVE 03/25/2014 0925   HGBUR MODERATE (A) 07/07/2017 1715   BILIRUBINUR Negative 01/20/2023 1548   BILIRUBINUR NEGATIVE 03/25/2014 0925   KETONESUR NEGATIVE 07/07/2017 1715   PROTEINUR Negative 01/20/2023 1548   PROTEINUR NEGATIVE 07/07/2017 1715   UROBILINOGEN 0.2 12/19/2021 0945   NITRITE Positive (A) 01/20/2023 1548   NITRITE POSITIVE (A) 07/07/2017 1715   LEUKOCYTESUR 1+ (A) 01/20/2023 1548   LEUKOCYTESUR NEGATIVE 03/25/2014 0925    Pertinent Imaging: Urine reviewed and sent for culture  Assessment & Plan: If culture positive I will treat it.  This would not be necessarily true breakthrough.  Make sure prescription has been sent.  Reassess 6 months  1. Urinary frequency (Primary)  - Urinalysis, Complete   No follow-ups on file.  Glendia DELENA Elizabeth, MD  [facility] [address] [place], [place] [address] [phone]

## 2023-05-28 LAB — CULTURE, URINE COMPREHENSIVE

## 2023-05-30 ENCOUNTER — Telehealth: Payer: Self-pay | Admitting: *Deleted

## 2023-05-30 DIAGNOSIS — M545 Low back pain, unspecified: Secondary | ICD-10-CM | POA: Diagnosis not present

## 2023-05-30 MED ORDER — AMOXICILLIN-POT CLAVULANATE 875-125 MG PO TABS: 1.0000 | ORAL_TABLET | Freq: Two times a day (BID) | ORAL | 0 refills | Status: AC

## 2023-05-30 NOTE — Telephone Encounter (Signed)
-----   Message from [place] A Macdiarmid sent at 05/30/2023 11:36 AM EST ----- Augmentin 875/125 1 tablet twice a day for 7 days and then resume daily antibiotic ----- Message ----- From: Interface, [facility] Lab Results In Sent: 05/26/2023   4:36 PM EST To: Alfredo Martinez, MD

## 2023-05-30 NOTE — Telephone Encounter (Signed)
MAILBOX IS FULL rx sent to pharmacy by e-script

## 2023-06-01 DIAGNOSIS — E118 Type 2 diabetes mellitus with unspecified complications: Secondary | ICD-10-CM | POA: Diagnosis not present

## 2023-07-07 ENCOUNTER — Ambulatory Visit: Payer: Self-pay | Admitting: Family Medicine

## 2023-07-08 ENCOUNTER — Telehealth: Payer: Self-pay | Admitting: Family Medicine

## 2023-07-08 NOTE — Telephone Encounter (Signed)
 Copied from CRM [id number]. Topic: General - Other >> Jul 08, 2023 11:46 AM Phill Myron wrote: Trinna Post with [employer] Chronic condition verification unit   Does Pt Gammell have one of the following:   diabetes, heart failure or cardio vasc disease.  This needs to be known so the patient can be enrolled in a program per Trinna Post.   Fax# [phone] Ref# [id number]

## 2023-07-08 NOTE — Telephone Encounter (Signed)
 Paperwork was filled out yesterday and completed for pt.

## 2023-07-09 ENCOUNTER — Other Ambulatory Visit: Payer: Self-pay | Admitting: Family Medicine

## 2023-07-09 DIAGNOSIS — R350 Frequency of micturition: Secondary | ICD-10-CM

## 2023-07-09 NOTE — Telephone Encounter (Signed)
 Copied from CRM [id number]. Topic: Clinical - Medication Refill >> Jul 09, 2023  4:18 PM Gildardo Pounds wrote: Most Recent Primary Care Visit:  Provider: Tora Kindred  Department: [id number]  Visit Type: MEDICARE AWV, SEQUENTIAL  Date: 05/21/2023  Medication: Dulaglutide (TRULICITY) 3 MG/0.5ML SOAJ  Has the patient contacted their pharmacy? No (Agent: If no, request that the patient contact the pharmacy for the refill. If patient does not wish to contact the pharmacy document the reason why and proceed with request.) (Agent: If yes, when and what did the pharmacy advise?)  Is this the correct pharmacy for this prescription? Yes If no, delete pharmacy and type the correct one.  This is the patient's preferred pharmacy:  [id number] - [address] [place] [address] Phone: [phone] Fax: [phone]   Has the prescription been filled recently? No  Is the patient out of the medication? Yes  Has the patient been seen for an appointment in the last year OR does the patient have an upcoming appointment? No  Can we respond through MyChart? Yes  Agent: Please be advised that Rx refills may take up to 3 business days. We ask that you follow-up with your pharmacy.

## 2023-07-10 NOTE — Telephone Encounter (Signed)
 Requested medication (s) are due for refill today - no  Requested medication (s) are on the active medication list -yes  Future visit scheduled -no  Last refill: 04/01/23 6ml 1RF  Notes to clinic: fails lab protocol- over 1 year-05/22/22, may be too soon  Requested Prescriptions  Pending Prescriptions Disp Refills   Dulaglutide (TRULICITY) 3 MG/0.5ML SOAJ 6 mL 1     Endocrinology:  Diabetes - GLP-1 Receptor Agonists Failed - 07/10/2023 12:06 PM      Failed - HBA1C is between 0 and 7.9 and within 180 days    Hgb A1c MFr Bld  Date Value Ref Range Status  05/22/2022 6.0 (H) 4.8 - 5.6 % Final    Comment:             Prediabetes: 5.7 - 6.4          Diabetes: >6.4          Glycemic control for adults with diabetes: <7.0          Passed - Valid encounter within last 6 months    Recent Outpatient Visits           3 months ago Diabetic eye exam [facility])   [place] [facility] Jacky Kindle, FNP   7 months ago Upper respiratory symptom   [place] [facility] Merita Norton T, FNP   8 months ago Arthralgia of both lower legs   [place] [facility] Simmons-Robinson, [place], MD   1 year ago Bronchitis   [place] [facility] Merita Norton T, FNP   1 year ago Type 2 diabetes mellitus with hyperglycemia, without long-term current use of insulin [facility])   [place] [facility] Merita Norton T, FNP       Future Appointments             In 4 months MacDiarmid, Lorin Picket, MD [facility] [place]               Requested Prescriptions  Pending Prescriptions Disp Refills   Dulaglutide (TRULICITY) 3 MG/0.5ML SOAJ 6 mL 1     Endocrinology:  Diabetes - GLP-1 Receptor Agonists Failed - 07/10/2023 12:06 PM      Failed - HBA1C is between 0 and 7.9 and within 180 days    Hgb A1c MFr Bld  Date Value Ref Range Status  05/22/2022 6.0 (H) 4.8 - 5.6 % Final    Comment:             Prediabetes: 5.7 -  6.4          Diabetes: >6.4          Glycemic control for adults with diabetes: <7.0          Passed - Valid encounter within last 6 months    Recent Outpatient Visits           3 months ago Diabetic eye exam [facility])   [place] [facility] Jacky Kindle, FNP   7 months ago Upper respiratory symptom   [place] [facility] Merita Norton T, FNP   8 months ago Arthralgia of both lower legs   [place] [facility] Simmons-Robinson, [place], MD   1 year ago Bronchitis   [facility] Merita Norton T, FNP   1 year ago Type 2 diabetes mellitus with hyperglycemia, without long-term current use of insulin [facility])   [place] [facility] Merita Norton  T, FNP       Future Appointments             In 4 months MacDiarmid, Lorin Picket, MD [facility]

## 2023-07-11 ENCOUNTER — Other Ambulatory Visit: Payer: Self-pay | Admitting: Family Medicine

## 2023-07-11 DIAGNOSIS — R350 Frequency of micturition: Secondary | ICD-10-CM

## 2023-07-11 DIAGNOSIS — E119 Type 2 diabetes mellitus without complications: Secondary | ICD-10-CM

## 2023-07-11 MED ORDER — TRULICITY 3 MG/0.5ML ~~LOC~~ SOAJ: 3.0000 mg | SUBCUTANEOUS | 0 refills | Status: DC

## 2023-07-11 NOTE — Telephone Encounter (Signed)
 Copied from CRM [id number]. Topic: Clinical - Medication Refill >> Jul 11, 2023 10:57 AM Kristie Cowman wrote: Most Recent Primary Care Visit:  Provider: Tora Kindred  Department[facility]  Visit Type: MEDICARE AWV, SEQUENTIAL  Date: 05/21/2023  Medication: Trulicity and metformin  Has the patient contacted their pharmacy? Yes (Agent: If no, request that the patient contact the pharmacy for the refill. If patient does not wish to contact the pharmacy document the reason why and proceed with request.) (Agent: If yes, when and what did the pharmacy advise?)  Is this the correct pharmacy for this prescription? Yes If no, delete pharmacy and type the correct one.  This is the patient's preferred pharmacy:  [id number] - [address] [place] [address] Phone: [phone] Fax: [phone]   Has the prescription been filled recently? No  Is the patient out of the medication? Yes  Has the patient been seen for an appointment in the last year OR does the patient have an upcoming appointment? Yes  Can we respond through MyChart? No  Agent: Please be advised that Rx refills may take up to 3 business days. We ask that you follow-up with your pharmacy.

## 2023-07-14 ENCOUNTER — Telehealth: Payer: Self-pay | Admitting: Family Medicine

## 2023-07-14 MED ORDER — METFORMIN HCL 500 MG PO TABS: 500.0000 mg | ORAL_TABLET | Freq: Every day | ORAL | 0 refills | Status: DC

## 2023-07-14 NOTE — Telephone Encounter (Signed)
 Rx sent. Please advise patient to come into office for further refills and labs.

## 2023-07-14 NOTE — Telephone Encounter (Signed)
 [facility] metFORMIN (GLUCOPHAGE) 500 MG tablet   Please advise

## 2023-07-14 NOTE — Telephone Encounter (Signed)
 Requested medication (s) are due for refill today: Yes  Requested medication (s) are on the active medication list: Yes  Last refill:  04/03/23  Future visit scheduled: No  Notes to clinic:  Unable to refill per protocol due to failed labs, no updated results.      Requested Prescriptions  Pending Prescriptions Disp Refills   metFORMIN (GLUCOPHAGE) 500 MG tablet 90 tablet 0    Sig: Take 1 tablet (500 mg total) by mouth daily with breakfast.     Endocrinology:  Diabetes - Biguanides Failed - 07/14/2023 11:35 AM      Failed - Cr in normal range and within 360 days    Creatinine  Date Value Ref Range Status  03/25/2014 1.16 0.60 - 1.30 mg/dL Final   Creatinine, Ser  Date Value Ref Range Status  04/03/2023 1.35 (H) 0.57 - 1.00 mg/dL Final         Failed - HBA1C is between 0 and 7.9 and within 180 days    Hgb A1c MFr Bld  Date Value Ref Range Status  05/22/2022 6.0 (H) 4.8 - 5.6 % Final    Comment:             Prediabetes: 5.7 - 6.4          Diabetes: >6.4          Glycemic control for adults with diabetes: <7.0          Failed - eGFR in normal range and within 360 days    EGFR (African American)  Date Value Ref Range Status  03/25/2014 >60 >51mL/min Final   GFR calc Af Amer  Date Value Ref Range Status  06/06/2020 63 >59 mL/min/1.73 Final    Comment:    **In accordance with recommendations from the NKF-ASN Task force,**   [facility] is in the process of updating its eGFR calculation to the   2021 CKD-EPI creatinine equation that estimates kidney function   without a race variable.    EGFR (Non-African Amer.)  Date Value Ref Range Status  03/25/2014 52 (L) >52mL/min Final    Comment:    eGFR values <16mL/min/1.73 m2 may be an indication of chronic kidney disease (CKD). Calculated eGFR, using the MRDR Study equation, is useful in  patients with stable renal function. The eGFR calculation will not be reliable in acutely ill patients when serum creatinine is  changing rapidly. It is not useful in patients on dialysis. The eGFR calculation may not be applicable to patients at the low and high extremes of body sizes, pregnant women, and vegetarians.    GFR calc non Af Amer  Date Value Ref Range Status  06/06/2020 55 (L) >59 mL/min/1.73 Final   eGFR  Date Value Ref Range Status  04/03/2023 44 (L) >59 mL/min/1.73 Final         Passed - B12 Level in normal range and within 720 days    Vitamin B-12  Date Value Ref Range Status  04/03/2023 375 232 - 1,245 pg/mL Final         Passed - Valid encounter within last 6 months    Recent Outpatient Visits           3 months ago Diabetic eye exam [facility])   [place] [facility] [facility] Jacky Kindle, FNP   7 months ago Upper respiratory symptom    [facility] Merita Norton T, FNP   8 months ago Arthralgia of both lower legs   [facility] Simmons-Robinson, Tawanna Cooler, MD   1 year ago Bronchitis   [place] [facility] Merita Norton T, FNP   1 year ago Type 2 diabetes mellitus with hyperglycemia, without long-term current use of insulin [facility])   [place] [facility] Jacky Kindle, FNP       Future Appointments             In 4 months MacDiarmid, Lorin Picket, MD [facility] [place]            Passed - CBC within normal limits and completed in the last 12 months    WBC  Date Value Ref Range Status  04/03/2023 4.7 3.4 - 10.8 x10E3/uL Final    Comment:    **Effective April 14, 2023 profile [id number] WBC will be made**   non-orderable as a stand-alone order code.   07/07/2017 13.0 (H) 4.0 - 10.5 K/uL Final   RBC  Date Value Ref Range Status  04/03/2023 3.62 (L) 3.77 - 5.28 x10E6/uL Final  07/07/2017 3.99 3.87 - 5.11 MIL/uL Final   Hemoglobin  Date Value Ref Range Status  04/03/2023 11.7 11.1 - 15.9 g/dL Final   Hematocrit  Date Value Ref Range Status  04/03/2023 35.2 34.0 - 46.6 %  Final   MCHC  Date Value Ref Range Status  04/03/2023 33.2 31.5 - 35.7 g/dL Final  56/43/3295 18.8 30.0 - 36.0 g/dL Final   [facility]  Date Value Ref Range Status  04/03/2023 32.3 26.6 - 33.0 pg Final  07/07/2017 32.1 26.0 - 34.0 pg Final   MCV  Date Value Ref Range Status  04/03/2023 97 79 - 97 fL Final  03/25/2014 98 80 - 100 fL Final   No results found for: "PLTCOUNTKUC", "LABPLAT", "POCPLA" RDW  Date Value Ref Range Status  04/03/2023 11.9 11.7 - 15.4 % Final  03/25/2014 13.3 11.5 - 14.5 % Final

## 2023-07-16 ENCOUNTER — Ambulatory Visit: Payer: Self-pay | Admitting: Family Medicine

## 2023-07-16 NOTE — Telephone Encounter (Signed)
 Copied from CRM [id number]. Topic: Clinical - Prescription Issue >> Jul 16, 2023 10:55 AM Emily Stokes wrote: Reason for CRM: Patient reached out to pharmacy for metformin rx. Patient advised they are still waiting for approval from provider. Patient is completely out   Rx showing sent on 07/14/2023.   Please assist patient further.  Chief Complaint: high blood sugar Symptoms: Sluggish, mouth really dry, dizzy at times, no energy Frequency: x few days Pertinent Negatives: Patient denies chest pain, fever Disposition: [] ED /[] Urgent Care (no appt availability in office) / [x] Appointment(In office/virtual)/ []  [facility]/ [] Home Care/ [] Refused Recommended Disposition /[] [place] [facility]/ []  Follow-up with PCP Additional Notes: pt not able to take blood sugar due to machine broke. Pt has been out of metformin for several weeks.  Pt c/o feeling sluggish, dry mouth-unable to spit at times, no energy, dizziness at times.  Pt stated not feeling as bad today but still sluggish.  Appt made in office.   Reason for Disposition  [1] Symptoms of high blood sugar (e.g., abnormally thirsty, frequent urination, weight loss) AND [2] not able to test blood glucose  Answer Assessment - Initial Assessment Questions 1. BLOOD GLUCOSE: "What is your blood glucose level?"      Unknown because glucometer broke 2. ONSET: "When did you check the blood glucose?"     Few days 3. USUAL RANGE: "What is your glucose level usually?" (e.g., usual fasting morning value, usual evening value)     unknown 4. KETONES: "Do you check for ketones (urine or blood test strips)?" If Yes, ask: "What does the test show now?"      no 5. TYPE 1 or 2:  "Do you know what type of diabetes you have?"  (e.g., Type 1, Type 2, Gestational; doesn't know)      Type 2  6. INSULIN: "Do you take insulin?" "What type of insulin(s) do you use? What is the mode of delivery? (syringe, pen; injection or pump)?"      N/a 7. DIABETES  PILLS: "Do you take any pills for your diabetes?" If Yes, ask: "Have you missed taking any pills recently?"     Out of medications for several weeks  8. OTHER SYMPTOMS: "Do you have any symptoms?" (e.g., fever, frequent urination, difficulty breathing, dizziness, weakness, vomiting)     Sluggish, mouth really dry unable to spit, dizzy at times, no energy 9. PREGNANCY: "Is there any chance you are pregnant?" "When was your last menstrual period?"     N/a  Protocols used: Diabetes - High Blood Sugar-A-AH

## 2023-07-16 NOTE — Telephone Encounter (Signed)
 Met[facility] to:  [id number] [address], [place] [address]   On 07/14/23.   Appt with Dr. Roxan Hockey on 07/17/23.

## 2023-07-17 ENCOUNTER — Ambulatory Visit: Admitting: Family Medicine

## 2023-07-17 NOTE — Progress Notes (Deleted)
 Established patient visit   Patient: Emily Stokes   DOB: 08/06/1960   63 y.o. Female  MRN: [id number] Visit Date: 07/17/2023  Today's healthcare provider: Ronnald Ramp, MD   No chief complaint on file.  Subjective       Discussed the use of AI scribe software for clinical note transcription with the patient, who gave verbal consent to proceed.  History of Present Illness             Past Medical History:  Diagnosis Date   Abdominal pain, epigastric    Acute cystitis without hematuria 04/19/2021   Anemia    Backache    Bronchitis    Calculus, kidney    Carpal tunnel syndrome    Cellulitis of face 05/30/2016   Chest pain    Chicken pox    Circulatory disease    Diabetes mellitus without complication (HCC)    Disorder of kidney and ureter    Excess, menstruation    Hypercholesteremia    Hypertension, essential, benign    Infected postoperative seroma    Infected surgical wound 09/15/2008   Iron deficiency    Leg pain 02/02/2015   Malaise and fatigue    Measles    MRSA (methicillin resistant staph aureus) culture positive    Nausea alone    Nonspecific abnormal electrocardiogram (ECG) (EKG)    Pain in elbow 02/05/2016   Pain in joint, lower leg    Pain in shoulder 12/05/2011   Sleep apnea     Medications: Outpatient Medications Prior to Visit  Medication Sig   acetaminophen (TYLENOL) 500 MG tablet Take 500 mg by mouth every 6 (six) hours as needed.   amLODipine (NORVASC) 5 MG tablet TAKE 1 TABLET BY MOUTH EVERY DAY IN THE MORNING   aspirin EC (ASPIRIN LOW DOSE) 81 MG tablet TAKE ONE TABLET BY MOUTH ONCE DAILY swallow whole   Blood Glucose Monitoring Suppl DEVI 1 each by Does not apply route daily in the afternoon. May substitute to any manufacturer covered by patient's insurance.   cephALEXin (KEFLEX) 250 MG capsule Take 1 capsule (250 mg total) by mouth daily.   Dulaglutide (TRULICITY) 3 MG/0.5ML SOAJ Inject 3 mg into the skin once a  week. INJECT 3MG  INTO THE SKIN ONCE EACH WEEK   gabapentin (NEURONTIN) 300 MG capsule Take 3 capsules (900 mg total) by mouth 3 (three) times daily.   Glucose Blood (BLOOD GLUCOSE TEST STRIPS) STRP Use to check fasting blood sugar daily. May substitute to any manufacturer covered by patient's insurance.   Ibuprofen-diphenhydrAMINE Cit (IBUPROFEN PM PO) Take by mouth at bedtime as needed.   Lancets (ONETOUCH DELICA PLUS LANCET33G) MISC USE TO check blood glucose three times daily AS DIRECTED   Lancets Misc. MISC Use to check fasting blood sugar daily. May substitute to any manufacturer covered by patient's insurance.   metFORMIN (GLUCOPHAGE) 500 MG tablet Take 1 tablet (500 mg total) by mouth daily with breakfast.   rosuvastatin (CRESTOR) 40 MG tablet Take 1 tablet (40 mg total) by mouth daily.   telmisartan (MICARDIS) 20 MG tablet Take 1 tablet (20 mg total) by mouth daily.   No facility-administered medications prior to visit.    Review of Systems  {Insert previous labs (optional):23779} {See past labs  Heme  Chem  Endocrine  Serology  Results Review (optional):1}   Objective    There were no vitals taken for this visit. {Insert last BP/Wt (optional):23777}{See vitals history (optional):1}  Physical Exam  ***  No results found for any visits on 07/17/23.  Assessment & Plan     Problem List Items Addressed This Visit       Cardiovascular and Mediastinum   Hypertension associated with diabetes (HCC)     Endocrine   Type 2 diabetes mellitus with hyperglycemia, without long-term current use of insulin (HCC) - Primary   Hyperlipidemia associated with type 2 diabetes mellitus (HCC)     Genitourinary   Stage 3a chronic kidney disease (HCC)    Assessment and Plan              No follow-ups on file.         Ronnald Ramp, MD  [facility] [phone] (phone) [phone] (fax)  [facility]

## 2023-07-21 ENCOUNTER — Ambulatory Visit: Admitting: Family Medicine

## 2023-07-21 ENCOUNTER — Encounter: Payer: Self-pay | Admitting: Family Medicine

## 2023-07-21 ENCOUNTER — Telehealth: Payer: Self-pay | Admitting: Family Medicine

## 2023-07-21 VITALS — BP 139/84 | HR 85 | Ht 69.0 in | Wt 191.8 lb

## 2023-07-21 DIAGNOSIS — E1169 Type 2 diabetes mellitus with other specified complication: Secondary | ICD-10-CM | POA: Diagnosis not present

## 2023-07-21 DIAGNOSIS — E1159 Type 2 diabetes mellitus with other circulatory complications: Secondary | ICD-10-CM | POA: Diagnosis not present

## 2023-07-21 DIAGNOSIS — E1165 Type 2 diabetes mellitus with hyperglycemia: Secondary | ICD-10-CM | POA: Diagnosis not present

## 2023-07-21 DIAGNOSIS — N3281 Overactive bladder: Secondary | ICD-10-CM

## 2023-07-21 DIAGNOSIS — E785 Hyperlipidemia, unspecified: Secondary | ICD-10-CM

## 2023-07-21 DIAGNOSIS — I152 Hypertension secondary to endocrine disorders: Secondary | ICD-10-CM

## 2023-07-21 DIAGNOSIS — R260 Ataxic gait: Secondary | ICD-10-CM

## 2023-07-21 DIAGNOSIS — R296 Repeated falls: Secondary | ICD-10-CM

## 2023-07-21 DIAGNOSIS — E119 Type 2 diabetes mellitus without complications: Secondary | ICD-10-CM

## 2023-07-21 MED ORDER — TELMISARTAN 20 MG PO TABS: 20.0000 mg | ORAL_TABLET | Freq: Every day | ORAL | 0 refills | Status: DC

## 2023-07-21 MED ORDER — BLOOD GLUCOSE TEST VI STRP: ORAL_STRIP | 3 refills | Status: DC

## 2023-07-21 MED ORDER — AMLODIPINE BESYLATE 5 MG PO TABS: 5.0000 mg | ORAL_TABLET | Freq: Every day | ORAL | 1 refills | Status: DC

## 2023-07-21 MED ORDER — ONETOUCH DELICA PLUS LANCET33G MISC: 1 refills | Status: DC

## 2023-07-21 MED ORDER — ASPIRIN 81 MG PO TBEC: DELAYED_RELEASE_TABLET | ORAL | 0 refills | Status: DC

## 2023-07-21 MED ORDER — METFORMIN HCL 500 MG PO TABS: 500.0000 mg | ORAL_TABLET | Freq: Every day | ORAL | 1 refills | Status: DC

## 2023-07-21 MED ORDER — TRULICITY 3 MG/0.5ML ~~LOC~~ SOAJ: 3.0000 mg | SUBCUTANEOUS | 1 refills | Status: DC

## 2023-07-21 MED ORDER — BLOOD GLUCOSE MONITORING SUPPL DEVI: 1.0000 | Freq: Every day | 1 refills | Status: AC

## 2023-07-21 NOTE — Telephone Encounter (Addendum)
 [facility] faxed refill request for the following medications:   telmisartan (MICARDIS) 20 MG tablet    aspirin EC (ASPIRIN LOW DOSE) 81 MG tablet    Please advise.

## 2023-07-21 NOTE — Assessment & Plan Note (Signed)
 Recently seen by ED for frequent falls She is not being worked up by [facility] Has appt on 3/13 with [facility] to discuss her pain, falls, and ataxic gait MRI results pending with [facility] Pt plans to request physical therapy through [facility] as well.  Suggested ambulatory assistive device, but pt states that makes her fall more Support footwear Beware of rugs, cords, stairs - clear walkway of

## 2023-07-21 NOTE — Assessment & Plan Note (Signed)
 Chronic Seen by Urology  Was taking Keflex daily for chronic UTIs, but held by ED - recommend pt f/u with urologist for restating abx plan

## 2023-07-21 NOTE — Assessment & Plan Note (Signed)
 Chronic Last A1c January 2024 = 6.0 Pt unable to endorse home blood glucose due to broken meter Patient requires a new glucose meter - reports occasional lightheadedness, no syncope, no palpitations or chest pain -Order new glucose meter. -Check A1C and CMP -Foot exam done today -Reorder Metformin and Trulicity. -Advise patient to follow up with eye doctor for diabetic eye exam. -Continue to make conscious decisions for well balanced diet smaller portions with increase protein, fruits, veggies, water as drink of choice, decrease starches, processed foods, and saturated fats. Increase weekly exercise - 150 minutes per week.

## 2023-07-21 NOTE — Assessment & Plan Note (Signed)
 Chronic Continue Gabapentin TID Pt followed by Ortho

## 2023-07-21 NOTE — Assessment & Plan Note (Signed)
 Chronic, stable Continue Rosuvastatin 40 mg daily

## 2023-07-21 NOTE — Assessment & Plan Note (Signed)
 Chronic, borderline GOAL<130/80 Monitor at home with upper arm  cuff, L side, automatic cuff DASH diet Continue amlodipine 5 mg daily and telmisartan 20 mg daily.  Check CMP today

## 2023-07-21 NOTE — Telephone Encounter (Signed)
 Rx sent

## 2023-07-21 NOTE — Progress Notes (Signed)
 Established Patient Office Visit  Introduced to nurse practitioner role and practice setting.  All questions answered.  Discussed provider/patient relationship and expectations.   Subjective   Patient ID: Emily Stokes, female    DOB: 1960-07-05  Age: 63 y.o. MRN: [id number]  Chief Complaint  Patient presents with   Hospitalization Follow-up   Emily Stokes presents for chronic disease mgmt for HTN, HLD, and DMII. She would like a prescription for new blood glucose meter and medication refills for diabetes management.  She is seeking a new blood glucose meter for diabetes management as her insurance does not cover the continuous glucose monitor. She currently performs fingerstick blood sugar checks. She has not had her A1c checked recently.  She is out of most of her medications except for gabapentin and rosuvastatin. She is unsure about restarting Cefalexin, which was stopped during a recent hospital visit - plans to communicate this with urologist who prescribes this medication.  She experiences blurred vision - which she has an appt [facility] for and sometimes experiences numbness or tingling in her feet at night but does not find it overly bothersome.  She had went to ED two weeks ago due to increased falls. She has issues with her L leg and is awaiting physical therapy, with an upcoming appointment on 07/24/23 at [facility] to discuss plans for her lower extremity weakness, go over MRI results, and physical therapy plan.        07/21/2023    3:55 PM 05/21/2023    3:36 PM 03/06/2023   12:13 PM  Depression screen PHQ 2/9  Decreased Interest 0 0 0  Down, Depressed, Hopeless 0 0 0  PHQ - 2 Score 0 0 0  Altered sleeping 0    Tired, decreased energy 2    Change in appetite 2    Feeling bad or failure about yourself  0    Trouble concentrating 0    Moving slowly or fidgety/restless 0    Suicidal thoughts 0    PHQ-9 Score 4         07/21/2023    3:55 PM 02/08/2022     8:47 AM  GAD 7 : Generalized Anxiety Score  Nervous, Anxious, on Edge 0 0  Control/stop worrying 0 0  Worry too much - different things 0 0  Trouble relaxing 0 0  Restless 0 0  Easily annoyed or irritable 0 0  Afraid - awful might happen 0 0  Total GAD 7 Score 0 0  Anxiety Difficulty  Not difficult at all     Review of Systems  All other systems reviewed and are negative.   Negative unless indicated in HPI   Objective:     BP 139/84   Pulse 85   Ht 5\' 9"  (1.753 m)   Wt 191 lb 12.8 oz (87 kg)   SpO2 97%   BMI 28.32 kg/m    Physical Exam Constitutional:      General: She is not in acute distress.    Appearance: Normal appearance. She is overweight. She is not toxic-appearing or diaphoretic.  HENT:     Head: Normocephalic.     Nose: Nose normal.     Mouth/Throat:     Mouth: Mucous membranes are moist.     Pharynx: Oropharynx is clear.  Eyes:     Extraocular Movements: Extraocular movements intact.     Pupils: Pupils are equal, round, and reactive to light.  Cardiovascular:     Rate  and Rhythm: Normal rate and regular rhythm.     Pulses:          Dorsalis pedis pulses are 2+ on the right side and 2+ on the left side.       Posterior tibial pulses are 2+ on the right side and 2+ on the left side.     Heart sounds: Normal heart sounds. No murmur heard.    No friction rub. No gallop.  Pulmonary:     Effort: Pulmonary effort is normal. No respiratory distress.     Breath sounds: Normal breath sounds. No stridor or decreased air movement. No wheezing, rhonchi or rales.  Chest:     Chest wall: No tenderness.  Musculoskeletal:     Right lower leg: No edema.     Left lower leg: No edema.     Right foot: Normal range of motion. No deformity, bunion or foot drop.     Left foot: Normal range of motion. No deformity, bunion or foot drop.  Feet:     Right foot:     Protective Sensation: 10 sites tested.  10 sites sensed.     Skin integrity: Skin integrity normal.      Toenail Condition: Right toenails are abnormally thick.     Left foot:     Protective Sensation: 10 sites tested.  10 sites sensed.     Skin integrity: Skin integrity normal.     Toenail Condition: Left toenails are normal.  Skin:    General: Skin is warm and dry.     Capillary Refill: Capillary refill takes less than 2 seconds.  Neurological:     General: No focal deficit present.     Mental Status: She is alert and oriented to person, place, and time. Mental status is at baseline.     Gait: Gait abnormal.     Comments: Chronic ataxic gait  Psychiatric:        Mood and Affect: Mood normal.        Behavior: Behavior normal.        Thought Content: Thought content normal.        Judgment: Judgment normal.     No results found for any visits on 07/21/23.    The 10-year ASCVD risk score (Arnett DK, et al., 2019) is: 19.1%    Assessment & Plan:  Hypertension associated with diabetes [facility]) Assessment & Plan: Chronic, borderline GOAL<130/80 Monitor at home with upper arm  cuff, L side, automatic cuff DASH diet Continue amlodipine 5 mg daily and telmisartan 20 mg daily.  Check CMP today  Orders: -     Comprehensive metabolic panel -     amLODIPine Besylate; Take 1 tablet (5 mg total) by mouth daily.  Dispense: 90 tablet; Refill: 1  Type 2 diabetes mellitus with hyperglycemia, without long-term current use of insulin [facility]) Assessment & Plan: Chronic Last A1c January 2024 = 6.0 Pt unable to endorse home blood glucose due to broken meter Patient requires a new glucose meter - reports occasional lightheadedness, no syncope, no palpitations or chest pain -Order new glucose meter. -Check A1C and CMP -Foot exam done today -Reorder Metformin and Trulicity. -Advise patient to follow up with eye doctor for diabetic eye exam. -Continue to make conscious decisions for well balanced diet smaller portions with increase protein, fruits, veggies, water as drink of choice, decrease  starches, processed foods, and saturated fats. Increase weekly exercise - 150 minutes per week.    Orders: -  Hemoglobin A1c -     Blood Glucose Monitoring Suppl; 1 each by Does not apply route daily in the afternoon. May substitute to any manufacturer covered by patient's insurance.  Dispense: 1 each; Refill: 1 -     Blood Glucose Test; Use to check fasting blood sugar daily. May substitute to any manufacturer covered by patient's insurance.  Dispense: 100 strip; Refill: 3 -     Trulicity; Inject 3 mg into the skin once a week. INJECT 3MG  INTO THE SKIN ONCE EACH WEEK  Dispense: 6 mL; Refill: 1 -     OneTouch Delica Plus Lancet33G; USE TO check blood glucose three times daily AS DIRECTED  Dispense: 100 each; Refill: 1 -     metFORMIN HCl; Take 1 tablet (500 mg total) by mouth daily with breakfast.  Dispense: 90 tablet; Refill: 1  Hyperlipidemia associated with type 2 diabetes mellitus (HCC) Assessment & Plan: Chronic, stable Continue Rosuvastatin 40 mg daily   Ataxic gait Assessment & Plan: Chronic Continue Gabapentin TID Pt followed by Ortho   Frequent falls Assessment & Plan: Recently seen by ED for frequent falls She is not being worked up by [facility] Has [facility]/13 with [facility] to discuss her pain, falls, and ataxic gait MRI results pending with [facility] Pt plans to request physical therapy through [facility] as well.  Suggested ambulatory assistive device, but pt states that makes her fall more Support footwear Beware of rugs, cords, stairs - clear walkway of     OAB (overactive bladder) Assessment & Plan: Chronic Seen by Urology  Was taking Keflex daily for chronic UTIs, but held by ED - recommend pt f/u with urologist for restating abx plan     Will communicate lab results.  Return in about 4 months (around 11/20/2023) for BP and DM management.   I, Sallee Provencal, FNP, have reviewed all documentation for this visit. The documentation on 07/21/23 for the  exam, diagnosis, procedures, and orders are all accurate and complete.   Sallee Provencal, FNP

## 2023-07-22 ENCOUNTER — Encounter: Payer: Self-pay | Admitting: Family Medicine

## 2023-07-22 LAB — HEMOGLOBIN A1C
Est. average glucose Bld gHb Est-mCnc: 126 mg/dL
Hgb A1c MFr Bld: 6 % — ABNORMAL HIGH (ref 4.8–5.6)

## 2023-07-22 LAB — COMPREHENSIVE METABOLIC PANEL
ALT: 21 IU/L (ref 0–32)
AST: 18 IU/L (ref 0–40)
Albumin: 4.1 g/dL (ref 3.9–4.9)
Alkaline Phosphatase: 100 IU/L (ref 44–121)
BUN/Creatinine Ratio: 12 (ref 12–28)
BUN: 21 mg/dL (ref 8–27)
Bilirubin Total: 0.4 mg/dL (ref 0.0–1.2)
CO2: 21 mmol/L (ref 20–29)
Calcium: 9.6 mg/dL (ref 8.7–10.3)
Chloride: 110 mmol/L — ABNORMAL HIGH (ref 96–106)
Creatinine, Ser: 1.74 mg/dL — ABNORMAL HIGH (ref 0.57–1.00)
Globulin, Total: 2.7 g/dL (ref 1.5–4.5)
Glucose: 144 mg/dL — ABNORMAL HIGH (ref 70–99)
Potassium: 4.5 mmol/L (ref 3.5–5.2)
Sodium: 144 mmol/L (ref 134–144)
Total Protein: 6.8 g/dL (ref 6.0–8.5)
eGFR: 33 mL/min/{1.73_m2} — ABNORMAL LOW (ref >59)

## 2023-07-28 ENCOUNTER — Ambulatory Visit: Payer: Self-pay | Admitting: Family Medicine

## 2023-07-28 NOTE — Telephone Encounter (Signed)
 Copied from CRM [id number]. Topic: Clinical - Lab/Test Results >> Jul 28, 2023  2:08 PM Emily Stokes wrote: Reason for CRM: not quite understanding labs   Chief Complaint: Lab Results Questions Symptoms: n/a Frequency: n/a Pertinent Negatives: Patient denies any new complaints Disposition: [] ED /[] Urgent Care (no appt availability in office) / [] Appointment(In office/virtual)/ []  [place] [facility]/ [] Home Care/ [] Refused Recommended Disposition /[] [place] [facility]/ []  Follow-up with PCP Additional Notes: Patient called for her lab results. Lab results were read to patient verbatim from provider's notes.  Patient had no additional questions at this time.   Patient added that she has been seeing a urologist for urinary tract infection concerns.  She said nothing was different with this and they were taking care of that concern.   She is advised that if she has any other questions, to call us back and if anything gets worse or changes with her or her urinary history to either contact us, the urologist, or go straight to the Emergency Room for further evaluation.  Patient verbalized understanding and had no further questions at this time.  Reason for Disposition . Caller requesting lab results  (Exception: Routine or non-urgent lab result.)  Protocols used: PCP Call - No Triage-A-AH

## 2023-09-30 ENCOUNTER — Other Ambulatory Visit: Payer: Self-pay | Admitting: Family Medicine

## 2023-09-30 DIAGNOSIS — G894 Chronic pain syndrome: Secondary | ICD-10-CM

## 2023-09-30 DIAGNOSIS — M5416 Radiculopathy, lumbar region: Secondary | ICD-10-CM

## 2023-09-30 DIAGNOSIS — M48062 Spinal stenosis, lumbar region with neurogenic claudication: Secondary | ICD-10-CM

## 2023-09-30 DIAGNOSIS — E1165 Type 2 diabetes mellitus with hyperglycemia: Secondary | ICD-10-CM

## 2023-09-30 MED ORDER — GABAPENTIN 300 MG PO CAPS: 900.0000 mg | ORAL_CAPSULE | Freq: Three times a day (TID) | ORAL | 11 refills | Status: AC

## 2023-09-30 MED ORDER — METFORMIN HCL 500 MG PO TABS: 500.0000 mg | ORAL_TABLET | Freq: Every day | ORAL | 1 refills | Status: DC

## 2023-09-30 NOTE — Telephone Encounter (Signed)
 Copied from CRM [id number]. Topic: Clinical - Medication Refill >> Sep 30, 2023 12:50 PM Lesta Rater S wrote: Medication: gabapentin  (NEURONTIN ) 300 MG capsule  metFORMIN  (GLUCOPHAGE ) 500 MG tablet   Has the patient contacted their pharmacy? Yes (Agent: If no, request that the patient contact the pharmacy for the refill. If patient does not wish to contact the pharmacy document the reason why and proceed with request.) (Agent: If yes, when and what did the pharmacy advise?) Unable to reach pharmacy  This is the patient's preferred pharmacy:  [id number] [place] [facility] - [address] [place] [address] Phone: [phone] Fax: [phone]  Is this the correct pharmacy for this prescription? Yes If no, delete pharmacy and type the correct one.   Has the prescription been filled recently? Yes  Is the patient out of the medication? Yes  Has the patient been seen for an appointment in the last year OR does the patient have an upcoming appointment? Yes  Can we respond through MyChart? Yes  Agent: Please be advised that Rx refills may take up to 3 business days. We ask that you follow-up with your pharmacy.

## 2023-10-03 ENCOUNTER — Ambulatory Visit (INDEPENDENT_AMBULATORY_CARE_PROVIDER_SITE_OTHER): Admitting: Nurse Practitioner

## 2023-10-14 ENCOUNTER — Encounter: Payer: Self-pay | Admitting: Family Medicine

## 2023-10-14 ENCOUNTER — Ambulatory Visit (INDEPENDENT_AMBULATORY_CARE_PROVIDER_SITE_OTHER): Payer: Self-pay | Admitting: Family Medicine

## 2023-10-14 VITALS — BP 113/62 | HR 80 | Ht 69.0 in | Wt 184.5 lb

## 2023-10-14 DIAGNOSIS — E119 Type 2 diabetes mellitus without complications: Secondary | ICD-10-CM

## 2023-10-14 DIAGNOSIS — E1159 Type 2 diabetes mellitus with other circulatory complications: Secondary | ICD-10-CM

## 2023-10-14 DIAGNOSIS — R3989 Other symptoms and signs involving the genitourinary system: Secondary | ICD-10-CM

## 2023-10-14 DIAGNOSIS — E1169 Type 2 diabetes mellitus with other specified complication: Secondary | ICD-10-CM

## 2023-10-14 DIAGNOSIS — M51369 Other intervertebral disc degeneration, lumbar region without mention of lumbar back pain or lower extremity pain: Secondary | ICD-10-CM

## 2023-10-14 DIAGNOSIS — M5416 Radiculopathy, lumbar region: Secondary | ICD-10-CM

## 2023-10-14 DIAGNOSIS — R319 Hematuria, unspecified: Secondary | ICD-10-CM

## 2023-10-14 DIAGNOSIS — M533 Sacrococcygeal disorders, not elsewhere classified: Secondary | ICD-10-CM

## 2023-10-14 DIAGNOSIS — E1165 Type 2 diabetes mellitus with hyperglycemia: Secondary | ICD-10-CM

## 2023-10-14 DIAGNOSIS — M47816 Spondylosis without myelopathy or radiculopathy, lumbar region: Secondary | ICD-10-CM

## 2023-10-14 DIAGNOSIS — Z1231 Encounter for screening mammogram for malignant neoplasm of breast: Secondary | ICD-10-CM

## 2023-10-14 DIAGNOSIS — D72829 Elevated white blood cell count, unspecified: Secondary | ICD-10-CM

## 2023-10-14 DIAGNOSIS — Z01818 Encounter for other preprocedural examination: Secondary | ICD-10-CM

## 2023-10-14 LAB — POCT URINALYSIS DIPSTICK
Bilirubin, UA: NEGATIVE
Glucose, UA: NEGATIVE
Ketones, UA: NEGATIVE
Nitrite, UA: NEGATIVE
Protein, UA: POSITIVE — AB
Spec Grav, UA: 1.015 (ref 1.010–1.025)
Urobilinogen, UA: 0.2 U/dL
pH, UA: 6.5 (ref 5.0–8.0)

## 2023-10-14 MED ORDER — ASPIRIN 81 MG PO TBEC: DELAYED_RELEASE_TABLET | ORAL | 0 refills | Status: DC

## 2023-10-14 MED ORDER — METFORMIN HCL 500 MG PO TABS: 500.0000 mg | ORAL_TABLET | Freq: Every day | ORAL | 2 refills | Status: AC

## 2023-10-14 MED ORDER — TRULICITY 3 MG/0.5ML ~~LOC~~ SOAJ
3.0000 mg | SUBCUTANEOUS | 1 refills | Status: AC
Start: 2023-10-14 — End: ?

## 2023-10-14 MED ORDER — AMOXICILLIN-POT CLAVULANATE 875-125 MG PO TABS: 1.0000 | ORAL_TABLET | Freq: Two times a day (BID) | ORAL | 0 refills | Status: AC

## 2023-10-14 MED ORDER — ONETOUCH DELICA PLUS LANCET33G MISC: 1 refills | Status: AC

## 2023-10-14 MED ORDER — TELMISARTAN 20 MG PO TABS
20.0000 mg | ORAL_TABLET | Freq: Every day | ORAL | 1 refills | Status: DC
Start: 2023-10-14 — End: 2024-04-05

## 2023-10-14 MED ORDER — BLOOD GLUCOSE TEST VI STRP: ORAL_STRIP | 3 refills | Status: AC

## 2023-10-14 MED ORDER — ROSUVASTATIN CALCIUM 40 MG PO TABS: 40.0000 mg | ORAL_TABLET | Freq: Every day | ORAL | 1 refills | Status: AC

## 2023-10-14 NOTE — Progress Notes (Signed)
 Established patient visit   Patient: AYAH COZZOLINO   DOB: Aug 18, 1960   63 y.o. Female  MRN: [id number] Visit Date: 10/14/2023  Today's healthcare provider: Mimi Alt, MD   Chief Complaint  Patient presents with   Pre-op Exam    Patient reports she is trying to have replacement completed but believes she has a UTI and can not get surgery if she has infection and needs to have blood sugar under control. Procedure has not been scheduled    Urinary Tract Infection    She reports recurrent cloudy malodorous urine, urinary frequency, and strong odor . The current episode started about a week ago and is gradually worsening. Patient states symptoms are moderate in intensity, occurring constantly. She  has been recently treated for similar symptoms. Reports some back pain associated. Last treatment was in January.    Subjective     HPI     Pre-op Exam    Additional comments: Patient reports she is trying to have replacement completed but believes she has a UTI and can not get surgery if she has infection and needs to have blood sugar under control. Procedure has not been scheduled         Urinary Tract Infection    Additional comments: She reports recurrent cloudy malodorous urine, urinary frequency, and strong odor . The current episode started about a week ago and is gradually worsening. Patient states symptoms are moderate in intensity, occurring constantly. She  has been recently treated for similar symptoms. Reports some back pain associated. Last treatment was in January.       Last edited by Pasty Bongo, CMA on 10/14/2023  4:12 PM.       Discussed the use of AI scribe software for clinical note transcription with the patient, who gave verbal consent to proceed.  History of Present Illness TIFFONY KITE is a 63 year old female who presents for a preoperative exam for hip replacement surgery.  She is concerned about a possible urinary tract infection  (UTI) that might delay the procedure. Symptoms include urinary frequency, cloudy urine with a strong odor, and mild back pain, which have been present for a week and are worsening. She has experienced similar symptoms in the past, with the last treatment occurring in January when a urine culture showed E. coli sensitive to Augmentin .  Her diabetes is well-controlled with a recent A1c of 6.0. However, she is currently out of her diabetes medications, including metformin  and Trulicity , due to difficulties contacting the pharmacy. She also takes gabapentin  900 mg three times a day, Crestor  40 mg for cholesterol, and telmisartan  20 mg for blood pressure.  She is managing her blood pressure with amlodipine  5 mg and has a history of a blood clot, for which vascular intervention is being considered before surgery. She is not currently on any blood thinners.  CKD is stable, last creatine was 1.7, GFR 33   No issues with breathing or chest discomfort during physical activities such as walking or climbing stairs.  Pt is a 63 y.o. female who is here for preoperative assessment for possible left hip arthroplasty  1. High Risk Cardiac Conditions  1) Recent MI - No.  2) Decompensated Heart Failure - No.  3) Unstable angina - No.  4) Symptomatic arrythmia - No.  5) Sx Valvular Disease - No.  2. Intermediate Risk Factors - DM,  - Yes.    3. Functional Status - > 4 mets  Yes.  Aaron Aas  [facility] Activity Status Index: 7.01 mets/34 points    4. Surgery Specific Risk -          Intermediate      5. Need for medical therapy - Beta Blocker, Statins indicated ? Yes.   See details for holding medications listed below   6. Anticoagulation Therapy : No.  Recommend the following medication adjustments:  - holding ASA 1 week prior to surgery - holding Trulicity  1 week prior to surgery  - holding metformin  2 days prior to surgery and for 2 days following surgery   No preoperative form was provided during today's visit,  advised patient to have surgeon's office provide form (via fax to her PCP) if needed at later date       I have independently evaluated patient.  BRYLEI PEDLEY is a 63 y.o. female who is intermediate risk for a intermediate risk surgery.  There are not  modifiable risk factors (smoking, etc).  Stanisha K Canning's RCRI/NSQIP calculation for MACE is: 1.4% risk of any complication.       Past Medical History:  Diagnosis Date   Abdominal pain, epigastric    Acute cystitis without hematuria 04/19/2021   Anemia    Backache    Bronchitis    Calculus, kidney    Carpal tunnel syndrome    Cellulitis of face 05/30/2016   Chest pain    Chicken pox    Circulatory disease    Diabetes mellitus without complication (HCC)    Disorder of kidney and ureter    Excess, menstruation    Hypercholesteremia    Hypertension, essential, benign    Infected postoperative seroma    Infected surgical wound 09/15/2008   Iron deficiency    Leg pain 02/02/2015   Malaise and fatigue    Measles    MRSA (methicillin resistant staph aureus) culture positive    Nausea alone    Nonspecific abnormal electrocardiogram (ECG) (EKG)    Pain in elbow 02/05/2016   Pain in joint, lower leg    Pain in shoulder 12/05/2011   Sleep apnea     Medications: Outpatient Medications Prior to Visit  Medication Sig   acetaminophen  (TYLENOL ) 500 MG tablet Take 500 mg by mouth every 6 (six) hours as needed.   amLODipine  (NORVASC ) 5 MG tablet Take 1 tablet (5 mg total) by mouth daily.   Blood Glucose Monitoring Suppl DEVI 1 each by Does not apply route daily in the afternoon. May substitute to any manufacturer covered by patient's insurance.   cephALEXin  (KEFLEX ) 250 MG capsule Take 1 capsule (250 mg total) by mouth daily.   gabapentin  (NEURONTIN ) 300 MG capsule Take 3 capsules (900 mg total) by mouth 3 (three) times daily.   Lancets Misc. MISC Use to check fasting blood sugar daily. May substitute to any manufacturer covered  by patient's insurance.   [DISCONTINUED] aspirin  EC (ASPIRIN  LOW DOSE) 81 MG tablet TAKE ONE TABLET BY MOUTH ONCE DAILY swallow whole   [DISCONTINUED] Dulaglutide  (TRULICITY ) 3 MG/0.5ML SOAJ Inject 3 mg into the skin once a week. INJECT 3MG  INTO THE SKIN ONCE EACH WEEK   [DISCONTINUED] Glucose Blood (BLOOD GLUCOSE TEST STRIPS) STRP Use to check fasting blood sugar daily. May substitute to any manufacturer covered by patient's insurance.   [DISCONTINUED] Lancets (ONETOUCH DELICA PLUS LANCET33G) MISC USE TO check blood glucose three times daily AS DIRECTED   [DISCONTINUED] metFORMIN  (GLUCOPHAGE ) 500 MG tablet Take 1 tablet (500 mg total) by mouth daily with breakfast.   [DISCONTINUED]  rosuvastatin  (CRESTOR ) 40 MG tablet Take 1 tablet (40 mg total) by mouth daily.   [DISCONTINUED] telmisartan  (MICARDIS ) 20 MG tablet Take 1 tablet (20 mg total) by mouth daily.   No facility-administered medications prior to visit.    Review of Systems  Last metabolic panel Lab Results  Component Value Date   GLUCOSE 144 (H) 07/21/2023   NA 144 07/21/2023   K 4.5 07/21/2023   CL 110 (H) 07/21/2023   CO2 21 07/21/2023   BUN 21 07/21/2023   CREATININE 1.74 (H) 07/21/2023   EGFR 33 (L) 07/21/2023   CALCIUM  9.6 07/21/2023   PROT 6.8 07/21/2023   ALBUMIN 4.1 07/21/2023   LABGLOB 2.7 07/21/2023   AGRATIO 1.4 02/08/2022   BILITOT 0.4 07/21/2023   ALKPHOS 100 07/21/2023   AST 18 07/21/2023   ALT 21 07/21/2023   ANIONGAP 10 07/07/2017   Last lipids Lab Results  Component Value Date   CHOL 148 04/03/2023   HDL 42 04/03/2023   LDLCALC 89 04/03/2023   TRIG 89 04/03/2023   CHOLHDL 3.5 04/03/2023   Last hemoglobin A1c Lab Results  Component Value Date   HGBA1C 6.0 (H) 07/21/2023   Last thyroid  functions Lab Results  Component Value Date   TSH 1.380 04/03/2023        Objective    BP 113/62 (BP Location: Right Arm, Patient Position: Sitting, Cuff Size: Normal)   Pulse 80   Ht 5\' 9"  (1.753 m)    Wt 184 lb 8 oz (83.7 kg)   SpO2 99%   BMI 27.25 kg/m  BP Readings from Last 3 Encounters:  10/14/23 113/62  07/21/23 139/84  05/26/23 131/78   Wt Readings from Last 3 Encounters:  10/14/23 184 lb 8 oz (83.7 kg)  07/21/23 191 lb 12.8 oz (87 kg)  05/26/23 198 lb (89.8 kg)        Physical Exam  General: Alert, no acute distress Cardio: Normal S1 and S2, RRR, no r/m/g Pulm: CTAB, normal work of breathing ABD: soft, abdomen is not distended, there is no tenderness to palpation, normal BS  Extremities: no LE edema    Results for orders placed or performed in visit on 10/14/23  Urine Microscopic  Result Value Ref Range   WBC, UA >30 (A) 0 - 5 /hpf   RBC, Urine 0-2 0 - 2 /hpf   Epithelial Cells (non renal) 0-10 0 - 10 /hpf   Casts None seen None seen /lpf   Bacteria, UA Many (A) None seen/Few  Specimen status report  Result Value Ref Range   specimen status report Comment   POCT Urinalysis Dipstick  Result Value Ref Range   Color, UA     Clarity, UA     Glucose, UA Negative Negative   Bilirubin, UA negative    Ketones, UA negative    Spec Grav, UA 1.015 1.010 - 1.025   Blood, UA moderate    pH, UA 6.5 5.0 - 8.0   Protein, UA Positive (A) Negative   Urobilinogen, UA 0.2 0.2 or 1.0 E.U./dL   Nitrite, UA negative    Leukocytes, UA Moderate (2+) (A) Negative   Appearance     Odor      Assessment & Plan     Problem List Items Addressed This Visit       Cardiovascular and Mediastinum   Hypertension associated with diabetes (HCC)   Relevant Medications   aspirin  EC (ASPIRIN  LOW DOSE) 81 MG tablet   Dulaglutide  (TRULICITY ) 3  MG/0.5ML SOAJ   metFORMIN  (GLUCOPHAGE ) 500 MG tablet   rosuvastatin  (CRESTOR ) 40 MG tablet   telmisartan  (MICARDIS ) 20 MG tablet     Endocrine   Type 2 diabetes mellitus with hyperglycemia, without long-term current use of insulin (HCC)   Relevant Medications   aspirin  EC (ASPIRIN  LOW DOSE) 81 MG tablet   Dulaglutide  (TRULICITY ) 3  MG/0.5ML SOAJ   Glucose Blood (BLOOD GLUCOSE TEST STRIPS) STRP   Lancets (ONETOUCH DELICA PLUS LANCET33G) MISC   metFORMIN  (GLUCOPHAGE ) 500 MG tablet   rosuvastatin  (CRESTOR ) 40 MG tablet   telmisartan  (MICARDIS ) 20 MG tablet   Hyperlipidemia associated with type 2 diabetes mellitus (HCC)   Relevant Medications   aspirin  EC (ASPIRIN  LOW DOSE) 81 MG tablet   Dulaglutide  (TRULICITY ) 3 MG/0.5ML SOAJ   metFORMIN  (GLUCOPHAGE ) 500 MG tablet   rosuvastatin  (CRESTOR ) 40 MG tablet   telmisartan  (MICARDIS ) 20 MG tablet     Nervous and Auditory   Lumbar radiculopathy     Musculoskeletal and Integument   Sacroiliac joint dysfunction   Lumbar degenerative disc disease     Other   Facet syndrome, lumbar   Relevant Medications   aspirin  EC (ASPIRIN  LOW DOSE) 81 MG tablet   Other Visit Diagnoses       Preoperative examination    -  Primary     Hematuria, unspecified type       Relevant Orders   Urine Culture   Urine Microscopic (Completed)     Leukocytosis, unspecified type       Relevant Orders   Urine Culture   Urine Microscopic (Completed)     Suspected UTI       Relevant Orders   POCT Urinalysis Dipstick (Completed)     Type 2 diabetes mellitus without complication, without long-term current use of insulin (HCC)       Relevant Medications   aspirin  EC (ASPIRIN  LOW DOSE) 81 MG tablet   Dulaglutide  (TRULICITY ) 3 MG/0.5ML SOAJ   metFORMIN  (GLUCOPHAGE ) 500 MG tablet   rosuvastatin  (CRESTOR ) 40 MG tablet   telmisartan  (MICARDIS ) 20 MG tablet   Other Relevant Orders   Ambulatory referral to Ophthalmology     Encounter for screening mammogram for malignant neoplasm of breast       Relevant Orders   MM 3D SCREENING MAMMOGRAM BILATERAL BREAST       Assessment & Plan Urinary Tract Infection (UTI) Presents with symptoms of a UTI, including cloudy urine, mild odor, urinary frequency, and strong odor, worsening over the past week. Urinalysis shows moderate leukocytes, 2+ positive  protein, and moderate red blood cells. A urine culture will be performed to confirm the presence of E. coli, previously sensitive to Augmentin . Augmentin  is chosen due to its previous effectiveness against E. coli, with the aim of resolving symptoms before hip replacement surgery. - Prescribe Augmentin  875 mg/125 mg twice a day for 7 days - Perform urine culture to confirm bacterial sensitivity  Chronic Pain due to Hip Osteoarthritis Experiencing chronic left hip pain and is scheduled for hip replacement surgery. Surgery is pending medical optimization and clearance from various specialists. Pain management and surgical intervention are necessary for improved quality of life. - Coordinate with vascular surgery for potential filter placement before surgery - Ensure clearance from cardiology and urology before proceeding with hip replacement surgery  Deep Vein Thrombosis (DVT) Vascular surgery is considering a filter placement before the hip replacement surgery to prevent potential complications during surgery. - Coordinate with vascular surgery for potential filter placement  before surgery  Diabetes Mellitus Type 2 Diabetes is well-controlled with a recent A1c of 6.0. Currently out of medications, including metformin  and Trulicity , due to difficulty contacting the pharmacy. Maintaining blood sugar control is crucial for surgical clearance. - Refill metformin  500 mg once a day - Ensure Trulicity  3 mg once a week is refilled  Hypertension Blood pressure is well-controlled at 113/62 mmHg. Running low on amlodipine  and will need a refill soon. - Refill amlodipine  5 mg for blood pressure control  Hyperlipidemia On Crestor  40 mg for cholesterol management. No issues reported with this medication. - Ensure Crestor  40 mg is refilled   Preoperative Medical Optimization    I have independently evaluated patient.  DEMAYA HARDGE is a 63 y.o. female who is low risk for a intermediate risk surgery.   There are not  modifiable risk factors (smoking, etc).  Kanyla K Mozley's RCRI/NSQIP calculation for MACE is: 1.4% risk of any complication.      Return in about 4 months (around 02/13/2024) for CHRONIC F/U.         Mimi Alt, MD  [facility] [phone] (phone) [phone] (fax)  [facility]

## 2023-10-15 ENCOUNTER — Ambulatory Visit: Payer: Self-pay | Admitting: Family Medicine

## 2023-10-15 LAB — URINALYSIS, MICROSCOPIC ONLY
Casts: NONE SEEN /LPF
WBC, UA: >30 /HPF — AB (ref 0–5)

## 2023-10-15 LAB — SPECIMEN STATUS REPORT

## 2023-10-15 NOTE — Patient Instructions (Signed)
 Recommend the following medication adjustments:  - holding ASA 1 week prior to surgery - holding Trulicity  1 week prior to surgery  - holding metformin  2 days prior to surgery and for 2 days following surgery

## 2023-10-17 LAB — URINE CULTURE

## 2023-10-17 LAB — SPECIMEN STATUS REPORT

## 2023-11-17 ENCOUNTER — Ambulatory Visit (INDEPENDENT_AMBULATORY_CARE_PROVIDER_SITE_OTHER): Payer: Self-pay | Admitting: Urology

## 2023-11-17 VITALS — BP 123/75 | HR 96 | Ht 69.0 in | Wt 191.0 lb

## 2023-11-17 DIAGNOSIS — R350 Frequency of micturition: Secondary | ICD-10-CM

## 2023-11-17 DIAGNOSIS — N3020 Other chronic cystitis without hematuria: Secondary | ICD-10-CM

## 2023-11-17 LAB — MICROSCOPIC EXAMINATION
Epithelial Cells (non renal): >10 /HPF — AB (ref 0–10)
WBC, UA: >30 /HPF — AB (ref 0–5)

## 2023-11-17 LAB — URINALYSIS, COMPLETE
Bilirubin, UA: NEGATIVE
Glucose, UA: NEGATIVE
Ketones, UA: NEGATIVE
Nitrite, UA: POSITIVE — AB
RBC, UA: NEGATIVE
Specific Gravity, UA: 1.02 (ref 1.005–1.030)
Urobilinogen, Ur: 0.2 mg/dL (ref 0.2–1.0)
pH, UA: 6 (ref 5.0–7.5)

## 2023-11-17 MED ORDER — CEPHALEXIN 250 MG PO CAPS: 250.0000 mg | ORAL_CAPSULE | Freq: Every day | ORAL | 11 refills | Status: DC

## 2023-11-17 MED ORDER — AMOXICILLIN-POT CLAVULANATE 875-125 MG PO TABS: 1.0000 | ORAL_TABLET | Freq: Two times a day (BID) | ORAL | 0 refills | Status: DC

## 2023-11-17 NOTE — Progress Notes (Signed)
 11/17/2023 1:09 PM   Emily Stokes [birth year] [id number]  Referring provider: Wellington Blissett LABOR, FNP [address] [place],  [place] [address]  Chief Complaint  Patient presents with   Follow-up   Urinary Frequency    HPI: I was consulted to assess the patient for recurrent bladder infections.  She complains of worsening urge incontinence frequency and foul-smelling urine that respond favorably to antibiotics.   At baseline she voids every 2 hours and gets up 3 times at night.  Sometimes she leaks with coughing sneezing.  Sometimes she has urge incontinence.  Most days she does not wear a pad but sometimes she does   She thinks she may be infected today   She has had a hysterectomy kidney stone   She has had positive urine cultures in the medical record     Pathophysiology of UTIs discussed. Call if renal ultrasound is normal. Call in ciprofloxacin  250 mg twice a day for 7 days. Then placed patient on nitrofurantoin  100 mg 30 x 11. Have patient come back and see me for cystoscopy in approximately 6 to 7 weeks    Today Frequency stable.  Last culture negative.  She had a lesion noted on ultrasound so I ordered a CT scan.  She had bilateral renal cortical scarring with some general atrophy of the right kidney likely from infection.  She was cleared on CT scan for renal cyst with no cancer.   Patient never took the daily Macrodantin .  Clinically not infected today but she was a bit vague   On pelvic examination she is a mild cystocele no stress incontinence.   Cystoscopy: Patient went flexible cystoscopy.  Urine was very cloudy.  There was a lot of sediment.  I looked carefully around the bladder.  It was more challenging to see the urothelium.  No carcinoma.  She has never smoked.   Patient looks like she had a bladder infection by urinalysis and cystoscopy today.  The last culture demonstrated a lot of resistance and based upon allergies I called and Augmentin  1  tablet twice a day for 7 days.  She would then start daily Macrodantin  100 mg 30 x 11 based on allergies.  Reassess in 8 weeks.  Call if culture differs    Today Frequency stable.  Last culture positive Patient is on daily Macrodantin  but now has foul-smelling urine and frequency and urgency incontinence.  She gets hives from sulfa  drug.  She was cath and urine was sent for culture     Urine reviewed and sent for culture. She has allergies as noted. Based upon last culture I called and Augmentin  1 tablet twice a day for 7 days. She would then go on daily Keflex  250 mg daily 30 x 11 and reassess 3 months    Today Frequency stable.  Last culture was positive.  Patient had stopped the medication and I urged her to continue it.  Clinically not infected.  If culture positive I will treat it. This would not be necessarily true breakthrough. Make sure prescription has been sent. Reassess 6 months   Today Frequency stable.  Culture was positive last visit in January 2025.  I saw another positive culture October 14, 2023 but for some reason once again she had gone off her prophylaxis.  She thinks he is infected now with some frequency and foul-smelling urine.  These are the symptoms she had in early June.    PMH: Past Medical History:  Diagnosis Date  Abdominal pain, epigastric    Acute cystitis without hematuria 04/19/2021   Anemia    Backache    Bronchitis    Calculus, kidney    Carpal tunnel syndrome    Cellulitis of face 05/30/2016   Chest pain    Chicken pox    Circulatory disease    Diabetes mellitus without complication (HCC)    Disorder of kidney and ureter    Excess, menstruation    Hypercholesteremia    Hypertension, essential, benign    Infected postoperative seroma    Infected surgical wound 09/15/2008   Iron deficiency    Leg pain 02/02/2015   Malaise and fatigue    Measles    MRSA (methicillin resistant staph aureus) culture positive    Nausea alone    Nonspecific  abnormal electrocardiogram (ECG) (EKG)    Pain in elbow 02/05/2016   Pain in joint, lower leg    Pain in shoulder 12/05/2011   Sleep apnea     Surgical History: Past Surgical History:  Procedure Laterality Date   ABDOMINAL HYSTERECTOMY     CESAREAN SECTION     COLONOSCOPY  1997   COLONOSCOPY WITH PROPOFOL  N/A 01/09/2018   Procedure: COLONOSCOPY WITH PROPOFOL ;  Surgeon: Janalyn Keene NOVAK, MD;  Location: [facility] ENDOSCOPY;  Service: Endoscopy;  Laterality: N/A;   COLONOSCOPY WITH PROPOFOL  N/A 08/20/2019   Procedure: COLONOSCOPY WITH PROPOFOL ;  Surgeon: Jinny Carmine, MD;  Location: [facility] ENDOSCOPY;  Service: Endoscopy;  Laterality: N/A;   COLOSTOMY TAKEDOWN  2012   Hartmann's procedure.     HERNIA REPAIR  2012   umbilical   LITHOTRIPSY  1997   with complications, hospitalized due to these complications for 3 months   mrsa     removal on nose   PARTIAL HYSTERECTOMY  2009    vaginal hysterectomy, has both ovaries    Home Medications:  Allergies as of 11/17/2023       Reactions   Sulfamethoxazole -trimethoprim  Swelling, Hives   Ciprofloxacin  Itching, Rash   Swollen around eyes and mouth.         Medication List        Accurate as of November 17, 2023  1:09 PM. If you have any questions, ask your nurse or doctor.          acetaminophen  500 MG tablet Commonly known as: TYLENOL  Take 500 mg by mouth every 6 (six) hours as needed.   amLODipine  5 MG tablet Commonly known as: NORVASC  Take 1 tablet (5 mg total) by mouth daily.   aspirin  EC 81 MG tablet Commonly known as: Aspirin  Low Dose TAKE ONE TABLET BY MOUTH ONCE DAILY swallow whole   Blood Glucose Monitoring Suppl Devi 1 each by Does not apply route daily in the afternoon. May substitute to any manufacturer covered by patient's insurance.   BLOOD GLUCOSE TEST STRIPS Strp Use to check fasting blood sugar daily. May substitute to any manufacturer covered by patient's insurance.   cephALEXin  250 MG capsule Commonly known  as: KEFLEX  Take 1 capsule (250 mg total) by mouth daily.   gabapentin  300 MG capsule Commonly known as: Neurontin  Take 3 capsules (900 mg total) by mouth 3 (three) times daily.   Lancets Misc. Misc Use to check fasting blood sugar daily. May substitute to any manufacturer covered by patient's insurance.   metFORMIN  500 MG tablet Commonly known as: GLUCOPHAGE  Take 1 tablet (500 mg total) by mouth daily with breakfast.   OneTouch Delica Plus Lancet33G Misc USE TO check blood glucose  three times daily AS DIRECTED   rosuvastatin  40 MG tablet Commonly known as: CRESTOR  Take 1 tablet (40 mg total) by mouth daily.   telmisartan  20 MG tablet Commonly known as: MICARDIS  Take 1 tablet (20 mg total) by mouth daily.   Trulicity  3 MG/0.5ML Soaj Generic drug: Dulaglutide  Inject 3 mg into the skin once a week. INJECT 3MG  INTO THE SKIN ONCE EACH WEEK        Allergies:  Allergies  Allergen Reactions   Sulfamethoxazole -Trimethoprim  Swelling and Hives   Ciprofloxacin  Itching and Rash    Swollen around eyes and mouth.     Family History: Family History  Problem Relation Age of Onset   Diabetes Mother    Hypertension Mother    Hypertension Father    Stroke Father    Diabetes Other        sibling   Diabetes Other        sibling   Diabetes Other        sibling   Hypertension Other        sibling   Hypertension Other        sibling   Hypertension Other        sibling    Social History:  reports that she has never smoked. She has never used smokeless tobacco. She reports that she does not drink alcohol and does not use drugs.  ROS:                                        Physical Exam: There were no vitals taken for this visit.  Constitutional:  Alert and oriented, No acute distress. HEENT:  AT, moist mucus membranes.  Trachea midline, no masses.   Laboratory Data: Lab Results  Component Value Date   WBC 4.7 04/03/2023   HGB 11.7 04/03/2023    HCT 35.2 04/03/2023   MCV 97 04/03/2023   PLT 309 04/03/2023    Lab Results  Component Value Date   CREATININE 1.74 (H) 07/21/2023    No results found for: PSA  No results found for: TESTOSTERONE   Lab Results  Component Value Date   HGBA1C 6.0 (H) 07/21/2023    Urinalysis    Component Value Date/Time   COLORURINE YELLOW 07/07/2017 1715   APPEARANCEUR Cloudy (A) 05/26/2023 1318   LABSPEC 1.009 07/07/2017 1715   LABSPEC 1.015 03/25/2014 0925   PHURINE 5.0 07/07/2017 1715   GLUCOSEU Negative 05/26/2023 1318   GLUCOSEU NEGATIVE 03/25/2014 0925   HGBUR MODERATE (A) 07/07/2017 1715   BILIRUBINUR negative 10/14/2023 1649   BILIRUBINUR Negative 05/26/2023 1318   BILIRUBINUR NEGATIVE 03/25/2014 0925   KETONESUR NEGATIVE 07/07/2017 1715   PROTEINUR Positive (A) 10/14/2023 1649   PROTEINUR 2+ (A) 05/26/2023 1318   PROTEINUR NEGATIVE 07/07/2017 1715   UROBILINOGEN 0.2 10/14/2023 1649   NITRITE negative 10/14/2023 1649   NITRITE Positive (A) 05/26/2023 1318   NITRITE POSITIVE (A) 07/07/2017 1715   LEUKOCYTESUR Moderate (2+) (A) 10/14/2023 1649   LEUKOCYTESUR 1+ (A) 05/26/2023 1318   LEUKOCYTESUR NEGATIVE 03/25/2014 0925    Pertinent Imaging: Urine reviewed and sent for culture.  Chart reviewed  Assessment & Plan: Based upon last culture I called in Augmentin  1 tablet twice a day for 7 days.  Call if culture differs.  I asked the patient if she wants go back on daily cephalexin  recognizing it may or may not prevent infections.  She says she like to try this.  We have talked about this multiple times.  Otherwise I will see in a year  1. Urinary frequency (Primary)  - Urinalysis, Complete   No follow-ups on file.  Emily DELENA Elizabeth, MD  [facility] [address], [address] [place], [place] [address] [phone]

## 2023-11-20 LAB — CULTURE, URINE COMPREHENSIVE

## 2024-01-07 ENCOUNTER — Ambulatory Visit (INDEPENDENT_AMBULATORY_CARE_PROVIDER_SITE_OTHER): Admitting: Nurse Practitioner

## 2024-01-20 ENCOUNTER — Ambulatory Visit (INDEPENDENT_AMBULATORY_CARE_PROVIDER_SITE_OTHER): Admitting: Nurse Practitioner

## 2024-02-02 ENCOUNTER — Ambulatory Visit (INDEPENDENT_AMBULATORY_CARE_PROVIDER_SITE_OTHER): Admitting: Nurse Practitioner

## 2024-02-18 ENCOUNTER — Emergency Department
Admission: EM | Admit: 2024-02-18 | Discharge: 2024-02-19 | Disposition: A | Discharge status: Home / Self Care | Attending: Emergency Medicine | Admitting: Emergency Medicine

## 2024-02-18 ENCOUNTER — Other Ambulatory Visit: Payer: Self-pay

## 2024-02-18 ENCOUNTER — Emergency Department

## 2024-02-18 DIAGNOSIS — Y92009 Unspecified place in unspecified non-institutional (private) residence as the place of occurrence of the external cause: Secondary | ICD-10-CM | POA: Diagnosis not present

## 2024-02-18 DIAGNOSIS — H1032 Unspecified acute conjunctivitis, left eye: Secondary | ICD-10-CM | POA: Diagnosis not present

## 2024-02-18 DIAGNOSIS — W108XXA Fall (on) (from) other stairs and steps, initial encounter: Secondary | ICD-10-CM | POA: Diagnosis not present

## 2024-02-18 DIAGNOSIS — M25552 Pain in left hip: Secondary | ICD-10-CM | POA: Insufficient documentation

## 2024-02-18 DIAGNOSIS — B9689 Other specified bacterial agents as the cause of diseases classified elsewhere: Secondary | ICD-10-CM | POA: Insufficient documentation

## 2024-02-18 DIAGNOSIS — E119 Type 2 diabetes mellitus without complications: Secondary | ICD-10-CM | POA: Insufficient documentation

## 2024-02-18 DIAGNOSIS — L03213 Periorbital cellulitis: Secondary | ICD-10-CM

## 2024-02-18 DIAGNOSIS — H5789 Other specified disorders of eye and adnexa: Secondary | ICD-10-CM

## 2024-02-18 MED ORDER — TETRACAINE HCL 0.5 % OP SOLN
1.0000 [drp] | Freq: Once | OPHTHALMIC | Status: DC
  Filled 2024-02-18: qty 4

## 2024-02-18 MED ORDER — CLINDAMYCIN HCL 150 MG PO CAPS: 300.0000 mg | ORAL_CAPSULE | Freq: Once | ORAL | Status: DC

## 2024-02-18 MED ORDER — OXYCODONE-ACETAMINOPHEN 5-325 MG PO TABS
1.0000 | ORAL_TABLET | Freq: Once | ORAL | Status: AC
  Administered 2024-02-18: 1 via ORAL
  Filled 2024-02-18: qty 1

## 2024-02-18 MED ORDER — AMOXICILLIN-POT CLAVULANATE 875-125 MG PO TABS: 1.0000 | ORAL_TABLET | Freq: Two times a day (BID) | ORAL | 0 refills | Status: AC

## 2024-02-18 MED ORDER — FLUORESCEIN SODIUM 1 MG OP STRP
1.0000 | ORAL_STRIP | Freq: Once | OPHTHALMIC | Status: DC
  Filled 2024-02-18: qty 1

## 2024-02-18 MED ORDER — ERYTHROMYCIN 5 MG/GM OP OINT: 1.0000 | TOPICAL_OINTMENT | Freq: Every day | OPHTHALMIC | 0 refills | Status: AC

## 2024-02-18 MED ORDER — AMOXICILLIN-POT CLAVULANATE 875-125 MG PO TABS
1.0000 | ORAL_TABLET | Freq: Once | ORAL | Status: AC
  Administered 2024-02-19: 1 via ORAL
  Filled 2024-02-18: qty 1

## 2024-02-18 NOTE — ED Triage Notes (Signed)
 Pt to ED via EMS from home, pt reports she miss stepped and fell down 6 stairs, pt c/o pain to left hip and femur, pt also noted to have swelling and redness to left eye lid, pt denies hitting her head states she woke up with her left eyelid already red. Pt is not on blood thinners.

## 2024-02-18 NOTE — ED Provider Notes (Signed)
 [facility] Provider Note    Event Date/Time   First MD Initiated Contact with Patient 02/18/24 2050     (approximate)   History   Fall   HPI  JACKIE RUSSMAN is a 63 year old female with history of T2DM presenting to the emergency department for evaluation after a fall.  Patient was walking down her steps when she missed a step and fell down 6 stairs.  Hit her left side including her left side of face, left hip.  Does note that when she woke up this morning she noticed some redness and swelling of her left eye, does feel like the swelling got worse after she fell.  Not on anticoagulation.  No LOC.  Has a history of chronic left hip pain for which she sees orthopedics.    Physical Exam   Triage Vital Signs: ED Triage Vitals  Encounter Vitals Group     BP 02/18/24 1934 110/69     Girls Systolic BP Percentile --      Girls Diastolic BP Percentile --      Boys Systolic BP Percentile --      Boys Diastolic BP Percentile --      Pulse Rate 02/18/24 1934 91     Resp 02/18/24 1934 17     Temp 02/18/24 1934 98.4 F (36.9 C)     Temp Source 02/18/24 1934 Oral     SpO2 02/18/24 1934 95 %     Weight 02/18/24 1933 190 lb (86.2 kg)     Height 02/18/24 1933 5' 7 (1.702 m)     Head Circumference --      Peak Flow --      Pain Score 02/18/24 1933 8     Pain Loc --      Pain Education --      Exclude from Growth Chart --     Most recent vital signs: Vitals:   02/18/24 1934  BP: 110/69  Pulse: 91  Resp: 17  Temp: 98.4 F (36.9 C)  SpO2: 95%    Nursing notes and vital signs reviewed.  General: Adult female, lying in bed, awake interactive  Head: There is significant swelling in the left periorbital region compared to the contralateral side.  Patient is able to partially open up her eye.  The eye is not proptotic.  There is conjunctival injection with purulent appearing overlying discharge.  There is tenderness along the lateral aspect of the orbital  wall without ecchymosis.  No pain with extraocular movements.  No abnormal fluorescein uptake, negative Sidel sign. Chest: Symmetric chest rise, no tenderness to palpation.  Cardiac: Regular rhythm and rate.  Respiratory: Lungs clear to auscultation Abdomen: Soft, nondistended. No tenderness to palpation.  Pelvis: Stable in AP and lateral compression.  MSK: No deformity to bilateral upper and lower extremity.  There is tenderness to palpation over the left hip.  Pain with attempts at range of motion of the hip.   Neuro: Alert, oriented. GCS 15. 5 out of 5 strength in bilateral upper and lower extremities. Normal sensation to light touch in bilateral upper and lower extremity. Skin: No evidence of burns or lacerations.  ED Results / Procedures / Treatments   Labs (all labs ordered are listed, but only abnormal results are displayed) Labs Reviewed - No data to display   EKG EKG independently reviewed and interpreted by myself demonstrates:    RADIOLOGY Imaging independently reviewed and interpreted by myself demonstrates:  Hip x-Corbyn Steedman without acute  fracture, radiology does note findings concerning for avascular necrosis Femur x-Florian Chauca without acute fracture  Formal Radiology Read:  DG Femur Min 2 Views Left Result Date: 02/18/2024 CLINICAL DATA:  Recent trip and fall with left leg pain, initial encounter EXAM: LEFT FEMUR 2 VIEWS COMPARISON:  None Available. FINDINGS: Chronic changes are noted in the left hip joint as previously described. No acute fracture or dislocation is noted. IMPRESSION: Chronic changes in the left hip as previously described. No other focal abnormality is noted. Electronically Signed   By: Oneil Devonshire M.D.   On: 02/18/2024 20:22   DG Hip Unilat W or Wo Pelvis 2-3 Views Left Result Date: 02/18/2024 CLINICAL DATA:  Recent fall down stairs with hip pain, initial encounter EXAM: DG HIP (WITH OR WITHOUT PELVIS) 3V LEFT COMPARISON:  08/02/2014 FINDINGS: Pelvic ring is  intact. Severe degenerative changes of the left hip joint are noted. Collapse of the left femoral head is noted. These findings are highly suspicious for avascular necrosis. No definitive fracture is seen. IMPRESSION: Changes suspicious for avascular necrosis with superior collapse of the left femoral head. Electronically Signed   By: Oneil Devonshire M.D.   On: 02/18/2024 20:21    PROCEDURES:  Critical Care performed: No  Procedures   MEDICATIONS ORDERED IN ED: Medications  fluorescein ophthalmic strip 1 strip (has no administration in time range)  tetracaine (PONTOCAINE) 0.5 % ophthalmic solution 1 drop (has no administration in time range)  oxyCODONE -acetaminophen  (PERCOCET/ROXICET) 5-325 MG per tablet 1 tablet (1 tablet Oral Given 02/18/24 2127)     IMPRESSION / MDM / ASSESSMENT AND PLAN / ED COURSE  I reviewed the triage vital signs and the nursing notes.  Differential diagnosis includes, but is not limited to, hip fracture, dislocation, soft tissue injury, conjunctivitis, periorbital cellulitis, orbital fracture, other facial trauma, low suspicion intracranial bleed, skull fracture, spine fracture, no indication for head or neck imaging by [place] and Nexus criteria, no evidence of thoracoabdominal trauma on exam  Patient's presentation is most consistent with acute presentation with potential threat to life or bodily function.  63 year old female presenting to the emergency department for evaluation after a fall, also with periorbital swelling that started before the fall.  Stable vitals on presentation.  X-rays of her hip and femur without acute traumatic injuries.  Radiology does note findings concerning for avascular necrosis which were discussed with patient.  She is already established with orthopedics and will discuss these findings with her orthopedic provider.  She was ordered for pain control with some improvement in her hip pain.  Regarding her eye, has signs of bacterial  conjunctivitis on exam, but greater than would be expected periorbital edema concerning for possible periorbital cellulitis versus possible traumatic injury.  CT of the orbits ordered to further evaluate.  Reassuring fluorescein exam.  Radiology read of CT orbits pending at time of signout.  If this is overall reassuring, suspect patient may be stable for discharge with urethritis and ointment for her suspected conjunctivitis as well as oral antibiotics for possible developing preseptal cellulitis.  If CT with significant trauma or concerns for deeper space infection may require labs, IV antibiotics, etc.  Signed out to oncoming physician at 2330 pending CT and disposition.      FINAL CLINICAL IMPRESSION(S) / ED DIAGNOSES   Final diagnoses:  Left hip pain  Periorbital swelling  Acute bacterial conjunctivitis of left eye     Rx / DC Orders   ED Discharge Orders  Ordered    erythromycin  ophthalmic ointment  Daily at bedtime        02/18/24 2333    amoxicillin -clavulanate (AUGMENTIN ) 875-125 MG tablet  2 times daily        02/18/24 2333             Note:  This document was prepared using Dragon voice recognition software and may include unintentional dictation errors.   Levander Slate, MD 02/18/24 [phone]

## 2024-02-18 NOTE — ED Provider Notes (Signed)
 Following up on the results of CT orbit.  No evidence of deeper space infection or traumatic injury.  Discharged with plan as per previous provider for preseptal cellulitis plus conjunctivitis medications.   Cyrena Mylar, MD 02/18/24 [phone]

## 2024-02-18 NOTE — Discharge Instructions (Signed)
 Take medications as prescribed.  Call your doctor for follow-up appointment.  If your eye continues to bother you despite treatment as above, you may go to [facility] for further eye evaluation or come back to the emergency department  Thank you for choosing us  for your health care today!  Please see your primary doctor this week for a follow up appointment.   If you have any new, worsening, or unexpected symptoms call your doctor right away or come back to the emergency department for reevaluation.  It was my pleasure to care for you today.   Ginnie EDISON Cyrena, MD

## 2024-02-19 ENCOUNTER — Ambulatory Visit: Payer: Self-pay

## 2024-02-19 NOTE — Telephone Encounter (Signed)
 FYI Only or Action Required?: FYI only for provider.  Patient was last seen in primary care on 10/14/2023 by Sharma Coyer, MD.  Called Nurse Triage reporting Breast Mass.  Symptoms began today.  Interventions attempted: Nothing.  Symptoms are: unchanged.  Triage Disposition: See Physician Within 24 Hours  Patient/caregiver understands and will follow disposition?: Yes     Copied from CRM [id number]. Topic: Clinical - Red Word Triage >> Feb 19, 2024 10:04 AM Darshell M wrote:  Red Word that prompted transfer to Nurse Triage: Patient fell and went to ER. This morning right breast is bruised and has a lump but she did not fall on the right side. Reason for Disposition  [1] Cuts, burns, or bruises of breasts AND [2] suspicious history for the injury  Answer Assessment - Initial Assessment Questions 1. SYMPTOM: What's the main symptom you're concerned about?  (e.g., lump, nipple discharge, pain, rash)     Bruises, painful when touched 6/10 2. LOCATION: Where is the  located?     Right breast 3. ONSET: When did   start?     today 4. PRIOR HISTORY: Do you have any history of prior problems with your breasts? (e.g., breast cancer, breast implant, fibrocystic breast disease)     fibrocystic breast disease -pt states history 5. CAUSE: What do you think is causing this symptom?     unknown 6. OTHER SYMPTOMS: Do you have any other symptoms? (e.g., breast pain, fever, nipple discharge, redness or rash)     pain 7. PREGNANCY-BREASTFEEDING: Is there any chance you are pregnant? When was your last menstrual period? Are you breastfeeding?     Na  Patient fell and landed on left side yesterday and went to ER.  Protocols used: Breast Symptoms-A-AH

## 2024-02-20 ENCOUNTER — Ambulatory Visit (INDEPENDENT_AMBULATORY_CARE_PROVIDER_SITE_OTHER): Admitting: Family Medicine

## 2024-02-20 ENCOUNTER — Encounter: Payer: Self-pay | Admitting: Family Medicine

## 2024-02-20 VITALS — BP 129/80 | HR 89 | Ht 69.0 in | Wt 190.0 lb

## 2024-02-20 DIAGNOSIS — E1159 Type 2 diabetes mellitus with other circulatory complications: Secondary | ICD-10-CM

## 2024-02-20 DIAGNOSIS — E1122 Type 2 diabetes mellitus with diabetic chronic kidney disease: Secondary | ICD-10-CM | POA: Diagnosis not present

## 2024-02-20 DIAGNOSIS — E1169 Type 2 diabetes mellitus with other specified complication: Secondary | ICD-10-CM

## 2024-02-20 DIAGNOSIS — E785 Hyperlipidemia, unspecified: Secondary | ICD-10-CM

## 2024-02-20 DIAGNOSIS — E559 Vitamin D deficiency, unspecified: Secondary | ICD-10-CM

## 2024-02-20 DIAGNOSIS — N1832 Chronic kidney disease, stage 3b: Secondary | ICD-10-CM

## 2024-02-20 DIAGNOSIS — S2001XA Contusion of right breast, initial encounter: Secondary | ICD-10-CM | POA: Diagnosis not present

## 2024-02-20 DIAGNOSIS — S0512XD Contusion of eyeball and orbital tissues, left eye, subsequent encounter: Secondary | ICD-10-CM

## 2024-02-20 DIAGNOSIS — N3020 Other chronic cystitis without hematuria: Secondary | ICD-10-CM

## 2024-02-20 DIAGNOSIS — I152 Hypertension secondary to endocrine disorders: Secondary | ICD-10-CM

## 2024-02-20 MED ORDER — TRAMADOL HCL 25 MG PO TABS: 25.0000 mg | ORAL_TABLET | Freq: Three times a day (TID) | ORAL | 0 refills | Status: AC | PRN

## 2024-02-20 MED ORDER — LIDOCAINE 5 % EX PTCH
1.0000 | MEDICATED_PATCH | CUTANEOUS | 0 refills | Status: AC
Start: 2024-02-20 — End: ?

## 2024-02-20 MED ORDER — AMLODIPINE BESYLATE 5 MG PO TABS: 5.0000 mg | ORAL_TABLET | Freq: Every day | ORAL | 1 refills | Status: AC

## 2024-02-20 NOTE — Progress Notes (Signed)
 Acute Office Visit  Introduced to nurse practitioner role and practice setting.  All questions answered.  Discussed provider/patient relationship and expectations.   Subjective:     Patient ID: Emily Stokes, female    DOB: [birth year], 63 y.o.   MRN: [id number]  Chief Complaint  Patient presents with   Acute Visit    breast pain & lump RNTriage on 02/19/24 Patient reported she fell and went to ER. This morning right breast is bruised and has a lump but she did not fall on the right side. Patient reported symptoms of bruises and painful when touched. She reports hx of fibrocystic breast disease.    Breast Pain    Patient reports pain is about the same and tylenol  is not helping. Reports when she fell she didn't realize she bruised her breast until the next day. Area is swollen and possibly has a knot   Discussed the use of AI scribe software for clinical note transcription with the patient, who gave verbal consent to proceed.  History of Present Illness Emily Stokes is a 63 year old female who presents post fall resulting in bruising on R breast and edematous L eyelid/orbital  She experienced a fall on Tuesday afternoon while descending stairs, missing a step and falling to the bottom. She went to the ED - received an x-ray of her leg and a CT scan were performed in the emergency department; she reports that she was told there were no fractures. She has been experiencing pain in her leg and breast since the fall, and Tylenol  has not been effective in managing the pain.  Following the fall, she noticed bruising and swelling around her eye, initially the size of a golf ball. She was prescribed erythromycin  eye ointment and amoxicillin  for her eye. The swelling has decreased, but she continues to experience blurry vision. She has not yet followed up with an eye doctor, and still needs annual retinopathy screening for her diabetes.  She reports a lump and pain in her right breast that  developed after the fall. She has been using warm compresses. She has not had a recent mammogram and is unsure of the last time she had one.  She has a history of urinary tract infections and mentions running out of her medication, which she believes was Keflex - urology prescribes.  HPI  ROS      Objective:    BP 129/80 (BP Location: Left Arm, Patient Position: Sitting, Cuff Size: Large)   Pulse 89   Ht 5' 9 (1.753 m)   Wt 190 lb (86.2 kg)   SpO2 100%   BMI 28.06 kg/m    Physical Exam Constitutional:      General: She is not in acute distress.    Appearance: Normal appearance. She is not ill-appearing, toxic-appearing or diaphoretic.  HENT:     Head: Normocephalic.     Nose: Nose normal.     Mouth/Throat:     Mouth: Mucous membranes are moist.     Pharynx: Oropharynx is clear.  Eyes:     General: Vision grossly intact.     Extraocular Movements: Extraocular movements intact.     Right eye: Normal extraocular motion.     Left eye: Normal extraocular motion.     Conjunctiva/sclera:     Left eye: Left conjunctiva is injected.     Pupils: Pupils are equal, round, and reactive to light.     Comments: L eye watery, upper and lower lids swollen, injected  conjunctiva from fall.   Neck:     Vascular: No carotid bruit.  Cardiovascular:     Rate and Rhythm: Normal rate and regular rhythm.     Pulses: Normal pulses.     Heart sounds: Normal heart sounds. No murmur heard.    No friction rub. No gallop.  Pulmonary:     Effort: No respiratory distress.     Breath sounds: No stridor. No wheezing, rhonchi or rales.  Chest:     Chest wall: Swelling and tenderness present.  Breasts:    Right: Swelling and tenderness present.     Left: Normal.       Comments: Bruising and hematoma palpated on R breast, tenderness to palpitation Musculoskeletal:     Right lower leg: No edema.     Left lower leg: No edema.  Lymphadenopathy:     Cervical: No cervical adenopathy.     Upper  Body:     Right upper body: No supraclavicular, axillary or pectoral adenopathy.     Left upper body: No supraclavicular, axillary or pectoral adenopathy.  Skin:    General: Skin is warm and dry.     Capillary Refill: Capillary refill takes less than 2 seconds.  Neurological:     General: No focal deficit present.     Mental Status: She is alert and oriented to person, place, and time. Mental status is at baseline.  Psychiatric:        Mood and Affect: Mood normal.        Behavior: Behavior normal.        Thought Content: Thought content normal.        Judgment: Judgment normal.     No results found for any visits on 02/20/24.      Assessment & Plan:  Assessment and Plan Assessment & Plan Right breast hematoma Right breast hematoma following a fall down the stairs on Tuesday. No fractures on imaging. Likely due to bruising and muscular injury. Tender and swollen, no nodules felt. Differential includes hematoma versus other breast pathology, but hematoma is most likely. - large area of bruising light to dark purpl on anterior breast, able to palpate what appears to be hematoma, about 3x2inches in size - pt states new as of fall, no lumps or bumps prior to fall.  - Order breast ultrasound to evaluate hematoma. Ok to cancel if symptoms improve. - Advise alternating ice and heat application to reduce swelling and promote healing. - Prescribe lidocaine  patch for pain management. - Prescribe short-term tramadol  25mg  prn every 8 hours for pain relief.  Left Eye contusion/ Swelling Left leg contusion following a fall down the stairs. No fractures on imaging. Pain persists despite Tylenol . No muscle spasms reported, just pain. - EOMs intact - warm compresses to eye to reduce swelling - finish Augmentin  and erythromycin  prescribed by ED - Prescribe short-term tramadol  for pain relief. - Advise alternating ice and heat application to reduce swelling and promote healing. - Highly advise  urgent follow up with ophthalmology given swelling and vision impaired - pt agreeable to call and schedule. Swelling is improving. EOMs intact.  Type 2 diabetes mellitus w/ CKD stage 3b Chronic, controlled - previous A1c = 6.0 March 2025 - recheck A1c today - continue metformin  500mg  daily - continue Trulicity  3 mg weekly - continue fasting glucose checks - Order lab work including A1C - on statin and ace - recommend eye screening - UTD on foot exam - recheck CMP  Chronic Pain Syndrome -  hx of Lumbar radiculopathy  - hx of spinal stenosis with neurogenic effects - continue gabapentin  900mg  TID  Hypertension Chronic, stable - continue amlodipine  5mg  daily - continue telmisartan  20mg  daily  HLD - chronic - check lipid panel - GOAL LDL<70 - continue rosuvastatin  40mg  daily  Chronic Cystitis  - Ran out of cephalexin  250mg  daily for prophylasix - filled by urology  - appears to have refills - Advise contacting prescribing physician for medication refill.  Vitamin D  Deficiency - recheck levels  Recommend Breast cancer screening by mammogram    Problem List Items Addressed This Visit       Cardiovascular and Mediastinum   Hypertension associated with diabetes (HCC)   Relevant Medications   amLODipine  (NORVASC ) 5 MG tablet   Other Relevant Orders   Comprehensive metabolic panel with GFR     Endocrine   Type 2 diabetes mellitus with hyperglycemia, without long-term current use of insulin (HCC)   Relevant Orders   Hemoglobin A1c   Hyperlipidemia associated with type 2 diabetes mellitus (HCC)   Relevant Medications   amLODipine  (NORVASC ) 5 MG tablet   Other Relevant Orders   Lipid panel   Other Visit Diagnoses       Contusion of right breast, initial encounter    -  Primary   Relevant Medications   lidocaine  (LIDODERM ) 5 %   traMADol  HCl 25 MG TABS   Other Relevant Orders   US  LIMITED ULTRASOUND INCLUDING AXILLA RIGHT BREAST     Vitamin D  deficiency        Relevant Orders   VITAMIN D  25 Hydroxy (Vit-D Deficiency, Fractures)     Chronic cystitis         Contusion of left eye, subsequent encounter           Meds ordered this encounter  Medications   lidocaine  (LIDODERM ) 5 %    Sig: Place 1 patch onto the skin daily. Remove & Discard patch within 12 hours or as directed by MD    Dispense:  30 patch    Refill:  0   amLODipine  (NORVASC ) 5 MG tablet    Sig: Take 1 tablet (5 mg total) by mouth daily.    Dispense:  90 tablet    Refill:  1   traMADol  HCl 25 MG TABS    Sig: Take 25 mg by mouth every 8 (eight) hours as needed for up to 7 days.    Dispense:  15 tablet    Refill:  0    Return in about 3 months (around 05/22/2024) for chronic Disease mgmt.  Fetterolf DELENA Boom, FNP  I, Crichlow DELENA Boom, FNP, have reviewed all documentation for this visit. The documentation on 02/20/24 for the exam, diagnosis, procedures, and orders are all accurate and complete.

## 2024-02-21 LAB — LIPID PANEL
Chol/HDL Ratio: 2.5 ratio (ref 0.0–4.4)
Cholesterol, Total: 129 mg/dL (ref 100–199)
HDL: 51 mg/dL (ref >39)
LDL Chol Calc (NIH): 66 mg/dL (ref 0–99)
Triglycerides: 54 mg/dL (ref 0–149)
VLDL Cholesterol Cal: 12 mg/dL (ref 5–40)

## 2024-02-21 LAB — COMPREHENSIVE METABOLIC PANEL WITH GFR
ALT: 14 IU/L (ref 0–32)
AST: 13 IU/L (ref 0–40)
Albumin: 4.5 g/dL (ref 3.9–4.9)
Alkaline Phosphatase: 106 IU/L (ref 49–135)
BUN/Creatinine Ratio: 17 (ref 12–28)
BUN: 22 mg/dL (ref 8–27)
Bilirubin Total: 0.5 mg/dL (ref 0.0–1.2)
CO2: 25 mmol/L (ref 20–29)
Calcium: 9.8 mg/dL (ref 8.7–10.3)
Chloride: 105 mmol/L (ref 96–106)
Creatinine, Ser: 1.31 mg/dL — ABNORMAL HIGH (ref 0.57–1.00)
Globulin, Total: 3.2 g/dL (ref 1.5–4.5)
Glucose: 73 mg/dL (ref 70–99)
Potassium: 4.1 mmol/L (ref 3.5–5.2)
Sodium: 143 mmol/L (ref 134–144)
Total Protein: 7.7 g/dL (ref 6.0–8.5)
eGFR: 45 mL/min/1.73 — ABNORMAL LOW (ref >59)

## 2024-02-21 LAB — HEMOGLOBIN A1C
Est. average glucose Bld gHb Est-mCnc: 117 mg/dL
Hgb A1c MFr Bld: 5.7 % — ABNORMAL HIGH (ref 4.8–5.6)

## 2024-02-21 LAB — VITAMIN D 25 HYDROXY (VIT D DEFICIENCY, FRACTURES): Vit D, 25-Hydroxy: 11.2 ng/mL — ABNORMAL LOW (ref 30.0–100.0)

## 2024-02-23 ENCOUNTER — Ambulatory Visit: Payer: Self-pay | Admitting: Family Medicine

## 2024-02-23 ENCOUNTER — Other Ambulatory Visit: Payer: Self-pay | Admitting: Family Medicine

## 2024-02-23 DIAGNOSIS — E559 Vitamin D deficiency, unspecified: Secondary | ICD-10-CM

## 2024-02-23 MED ORDER — VITAMIN D (ERGOCALCIFEROL) 1.25 MG (50000 UNIT) PO CAPS: 50000.0000 [IU] | ORAL_CAPSULE | ORAL | 2 refills | Status: AC

## 2024-02-25 ENCOUNTER — Other Ambulatory Visit: Payer: Self-pay | Admitting: Family Medicine

## 2024-02-25 DIAGNOSIS — N63 Unspecified lump in unspecified breast: Secondary | ICD-10-CM

## 2024-02-25 DIAGNOSIS — S2001XA Contusion of right breast, initial encounter: Secondary | ICD-10-CM

## 2024-02-25 DIAGNOSIS — Z1231 Encounter for screening mammogram for malignant neoplasm of breast: Secondary | ICD-10-CM

## 2024-03-04 ENCOUNTER — Inpatient Hospital Stay
Admission: RE | Admit: 2024-03-04 | Discharge: 2024-03-04 | Disposition: A | Payer: Self-pay | Source: Ambulatory Visit | Attending: Family Medicine

## 2024-03-04 ENCOUNTER — Inpatient Hospital Stay
Admission: RE | Admit: 2024-03-04 | Discharge: 2024-03-04 | Disposition: A | Discharge status: Home / Self Care | Payer: Self-pay | Source: Ambulatory Visit | Attending: Family Medicine | Admitting: Family Medicine

## 2024-03-04 ENCOUNTER — Other Ambulatory Visit: Payer: Self-pay | Admitting: *Deleted

## 2024-03-04 DIAGNOSIS — Z1231 Encounter for screening mammogram for malignant neoplasm of breast: Secondary | ICD-10-CM

## 2024-04-05 ENCOUNTER — Other Ambulatory Visit: Payer: Self-pay | Admitting: Family Medicine

## 2024-04-05 DIAGNOSIS — E1159 Type 2 diabetes mellitus with other circulatory complications: Secondary | ICD-10-CM

## 2024-04-06 ENCOUNTER — Ambulatory Visit: Admitting: Physician Assistant

## 2024-04-06 DIAGNOSIS — R350 Frequency of micturition: Secondary | ICD-10-CM

## 2024-04-13 ENCOUNTER — Ambulatory Visit: Admitting: Physician Assistant

## 2024-04-13 VITALS — BP 128/74 | HR 86 | Ht 69.0 in | Wt 195.0 lb

## 2024-04-13 DIAGNOSIS — R350 Frequency of micturition: Secondary | ICD-10-CM | POA: Diagnosis not present

## 2024-04-13 DIAGNOSIS — N390 Urinary tract infection, site not specified: Secondary | ICD-10-CM | POA: Diagnosis not present

## 2024-04-13 LAB — URINALYSIS, COMPLETE
Bilirubin, UA: NEGATIVE
Glucose, UA: NEGATIVE
Ketones, UA: NEGATIVE
Nitrite, UA: NEGATIVE
Specific Gravity, UA: 1.015 (ref 1.005–1.030)
Urobilinogen, Ur: 0.2 mg/dL (ref 0.2–1.0)
pH, UA: 6 (ref 5.0–7.5)

## 2024-04-13 LAB — MICROSCOPIC EXAMINATION
RBC, Urine: >30 /HPF — AB (ref 0–2)
WBC, UA: >30 /HPF — AB (ref 0–5)

## 2024-04-13 LAB — BLADDER SCAN AMB NON-IMAGING

## 2024-04-13 MED ORDER — AMOXICILLIN-POT CLAVULANATE 875-125 MG PO TABS: 1.0000 | ORAL_TABLET | Freq: Two times a day (BID) | ORAL | 0 refills | Status: AC

## 2024-04-13 NOTE — Progress Notes (Signed)
 04/13/2024 1:38 PM   Emily Stokes 1960-12-29 [id number]  CC: Chief Complaint  Patient presents with   Urinary Frequency   HPI: Emily Stokes is a 63 y.o. female with PMH nephrolithiasis, cystocele, recurrent UTI, and OAB wet who presents today for evaluation of possible UTI.   Today she reports about 2 weeks of increased frequency, urgency, and urge incontinence as well as malodorous urine and dysuria.  She denies fevers.  No stones seen on CT renal scan from March 2024.  In-office UA today positive for 1+ protein, 3+ blood, and 2+ leukocytes; urine microscopy with >30 WBCs/HPF, >30 RBCs/HPF, and many bacteria.  PVR 55 mL.  PMH: Past Medical History:  Diagnosis Date   Abdominal pain, epigastric    Acute cystitis without hematuria 04/19/2021   Anemia    Backache    Bronchitis    Calculus, kidney    Carpal tunnel syndrome    Cellulitis of face 05/30/2016   Chest pain    Chicken pox    Circulatory disease    Diabetes mellitus without complication (HCC)    Disorder of kidney and ureter    Excess, menstruation    Hypercholesteremia    Hypertension, essential, benign    Infected postoperative seroma    Infected surgical wound 09/15/2008   Iron deficiency    Leg pain 02/02/2015   Malaise and fatigue    Measles    MRSA (methicillin resistant staph aureus) culture positive    Nausea alone    Nonspecific abnormal electrocardiogram (ECG) (EKG)    Pain in elbow 02/05/2016   Pain in joint, lower leg    Pain in shoulder 12/05/2011   Sleep apnea     Surgical History: Past Surgical History:  Procedure Laterality Date   ABDOMINAL HYSTERECTOMY     CESAREAN SECTION     COLONOSCOPY  1997   COLONOSCOPY WITH PROPOFOL  N/A 01/09/2018   Procedure: COLONOSCOPY WITH PROPOFOL ;  Surgeon: Janalyn Keene NOVAK, MD;  Location: [facility] ENDOSCOPY;  Service: Endoscopy;  Laterality: N/A;   COLONOSCOPY WITH PROPOFOL  N/A 08/20/2019   Procedure: COLONOSCOPY WITH PROPOFOL ;  Surgeon: Jinny Carmine, MD;  Location: [facility] ENDOSCOPY;  Service: Endoscopy;  Laterality: N/A;   COLOSTOMY TAKEDOWN  2012   Hartmann's procedure.     HERNIA REPAIR  2012   umbilical   LITHOTRIPSY  1997   with complications, hospitalized due to these complications for 3 months   mrsa     removal on nose   PARTIAL HYSTERECTOMY  2009    vaginal hysterectomy, has both ovaries    Home Medications:  Allergies as of 04/13/2024       Reactions   Sulfamethoxazole -trimethoprim  Swelling, Hives   Ciprofloxacin  Itching, Rash   Swollen around eyes and mouth.         Medication List        Accurate as of April 13, 2024  1:38 PM. If you have any questions, ask your nurse or doctor.          STOP taking these medications    cephALEXin  250 MG capsule Commonly known as: KEFLEX        TAKE these medications    acetaminophen  500 MG tablet Commonly known as: TYLENOL  Take 500 mg by mouth every 6 (six) hours as needed.   amLODipine  5 MG tablet Commonly known as: NORVASC  Take 1 tablet (5 mg total) by mouth daily.   aspirin  EC 81 MG tablet Commonly known as: Aspirin  Low Dose TAKE ONE TABLET  BY MOUTH ONCE DAILY swallow whole   Blood Glucose Monitoring Suppl Devi 1 each by Does not apply route daily in the afternoon. May substitute to any manufacturer covered by patient's insurance.   BLOOD GLUCOSE TEST STRIPS Strp Use to check fasting blood sugar daily. May substitute to any manufacturer covered by patient's insurance.   gabapentin  300 MG capsule Commonly known as: Neurontin  Take 3 capsules (900 mg total) by mouth 3 (three) times daily.   lidocaine  5 % Commonly known as: Lidoderm  Place 1 patch onto the skin daily. Remove & Discard patch within 12 hours or as directed by MD   metFORMIN  500 MG tablet Commonly known as: GLUCOPHAGE  Take 1 tablet (500 mg total) by mouth daily with breakfast.   OneTouch Delica Plus Lancet33G Misc USE TO check blood glucose three times daily AS DIRECTED    rosuvastatin  40 MG tablet Commonly known as: CRESTOR  Take 1 tablet (40 mg total) by mouth daily.   telmisartan  20 MG tablet Commonly known as: MICARDIS  TAKE 1 TABLET BY MOUTH EVERY DAY   Trulicity  3 MG/0.5ML Soaj Generic drug: Dulaglutide  Inject 3 mg into the skin once a week. INJECT 3MG  INTO THE SKIN ONCE EACH WEEK   Vitamin D  (Ergocalciferol ) 1.25 MG (50000 UNIT) Caps capsule Commonly known as: DRISDOL  Take 1 capsule (50,000 Units total) by mouth every 7 (seven) days.        Allergies:  Allergies  Allergen Reactions   Sulfamethoxazole -Trimethoprim  Swelling and Hives   Ciprofloxacin  Itching and Rash    Swollen around eyes and mouth.     Family History: Family History  Problem Relation Age of Onset   Diabetes Mother    Hypertension Mother    Hypertension Father    Stroke Father    Diabetes Other        sibling   Diabetes Other        sibling   Diabetes Other        sibling   Hypertension Other        sibling   Hypertension Other        sibling   Hypertension Other        sibling    Social History:   reports that she has never smoked. She has never used smokeless tobacco. She reports that she does not drink alcohol and does not use drugs.  Physical Exam: BP 128/74   Pulse 86   Ht 5' 9 (1.753 m)   Wt 195 lb (88.5 kg)   SpO2 93%   BMI 28.80 kg/m   Constitutional:  Alert and oriented, no acute distress, nontoxic appearing HEENT: , AT Cardiovascular: No clubbing, cyanosis, or edema Respiratory: Normal respiratory effort, no increased work of breathing Skin: No rashes, bruises or suspicious lesions Neurologic: Grossly intact, no focal deficits, moving all 4 extremities Psychiatric: Normal mood and affect  Laboratory Data: Results for orders placed or performed in visit on 04/13/24  Microscopic Examination   Collection Time: 04/13/24  1:07 PM   Urine  Result Value Ref Range   WBC, UA >30 (A) 0 - 5 /hpf   RBC, Urine >30 (A) 0 - 2 /hpf    Epithelial Cells (non renal) 0-10 0 - 10 /hpf   Bacteria, UA Many (A) None seen/Few  Urinalysis, Complete   Collection Time: 04/13/24  1:07 PM  Result Value Ref Range   Specific Gravity, UA 1.015 1.005 - 1.030   pH, UA 6.0 5.0 - 7.5   Color, UA Yellow Yellow  Appearance Ur Cloudy (A) Clear   Leukocytes,UA 2+ (A) Negative   Protein,UA 1+ (A) Negative/Trace   Glucose, UA Negative Negative   Ketones, UA Negative Negative   RBC, UA 3+ (A) Negative   Bilirubin, UA Negative Negative   Urobilinogen, Ur 0.2 0.2 - 1.0 mg/dL   Nitrite, UA Negative Negative   Microscopic Examination See below:   Bladder Scan (Post Void Residual) in office   Collection Time: 04/13/24  1:15 PM  Result Value Ref Range   Scan Result 55ml    Assessment & Plan:   1. Recurrent UTI (Primary) UA appears grossly infected.  Will start empiric Augmentin  and send for culture for further evaluation.  She is emptying appropriately, and with no stone seen on CT last year I have low suspicion for stone episode at this time. - Urinalysis, Complete - Bladder Scan (Post Void Residual) in office - CULTURE, URINE COMPREHENSIVE - amoxicillin -clavulanate (AUGMENTIN ) 875-125 MG tablet; Take 1 tablet by mouth 2 (two) times daily for 5 days.  Dispense: 10 tablet; Refill: 0   Return if symptoms worsen or fail to improve.  Lucie Hones, PA-C  [name] [place] [address] [place], [place] [address] [phone]

## 2024-04-14 ENCOUNTER — Other Ambulatory Visit

## 2024-04-14 ENCOUNTER — Encounter

## 2024-04-16 LAB — CULTURE, URINE COMPREHENSIVE

## 2024-04-21 ENCOUNTER — Ambulatory Visit
Admission: RE | Admit: 2024-04-21 | Discharge: 2024-04-21 | Disposition: A | Discharge status: Home / Self Care | Source: Ambulatory Visit | Attending: Family Medicine | Admitting: Family Medicine

## 2024-04-21 DIAGNOSIS — S2001XA Contusion of right breast, initial encounter: Secondary | ICD-10-CM | POA: Diagnosis present

## 2024-04-21 DIAGNOSIS — N63 Unspecified lump in unspecified breast: Secondary | ICD-10-CM | POA: Diagnosis present

## 2024-04-21 DIAGNOSIS — Z1231 Encounter for screening mammogram for malignant neoplasm of breast: Secondary | ICD-10-CM | POA: Insufficient documentation

## 2024-05-05 ENCOUNTER — Other Ambulatory Visit: Payer: Self-pay | Admitting: Family Medicine

## 2024-05-05 DIAGNOSIS — E119 Type 2 diabetes mellitus without complications: Secondary | ICD-10-CM

## 2024-05-07 NOTE — Telephone Encounter (Signed)
 Requested Prescriptions  Pending Prescriptions Disp Refills   aspirin  EC (ASPIRIN  LOW DOSE) 81 MG tablet [Pharmacy Med Name: ASPIRIN  EC 81 MG TABLET] 90 tablet 0    Sig: TAKE 1 TABLET BY MOUTH ONCE DAILY SWALLOW WHOLE     Analgesics:  NSAIDS - aspirin  Failed - 05/07/2024  1:56 PM      Failed - Cr in normal range and within 360 days    Creatinine  Date Value Ref Range Status  03/25/2014 1.16 0.60 - 1.30 mg/dL Final   Creatinine, Ser  Date Value Ref Range Status  02/20/2024 1.31 (H) 0.57 - 1.00 mg/dL Final         Passed - eGFR is 10 or above and within 360 days    EGFR (African American)  Date Value Ref Range Status  03/25/2014 >60 >27mL/min Final   GFR calc Af Amer  Date Value Ref Range Status  06/06/2020 63 >59 mL/min/1.73 Final    Comment:    **In accordance with recommendations from the NKF-ASN Task force,**   [facility] is in the process of updating its eGFR calculation to the   2021 CKD-EPI creatinine equation that estimates kidney function   without a race variable.    EGFR (Non-African Amer.)  Date Value Ref Range Status  03/25/2014 52 (L) >71mL/min Final    Comment:    eGFR values <34mL/min/1.73 m2 may be an indication of chronic kidney disease (CKD). Calculated eGFR, using the MRDR Study equation, is useful in  patients with stable renal function. The eGFR calculation will not be reliable in acutely ill patients when serum creatinine is changing rapidly. It is not useful in patients on dialysis. The eGFR calculation may not be applicable to patients at the low and high extremes of body sizes, pregnant women, and vegetarians.    GFR calc non Af Amer  Date Value Ref Range Status  06/06/2020 55 (L) >59 mL/min/1.73 Final   eGFR  Date Value Ref Range Status  02/20/2024 45 (L) >59 mL/min/1.73 Final         Passed - Patient is not pregnant      Passed - Valid encounter within last 12 months    Recent Outpatient Visits           2 months ago Contusion of  right breast, initial encounter   [facility] [place], [place] LABOR, FNP   6 months ago Preoperative examination   [facility] [place] [facility], [place], MD   9 months ago Hypertension associated with diabetes [facility])   [facility] [place], [place] LABOR, FNP       Future Appointments             In 6 months MacDiarmid, [place], MD [facility]

## 2024-05-10 ENCOUNTER — Other Ambulatory Visit: Payer: Self-pay | Admitting: Family Medicine

## 2024-05-10 ENCOUNTER — Other Ambulatory Visit: Payer: Self-pay | Admitting: Physician Assistant

## 2024-05-10 DIAGNOSIS — S2001XA Contusion of right breast, initial encounter: Secondary | ICD-10-CM

## 2024-05-10 DIAGNOSIS — N390 Urinary tract infection, site not specified: Secondary | ICD-10-CM

## 2024-05-10 NOTE — Telephone Encounter (Addendum)
 [facility] faxed refill request for the following medications:  lidocaine  (LIDODERM ) 5 %   Tramadol  HCL 50mg  tablet   Please advise.

## 2024-05-25 ENCOUNTER — Ambulatory Visit: Admitting: Family Medicine

## 2024-05-26 ENCOUNTER — Ambulatory Visit: Payer: Self-pay

## 2024-05-26 VITALS — Ht 69.0 in | Wt 195.0 lb

## 2024-05-26 DIAGNOSIS — Z1211 Encounter for screening for malignant neoplasm of colon: Secondary | ICD-10-CM

## 2024-05-26 DIAGNOSIS — Z0000 Encounter for general adult medical examination without abnormal findings: Secondary | ICD-10-CM

## 2024-05-26 NOTE — Patient Instructions (Signed)
 [name],  Thank you for taking the time for your Medicare Wellness Visit. I appreciate your continued commitment to your health goals. Please review the care plan we discussed, and feel free to reach out if I can assist you further.  Please note that Annual Wellness Visits do not include a physical exam. Some assessments may be limited, especially if the visit was conducted virtually. If needed, we may recommend an in-person follow-up with your provider.  Ongoing Care Seeing your primary care provider every 3 to 6 months helps us  monitor your health and provide consistent, personalized care.   Referrals If a referral was made during today's visit and you haven't received any updates within two weeks, please contact the referred provider directly to check on the status.  Recommended Screenings:  Health Maintenance  Topic Date Due   DTaP/Tdap/Td vaccine (1 - Tdap) Never done   Zoster (Shingles) Vaccine (1 of 2) Never done   Pneumococcal Vaccine for age over 36 (2 of 2 - PCV) 01/31/2019   Eye exam for diabetics  09/14/2020   Flu Shot  12/12/2023   COVID-19 Vaccine (3 - 2025-26 season) 01/12/2024   Yearly kidney health urinalysis for diabetes  04/02/2024   Colon Cancer Screening  08/19/2024   Complete foot exam   07/20/2024   Hemoglobin A1C  08/20/2024   Yearly kidney function blood test for diabetes  02/19/2025   Breast Cancer Screening  04/21/2025   Medicare Annual Wellness Visit  05/26/2025   Hepatitis C Screening  Completed   HIV Screening  Completed   Hepatitis B Vaccine  Aged Out   HPV Vaccine  Aged Out   Meningitis B Vaccine  Aged Out       05/26/2024    3:10 PM  Advanced Directives  Does Patient Have a Medical Advance Directive? No  Would patient like information on creating a medical advance directive? No - Patient declined    Vision: Annual vision screenings are recommended for early detection of glaucoma, cataracts, and diabetic retinopathy. These exams can also  reveal signs of chronic conditions such as diabetes and high blood pressure.  Dental: Annual dental screenings help detect early signs of oral cancer, gum disease, and other conditions linked to overall health, including heart disease and diabetes.  Please see the attached documents for additional preventive care recommendations.    [name] , Thank you for taking time to come for your Medicare Wellness Visit. I appreciate your ongoing commitment to your health goals. Please review the following plan we discussed and let me know if I can assist you in the future.   Screening recommendations/referrals: Colonoscopy:  Mammogram:  Bone Density:  Recommended yearly ophthalmology/optometry visit for glaucoma screening and checkup Recommended yearly dental visit for hygiene and checkup  Vaccinations: Influenza vaccine:  Pneumococcal vaccine:  Tdap vaccine:  Shingles vaccine:        Preventive Care 65 Years and Older, Female Preventive care refers to lifestyle choices and visits with your health care provider that can promote health and wellness. What does preventive care include? A yearly physical exam. This is also called an annual well check. Dental exams once or twice a year. Routine eye exams. Ask your health care provider how often you should have your eyes checked. Personal lifestyle choices, including: Daily care of your teeth and gums. Regular physical activity. Eating a healthy diet. Avoiding tobacco and drug use. Limiting alcohol use. Practicing safe sex. Taking low-dose aspirin  every day. Taking vitamin and mineral  supplements as recommended by your health care provider. What happens during an annual well check? The services and screenings done by your health care provider during your annual well check will depend on your age, overall health, lifestyle risk factors, and family history of disease. Counseling  Your health care provider may ask you questions about  your: Alcohol use. Tobacco use. Drug use. Emotional well-being. Home and relationship well-being. Sexual activity. Eating habits. History of falls. Memory and ability to understand (cognition). Work and work astronomer. Reproductive health. Screening  You may have the following tests or measurements: Height, weight, and BMI. Blood pressure. Lipid and cholesterol levels. These may be checked every 5 years, or more frequently if you are over 49 years old. Skin check. Lung cancer screening. You may have this screening every year starting at age 22 if you have a 30-pack-year history of smoking and currently smoke or have quit within the past 15 years. Fecal occult blood test (FOBT) of the stool. You may have this test every year starting at age 44. Flexible sigmoidoscopy or colonoscopy. You may have a sigmoidoscopy every 5 years or a colonoscopy every 10 years starting at age 94. Hepatitis C blood test. Hepatitis B blood test. Sexually transmitted disease (STD) testing. Diabetes screening. This is done by checking your blood sugar (glucose) after you have not eaten for a while (fasting). You may have this done every 1-3 years. Bone density scan. This is done to screen for osteoporosis. You may have this done starting at age 57. Mammogram. This may be done every 1-2 years. Talk to your health care provider about how often you should have regular mammograms. Talk with your health care provider about your test results, treatment options, and if necessary, the need for more tests. Vaccines  Your health care provider may recommend certain vaccines, such as: Influenza vaccine. This is recommended every year. Tetanus, diphtheria, and acellular pertussis (Tdap, Td) vaccine. You may need a Td booster every 10 years. Zoster vaccine. You may need this after age 44. Pneumococcal 13-valent conjugate (PCV13) vaccine. One dose is recommended after age 16. Pneumococcal polysaccharide (PPSV23) vaccine.  One dose is recommended after age 45. Talk to your health care provider about which screenings and vaccines you need and how often you need them. This information is not intended to replace advice given to you by your health care provider. Make sure you discuss any questions you have with your health care provider. Document Released: 05/26/2015 Document Revised: 01/17/2016 Document Reviewed: 02/28/2015 Elsevier Interactive Patient Education  2017 [employer].  Fall Prevention in the Home Falls can cause injuries. They can happen to people of all ages. There are many things you can do to make your home safe and to help prevent falls. What can I do on the outside of my home? Regularly fix the edges of walkways and driveways and fix any cracks. Remove anything that might make you trip as you walk through a door, such as a raised step or threshold. Trim any bushes or trees on the path to your home. Use bright outdoor lighting. Clear any walking paths of anything that might make someone trip, such as rocks or tools. Regularly check to see if handrails are loose or broken. Make sure that both sides of any steps have handrails. Any raised decks and porches should have guardrails on the edges. Have any leaves, snow, or ice cleared regularly. Use sand or salt on walking paths during winter. Clean up any spills in your  garage right away. This includes oil or grease spills. What can I do in the bathroom? Use night lights. Install grab bars by the toilet and in the tub and shower. Do not use towel bars as grab bars. Use non-skid mats or decals in the tub or shower. If you need to sit down in the shower, use a plastic, non-slip stool. Keep the floor dry. Clean up any water that spills on the floor as soon as it happens. Remove soap buildup in the tub or shower regularly. Attach bath mats securely with double-sided non-slip rug tape. Do not have throw rugs and other things on the floor that can make  you trip. What can I do in the bedroom? Use night lights. Make sure that you have a light by your bed that is easy to reach. Do not use any sheets or blankets that are too big for your bed. They should not hang down onto the floor. Have a firm chair that has side arms. You can use this for support while you get dressed. Do not have throw rugs and other things on the floor that can make you trip. What can I do in the kitchen? Clean up any spills right away. Avoid walking on wet floors. Keep items that you use a lot in easy-to-reach places. If you need to reach something above you, use a strong step stool that has a grab bar. Keep electrical cords out of the way. Do not use floor polish or wax that makes floors slippery. If you must use wax, use non-skid floor wax. Do not have throw rugs and other things on the floor that can make you trip. What can I do with my stairs? Do not leave any items on the stairs. Make sure that there are handrails on both sides of the stairs and use them. Fix handrails that are broken or loose. Make sure that handrails are as long as the stairways. Check any carpeting to make sure that it is firmly attached to the stairs. Fix any carpet that is loose or worn. Avoid having throw rugs at the top or bottom of the stairs. If you do have throw rugs, attach them to the floor with carpet tape. Make sure that you have a light switch at the top of the stairs and the bottom of the stairs. If you do not have them, ask someone to add them for you. What else can I do to help prevent falls? Wear shoes that: Do not have high heels. Have rubber bottoms. Are comfortable and fit you well. Are closed at the toe. Do not wear sandals. If you use a stepladder: Make sure that it is fully opened. Do not climb a closed stepladder. Make sure that both sides of the stepladder are locked into place. Ask someone to hold it for you, if possible. Clearly mark and make sure that you can  see: Any grab bars or handrails. First and last steps. Where the edge of each step is. Use tools that help you move around (mobility aids) if they are needed. These include: Canes. Walkers. Scooters. Crutches. Turn on the lights when you go into a dark area. Replace any light bulbs as soon as they burn out. Set up your furniture so you have a clear path. Avoid moving your furniture around. If any of your floors are uneven, fix them. If there are any pets around you, be aware of where they are. Review your medicines with your doctor. Some medicines can  make you feel dizzy. This can increase your chance of falling. Ask your doctor what other things that you can do to help prevent falls. This information is not intended to replace advice given to you by your health care provider. Make sure you discuss any questions you have with your health care provider. Document Released: 02/23/2009 Document Revised: 10/05/2015 Document Reviewed: 06/03/2014 Elsevier Interactive Patient Education  2017 [employer].

## 2024-05-26 NOTE — Progress Notes (Unsigned)
 "  Chief Complaint  Patient presents with   Medicare Wellness     Subjective:   Emily Stokes is a 64 y.o. female who presents for a Medicare Annual Wellness Visit.  No voiced or noted concerns at this time   Visit info / Clinical Intake: Medicare Wellness Visit Type:: Subsequent Annual Wellness Visit Persons participating in visit and providing information:: patient Medicare Wellness Visit Mode:: Video Since this visit was completed virtually, some vitals may be partially provided or unavailable. Missing vitals are due to the limitations of the virtual format.: Unable to obtain vitals - no equipment If Telephone or Video please confirm:: I connected with patient using audio/video enable telemedicine. I verified patient identity with two identifiers, discussed telehealth limitations, and patient agreed to proceed. Patient Location:: home Provider Location:: home Interpreter Needed?: No Pre-visit prep was completed: no AWV questionnaire completed by patient prior to visit?: no Living arrangements:: with family/others Patient's Overall Health Status Rating: (!) fair Typical amount of pain: some Does pain affect daily life?: no Are you currently prescribed opioids?: no  Dietary Habits and Nutritional Risks How many meals a day?: 2 Eats fruit and vegetables daily?: yes Most meals are obtained by: preparing own meals In the last 2 weeks, have you had any of the following?: none Diabetic:: (!) yes Any non-healing wounds?: no How often do you check your BS?: 1 Would you like to be referred to a Nutritionist or for Diabetic Management? : no  Functional Status Activities of Daily Living (to include ambulation/medication): Independent Ambulation: Independent Medication Administration: Independent Home Management (perform basic housework or laundry): Independent Manage your own finances?: yes Primary transportation is: driving Concerns about vision?: no *vision screening is  required for WTM* Concerns about hearing?: no  Fall Screening Falls in the past year?: 1 Number of falls in past year: 0 Was there an injury with Fall?: 1 Fall Risk Category Calculator: 2 Patient Fall Risk Level: Moderate Fall Risk  Fall Risk Patient at Risk for Falls Due to: No Fall Risks Fall risk Follow up: Falls evaluation completed; Education provided; Falls prevention discussed  Home and Transportation Safety: All rugs have non-skid backing?: (!) no All stairs or steps have railings?: yes Grab bars in the bathtub or shower?: (!) no Have non-skid surface in bathtub or shower?: (!) no Good home lighting?: yes Regular seat belt use?: yes Hospital stays in the last year:: no  Cognitive Assessment Difficulty concentrating, remembering, or making decisions? : no Will 6CIT or Mini Cog be Completed: yes What year is it?: 0 points What month is it?: 0 points Give patient an address phrase to remember (5 components): its very sunny outside today in January About what time is it?: 0 points Count backwards from 20 to 1: 0 points Say the months of the year in reverse: 0 points Repeat the address phrase from earlier: 0 points 6 CIT Score: 0 points  Advance Directives (For Healthcare) Does Patient Have a Medical Advance Directive?: No Would patient like information on creating a medical advance directive?: No - Patient declined  Reviewed/Updated  Reviewed/Updated: Reviewed All (Medical, Surgical, Family, Medications, Allergies, Care Teams, Patient Goals); Surgical History; Family History; Medications; Allergies; Care Teams; Patient Goals; Medical History    Allergies (verified) Sulfamethoxazole -trimethoprim  and Ciprofloxacin    Current Medications (verified) Outpatient Encounter Medications as of 05/26/2024  Medication Sig   acetaminophen  (TYLENOL ) 500 MG tablet Take 500 mg by mouth every 6 (six) hours as needed.   amLODipine  (NORVASC ) 5 MG tablet  Take 1 tablet (5 mg total) by  mouth daily.   aspirin  EC (ASPIRIN  LOW DOSE) 81 MG tablet TAKE 1 TABLET BY MOUTH ONCE DAILY SWALLOW WHOLE   Blood Glucose Monitoring Suppl DEVI 1 each by Does not apply route daily in the afternoon. May substitute to any manufacturer covered by patient's insurance.   cephALEXin  (KEFLEX ) 250 MG capsule Take 250 mg by mouth daily.   Dulaglutide  (TRULICITY ) 3 MG/0.5ML SOAJ Inject 3 mg into the skin once a week. INJECT 3MG  INTO THE SKIN ONCE EACH WEEK   gabapentin  (NEURONTIN ) 300 MG capsule Take 3 capsules (900 mg total) by mouth 3 (three) times daily.   Glucose Blood (BLOOD GLUCOSE TEST STRIPS) STRP Use to check fasting blood sugar daily. May substitute to any manufacturer covered by patient's insurance.   Lancets (ONETOUCH DELICA PLUS LANCET33G) MISC USE TO check blood glucose three times daily AS DIRECTED   lidocaine  (LIDODERM ) 5 % Place 1 patch onto the skin daily. Remove & Discard patch within 12 hours or as directed by MD   metFORMIN  (GLUCOPHAGE ) 500 MG tablet Take 1 tablet (500 mg total) by mouth daily with breakfast.   rosuvastatin  (CRESTOR ) 40 MG tablet Take 1 tablet (40 mg total) by mouth daily.   telmisartan  (MICARDIS ) 20 MG tablet TAKE 1 TABLET BY MOUTH EVERY DAY   Vitamin D , Ergocalciferol , (DRISDOL ) 1.25 MG (50000 UNIT) CAPS capsule Take 1 capsule (50,000 Units total) by mouth every 7 (seven) days.   No facility-administered encounter medications on file as of 05/26/2024.    History: Past Medical History:  Diagnosis Date   Abdominal pain, epigastric    Acute cystitis without hematuria 04/19/2021   Anemia    Backache    Bronchitis    Calculus, kidney    Carpal tunnel syndrome    Cellulitis of face 05/30/2016   Chest pain    Chicken pox    Circulatory disease    Diabetes mellitus without complication (HCC)    Disorder of kidney and ureter    Excess, menstruation    Hypercholesteremia    Hypertension, essential, benign    Infected postoperative seroma    Infected surgical  wound 09/15/2008   Iron deficiency    Leg pain 02/02/2015   Malaise and fatigue    Measles    MRSA (methicillin resistant staph aureus) culture positive    Nausea alone    Nonspecific abnormal electrocardiogram (ECG) (EKG)    Pain in elbow 02/05/2016   Pain in joint, lower leg    Pain in shoulder 12/05/2011   Sleep apnea    Past Surgical History:  Procedure Laterality Date   ABDOMINAL HYSTERECTOMY     BREAST BIOPSY Right    Benign per pt   CESAREAN SECTION     COLONOSCOPY  1997   COLONOSCOPY WITH PROPOFOL  N/A 01/09/2018   Procedure: COLONOSCOPY WITH PROPOFOL ;  Surgeon: Janalyn Keene NOVAK, MD;  Location: [facility] ENDOSCOPY;  Service: Endoscopy;  Laterality: N/A;   COLONOSCOPY WITH PROPOFOL  N/A 08/20/2019   Procedure: COLONOSCOPY WITH PROPOFOL ;  Surgeon: Jinny Carmine, MD;  Location: [facility] ENDOSCOPY;  Service: Endoscopy;  Laterality: N/A;   COLOSTOMY TAKEDOWN  2012   Hartmann's procedure.     HERNIA REPAIR  2012   umbilical   LITHOTRIPSY  1997   with complications, hospitalized due to these complications for 3 months   mrsa     removal on nose   PARTIAL HYSTERECTOMY  2009    vaginal hysterectomy, has both ovaries  Family History  Problem Relation Age of Onset   Diabetes Mother    Hypertension Mother    Hypertension Father    Stroke Father    Diabetes Other        sibling   Diabetes Other        sibling   Diabetes Other        sibling   Hypertension Other        sibling   Hypertension Other        sibling   Hypertension Other        sibling   Breast cancer Neg Hx    Social History   Occupational History   Occupation: Geologist, engineering    Comment: disability   Occupation: disability  Tobacco Use   Smoking status: Never   Smokeless tobacco: Never  Vaping Use   Vaping status: Never Used  Substance and Sexual Activity   Alcohol use: Never   Drug use: Never   Sexual activity: Never   Tobacco Counseling Counseling given: Not Answered  SDOH Screenings    Food Insecurity: No Food Insecurity (05/26/2024)  Housing: High Risk (05/26/2024)  Transportation Needs: No Transportation Needs (05/26/2024)  Utilities: Not At Risk (05/26/2024)  Alcohol Screen: Low Risk (05/21/2023)  Depression (PHQ2-9): Low Risk (05/26/2024)  Financial Resource Strain: Medium Risk (09/30/2023)   Received from [facility]  Physical Activity: Inactive (05/26/2024)  Social Connections: Moderately Integrated (05/26/2024)  Stress: No Stress Concern Present (05/26/2024)  Tobacco Use: Low Risk (05/26/2024)  Health Literacy: Adequate Health Literacy (05/26/2024)   See flowsheets for full screening details  Depression Screen PHQ 2 & 9 Depression Scale- Over the past 2 weeks, how often have you been bothered by any of the following problems? Little interest or pleasure in doing things: 0 Feeling down, depressed, or hopeless (PHQ Adolescent also includes...irritable): 0 PHQ-2 Total Score: 0 Trouble falling or staying asleep, or sleeping too much: 0 Feeling tired or having little energy: 0 Poor appetite or overeating (PHQ Adolescent also includes...weight loss): 0 Feeling bad about yourself - or that you are a failure or have let yourself or your family down: 0 Trouble concentrating on things, such as reading the newspaper or watching television (PHQ Adolescent also includes...like school work): 0 Moving or speaking so slowly that other people could have noticed. Or the opposite - being so fidgety or restless that you have been moving around a lot more than usual: 0 Thoughts that you would be better off dead, or of hurting yourself in some way: 0 PHQ-9 Total Score: 0 If you checked off any problems, how difficult have these problems made it for you to do your work, take care of things at home, or get along with other people?: Not difficult at all     Goals Addressed             This Visit's Progress    Patient Stated       Would like to be able to walk  better             Objective:    Today's Vitals   05/26/24 1515  Weight: 195 lb (88.5 kg)  Height: 5' 9 (1.753 m)   Body mass index is 28.8 kg/m.  Hearing/Vision screen Hearing Screening - Comments:: No trouble hearing Vision Screening - Comments:: Up to date [facility] Immunizations and Health Maintenance Health Maintenance  Topic Date Due   DTaP/Tdap/Td (1 - Tdap) Never done   Zoster Vaccines-  Shingrix (1 of 2) Never done   Pneumococcal Vaccine: 50+ Years (2 of 2 - PCV) 01/31/2019   OPHTHALMOLOGY EXAM  09/14/2020   Influenza Vaccine  12/12/2023   COVID-19 Vaccine (3 - 2025-26 season) 01/12/2024   Diabetic kidney evaluation - Urine ACR  04/02/2024   Colonoscopy  08/19/2024   FOOT EXAM  07/20/2024   HEMOGLOBIN A1C  08/20/2024   Diabetic kidney evaluation - eGFR measurement  02/19/2025   Mammogram  04/21/2025   Medicare Annual Wellness (AWV)  05/26/2025   Hepatitis C Screening  Completed   HIV Screening  Completed   Hepatitis B Vaccines 19-59 Average Risk  Aged Out   HPV VACCINES  Aged Out   Meningococcal B Vaccine  Aged Out        Assessment/Plan:  This is a routine wellness examination for Emily Stokes.  Patient Care Team: Wellington Helderman LABOR, FNP (Inactive) as PCP - General (Family Medicine) [place], [facility] (Optometry) Marcelino Nurse, MD as Consulting Physician (Pain Medicine) Gaston Hamilton, MD as Consulting Physician (Urology)  I have personally reviewed and noted the following in the patients chart:   Medical and social history Use of alcohol, tobacco or illicit drugs  Current medications and supplements including opioid prescriptions. Functional ability and status Nutritional status Physical activity Advanced directives List of other physicians Hospitalizations, surgeries, and ER visits in previous 12 months Vitals Screenings to include cognitive, depression, and falls Referrals and appointments  No orders of the defined types were placed  in this encounter.  In addition, I have reviewed and discussed with patient certain preventive protocols, quality metrics, and best practice recommendations. A written personalized care plan for preventive services as well as general preventive health recommendations were provided to patient.   Emily Sutphin, LPN   [id number]   Return in 1 year (on 05/26/2025).  After Visit Summary: (MyChart) Due to this being a telephonic visit, the after visit summary with patients personalized plan was offered to patient via MyChart   Nurse Notes:  "

## 2024-06-15 ENCOUNTER — Other Ambulatory Visit: Payer: Self-pay | Admitting: Family Medicine

## 2024-06-15 DIAGNOSIS — R928 Other abnormal and inconclusive findings on diagnostic imaging of breast: Secondary | ICD-10-CM

## 2024-07-21 ENCOUNTER — Encounter

## 2024-07-21 ENCOUNTER — Other Ambulatory Visit

## 2024-11-15 ENCOUNTER — Ambulatory Visit: Admitting: Urology
